# Patient Record
Sex: Female | Born: 1985 | Race: Black or African American | Hispanic: No | Marital: Single | Smoking: Current every day smoker
Health system: Southern US, Community
[De-identification: ages and names within clinical notes are randomized; demographics above are authoritative.]

## PROBLEM LIST (undated history)

## (undated) ENCOUNTER — Emergency Department (HOSPITAL_COMMUNITY): Admission: EM | Payer: Medicaid Other

## (undated) ENCOUNTER — Inpatient Hospital Stay (HOSPITAL_COMMUNITY): Payer: Medicaid Other

## (undated) DIAGNOSIS — R51 Headache: Secondary | ICD-10-CM

## (undated) DIAGNOSIS — F99 Mental disorder, not otherwise specified: Secondary | ICD-10-CM

## (undated) DIAGNOSIS — E282 Polycystic ovarian syndrome: Secondary | ICD-10-CM

## (undated) DIAGNOSIS — F419 Anxiety disorder, unspecified: Secondary | ICD-10-CM

## (undated) DIAGNOSIS — D4959 Neoplasm of unspecified behavior of other genitourinary organ: Secondary | ICD-10-CM

## (undated) DIAGNOSIS — F319 Bipolar disorder, unspecified: Secondary | ICD-10-CM

## (undated) DIAGNOSIS — L0292 Furuncle, unspecified: Secondary | ICD-10-CM

## (undated) HISTORY — PX: LAPAROSCOPIC UNILATERAL SALPINGECTOMY: SHX5934

---
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
## 2006-08-22 ENCOUNTER — Emergency Department (HOSPITAL_COMMUNITY): Admission: EM | Admit: 2006-08-22 | Discharge: 2006-08-22 | Payer: Self-pay | Admitting: Emergency Medicine

## 2006-09-23 ENCOUNTER — Ambulatory Visit: Payer: Self-pay | Admitting: Obstetrics and Gynecology

## 2006-09-23 ENCOUNTER — Inpatient Hospital Stay (HOSPITAL_COMMUNITY): Admission: AD | Admit: 2006-09-23 | Discharge: 2006-09-24 | Payer: Self-pay | Admitting: Gynecology

## 2006-10-22 ENCOUNTER — Encounter (INDEPENDENT_AMBULATORY_CARE_PROVIDER_SITE_OTHER): Payer: Self-pay | Admitting: Gynecology

## 2006-10-22 ENCOUNTER — Ambulatory Visit: Payer: Self-pay | Admitting: Gynecology

## 2006-10-22 ENCOUNTER — Inpatient Hospital Stay (HOSPITAL_COMMUNITY): Admission: RE | Admit: 2006-10-22 | Discharge: 2006-10-24 | Payer: Self-pay | Admitting: Gynecology

## 2006-10-29 ENCOUNTER — Ambulatory Visit: Payer: Self-pay | Admitting: Family Medicine

## 2007-02-09 ENCOUNTER — Inpatient Hospital Stay (HOSPITAL_COMMUNITY): Admission: AD | Admit: 2007-02-09 | Discharge: 2007-02-09 | Payer: Self-pay | Admitting: Obstetrics & Gynecology

## 2007-05-20 ENCOUNTER — Other Ambulatory Visit: Payer: Self-pay | Admitting: Gynecology

## 2007-05-20 ENCOUNTER — Emergency Department (HOSPITAL_COMMUNITY): Admission: EM | Admit: 2007-05-20 | Discharge: 2007-05-20 | Payer: Self-pay | Admitting: Emergency Medicine

## 2007-07-02 ENCOUNTER — Emergency Department (HOSPITAL_COMMUNITY): Admission: EM | Admit: 2007-07-02 | Discharge: 2007-07-02 | Payer: Self-pay | Admitting: Emergency Medicine

## 2009-03-14 ENCOUNTER — Inpatient Hospital Stay (HOSPITAL_COMMUNITY): Admission: AD | Admit: 2009-03-14 | Discharge: 2009-03-14 | Payer: Self-pay | Admitting: Obstetrics and Gynecology

## 2009-06-19 ENCOUNTER — Emergency Department (HOSPITAL_COMMUNITY): Admission: EM | Admit: 2009-06-19 | Discharge: 2009-06-19 | Payer: Self-pay | Admitting: Family Medicine

## 2009-09-11 ENCOUNTER — Inpatient Hospital Stay (HOSPITAL_COMMUNITY): Admission: AD | Admit: 2009-09-11 | Discharge: 2009-09-11 | Payer: Self-pay | Admitting: Obstetrics & Gynecology

## 2009-09-11 ENCOUNTER — Ambulatory Visit: Payer: Self-pay | Admitting: Nurse Practitioner

## 2009-09-24 ENCOUNTER — Emergency Department (HOSPITAL_BASED_OUTPATIENT_CLINIC_OR_DEPARTMENT_OTHER): Admission: EM | Admit: 2009-09-24 | Discharge: 2009-09-24 | Payer: Self-pay | Admitting: Emergency Medicine

## 2009-12-18 ENCOUNTER — Emergency Department (HOSPITAL_COMMUNITY): Admission: AD | Admit: 2009-12-18 | Discharge: 2009-12-18 | Payer: Self-pay | Admitting: Emergency Medicine

## 2009-12-19 ENCOUNTER — Emergency Department (HOSPITAL_COMMUNITY): Admission: EM | Admit: 2009-12-19 | Discharge: 2009-12-19 | Payer: Self-pay | Admitting: Family Medicine

## 2010-03-24 ENCOUNTER — Encounter: Payer: Self-pay | Admitting: *Deleted

## 2010-05-18 LAB — URINALYSIS, ROUTINE W REFLEX MICROSCOPIC
Glucose, UA: NEGATIVE mg/dL
Ketones, ur: NEGATIVE mg/dL
Nitrite: NEGATIVE
Specific Gravity, Urine: 1.007 (ref 1.005–1.030)
pH: 7 (ref 5.0–8.0)

## 2010-05-18 LAB — POCT TOXICOLOGY PANEL
Cocaine: POSITIVE
Tetrahydrocannabinol: POSITIVE

## 2010-05-18 LAB — COMPREHENSIVE METABOLIC PANEL
Albumin: 4.1 g/dL (ref 3.5–5.2)
BUN: 6 mg/dL (ref 6–23)
Calcium: 9.3 mg/dL (ref 8.4–10.5)
Creatinine, Ser: 1 mg/dL (ref 0.4–1.2)
Glucose, Bld: 94 mg/dL (ref 70–99)
Potassium: 3.9 mEq/L (ref 3.5–5.1)
Total Protein: 8.1 g/dL (ref 6.0–8.3)

## 2010-05-18 LAB — CBC
HCT: 50.2 % — ABNORMAL HIGH (ref 36.0–46.0)
MCH: 32.7 pg (ref 26.0–34.0)
MCHC: 33.8 g/dL (ref 30.0–36.0)
MCV: 96.8 fL (ref 78.0–100.0)
Platelets: 237 10*3/uL (ref 150–400)
RDW: 11.9 % (ref 11.5–15.5)
WBC: 6.3 10*3/uL (ref 4.0–10.5)

## 2010-05-18 LAB — PREGNANCY, URINE: Preg Test, Ur: NEGATIVE

## 2010-05-18 LAB — WET PREP, GENITAL: Trich, Wet Prep: NONE SEEN

## 2010-05-18 LAB — GC/CHLAMYDIA PROBE AMP, GENITAL: Chlamydia, DNA Probe: NEGATIVE

## 2010-05-19 LAB — URINE MICROSCOPIC-ADD ON

## 2010-05-19 LAB — URINALYSIS, ROUTINE W REFLEX MICROSCOPIC
Bilirubin Urine: NEGATIVE
Glucose, UA: NEGATIVE mg/dL
Ketones, ur: NEGATIVE mg/dL
Protein, ur: NEGATIVE mg/dL
pH: 6 (ref 5.0–8.0)

## 2010-05-19 LAB — GC/CHLAMYDIA PROBE AMP, GENITAL
Chlamydia, DNA Probe: NEGATIVE
GC Probe Amp, Genital: NEGATIVE

## 2010-05-19 LAB — WET PREP, GENITAL: Yeast Wet Prep HPF POC: NONE SEEN

## 2010-06-04 ENCOUNTER — Inpatient Hospital Stay (INDEPENDENT_AMBULATORY_CARE_PROVIDER_SITE_OTHER)
Admission: RE | Admit: 2010-06-04 | Discharge: 2010-06-04 | Disposition: A | Discharge status: Home / Self Care | Payer: Self-pay | Source: Ambulatory Visit | Attending: Family Medicine | Admitting: Family Medicine

## 2010-06-04 DIAGNOSIS — M545 Low back pain: Secondary | ICD-10-CM

## 2010-06-04 DIAGNOSIS — N760 Acute vaginitis: Secondary | ICD-10-CM

## 2010-06-04 LAB — POCT URINALYSIS DIP (DEVICE)
Bilirubin Urine: NEGATIVE
Glucose, UA: NEGATIVE mg/dL
Hgb urine dipstick: NEGATIVE
Ketones, ur: NEGATIVE mg/dL
Specific Gravity, Urine: 1.025 (ref 1.005–1.030)
pH: 6 (ref 5.0–8.0)

## 2010-06-04 LAB — WET PREP, GENITAL
Trich, Wet Prep: NONE SEEN
Yeast Wet Prep HPF POC: NONE SEEN

## 2010-06-05 LAB — GC/CHLAMYDIA PROBE AMP, GENITAL
Chlamydia, DNA Probe: NEGATIVE
GC Probe Amp, Genital: NEGATIVE

## 2010-07-16 NOTE — Discharge Summary (Signed)
NAME:  [name] [name], [name] [name]                 ACCOUNT NO.:  [id number]   MEDICAL RECORD NO.:  [id number]          PATIENT TYPE:  INP   LOCATION:  9314                          FACILITY:  [facility]   PHYSICIAN:  Ginger Carne, MD  DATE OF BIRTH:  June 25, 1985   DATE OF ADMISSION:  10/22/2006  DATE OF DISCHARGE:  10/24/2006                               DISCHARGE SUMMARY   REASON FOR HOSPITALIZATION:  Pelvic mass.   IN-HOSPITAL PROCEDURE:  Removal of left paraovarian cyst.   FINAL DIAGNOSIS:  Left paraovarian cyst.   HOSPITAL COURSE:  This is a 25 year old African American female who  underwent a laparotomy and removal of a large left paraovarian cyst,  3,100 mL of clear fluid was removed from said cyst by aspiration and it  extended above the umbilicus.  Cell block and cytology including  peritoneal washings as well as aspirate and the left paraovarian cyst  were sent to pathology.  Both tubes and ovaries appeared normal as did  the uterus.  There was no evidence of chronic pelvic inflammatory  disease or endometriosis noted.  The patient's hospital course was  uneventful.  Her hemoglobin was 12.8 postoperatively.  She was afebrile,  voided well, incision dry, calves without tenderness, lungs were clear.   The patient was discharged with routine postoperative instructions  including contacting the office for temperature elevation above 100.4  degrees Fahrenheit, increasing abdominal pain, incisional drainage,  redness, or pain, GI or GU complaints.  The patient was prescribed  Darvocet-N 100 1-2 every four to six hours and will return in 5 days to  the GYN clinic to have her staples removed.  All questions answered to  the satisfaction of said patient and patient verbalized understanding of  same.      Ginger Carne, MD  Electronically Signed     SHB/MEDQ  D:  10/24/2006  T:  10/25/2006  Job:  [id number]

## 2010-07-16 NOTE — H&P (Signed)
NAME:  [name] [name]                 ACCOUNT NO.:  [id number]   MEDICAL RECORD NO.:  [id number]          PATIENT TYPE:  INP   LOCATION:  9302                          FACILITY:  [facility]   PHYSICIAN:  Tilda Burrow, M.D. DATE OF BIRTH:  [birth year]   DATE OF ADMISSION:  09/23/2006  DATE OF DISCHARGE:                              HISTORY & PHYSICAL   ADMISSION DIAGNOSES:  A 23 cm symptomatic right ovarian mass admitted  for pain management and probable surgery.   HISTORY OF PRESENT ILLNESS:  This 25 year old female gravida 0, para 0,  with longstanding history of ovarian cysts and OB history of polycystic  ovaries in the past, is admitted at this time for pain management and  probable surgery for a large 23 cm cyst.  She was seen many years ago by  Dr. Gaynell Face, no longer considered a patient here.  She has been living  in [place] with her mother where ultrasound and CT scan in January  showed an 18 cm cyst.  Four months ago she moved here.  She presents  tonight due to acute increase in the pain to the point that she couldn't  tolerate it.  She describes the pain as a 10 out of 10.  The pain has  been gradual in onset, progressing over the last week or so.  She has  had multiple episodes of this sort of pain.  Tonight's ultrasound shows  a cystic mass which is considered to be likely benign in character based  on ultrasound.  A CA-125 is pending.  The severity of her discomfort  with contact raises the question of partial torsion but the symptoms are  only partially relief with IV Toradol.  She is admitted for pain  management and probable surgery once work up is completed.   PAST MEDICAL HISTORY:  Bipolar disorder.   PAST SURGICAL HISTORY:  Negative other than some tooth extractions.   GYN HISTORY:  Single sexual partner at the present.  She has had a  history of STD's in the past.  Gonorrhea and Chlamydia have been tested  tonight.   SOCIAL HISTORY:  Smoker, 1/4th pack per day.   Rare alcohol drinker.  Regular marijuana use with more marijuana smoked than cigarettes.  She  has a history of cocaine use within the past month and claims to have  quit using ecstasy but reports last use was only one month ago.  The  patient specifically denies IV drug use or sexual partners using IV  drugs.  Contraception method is condoms.  Her mother, [name] [name],  cell phone [phone] lives in [place] and [place], Serena Colonel is  support person here in town.  She lives with her grandmother here at the  current time.   PHYSICAL EXAMINATION:  GENERAL APPEARANCE:  Shows a well-developed,  uncomfortable, tearful appearing African-American female.  HEENT:  Normocephalic. Pupils equal, round, reactive.  NECK:  Supple.  CHEST:  Clear to auscultation.  ABDOMEN:  Filled with large, smooth cystic mass.  There is no suggestion  of fluid wave.  Ultrasound does not identify ascites.  PELVIC:  Bimanual exam shows normal introitus, nulliparous introitus  with cervix very posteriorly oriented and uterus pushed posteriorly by a  large smooth mass effect which cannot be determined whether it is right  sided or left sided due to its marked size.  The mass rests from the  pelvic brim and extends upward into the upper abdomen.  There is no  inguinal adenopathy.  EXTREMITIES:  Are without cyanosis, clubbing or edema.   CLINICAL DATA:  Gonorrhea and Chlamydia cultures are performed.   IMPRESSION:  1. Longstanding simple cystic adnexal mass, 23 cm.  2. Desire for future childbearing.  3. Recent history of sexually transmitted diseases.  4. Recent history of drug use.   PLAN:  Admit for pain management and completion of work up with CA-125  pending.  Will give consideration to proceeding to laparotomy with  frozen section and consideration of uterine preservation and assessment  of opposite adnexa as clinical findings allow.  Will discuss with  teaching service rounds in A.M.      Tilda Burrow, M.D.  Electronically Signed     JVF/MEDQ  D:  09/23/2006  T:  09/24/2006  Job:  [id number]   cc:   Teaching Service, not [facility]

## 2010-07-16 NOTE — Op Note (Signed)
NAME:  [name] [name], [name] [name]                 ACCOUNT NO.:  [id number]   MEDICAL RECORD NO.:  [id number]          PATIENT TYPE:  INP   LOCATION:  9314                          FACILITY:  [facility]   PHYSICIAN:  Ginger Carne, MD  DATE OF BIRTH:  18-May-1985   DATE OF PROCEDURE:  10/22/2006  DATE OF DISCHARGE:                               OPERATIVE REPORT   PREOPERATIVE DIAGNOSIS:  Pelvic mass.   POSTOPERATIVE DIAGNOSIS:  Left paraovarian cyst.   PROCEDURE:  Excision of left paraovarian cyst.   SURGEON:  Ginger Carne, M.D.   ASSISTANT:  None.   COMPLICATIONS:  None immediate.   ESTIMATED BLOOD LOSS:  Minimal.   OPERATIVE FINDINGS:  A large mass noted in the pelvis which extended  above the umbilicus to the ligament of Treitz. At aspiration it  contained 3100 mL was clear fluid.  The anterior and exterior of said  mass were free of implants or other identifying features of carcinoma.  The mass extended adjacent to the left ovary and tube was incorporated  in same.  The left ovary and right ovary appeared normal as did the  right tube. Uterus was normal in size.  There was no evidence of  excrescences or lesions in the pelvis or upper abdomen.  Cell block and  cytology of aspirate in addition to removal of left paraovarian cyst was  sent for specimen and pathology.  The upper abdomen demonstrated no  evidence of any lesions or abnormalities.  Large and small bowel grossly  normal.  Peritoneal surface was normal.   SPECIMEN:  1. Cell block and cytology of irrigant.  2. Aspirate cell block and cytology.  3. Left paraovarian cyst.   OPERATIVE PROCEDURE:  The patient prepped and draped in usual fashion  and placed in supine position.  Betadine solution used for antiseptic  and the patient was catheterized prior to procedure.  After adequate  general anesthesia a vertical infraumbilical incision was made and the  abdomen opened.  Appropriate irrigation with cell block and cytology to  remove irrigant was performed. Due to the size of mass in appearance of  being benign, a small incision was made in the mass and said fluid was  irrigated into canisters.  Following this the mass was easily brought  into the operative field where its attachment to the mesosalpinx was  identified, clamps placed on the same and the mass was removed.  0  Vicryl suture in transfixation manner was utilized for hemostasis.  Bleeding points  hemostatically checked.  Blood clots removed.  Closure of the fascia one  layers of 0 Vicryl running suture, 2-0 Vicryl subcutaneous layer and  skin staples for skin.  Instrument and sponge count were correct.  The  patient tolerated procedure well, returned to post anesthesia recovery  room in excellent condition.      Ginger Carne, MD  Electronically Signed     SHB/MEDQ  D:  10/22/2006  T:  10/22/2006  Job:  [id number]

## 2010-08-24 ENCOUNTER — Inpatient Hospital Stay (INDEPENDENT_AMBULATORY_CARE_PROVIDER_SITE_OTHER)
Admission: RE | Admit: 2010-08-24 | Discharge: 2010-08-24 | Disposition: A | Discharge status: Home / Self Care | Payer: Self-pay | Source: Ambulatory Visit | Attending: Family Medicine | Admitting: Family Medicine

## 2010-08-24 DIAGNOSIS — A499 Bacterial infection, unspecified: Secondary | ICD-10-CM

## 2010-08-24 DIAGNOSIS — N760 Acute vaginitis: Secondary | ICD-10-CM

## 2010-08-24 LAB — POCT URINALYSIS DIP (DEVICE)
Ketones, ur: NEGATIVE mg/dL
Leukocytes, UA: NEGATIVE
Nitrite: NEGATIVE
Protein, ur: NEGATIVE mg/dL
Urobilinogen, UA: 1 mg/dL (ref 0.0–1.0)
pH: 7 (ref 5.0–8.0)

## 2010-08-24 LAB — WET PREP, GENITAL

## 2010-09-27 ENCOUNTER — Inpatient Hospital Stay (HOSPITAL_COMMUNITY)
Admission: AD | Admit: 2010-09-27 | Discharge: 2010-09-27 | Disposition: A | Discharge status: Home / Self Care | Payer: Self-pay | Source: Ambulatory Visit | Attending: Obstetrics & Gynecology | Admitting: Obstetrics & Gynecology

## 2010-09-27 ENCOUNTER — Encounter (HOSPITAL_COMMUNITY): Payer: Self-pay | Admitting: *Deleted

## 2010-09-27 DIAGNOSIS — Z87891 Personal history of nicotine dependence: Secondary | ICD-10-CM | POA: Insufficient documentation

## 2010-09-27 DIAGNOSIS — R1907 Generalized intra-abdominal and pelvic swelling, mass and lump: Secondary | ICD-10-CM | POA: Insufficient documentation

## 2010-09-27 HISTORY — DX: Headache: R51

## 2010-09-27 HISTORY — DX: Furuncle, unspecified: L02.92

## 2010-09-27 HISTORY — DX: Neoplasm of unspecified behavior of other genitourinary organ: D49.59

## 2010-09-27 LAB — URINALYSIS, ROUTINE W REFLEX MICROSCOPIC
Glucose, UA: NEGATIVE mg/dL
Leukocytes, UA: NEGATIVE
Specific Gravity, Urine: 1.025 (ref 1.005–1.030)
pH: 6 (ref 5.0–8.0)

## 2010-09-27 LAB — CBC
HCT: 39.8 % (ref 36.0–46.0)
Hemoglobin: 14 g/dL (ref 12.0–15.0)
RBC: 4.28 MIL/uL (ref 3.87–5.11)
RDW: 12.3 % (ref 11.5–15.5)
WBC: 5.5 10*3/uL (ref 4.0–10.5)

## 2010-09-27 NOTE — Progress Notes (Signed)
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

## 2010-09-27 NOTE — Progress Notes (Signed)
abd is getting bigger, neg test a few wks ago at [facility].  Hx of "tumor removed"- 8# attached to ovary, ? 2008.  BP has been up, head is swimming.

## 2010-09-27 NOTE — Progress Notes (Signed)
Pelvic only

## 2010-09-27 NOTE — ED Provider Notes (Signed)
History   Pt presents today c/o abd swelling. She states she had a negative preg test at [facility] ED but she "feels like her belly is getting big for no reason." She denies vag dc or bleeding. She has abd pain that comes and goes but denies any pain at this time. She denies fever or any other sx.  Chief Complaint  Patient presents with  . Bloated   HPI  OB History    Grav Para Term Preterm Abortions TAB SAB Ect Mult Living   0               Past Medical History  Diagnosis Date  . Tumor of ovary   . Depression   . Headache   . Boils     Past Surgical History  Procedure Date  . Tumor removal     No family history on file.  History  Substance Use Topics  . Smoking status: Former Games developer  . Smokeless tobacco: Not on file  . Alcohol Use: No    Allergies: No Known Allergies  Prescriptions prior to admission  Medication Sig Dispense Refill  . sulfamethoxazole-trimethoprim (BACTRIM DS) 800-160 MG per tablet Take 2 tablets by mouth 2 (two) times daily. Pt to take for 7 days. Finished about 3-4 days ago         Review of Systems  Constitutional: Negative for fever.  Cardiovascular: Negative for chest pain and palpitations.  Gastrointestinal: Negative for nausea, vomiting, abdominal pain, diarrhea and constipation.  Genitourinary: Negative for dysuria, urgency, frequency and hematuria.  Neurological: Negative for dizziness and headaches.  Psychiatric/Behavioral: Negative for depression and suicidal ideas.   Physical Exam   Blood pressure 124/66, pulse 65, temperature 97.9 F (36.6 C), temperature source Oral, resp. rate 18, height 5\' 11"  (1.803 m), weight 202 lb 12.8 oz (91.989 kg), last menstrual period 08/28/2010.  Physical Exam  Constitutional: She is oriented to person, place, and time. She appears well-developed and well-nourished. No distress.  HENT:  Head: Normocephalic and atraumatic.  Eyes: EOM are normal. Pupils are equal, round, and reactive to light.  GI:  Soft. She exhibits no distension and no mass. There is no tenderness. There is no rebound and no guarding.  Genitourinary: Vagina normal and uterus normal. No vaginal discharge found.  Neurological: She is alert and oriented to person, place, and time.  Skin: Skin is warm and dry. She is not diaphoretic.  Psychiatric: She has a normal mood and affect. Her behavior is normal. Judgment and thought content normal.    MAU Course  Procedures  Results for orders placed during the hospital encounter of 09/27/10 (from the past 24 hour(s))  POCT PREGNANCY, URINE     Status: Normal   Collection Time   09/27/10 11:03 AM      Component Value Range   Preg Test, Ur NEGATIVE    URINALYSIS, ROUTINE W REFLEX MICROSCOPIC     Status: Normal   Collection Time   09/27/10 11:50 AM      Component Value Range   Color, Urine YELLOW  YELLOW    Appearance CLEAR  CLEAR    Specific Gravity, Urine 1.025  1.005 - 1.030    pH 6.0  5.0 - 8.0    Glucose, UA NEGATIVE  NEGATIVE (mg/dL)   Hgb urine dipstick NEGATIVE  NEGATIVE    Bilirubin Urine NEGATIVE  NEGATIVE    Ketones, ur NEGATIVE  NEGATIVE (mg/dL)   Protein, ur NEGATIVE  NEGATIVE (mg/dL)   Urobilinogen, UA  0.2  0.0 - 1.0 (mg/dL)   Nitrite NEGATIVE  NEGATIVE    Leukocytes, UA NEGATIVE  NEGATIVE   CBC     Status: Normal   Collection Time   09/27/10 12:16 PM      Component Value Range   WBC 5.5  4.0 - 10.5 (K/uL)   RBC 4.28  3.87 - 5.11 (MIL/uL)   Hemoglobin 14.0  12.0 - 15.0 (g/dL)   HCT 16.1  09.6 - 04.5 (%)   MCV 93.0  78.0 - 100.0 (fL)   MCH 32.7  26.0 - 34.0 (pg)   MCHC 35.2  30.0 - 36.0 (g/dL)   RDW 40.9  81.1 - 91.4 (%)   Platelets 234  150 - 400 (K/uL)     Assessment and Plan  Normal exam: discusssed with pt at length. Discussed appt diet. She will f/u with her PCP. Discussed diet, activity, risks, and precautions.  Clinton Gallant. Natasja Niday III, DrHSc, MPAS, PA-C  09/27/2010, 12:42 PM   Henrietta Hoover, PA 09/27/10 1304

## 2010-11-25 LAB — POCT PREGNANCY, URINE
Operator id: 22226
Preg Test, Ur: NEGATIVE

## 2010-11-25 LAB — URINALYSIS, ROUTINE W REFLEX MICROSCOPIC
Glucose, UA: NEGATIVE
Hgb urine dipstick: NEGATIVE
Specific Gravity, Urine: <1.005 — ABNORMAL LOW
Urobilinogen, UA: 0.2

## 2010-11-27 LAB — COMPREHENSIVE METABOLIC PANEL WITH GFR
BUN: 9
CO2: 25
Calcium: 9.3
Chloride: 103
Creatinine, Ser: 0.93
GFR calc Af Amer: >60
GFR calc non Af Amer: >60
Total Bilirubin: 0.6

## 2010-11-27 LAB — DIFFERENTIAL
Basophils Absolute: 0
Basophils Relative: 0
Eosinophils Absolute: 0.2
Eosinophils Relative: 3
Lymphocytes Relative: 25
Lymphs Abs: 1.6
Monocytes Absolute: 0.5
Monocytes Relative: 8
Neutro Abs: 4.1
Neutrophils Relative %: 64

## 2010-11-27 LAB — CBC
HCT: 37.5
Hemoglobin: 13.4
MCHC: 35.8
MCV: 93.4
Platelets: 274
RBC: 4.01
RDW: 12.7
WBC: 6.5

## 2010-11-27 LAB — URINALYSIS, ROUTINE W REFLEX MICROSCOPIC
Bilirubin Urine: NEGATIVE
Glucose, UA: NEGATIVE
Hgb urine dipstick: NEGATIVE
Ketones, ur: NEGATIVE
Nitrite: NEGATIVE
Protein, ur: NEGATIVE
Specific Gravity, Urine: 1.014
Urobilinogen, UA: 1
pH: 6.5

## 2010-11-27 LAB — COMPREHENSIVE METABOLIC PANEL
ALT: 17
AST: 20
Albumin: 3.6
Alkaline Phosphatase: 64
Glucose, Bld: 107 — ABNORMAL HIGH
Potassium: 3.6
Sodium: 138
Total Protein: 6.6

## 2010-11-27 LAB — LIPASE, BLOOD: Lipase: 20

## 2010-11-27 LAB — POCT PREGNANCY, URINE
Operator id: 234501
Preg Test, Ur: NEGATIVE

## 2010-12-05 ENCOUNTER — Inpatient Hospital Stay (HOSPITAL_COMMUNITY)
Admission: RE | Admit: 2010-12-05 | Discharge: 2010-12-05 | Disposition: A | Discharge status: Home / Self Care | Payer: Self-pay | Source: Ambulatory Visit | Attending: Emergency Medicine | Admitting: Emergency Medicine

## 2010-12-06 ENCOUNTER — Inpatient Hospital Stay (INDEPENDENT_AMBULATORY_CARE_PROVIDER_SITE_OTHER)
Admission: RE | Admit: 2010-12-06 | Discharge: 2010-12-06 | Disposition: A | Discharge status: Home / Self Care | Payer: Self-pay | Source: Ambulatory Visit | Attending: Emergency Medicine | Admitting: Emergency Medicine

## 2010-12-06 DIAGNOSIS — N760 Acute vaginitis: Secondary | ICD-10-CM

## 2010-12-06 DIAGNOSIS — A499 Bacterial infection, unspecified: Secondary | ICD-10-CM

## 2010-12-06 LAB — POCT URINALYSIS DIP (DEVICE)
Bilirubin Urine: NEGATIVE
Glucose, UA: NEGATIVE mg/dL
Hgb urine dipstick: NEGATIVE
Ketones, ur: NEGATIVE mg/dL
Leukocytes, UA: NEGATIVE
pH: 6.5 (ref 5.0–8.0)

## 2010-12-06 LAB — WET PREP, GENITAL
Trich, Wet Prep: NONE SEEN
Yeast Wet Prep HPF POC: NONE SEEN

## 2010-12-07 LAB — GC/CHLAMYDIA PROBE AMP, GENITAL
Chlamydia, DNA Probe: NEGATIVE
GC Probe Amp, Genital: NEGATIVE

## 2010-12-09 LAB — URINALYSIS, ROUTINE W REFLEX MICROSCOPIC
Bilirubin Urine: NEGATIVE
Hgb urine dipstick: NEGATIVE
Ketones, ur: NEGATIVE
Nitrite: NEGATIVE
pH: 6

## 2010-12-09 LAB — POCT PREGNANCY, URINE: Operator id: 13344

## 2010-12-13 LAB — TYPE AND SCREEN

## 2010-12-13 LAB — CBC
HCT: 36.3
HCT: 39.7
Hemoglobin: 12.8
Hemoglobin: 13.8
MCV: 93.3
Platelets: 251
RBC: 3.91
WBC: 5.7
WBC: 9

## 2010-12-13 LAB — HCG, SERUM, QUALITATIVE: Preg, Serum: NEGATIVE

## 2010-12-16 LAB — COMPREHENSIVE METABOLIC PANEL
BUN: 4 — ABNORMAL LOW
CO2: 25
Calcium: 9.1
Chloride: 111
Creatinine, Ser: 0.78
GFR calc non Af Amer: >60
Total Bilirubin: 0.5

## 2010-12-16 LAB — CBC
HCT: 38.6
Hemoglobin: 13.4
RDW: 12.6
WBC: 6.9

## 2010-12-16 LAB — URINALYSIS, ROUTINE W REFLEX MICROSCOPIC
Bilirubin Urine: NEGATIVE
Glucose, UA: NEGATIVE
Hgb urine dipstick: NEGATIVE
Specific Gravity, Urine: >1.03 — ABNORMAL HIGH
Urobilinogen, UA: 0.2
pH: 6

## 2010-12-16 LAB — WET PREP, GENITAL: Trich, Wet Prep: NONE SEEN

## 2010-12-16 LAB — RAPID URINE DRUG SCREEN, HOSP PERFORMED
Barbiturates: NOT DETECTED
Cocaine: POSITIVE — AB
Opiates: NOT DETECTED

## 2010-12-16 LAB — GC/CHLAMYDIA PROBE AMP, GENITAL
Chlamydia, DNA Probe: NEGATIVE
GC Probe Amp, Genital: NEGATIVE

## 2010-12-16 LAB — CA 125: CA 125: 9

## 2011-01-27 ENCOUNTER — Emergency Department (HOSPITAL_COMMUNITY)
Admission: EM | Admit: 2011-01-27 | Discharge: 2011-01-27 | Disposition: A | Discharge status: Home / Self Care | Payer: Self-pay | Attending: Emergency Medicine | Admitting: Emergency Medicine

## 2011-01-27 ENCOUNTER — Encounter (HOSPITAL_COMMUNITY): Payer: Self-pay | Admitting: *Deleted

## 2011-01-27 DIAGNOSIS — F3289 Other specified depressive episodes: Secondary | ICD-10-CM | POA: Insufficient documentation

## 2011-01-27 DIAGNOSIS — N644 Mastodynia: Secondary | ICD-10-CM | POA: Insufficient documentation

## 2011-01-27 DIAGNOSIS — R079 Chest pain, unspecified: Secondary | ICD-10-CM | POA: Insufficient documentation

## 2011-01-27 DIAGNOSIS — IMO0002 Reserved for concepts with insufficient information to code with codable children: Secondary | ICD-10-CM | POA: Insufficient documentation

## 2011-01-27 DIAGNOSIS — F329 Major depressive disorder, single episode, unspecified: Secondary | ICD-10-CM | POA: Insufficient documentation

## 2011-01-27 MED ORDER — HYDROCODONE-ACETAMINOPHEN 5-325 MG PO TABS: 2.0000 | ORAL_TABLET | ORAL | Status: AC | PRN

## 2011-01-27 NOTE — ED Provider Notes (Signed)
Medical screening examination/treatment/procedure(s) were performed by non-physician practitioner and as supervising physician I was immediately available for consultation/collaboration.    Ryver Zadrozny R Bayli Quesinberry, MD 01/27/11 1644 

## 2011-01-27 NOTE — ED Notes (Signed)
Reports right breast pain for  Months, denies an abscess or wound. No distress noted at triage.

## 2011-01-27 NOTE — ED Provider Notes (Signed)
History     CSN: [id number] Arrival date & time: 01/27/2011  8:58 AM   First MD Initiated Contact with Patient 01/27/11 1001      Chief Complaint  Patient presents with  . Breast Pain    (Consider location/radiation/quality/duration/timing/severity/associated sxs/prior treatment) Patient is a 25 y.o. female presenting with chest pain. The history is provided by the patient.  Chest Pain The chest pain began more  than 1 month ago. Chest pain occurs constantly. The chest pain is worsening. At its most intense, the pain is at 7/10. The pain is currently at 7/10. The severity of the pain is moderate. The quality of the pain is described as aching. The pain does not radiate. Chest pain is worsened by certain positions. Pertinent negatives for primary symptoms include no fever, no shortness of breath, no cough, no palpitations, no abdominal pain, no nausea and no vomiting. She tried nothing for the symptoms.   Pt states pain is really in the right breast, comes and goes. Nothing makes it better. Tender. No erythema, swelling, no masses, no nipple discharge. No injury.   Past Medical History  Diagnosis Date  . Tumor of ovary   . Depression   . Headache   . Boils     Past Surgical History  Procedure Date  . Tumor removal     History reviewed. No pertinent family history.  History  Substance Use Topics  . Smoking status: Former Games developer  . Smokeless tobacco: Not on file  . Alcohol Use: No    OB History    Grav Para Term Preterm Abortions TAB SAB Ect Mult Living   0               Review of Systems  Constitutional: Negative.  Negative for fever.  HENT: Negative.   Eyes: Negative.   Respiratory: Negative for cough, chest tightness and shortness of breath.   Cardiovascular: Negative for chest pain and palpitations.       Breast pain  Gastrointestinal: Negative for nausea, vomiting and abdominal pain.  Genitourinary: Negative.   Musculoskeletal: Negative.   Neurological:  Negative.   Psychiatric/Behavioral: Positive for agitation.    Allergies  Review of patient's allergies indicates no known allergies.  Home Medications   Current Outpatient Rx  Name Route Sig Dispense Refill  . HYDROCODONE-ACETAMINOPHEN 5-325 MG PO TABS Oral Take 2 tablets by mouth every 4 (four) hours as needed for pain. 10 tablet 0    BP 116/66  Pulse 63  Temp(Src) 97.8 F (36.6 C) (Oral)  Resp 20  SpO2 99%  LMP 01/14/2011  Physical Exam  ED Course  Procedures (including critical care time)  Urine pregnancy negative.  No masses noted on breast exam. No signs of infection. Pain on and off. Suspect most likely hormonal, however, pt does have a history of breast cancer in two members of immediate family. Will follow up with GYN for further evaluation.   1. Breast pain in female       MDM          Lottie Mussel, PA 01/27/11 1614

## 2011-10-27 ENCOUNTER — Inpatient Hospital Stay (HOSPITAL_COMMUNITY): Payer: Self-pay

## 2011-10-27 ENCOUNTER — Inpatient Hospital Stay (HOSPITAL_COMMUNITY)
Admission: AD | Admit: 2011-10-27 | Discharge: 2011-10-27 | Disposition: A | Discharge status: Home / Self Care | Payer: Self-pay | Source: Ambulatory Visit | Attending: Obstetrics & Gynecology | Admitting: Obstetrics & Gynecology

## 2011-10-27 ENCOUNTER — Encounter (HOSPITAL_COMMUNITY): Payer: Self-pay | Admitting: *Deleted

## 2011-10-27 DIAGNOSIS — A499 Bacterial infection, unspecified: Secondary | ICD-10-CM | POA: Insufficient documentation

## 2011-10-27 DIAGNOSIS — E282 Polycystic ovarian syndrome: Secondary | ICD-10-CM

## 2011-10-27 DIAGNOSIS — B9689 Other specified bacterial agents as the cause of diseases classified elsewhere: Secondary | ICD-10-CM | POA: Insufficient documentation

## 2011-10-27 DIAGNOSIS — R109 Unspecified abdominal pain: Secondary | ICD-10-CM | POA: Insufficient documentation

## 2011-10-27 DIAGNOSIS — N760 Acute vaginitis: Secondary | ICD-10-CM | POA: Insufficient documentation

## 2011-10-27 LAB — CBC WITH DIFFERENTIAL/PLATELET
Eosinophils Absolute: 0.2 10*3/uL (ref 0.0–0.7)
Eosinophils Relative: 4 % (ref 0–5)
Lymphs Abs: 1.9 10*3/uL (ref 0.7–4.0)
MCH: 31.7 pg (ref 26.0–34.0)
MCHC: 33.5 g/dL (ref 30.0–36.0)
MCV: 94.5 fL (ref 78.0–100.0)
Platelets: 218 10*3/uL (ref 150–400)
RBC: 4.36 MIL/uL (ref 3.87–5.11)
RDW: 12.8 % (ref 11.5–15.5)

## 2011-10-27 LAB — URINALYSIS, ROUTINE W REFLEX MICROSCOPIC
Glucose, UA: NEGATIVE mg/dL
Hgb urine dipstick: NEGATIVE
Specific Gravity, Urine: 1.025 (ref 1.005–1.030)
pH: 5.5 (ref 5.0–8.0)

## 2011-10-27 LAB — POCT PREGNANCY, URINE: Preg Test, Ur: NEGATIVE

## 2011-10-27 LAB — WET PREP, GENITAL
Trich, Wet Prep: NONE SEEN
Yeast Wet Prep HPF POC: NONE SEEN

## 2011-10-27 MED ORDER — METRONIDAZOLE 500 MG PO TABS: 500.0000 mg | ORAL_TABLET | Freq: Two times a day (BID) | ORAL | Status: AC

## 2011-10-27 MED ORDER — OXYCODONE-ACETAMINOPHEN 5-325 MG PO TABS: 1.0000 | ORAL_TABLET | Freq: Four times a day (QID) | ORAL | Status: AC | PRN

## 2011-10-27 NOTE — MAU Note (Signed)
Having pain in lower abd and back- started about a wk ago.hx of 27cm tumor removed in 2002.  Dx with PCOS.  Has done 2 preg tests- one was positive, one was negative.  On Sat, was having sex- lost feeling in both legs, hips down.  Went home- eventually feeling came back.

## 2011-10-27 NOTE — MAU Note (Signed)
?   LMP before birthday, after Christmas- think was in Feb

## 2011-10-27 NOTE — MAU Note (Signed)
Pt presents for abdominal pain/swelling that is uncomfortable but not painful at this time.  States having both a positive and negative pregnancy test at home.  Pt has had a "tumor" removed at the age of 19, and is concerned because she is having the same symptoms.  She also has PCOS.

## 2011-10-27 NOTE — MAU Provider Note (Signed)
History     CSN: [id number]  Arrival date and time: 10/27/11 4098   First Provider Initiated Contact with Patient 10/27/11 1110      Chief Complaint  Patient presents with  . Abdominal Pain   HPI [name] [name] is 26 y.o. G0P0 Unknown weeks presenting with lower abdominal and back pain X 1 week.  Worsens with standing, after intercourse.  Has taken Phenergan for the pain so that she will go to sleep.  Sexually active X 1 partner.  Planning pregnancy.  Hx of PCOS--hx of tumor on ovary age 75--removed by Dr. Marice Potter 2002.   She thinks her last period was February--not unusual for her.      Past Medical History  Diagnosis Date  . Tumor of ovary   . Depression   . Headache   . Boils   . Bipolar 1 disorder, depressed stated by patient    Past Surgical History  Procedure Date  . Tumor removal     History reviewed. No pertinent family history.  History  Substance Use Topics  . Smoking status: Former Games developer  . Smokeless tobacco: Not on file  . Alcohol Use: No    Allergies:  Allergies  Allergen Reactions  . Ibuprofen Other (See Comments)    Stomach upset    Prescriptions prior to admission  Medication Sig Dispense Refill  . promethazine (PHENERGAN) 25 MG tablet Take 12.5 mg by mouth daily as needed. For nausea, from migraines      . pseudoephedrine (SUDAFED) 120 MG 12 hr tablet Take 120 mg by mouth every 12 (twelve) hours as needed. For stuffy nose        Review of Systems  Constitutional: Negative.   HENT: Negative.   Respiratory: Negative.   Cardiovascular: Negative.   Gastrointestinal: Positive for abdominal pain (right sided pain). Negative for nausea and vomiting.  Genitourinary: Negative.        Neg for bleeding or abnormal discharge  Musculoskeletal: Positive for back pain.  Neurological: Negative.    Physical Exam   Blood pressure 120/76, pulse 64, temperature 98.7 F (37.1 C), temperature source Oral, resp. rate 20, weight 100.699 kg (222  lb).  Physical Exam  Constitutional: She is oriented to person, place, and time. She appears well-developed and well-nourished. No distress.  HENT:  Head: Normocephalic.  Neck: Normal range of motion.  Cardiovascular: Normal rate.   Respiratory: Effort normal.  GI: Soft. She exhibits no distension and no mass. There is tenderness (lower right sided pain with palpation). There is no rebound and no guarding.  Genitourinary: Uterus is not enlarged and not tender. Cervix exhibits no motion tenderness and no discharge. Right adnexum displays tenderness and fullness. Right adnexum displays no mass. Left adnexum displays no mass, no tenderness and no fullness. No bleeding around the vagina. Vaginal discharge (frothy discharge with odor) found.  Neurological: She is alert and oriented to person, place, and time.  Skin: Skin is warm and dry.  Psychiatric: She has a normal mood and affect. Her behavior is normal.   Results for orders placed during the hospital encounter of 10/27/11 (from the past 24 hour(s))  URINALYSIS, ROUTINE W REFLEX MICROSCOPIC     Status: Normal   Collection Time   10/27/11 10:36 AM      Component Value Range   Color, Urine YELLOW  YELLOW   APPearance CLEAR  CLEAR   Specific Gravity, Urine 1.025  1.005 - 1.030   pH 5.5  5.0 - 8.0  Glucose, UA NEGATIVE  NEGATIVE mg/dL   Hgb urine dipstick NEGATIVE  NEGATIVE   Bilirubin Urine NEGATIVE  NEGATIVE   Ketones, ur NEGATIVE  NEGATIVE mg/dL   Protein, ur NEGATIVE  NEGATIVE mg/dL   Urobilinogen, UA 1.0  0.0 - 1.0 mg/dL   Nitrite NEGATIVE  NEGATIVE   Leukocytes, UA NEGATIVE  NEGATIVE  POCT PREGNANCY, URINE     Status: Normal   Collection Time   10/27/11 10:43 AM      Component Value Range   Preg Test, Ur NEGATIVE  NEGATIVE  CBC WITH DIFFERENTIAL     Status: Normal   Collection Time   10/27/11 11:20 AM      Component Value Range   WBC 6.8  4.0 - 10.5 K/uL   RBC 4.36  3.87 - 5.11 MIL/uL   Hemoglobin 13.8  12.0 - 15.0 g/dL    HCT 16.1  09.6 - 04.5 %   MCV 94.5  78.0 - 100.0 fL   MCH 31.7  26.0 - 34.0 pg   MCHC 33.5  30.0 - 36.0 g/dL   RDW 40.9  81.1 - 91.4 %   Platelets 218  150 - 400 K/uL   Neutrophils Relative 59  43 - 77 %   Neutro Abs 4.0  1.7 - 7.7 K/uL   Lymphocytes Relative 27  12 - 46 %   Lymphs Abs 1.9  0.7 - 4.0 K/uL   Monocytes Relative 10  3 - 12 %   Monocytes Absolute 0.7  0.1 - 1.0 K/uL   Eosinophils Relative 4  0 - 5 %   Eosinophils Absolute 0.2  0.0 - 0.7 K/uL   Basophils Relative 0  0 - 1 %   Basophils Absolute 0.0  0.0 - 0.1 K/uL     *RADIOLOGY REPORT*  Clinical Data: Right-sided pelvic pain. History of PCOS.  TRANSABDOMINAL AND TRANSVAGINAL ULTRASOUND OF PELVIS  Technique: Both transabdominal and transvaginal ultrasound  examinations of the pelvis were performed. Transabdominal technique  was performed for global imaging of the pelvis including uterus,  ovaries, adnexal regions, and pelvic cul-de-sac.  It was necessary to proceed with endovaginal exam following the  transabdominal exam to visualize the endometrium.  Comparison: Pelvic ultrasound 07/02/2007  Findings:  Uterus: Normal in size and appearance. Anteverted and measures 8.2  x 3.4 x 4.7 cm. Normal in echotexture. No focal uterine mass  identified.  Endometrium: Measures 5-7 mm in thickness.  Right ovary: Enlarged and ellipsoid. Measures 7.0 x 2.4 x 3.3 cm.  Contains multiple small peripherall- oriented follicles and  prominent central echogenic stroma. No dominant follicle, mass, or  cyst.  Left ovary: Enlarged and ellipsoid. Measures 5.7 x 1.7 x 2.9 cm  and contains multiple small peripherally-oriented follicles and  prominent central echogenic stroma. No dominant follicle, mass or  cyst.  Other findings: Small amount of free fluid in the adnexa  bilaterally.  IMPRESSION:  1. Uterus within normal limits.  2. The ovaries meet the sonographic criteria for polycystic  ovarian syndrome.  3. No adnexal mass  identified.  Original Report Authenticated By: Britta Mccreedy, M.D.    MAU Course  Procedures  Gc/chl culture to lab  MDM Patient states she has to leave --explained report is not back.  She has to take her Mother somewhere and states she will come back when she gets her there.    Assessment and Plan  A:  Abdominal pain      U/S findings consistent with PCOS  Bacterial vaginosis  P:GYN CLINIC appointment requested for followup.    Percocet #10 for home   Flagyl 500mg  bid X 1 week eRx to pharmacy       KEY,EVE M 10/27/2011, 11:11 AM

## 2011-12-10 ENCOUNTER — Encounter: Payer: Self-pay | Admitting: Obstetrics & Gynecology

## 2011-12-22 ENCOUNTER — Encounter: Payer: Self-pay | Admitting: Obstetrics & Gynecology

## 2011-12-30 ENCOUNTER — Encounter (HOSPITAL_COMMUNITY): Payer: Self-pay | Admitting: *Deleted

## 2011-12-30 ENCOUNTER — Emergency Department (HOSPITAL_COMMUNITY)
Admission: EM | Admit: 2011-12-30 | Discharge: 2011-12-30 | Disposition: A | Discharge status: Home / Self Care | Payer: Self-pay | Attending: Emergency Medicine | Admitting: Emergency Medicine

## 2011-12-30 DIAGNOSIS — K0889 Other specified disorders of teeth and supporting structures: Secondary | ICD-10-CM

## 2011-12-30 DIAGNOSIS — Z87891 Personal history of nicotine dependence: Secondary | ICD-10-CM | POA: Insufficient documentation

## 2011-12-30 DIAGNOSIS — Z8659 Personal history of other mental and behavioral disorders: Secondary | ICD-10-CM | POA: Insufficient documentation

## 2011-12-30 DIAGNOSIS — Z79899 Other long term (current) drug therapy: Secondary | ICD-10-CM | POA: Insufficient documentation

## 2011-12-30 DIAGNOSIS — K089 Disorder of teeth and supporting structures, unspecified: Secondary | ICD-10-CM | POA: Insufficient documentation

## 2011-12-30 DIAGNOSIS — Z8742 Personal history of other diseases of the female genital tract: Secondary | ICD-10-CM | POA: Insufficient documentation

## 2011-12-30 MED ORDER — OXYCODONE-ACETAMINOPHEN 5-325 MG PO TABS: 1.0000 | ORAL_TABLET | Freq: Four times a day (QID) | ORAL | Status: DC | PRN

## 2011-12-30 NOTE — ED Notes (Signed)
Dental Box placed at bedside per orders.

## 2011-12-30 NOTE — ED Provider Notes (Signed)
History     CSN: [id number]  Arrival date & time 12/30/11  0534   First MD Initiated Contact with Patient 12/30/11 0554      Chief Complaint  Patient presents with  . Dental Pain    (Consider location/radiation/quality/duration/timing/severity/associated sxs/prior treatment) HPINaomi L [name] is a 26 y.o. female presenting with lower tooth pain. She has had a fractured and painful tooth for weeks, she is taking an antibiotic, ibuprofen and "cocci something" from a friend. She seen a dentist and says it's expensive to get it fixed, she says she cannot come up with a $350 for repair. Pain is currently 10/10, nonradiating, no associated headache or vomiting, no bad taste in the mouth, no fevers or chills. Ibuprofen has helped somewhat.   Past Medical History  Diagnosis Date  . Tumor of ovary   . Depression   . Headache   . Boils   . Bipolar 1 disorder, depressed stated by patient    Past Surgical History  Procedure Date  . Tumor removal     History reviewed. No pertinent family history.  History  Substance Use Topics  . Smoking status: Former Games developer  . Smokeless tobacco: Not on file  . Alcohol Use: No    OB History    Grav Para Term Preterm Abortions TAB SAB Ect Mult Living   0               Review of Systems At least 10pt or greater review of systems completed and are negative except where specified in the HPI.  Allergies  Ibuprofen  Home Medications   Current Outpatient Rx  Name Route Sig Dispense Refill  . OXYCODONE-ACETAMINOPHEN 5-325 MG PO TABS Oral Take 1-2 tablets by mouth every 6 (six) hours as needed for pain. 7 tablet 0  . PROMETHAZINE HCL 25 MG PO TABS Oral Take 12.5 mg by mouth daily as needed. For nausea, from migraines    . PSEUDOEPHEDRINE HCL ER 120 MG PO TB12 Oral Take 120 mg by mouth every 12 (twelve) hours as needed. For stuffy nose      BP 143/98  Pulse 94  Temp 97.8 F (36.6 C) (Oral)  Resp 20  SpO2 98%  Physical Exam  Nursing  notes reviewed.  Electronic medical record reviewed. VITAL SIGNS:   Filed Vitals:   12/30/11 0541  BP: 143/98  Pulse: 94  Temp: 97.8 F (36.6 C)  TempSrc: Oral  Resp: 20  SpO2: 98%   CONSTITUTIONAL: Awake, oriented, appears non-toxic HENT: Atraumatic, normocephalic, oral mucosa pink and moist, airway patent. #31 has caries, tender to percussion. No abscess. Nares patent without drainage. External ears normal. EYES: Conjunctiva clear, EOMI, PERRLA NECK: Trachea midline, non-tender, supple CARDIOVASCULAR: Normal heart rate, Normal rhythm, No murmurs, rubs, gallops PULMONARY/CHEST: Clear to auscultation, no rhonchi, wheezes, or rales. Symmetrical breath sounds. Non-tender. ABDOMINAL: Non-distended, soft, non-tender - no rebound or guarding.  BS normal. NEUROLOGIC: Non-focal, moving all four extremities, no gross sensory or motor deficits. EXTREMITIES: No clubbing, cyanosis, or edema SKIN: Warm, Dry, No erythema, No rash  ED Course  Dental Date/Time: 12/30/2011 7:05 AM Performed by: Jones Skene Authorized by: Jones Skene Consent: Verbal consent obtained. Consent given by: patient Local anesthesia used: yes Local anesthetic: topical anesthetic and bupivacaine 0.5% with epinephrine Patient sedated: no Patient tolerance: Patient tolerated the procedure well with no immediate complications. Comments: Right-sided inferior alveolar block placed with good result   (including critical care time)  Labs Reviewed - No data to display  No results found.   1. Pain, dental       MDM  [name] is a 26 y.o. female presents with dental pain. Inferior alveolar block applied with good result. Patient is instructed to followup with her dentist and Filbert Schilder money that she needs to get her tooth fixed or to see [facility]. She is given instructions to return in case the tooth becomes abscessed-there is no indication of infection at this time. Airway is patent, she is  nontoxic, afebrile she'll be discharged home stable and in good condition.  I explained the diagnosis and have given explicit precautions to return to the ER including worsening pain, redness, draining pus, swollen throat or any other new or worsening symptoms. The patient understands and accepts the medical plan as it's been dictated and I have answered their questions. Discharge instructions concerning home care and prescriptions have been given.  The patient is STABLE and is discharged to home in good condition.         Jones Skene, MD 12/30/11 [id number]

## 2011-12-30 NOTE — ED Notes (Signed)
Pt states she has had lower molar pain since yesterday. Pt has multiple drugs in no bottles that she states people gave her for pain relief. Pt states she thinks they are a antibiotic, ibuprofen, and oxi-something. Pt is holding a towel to her face and crying at this time.

## 2012-01-14 ENCOUNTER — Encounter: Payer: Self-pay | Admitting: Obstetrics & Gynecology

## 2012-02-11 ENCOUNTER — Encounter (HOSPITAL_COMMUNITY): Payer: Self-pay

## 2012-02-11 ENCOUNTER — Encounter: Payer: Self-pay | Admitting: *Deleted

## 2012-02-11 ENCOUNTER — Inpatient Hospital Stay (HOSPITAL_COMMUNITY)
Admission: AD | Admit: 2012-02-11 | Discharge: 2012-02-11 | Disposition: A | Discharge status: Home / Self Care | Payer: Self-pay | Source: Ambulatory Visit | Attending: Family Medicine | Admitting: Family Medicine

## 2012-02-11 DIAGNOSIS — N72 Inflammatory disease of cervix uteri: Secondary | ICD-10-CM | POA: Insufficient documentation

## 2012-02-11 DIAGNOSIS — M545 Low back pain, unspecified: Secondary | ICD-10-CM | POA: Insufficient documentation

## 2012-02-11 DIAGNOSIS — A5901 Trichomonal vulvovaginitis: Secondary | ICD-10-CM | POA: Insufficient documentation

## 2012-02-11 DIAGNOSIS — N949 Unspecified condition associated with female genital organs and menstrual cycle: Secondary | ICD-10-CM | POA: Insufficient documentation

## 2012-02-11 HISTORY — DX: Polycystic ovarian syndrome: E28.2

## 2012-02-11 LAB — POCT PREGNANCY, URINE: Preg Test, Ur: NEGATIVE

## 2012-02-11 LAB — URINALYSIS, ROUTINE W REFLEX MICROSCOPIC
Glucose, UA: NEGATIVE mg/dL
Specific Gravity, Urine: >1.03 — ABNORMAL HIGH (ref 1.005–1.030)
Urobilinogen, UA: 1 mg/dL (ref 0.0–1.0)

## 2012-02-11 LAB — URINE MICROSCOPIC-ADD ON

## 2012-02-11 LAB — WET PREP, GENITAL

## 2012-02-11 MED ORDER — LIDOCAINE HCL (PF) 1 % IJ SOLN
INTRAMUSCULAR | Status: AC
  Filled 2012-02-11: qty 30

## 2012-02-11 MED ORDER — METRONIDAZOLE 500 MG PO TABS: 500.0000 mg | ORAL_TABLET | Freq: Two times a day (BID) | ORAL | Status: DC

## 2012-02-11 MED ORDER — CEFTRIAXONE SODIUM 250 MG IJ SOLR
250.0000 mg | Freq: Once | INTRAMUSCULAR | Status: AC
  Administered 2012-02-11: 250 mg via INTRAMUSCULAR
  Filled 2012-02-11: qty 250

## 2012-02-11 MED ORDER — AZITHROMYCIN 250 MG PO TABS
1000.0000 mg | ORAL_TABLET | Freq: Once | ORAL | Status: AC
  Administered 2012-02-11: 1000 mg via ORAL
  Filled 2012-02-11: qty 4

## 2012-02-11 NOTE — MAU Provider Note (Signed)
Chart reviewed and agree with management and plan.  

## 2012-02-11 NOTE — MAU Provider Note (Signed)
History     CSN: [id number]  Arrival date and time: 02/11/12 0220   None     Chief Complaint  Patient presents with  . Back Pain   HPI [name] [name] is a 26 y.o. female who presents to MAU with nausea and low back pain. Thinks she may be pregnant. For the past few weeks has had episodes of nausea when eats certain foods. No birth control. Current sex partner x 3 years. Vaginal discharge that is thin white. History of GC, Chlamydia and trichomonas. Dx with BV back in August but never got medication because did not have a job at that time. Dx with PCOS at age 26. Surgery to remove tumor on ovary. Last pap smear less than one year ago at [facility] and was normal. Patient states she wanted to know if she was pregnant and be evaluated for the vaginal discharge. She denies nausea or vomiting. She denies abdominal pain. The history was provided by the patient.   OB History    Grav Para Term Preterm Abortions TAB SAB Ect Mult Living   0               Past Medical History  Diagnosis Date  . Tumor of ovary   . Depression   . Headache   . Boils   . Bipolar 1 disorder, depressed stated by patient  . Polycystic ovarian syndrome     Past Surgical History  Procedure Date  . Tumor removal     History reviewed. No pertinent family history.  History  Substance Use Topics  . Smoking status: Current Every Day Smoker  . Smokeless tobacco: Not on file  . Alcohol Use: No    Allergies:  Allergies  Allergen Reactions  . Ibuprofen Other (See Comments)    Stomach upset    Prescriptions prior to admission  Medication Sig Dispense Refill  . promethazine (PHENERGAN) 25 MG tablet Take 12.5 mg by mouth daily as needed. For nausea, from migraines      . oxyCODONE-acetaminophen (PERCOCET/ROXICET) 5-325 MG per tablet Take 1-2 tablets by mouth every 6 (six) hours as needed for pain.  7 tablet  0  . pseudoephedrine (SUDAFED) 120 MG 12 hr tablet Take 120 mg by mouth every 12 (twelve) hours  as needed. For stuffy nose        Review of Systems  Constitutional: Negative for fever, chills and weight loss.  HENT: Negative for ear pain, nosebleeds, congestion, sore throat and neck pain.   Eyes: Negative for blurred vision, double vision, photophobia and pain.  Respiratory: Negative for cough, shortness of breath and wheezing.   Cardiovascular: Negative for chest pain, palpitations and leg swelling.  Gastrointestinal: Positive for nausea. Negative for heartburn, vomiting, abdominal pain, diarrhea and constipation.  Genitourinary: Negative for dysuria, urgency and frequency.       Vaginal discharge  Musculoskeletal: Positive for back pain. Negative for myalgias.  Skin: Negative for itching and rash.  Neurological: Negative for dizziness, sensory change, speech change, seizures, weakness and headaches.  Endo/Heme/Allergies: Does not bruise/bleed easily.  Psychiatric/Behavioral: Positive for depression (bipolar). The patient has insomnia. The patient is not nervous/anxious.   Blood pressure 115/65, pulse 70, temperature 97.5 F (36.4 C), resp. rate 18, height 5\' 9"  (1.753 m), weight 208 lb (94.348 kg), last menstrual period 01/21/2012, SpO2 99.00%.  Physical Exam  Nursing note and vitals reviewed. Constitutional: She is oriented to person, place, and time. She appears well-developed and well-nourished. No distress.  HENT:  Head: Normocephalic and atraumatic.  Eyes: EOM are normal.  Neck: Neck supple.  Cardiovascular: Normal rate.   Respiratory: Effort normal.  GI: Soft. There is no tenderness.  Genitourinary:       External genitalia without lesions.  Frothy blood tinged discharge vaginal vault. Positive CMT, no adnexal tenderness or mass palpated. Uterus without palpable enlargement.  Musculoskeletal: Normal range of motion.  Neurological: She is alert and oriented to person, place, and time.  Skin: Skin is warm and dry.  Psychiatric: She has a normal mood and affect. Her  behavior is normal. Judgment and thought content normal.   Results for orders placed during the hospital encounter of 02/11/12 (from the past 24 hour(s))  URINALYSIS, ROUTINE W REFLEX MICROSCOPIC     Status: Abnormal   Collection Time   02/11/12  2:29 AM      Component Value Range   Color, Urine YELLOW  YELLOW   APPearance CLEAR  CLEAR   Specific Gravity, Urine >1.030 (*) 1.005 - 1.030   pH 5.5  5.0 - 8.0   Glucose, UA NEGATIVE  NEGATIVE mg/dL   Hgb urine dipstick MODERATE (*) NEGATIVE   Bilirubin Urine NEGATIVE  NEGATIVE   Ketones, ur NEGATIVE  NEGATIVE mg/dL   Protein, ur NEGATIVE  NEGATIVE mg/dL   Urobilinogen, UA 1.0  0.0 - 1.0 mg/dL   Nitrite NEGATIVE  NEGATIVE   Leukocytes, UA SMALL (*) NEGATIVE  URINE MICROSCOPIC-ADD ON     Status: Normal   Collection Time   02/11/12  2:29 AM      Component Value Range   Squamous Epithelial / LPF RARE  RARE   WBC, UA 0-2  <3 WBC/hpf   RBC / HPF 3-6  <3 RBC/hpf   Urine-Other TRICHOMONAS PRESENT    POCT PREGNANCY, URINE     Status: Normal   Collection Time   02/11/12  2:39 AM      Component Value Range   Preg Test, Ur NEGATIVE  NEGATIVE   Assessment: 26 y.o. female with vaginal discharge   Trichomonas vaginosis   Cervicitis  Plan:  Flagyl Rx   Rocephin 250 mg IM   Zithromax 1 gram PO   GC, Chlamydia cultures pending   Follow up in GYN Clinic  Procedures Discussed with the patient and all questioned fully answered. She will return if any problems arise.  Aliea, Bobe  Home Medication Instructions [id number]   Printed on:02/11/12 0321  Medication Information                    pseudoephedrine (SUDAFED) 120 MG 12 hr tablet Take 120 mg by mouth every 12 (twelve) hours as needed. For stuffy nose           promethazine (PHENERGAN) 25 MG tablet Take 12.5 mg by mouth daily as needed. For nausea, from migraines           oxyCODONE-acetaminophen (PERCOCET/ROXICET) 5-325 MG per tablet Take 1-2 tablets by mouth every 6 (six)  hours as needed for pain.           metroNIDAZOLE (FLAGYL) 500 MG tablet Take 1 tablet (500 mg total) by mouth 2 (two) times daily.             Teagan Heidrick, RN, FNP, [facility] 02/11/2012, 3:21 AM

## 2012-02-11 NOTE — MAU Note (Signed)
Pt reports" I have been feeling real sick. Everything i eat makes me sick on my stomach', lower back pain.

## 2012-02-12 LAB — GC/CHLAMYDIA PROBE AMP: CT Probe RNA: NEGATIVE

## 2012-02-26 ENCOUNTER — Ambulatory Visit (INDEPENDENT_AMBULATORY_CARE_PROVIDER_SITE_OTHER): Payer: Self-pay | Admitting: Family Medicine

## 2012-02-26 ENCOUNTER — Encounter: Payer: Self-pay | Admitting: Family Medicine

## 2012-02-26 VITALS — BP 116/74 | HR 78 | Temp 97.1°F | Ht 71.0 in | Wt 205.0 lb

## 2012-02-26 DIAGNOSIS — F1921 Other psychoactive substance dependence, in remission: Secondary | ICD-10-CM

## 2012-02-26 DIAGNOSIS — E282 Polycystic ovarian syndrome: Secondary | ICD-10-CM

## 2012-02-26 DIAGNOSIS — N915 Oligomenorrhea, unspecified: Secondary | ICD-10-CM | POA: Insufficient documentation

## 2012-02-26 HISTORY — DX: Polycystic ovarian syndrome: E28.2

## 2012-02-26 MED ORDER — MEDROXYPROGESTERONE ACETATE 5 MG PO TABS: 5.0000 mg | ORAL_TABLET | Freq: Every day | ORAL | Status: DC
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
## 2012-02-26 NOTE — Assessment & Plan Note (Signed)
Discussed at length, etiology and risks.  She will need provera to give herself a cycle, q 2-3 months.

## 2012-02-26 NOTE — Assessment & Plan Note (Signed)
Discussed with pt. At length.  She is interested in fertility--discussed that she would need to re-schedule for treatment with medication.

## 2012-02-26 NOTE — Patient Instructions (Signed)
Preparing for Pregnancy Preparing for pregnancy (preconceptual care) by getting counseling and information from your caregiver before getting pregnant is a good idea. It will help you and your baby have a better chance to have a healthy, safe pregnancy and delivery of your baby. Make an appointment with your caregiver to talk about your health, medical, and family history and how to prepare yourself before getting pregnant. Your caregiver will do a complete physical exam and a Pap test. They will want to know: About you, your spouse or partner, and your family's medical and genetic history. If you are eating a balanced diet and drinking enough fluids. What vitamins and mineral supplements you are taking. This includes taking folic acid before getting pregnant to help prevent birth defects. What medications you are taking including prescription, over-the-counter and herbal medications. If there is any substance abuse like alcohol, smoking, and illegal drugs. If there is any mental or physical domestic violence. If there is any risk of sexually transmitted disease between you and your partner. What immunizations and vaccinations you have had and what you may need before getting pregnant. If you should get tested for HIV infection. If there is any exposure to chemical or toxic substances at home or work. If there are medical problems you have that need to be treated and kept under control before getting pregnant such as diabetes, high blood pressure or others. If there were any past surgeries, pregnancies and problems with them. What your current weight is and to set a goal as to how much weight you should gain while pregnant. Also, they will check if you should lose or gain weight before getting pregnant. What is your exercise routine and what it is safe when you are pregnant. If there are any physical disabilities that need to be addressed. About spacing your pregnancies when there are other  children. If there is a financial problem that may affect you having a child. After talking about the above points with your caregiver, your caregiver will give you advice on how to help treat and work with you on solving any issues, if necessary, before getting pregnant. The goal is to have a healthy and safe pregnancy for you and your baby. You should keep an accurate record of your menstrual periods because it will help in determining your due date. Immunizations that you should have before getting pregnant:  Regular measles, [place] measles (rubella) and mumps. Tetanus and diphtheria. Chickenpox, if not immune. Herpes zoster (Varicella) if not immune. Human papilloma virus vaccine (HPV) between the age of 57 and 69 years old. Hepatitis A vaccine. Hepatitis B vaccine. Influenza vaccine. Pneumococcal vaccine (pneumonia). You should avoid getting pregnant for one month after getting vaccinated with a live virus vaccine such as [place] measles (rubella) vaccine. Other immunizations may be necessary depending on where you live, such as malaria. Ask your caregiver if any other immunizations are needed for you. HOME CARE INSTRUCTIONS  Follow the advice of your caregiver. Before getting pregnant: Begin taking vitamins, supplements, and 0.4 milligrams folic acid daily. Get your immunizations up-to-date. Get help from a nutrition counselor if you do not understand what a balanced diet is, need help with a special medical diet or if you need help to lose or gain weight. Begin exercising. Stop smoking, taking illegal drugs, and drinking alcoholic beverages. Get counseling if there is and type of domestic violence. Get checked for sexually transmitted diseases including HIV. Get any medical problems under control (diabetes, high blood pressure, convulsions,  asthma or others). Resolve any financial concerns. Be sure you and your spouse or partner are ready to have a baby. Keep an accurate record of  your menstrual periods. Document Released: 01/31/2008 Document Revised: 05/12/2011 Document Reviewed: 01/31/2008 [facility] Patient Information 2013 [place], [place]. Polycystic Ovarian Syndrome Polycystic ovarian syndrome is a condition with a number of problems. One problem is with the ovaries. The ovaries are organs located in the female pelvis, on each side of the uterus. Usually, during the menstrual cycle, an egg is released from 1 ovary every month. This is called ovulation. When the egg is fertilized, it goes into the womb (uterus), which allows for the growth of a baby. The egg travels from the ovary through the fallopian tube to the uterus. The ovaries also make the hormones estrogen and progesterone. These hormones help the development of a woman's breasts, body shape, and body hair. They also regulate the menstrual cycle and pregnancy. Sometimes, cysts form in the ovaries. A cyst is a fluid-filled sac. On the ovary, different types of cysts can form. The most common type of ovarian cyst is called a functional or ovulation cyst. It is normal, and often forms during the normal menstrual cycle. Each month, a woman's ovaries grow tiny cysts that hold the eggs. When an egg is fully grown, the sac breaks open. This releases the egg. Then, the sac which released the egg from the ovary dissolves. In one type of functional cyst, called a follicle cyst, the sac does not break open to release the egg. It may actually continue to grow. This type of cyst usually disappears within 1 to 3 months.  One type of cyst problem with the ovaries is called Polycystic Ovarian Syndrome (PCOS). In this condition, many follicle cysts form, but do not rupture and produce an egg. This health problem can affect the following:  Menstrual cycle.  Heart.  Obesity.  Cancer of the uterus.  Fertility.  Blood vessels.  Hair growth (face and body) or baldness.  Hormones.  Appearance.  High blood  pressure.  Stroke.  Insulin production.  Inflammation of the liver.  Elevated blood cholesterol and triglycerides. CAUSES   No one knows the exact cause of PCOS.  Women with PCOS often have a mother or sister with PCOS. There is not yet enough proof to say this is inherited.  Many women with PCOS have a weight problem.  Researchers are looking at the relationship between PCOS and the body's ability to make insulin. Insulin is a hormone that regulates the change of sugar, starches, and other food into energy for the body's use, or for storage. Some women with PCOS make too much insulin. It is possible that the ovaries react by making too many female hormones, called androgens. This can lead to acne, excessive hair growth, weight gain, and ovulation problems.  Too much production of luteinizing hormone (LH) from the pituitary gland in the brain stimulates the ovary to produce too much female hormone (androgen). SYMPTOMS   Infrequent or no menstrual periods, and/or irregular bleeding.  Inability to get pregnant (infertility), because of not ovulating.  Increased growth of hair on the face, chest, stomach, back, thumbs, thighs, or toes.  Acne, oily skin, or dandruff.  Pelvic pain.  Weight gain or obesity, usually carrying extra weight around the waist.  Type 2 diabetes (this is the diabetes that usually does not need insulin).  High cholesterol.  High blood pressure.  Female-pattern baldness or thinning hair.  Patches of thickened and  dark brown or black skin on the neck, arms, breasts, or thighs.  Skin tags, or tiny excess flaps of skin, in the armpits or neck area.  Sleep apnea (excessive snoring and breathing stops at times while asleep).  Deepening of the voice.  Gestational diabetes when pregnant.  Increased risk of miscarriage with pregnancy. DIAGNOSIS  There is no single test to diagnose PCOS.   Your caregiver will:  Take a medical history.  Perform a pelvic  exam.  Perform an ultrasound.  Check your female and female hormone levels.  Measure glucose or sugar levels in the blood.  Do other blood tests.  If you are producing too many female hormones, your caregiver will make sure it is from PCOS. At the physical exam, your caregiver will want to evaluate the areas of increased hair growth. Try to allow natural hair growth for a few days before the visit.  During a pelvic exam, the ovaries may be enlarged or swollen by the increased number of small cysts. This can be seen more easily by vaginal ultrasound or screening, to examine the ovaries and lining of the uterus (endometrium) for cysts. The uterine lining may become thicker, if there has not been a regular period. TREATMENT  Because there is no cure for PCOS, it needs to be managed to prevent problems. Treatments are based on your symptoms. Treatment is also based on whether you want to have a baby or whether you need contraception.  Treatment may include:  Progesterone hormone, to start a menstrual period.  Birth control pills, to make you have regular menstrual periods.  Medicines to make you ovulate, if you want to get pregnant.  Medicines to control your insulin.  Medicine to control your blood pressure.  Medicine and diet, to control your high cholesterol and triglycerides in your blood.  Surgery, making small holes in the ovary, to decrease the amount of female hormone production. This is done through a long, lighted tube (laparoscope), placed into the pelvis through a tiny incision in the lower abdomen. Your caregiver will go over some of the choices with you. WOMEN WITH PCOS HAVE THESE CHARACTERISTICS:  High levels of female hormones called androgens.  An irregular or no menstrual cycle.  May have many small cysts in their ovaries. PCOS is the most common hormonal reproductive problem in women of childbearing age. WHY DO WOMEN WITH PCOS HAVE TROUBLE WITH THEIR MENSTRUAL  CYCLE? Each month, about 20 eggs start to mature in the ovaries. As one egg grows and matures, the follicle breaks open to release the egg, so it can travel through the fallopian tube for fertilization. When the single egg leaves the follicle, ovulation takes place. In women with PCOS, the ovary does not make all of the hormones it needs for any of the eggs to fully mature. They may start to grow and accumulate fluid, but no one egg becomes large enough. Instead, some may remain as cysts. Since no egg matures or is released, ovulation does not occur and the hormone progesterone is not made. Without progesterone, a woman's menstrual cycle is irregular or absent. Also, the cysts produce female hormones, which continue to prevent ovulation.  Document Released: 06/13/2004 Document Revised: 05/12/2011 Document Reviewed: 01/05/2009 [facility] Patient Information 2013 [place], [place].

## 2012-03-13 ENCOUNTER — Emergency Department (HOSPITAL_COMMUNITY)
Admission: EM | Admit: 2012-03-13 | Discharge: 2012-03-13 | Disposition: A | Discharge status: Home / Self Care | Payer: Self-pay

## 2012-03-13 NOTE — ED Notes (Signed)
Pt called from waiting room and parking lot x3. No response

## 2012-03-13 NOTE — ED Notes (Signed)
Pt BIB EMS. Pt c/o not eating or drinking for several weeks. Pt states she was walking to church to have people pray for her but she couldn't make it because she was she too weak and called EMS. Pt told EMS that she may be pregnant. States LMP on 02/18/12. Pt c/o slight abdominal pain. EMS states no guarding or rebound tenderness. Pt ambulatory with steady independent gait.

## 2012-03-15 ENCOUNTER — Encounter (HOSPITAL_COMMUNITY): Payer: Self-pay | Admitting: *Deleted

## 2012-03-15 ENCOUNTER — Emergency Department (HOSPITAL_COMMUNITY)
Admission: EM | Admit: 2012-03-15 | Discharge: 2012-03-17 | Disposition: A | Discharge status: Other Acute Inpatient Hospital | Payer: Self-pay | Attending: Emergency Medicine | Admitting: Emergency Medicine

## 2012-03-15 DIAGNOSIS — Z8742 Personal history of other diseases of the female genital tract: Secondary | ICD-10-CM | POA: Insufficient documentation

## 2012-03-15 DIAGNOSIS — F329 Major depressive disorder, single episode, unspecified: Secondary | ICD-10-CM | POA: Insufficient documentation

## 2012-03-15 DIAGNOSIS — F3289 Other specified depressive episodes: Secondary | ICD-10-CM | POA: Insufficient documentation

## 2012-03-15 DIAGNOSIS — Z872 Personal history of diseases of the skin and subcutaneous tissue: Secondary | ICD-10-CM | POA: Insufficient documentation

## 2012-03-15 DIAGNOSIS — Z3202 Encounter for pregnancy test, result negative: Secondary | ICD-10-CM | POA: Insufficient documentation

## 2012-03-15 DIAGNOSIS — F29 Unspecified psychosis not due to a substance or known physiological condition: Secondary | ICD-10-CM | POA: Insufficient documentation

## 2012-03-15 DIAGNOSIS — F319 Bipolar disorder, unspecified: Secondary | ICD-10-CM | POA: Insufficient documentation

## 2012-03-15 DIAGNOSIS — F17200 Nicotine dependence, unspecified, uncomplicated: Secondary | ICD-10-CM | POA: Insufficient documentation

## 2012-03-15 LAB — COMPREHENSIVE METABOLIC PANEL
ALT: 17 U/L (ref 0–35)
AST: 33 U/L (ref 0–37)
Alkaline Phosphatase: 65 U/L (ref 39–117)
CO2: 21 mEq/L (ref 19–32)
Chloride: 104 mEq/L (ref 96–112)
GFR calc Af Amer: >90 mL/min (ref >90)
GFR calc non Af Amer: >90 mL/min (ref >90)
Glucose, Bld: 93 mg/dL (ref 70–99)
Potassium: 3.6 mEq/L (ref 3.5–5.1)
Sodium: 139 mEq/L (ref 135–145)
Total Bilirubin: 0.8 mg/dL (ref 0.3–1.2)

## 2012-03-15 LAB — CBC WITH DIFFERENTIAL/PLATELET
Basophils Absolute: 0 10*3/uL (ref 0.0–0.1)
Eosinophils Relative: 0 % (ref 0–5)
Lymphocytes Relative: 11 % — ABNORMAL LOW (ref 12–46)
Lymphs Abs: 1.5 10*3/uL (ref 0.7–4.0)
MCV: 93.5 fL (ref 78.0–100.0)
Neutro Abs: 10.6 10*3/uL — ABNORMAL HIGH (ref 1.7–7.7)
Neutrophils Relative %: 80 % — ABNORMAL HIGH (ref 43–77)
Platelets: 237 10*3/uL (ref 150–400)
RBC: 4.28 MIL/uL (ref 3.87–5.11)
WBC: 13.3 10*3/uL — ABNORMAL HIGH (ref 4.0–10.5)

## 2012-03-15 LAB — URINALYSIS, ROUTINE W REFLEX MICROSCOPIC
Leukocytes, UA: NEGATIVE
Nitrite: NEGATIVE
Specific Gravity, Urine: 1.041 — ABNORMAL HIGH (ref 1.005–1.030)
Urobilinogen, UA: 1 mg/dL (ref 0.0–1.0)
pH: 5.5 (ref 5.0–8.0)

## 2012-03-15 LAB — URINE MICROSCOPIC-ADD ON

## 2012-03-15 LAB — RAPID URINE DRUG SCREEN, HOSP PERFORMED
Barbiturates: NOT DETECTED
Tetrahydrocannabinol: POSITIVE — AB

## 2012-03-15 MED ORDER — ONDANSETRON HCL 4 MG PO TABS: 4.0000 mg | ORAL_TABLET | Freq: Three times a day (TID) | ORAL | Status: DC | PRN

## 2012-03-15 MED ORDER — HALOPERIDOL LACTATE 5 MG/ML IJ SOLN: 5.0000 mg | Freq: Two times a day (BID) | INTRAMUSCULAR | Status: DC

## 2012-03-15 MED ORDER — NICOTINE 21 MG/24HR TD PT24
21.0000 mg | MEDICATED_PATCH | Freq: Every day | TRANSDERMAL | Status: DC
  Filled 2012-03-15: qty 1

## 2012-03-15 MED ORDER — ZOLPIDEM TARTRATE 5 MG PO TABS
5.0000 mg | ORAL_TABLET | Freq: Every evening | ORAL | Status: DC | PRN
  Administered 2012-03-15 – 2012-03-16 (×2): 5 mg via ORAL
  Filled 2012-03-15 (×2): qty 1

## 2012-03-15 MED ORDER — HALOPERIDOL LACTATE 5 MG/ML IJ SOLN
5.0000 mg | Freq: Once | INTRAMUSCULAR | Status: AC
  Administered 2012-03-15: 5 mg via INTRAMUSCULAR
  Filled 2012-03-15: qty 1

## 2012-03-15 MED ORDER — BENZTROPINE MESYLATE 1 MG/ML IJ SOLN: 1.0000 mg | Freq: Two times a day (BID) | INTRAMUSCULAR | Status: DC

## 2012-03-15 MED ORDER — LORAZEPAM 1 MG PO TABS
1.0000 mg | ORAL_TABLET | Freq: Three times a day (TID) | ORAL | Status: DC | PRN
  Administered 2012-03-15 – 2012-03-16 (×2): 1 mg via ORAL
  Filled 2012-03-15 (×3): qty 1

## 2012-03-15 MED ORDER — BENZTROPINE MESYLATE 1 MG PO TABS
1.0000 mg | ORAL_TABLET | Freq: Two times a day (BID) | ORAL | Status: DC
  Administered 2012-03-15 – 2012-03-16 (×3): 1 mg via ORAL
  Filled 2012-03-15 (×3): qty 1

## 2012-03-15 MED ORDER — HALOPERIDOL 5 MG PO TABS
5.0000 mg | ORAL_TABLET | Freq: Two times a day (BID) | ORAL | Status: DC
  Administered 2012-03-15 – 2012-03-16 (×2): 5 mg via ORAL
  Filled 2012-03-15 (×2): qty 1

## 2012-03-15 MED ORDER — IBUPROFEN 600 MG PO TABS: 600.0000 mg | ORAL_TABLET | Freq: Three times a day (TID) | ORAL | Status: DC | PRN

## 2012-03-15 MED ORDER — ALUM & MAG HYDROXIDE-SIMETH 200-200-20 MG/5ML PO SUSP: 30.0000 mL | ORAL | Status: DC | PRN

## 2012-03-15 NOTE — [facility] Assessment (Signed)
[facility] Assessment Progress Note      Pt was placed under IVC by Dr. Judd Lien at 12pm on 03/15/2012. Papers were faxed to the Magistrates office to be served.

## 2012-03-15 NOTE — BH Assessment (Addendum)
Assessment Note   [name] [name] is an 27 y.o. female that was brought in by [facility] after [employer] found her [place].  Pt is actively psychotic and is responding to internal stimuli in the interview. Pt is wild eyed and pacing throughout the brief encounter.  Pt keeps stating, "I have to go now to 4415."  Pt then begins to chant and pray to Jesus.  Pt is irritated and agitated about having to wait to speak to the Dr and is initially refusing treatment.  Pt will need to be placed under IVC if not already completed as she is concerned about needing to leave.  Pt is unable and unwilling to answer most assessment questions and states "My husband will do that."  Pt has signed a release to speak to her husband, but there has been no response at the number given."  It is unknown if pt is being treated on an outpatient basis or has any inpatient history.  Pt will need inpatient treatment at this time to address her instability and psychosis.    Writer was able to contact pt's Step-Mother, Loistine Simas at [phone], who did report that "she takes medications, but I'm not sure which ones.  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  She has never been hospitalized before according to the Step-Mother.    Axis I: Psychotic Disorder NOS Axis II: Deferred Axis III:  Past Medical History  Diagnosis Date  . Tumor of ovary   . Depression   . Headache   . Boils   . Bipolar 1 disorder, depressed stated by patient  . Polycystic ovarian syndrome    Axis IV: other psychosocial or environmental problems, problems related to social environment and problems with primary support group Axis V: 21-30 behavior considerably influenced by delusions or hallucinations OR serious impairment in judgment, communication OR inability to function in almost all areas  Past Medical History:  Past Medical History  Diagnosis Date  . Tumor of ovary   .  Depression   . Headache   . Boils   . Bipolar 1 disorder, depressed stated by patient  . Polycystic ovarian syndrome     Past Surgical History  Procedure Date  . Tumor removal     Family History: No family history on file.  Social History:  reports that she has been smoking.  She does not have any smokeless tobacco history on file. She reports that she drinks alcohol. She reports that she uses illicit drugs (Marijuana and Cocaine).  Additional Social History:     CIWA: CIWA-Ar BP: 133/79 mmHg Pulse Rate: 74  COWS:    Allergies:  Allergies  Allergen Reactions  . Ibuprofen Other (See Comments)    Stomach upset    Home Medications:  (Not in a hospital admission)  OB/GYN Status:  Patient's last menstrual period was 02/18/2012.  General Assessment Data Location of Assessment: [facility] ED ACT Assessment: Yes Living Arrangements: Spouse/significant other Can pt return to current living arrangement?: Yes Admission Status: Involuntary Is patient capable of signing voluntary admission?: No Transfer from: Acute Hospital Referral Source: Other ([employer])  Education Status Is patient currently in school?: No  Risk to self Suicidal Ideation: No Suicidal Intent: No Is patient at risk for suicide?: No Suicidal Plan?: No Access to Means: No What has been your use of drugs/alcohol within the last 12 months?: states that she has snorted  Cocaine this am Previous Attempts/Gestures: No How many times?:  (n/a) Other Self Harm Risks: unpredictable Triggers for Past Attempts: Hallucinations;Unpredictable;Other personal contacts Intentional Self Injurious Behavior: None Family Suicide History: Unknown Recent stressful life event(s): Recent negative physical changes Persecutory voices/beliefs?: Yes Depression: No Depression Symptoms: Insomnia Substance abuse history and/or treatment for substance abuse?: Yes Suicide prevention information given to non-admitted patients: Not  applicable  Risk to Others Homicidal Ideation: No Thoughts of Harm to Others: No Current Homicidal Intent: No Current Homicidal Plan: No Access to Homicidal Means: No Identified Victim: none currently expressed History of harm to others?:  (unknown; pt won't answer) Assessment of Violence:  (unknown; pt won't answer) Violent Behavior Description:  (pt is irritable and agitated, but not threatening or violent) Does patient have access to weapons?: No Criminal Charges Pending?:  (unknown) Does patient have a court date:  (unknown)  Psychosis Hallucinations: Auditory (is responding to voices currently) Delusions: Unspecified (is currently religiously preoccupied)  Mental Status Report Appear/Hygiene: Disheveled;Poor hygiene Eye Contact: Fair Motor Activity: Restlessness;Agitation Speech: Loud;Pressured Level of Consciousness: Alert;Irritable Mood: Anxious;Apprehensive;Irritable;Suspicious;Angry Affect: Anxious;Blunted;Irritable Anxiety Level: None Thought Processes: Irrelevant;Tangential Judgement: Impaired Orientation: Not oriented Obsessive Compulsive Thoughts/Behaviors: Severe  Cognitive Functioning Concentration: Decreased Memory: Recent Impaired;Remote Impaired IQ: Average (unable to assess) Insight: Poor Impulse Control: Poor Appetite:  (unknown; unable to assess) Weight Loss:  (unable to assess) Weight Gain:  (unable to assess) Sleep: Decreased Total Hours of Sleep:  (unknown; but was found early this am) Vegetative Symptoms: None  ADLScreening [facility] Assessment Services) Patient's cognitive ability adequate to safely complete daily activities?: No Patient able to express need for assistance with ADLs?: Yes Independently performs ADLs?: Yes (appropriate for developmental age)  Abuse/Neglect [facility]) Physical Abuse:  (Pt will not answer question) Verbal Abuse:  (Pt will not answer) Sexual Abuse:  (Pt will not answer)  Prior Inpatient Therapy Prior Inpatient  Therapy:  (unknown; pt unable to answer) Prior Therapy Dates: unknown Prior Therapy Facilty/Provider(s): unknown Reason for Treatment: unknown  Prior Outpatient Therapy Prior Outpatient Therapy:  (unknown; pt unable to answer) Prior Therapy Dates: unknown Prior Therapy Facilty/Provider(s): unknown Reason for Treatment: unknown  ADL Screening (condition at time of admission) Patient's cognitive ability adequate to safely complete daily activities?: No Patient able to express need for assistance with ADLs?: Yes Independently performs ADLs?: Yes (appropriate for developmental age) Weakness of Legs: None Weakness of Arms/Hands: None       Abuse/Neglect Assessment (Assessment to be complete while patient is alone) Physical Abuse:  (Pt will not answer question) Verbal Abuse:  (Pt will not answer) Sexual Abuse:  (Pt will not answer) Exploitation of patient/patient's resources:  (Pt will not answer) Self-Neglect:  (Pt will not answer) Values / Beliefs Cultural Requests During Hospitalization: None Spiritual Requests During Hospitalization: None   Advance Directives (For Healthcare) Advance Directive:  (Unknown; pt unable to answer)    Additional Information 1:1 In Past 12 Months?:  (unknown) CIRT Risk: No Elopement Risk: Yes Does patient have medical clearance?: Yes     Disposition:  Please run for possible inpatient hospitalization.  Will also refer to outside facilities where available.   Disposition Disposition of Patient: Referred to Patient referred to: Other (Comment)  On Site Evaluation by:   Reviewed with Physician:     Angelica Ran 03/15/2012 11:20 AM

## 2012-03-15 NOTE — ED Notes (Addendum)
Pt brought in by [facility], escorted by [employer].  Per [facility], pt was [place] without shoes or socks on.  Pt states that her Dad just passed away.  Then started to say that she had been lying to her husband because she reports that she is pregnant, and is scared to tell her husband because she had been telling him that she cannot get pregnant because she had a hysterectomy.  Pt requesting to have her husband called.  Pt is calm and cooperative.  Pt becomes hysterical at times but easily redirected.  Pt reports "Sniffing cocaine" this am when asked if she takes any meds.

## 2012-03-15 NOTE — ED Notes (Signed)
Pt continually coming up to desk.  Pt hysterical, crying, and paranoid.  Pt upset with condition of bathrooms, floors, etc.  Pt removing pants and trying to come out of room.  Pt instructed to stay in room since clothing has been removed.

## 2012-03-15 NOTE — Consult Note (Signed)
Reason for Consult: Paranoia, confusion, noncompliance and marijuana abuse Referring Physician: Dr. Joanie Coddington is an 27 y.o. female.  HPI: Patient was seen and chart reviewed. Patient came to the [facility] long emergency department after [employer] found her [place], with bizarre behaviors, naked and responded to internal stimuli. Patient reported she was ran out of her home and unable to find her home back. Patient has been chanting and praying to Jesus. Patient was found irritable, agitated, and unable to provide the lienear and goal-directed history. Patient was initially refusing to take her treatment provided and and required to be placed on involuntary commitment. Patient has been giving the phone number to contact her stepmother, brother and  dad and mom but unable to recall the phone contacts.. Patient received the medication, Haldol 5 mg intramuscular, and the calm down. Patient has history of using cocaine, last drug screen was +5 years ago and she has a being positive for tetrahydrocannabinol at this time. Patient has a history of for legal charges and being in prison reportedly 2002, and has some unknown legal charges at this time. Patient was able to eat and drink today. Patient requests to be discharged, but unable to provide the information related to place to live and the familycares for her.   MSE: Patient was appeared sitting on her bed calm, quite cooperative. She was dysphoric and affect and depressed mood. She was somewhat confused, and the anxious. Patient has denied suicidal, homicidal ideations, or intentions, or plans.  Past Medical History  Diagnosis Date  . Tumor of ovary   . Depression   . Headache   . Boils   . Bipolar 1 disorder, depressed stated by patient  . Polycystic ovarian syndrome     Past Surgical History  Procedure Date  . Tumor removal     No family history on file.  Social History:  reports that she has been smoking.  She does  not have any smokeless tobacco history on file. She reports that she drinks alcohol. She reports that she uses illicit drugs (Marijuana and Cocaine).  Allergies:  Allergies  Allergen Reactions  . Ibuprofen Other (See Comments)    Stomach upset    Medications: I have reviewed the patient's current medications.  Results for orders placed during the hospital encounter of 03/15/12 (from the past 48 hour(s))  URINE RAPID DRUG SCREEN (HOSP PERFORMED)     Status: Abnormal   Collection Time   03/15/12  8:51 AM      Component Value Range Comment   Opiates NONE DETECTED  NONE DETECTED    Cocaine NONE DETECTED  NONE DETECTED    Benzodiazepines NONE DETECTED  NONE DETECTED    Amphetamines NONE DETECTED  NONE DETECTED    Tetrahydrocannabinol POSITIVE (*) NONE DETECTED    Barbiturates NONE DETECTED  NONE DETECTED   URINALYSIS, ROUTINE W REFLEX MICROSCOPIC     Status: Abnormal   Collection Time   03/15/12  8:51 AM      Component Value Range Comment   Color, Urine AMBER (*) YELLOW BIOCHEMICALS MAY BE AFFECTED BY COLOR   APPearance CLEAR  CLEAR    Specific Gravity, Urine 1.041 (*) 1.005 - 1.030    pH 5.5  5.0 - 8.0    Glucose, UA NEGATIVE  NEGATIVE mg/dL    Hgb urine dipstick NEGATIVE  NEGATIVE    Bilirubin Urine SMALL (*) NEGATIVE    Ketones, ur >80 (*) NEGATIVE mg/dL    Protein,  ur 30 (*) NEGATIVE mg/dL    Urobilinogen, UA 1.0  0.0 - 1.0 mg/dL    Nitrite NEGATIVE  NEGATIVE    Leukocytes, UA NEGATIVE  NEGATIVE   PREGNANCY, URINE     Status: Normal   Collection Time   03/15/12  8:51 AM      Component Value Range Comment   Preg Test, Ur NEGATIVE  NEGATIVE   URINE MICROSCOPIC-ADD ON     Status: Abnormal   Collection Time   03/15/12  8:51 AM      Component Value Range Comment   Squamous Epithelial / LPF RARE  RARE    WBC, UA 0-2  <3 WBC/hpf    RBC / HPF 0-2  <3 RBC/hpf    Bacteria, UA FEW (*) RARE    Urine-Other MUCOUS PRESENT     CBC WITH DIFFERENTIAL     Status: Abnormal   Collection  Time   03/15/12  9:00 AM      Component Value Range Comment   WBC 13.3 (*) 4.0 - 10.5 K/uL    RBC 4.28  3.87 - 5.11 MIL/uL    Hemoglobin 14.1  12.0 - 15.0 g/dL    HCT 10.2  72.5 - 36.6 %    MCV 93.5  78.0 - 100.0 fL    MCH 32.9  26.0 - 34.0 pg    MCHC 35.3  30.0 - 36.0 g/dL    RDW 44.0  34.7 - 42.5 %    Platelets 237  150 - 400 K/uL    Neutrophils Relative 80 (*) 43 - 77 %    Neutro Abs 10.6 (*) 1.7 - 7.7 K/uL    Lymphocytes Relative 11 (*) 12 - 46 %    Lymphs Abs 1.5  0.7 - 4.0 K/uL    Monocytes Relative 9  3 - 12 %    Monocytes Absolute 1.2 (*) 0.1 - 1.0 K/uL    Eosinophils Relative 0  0 - 5 %    Eosinophils Absolute 0.0  0.0 - 0.7 K/uL    Basophils Relative 0  0 - 1 %    Basophils Absolute 0.0  0.0 - 0.1 K/uL   COMPREHENSIVE METABOLIC PANEL     Status: Normal   Collection Time   03/15/12  9:00 AM      Component Value Range Comment   Sodium 139  135 - 145 mEq/L    Potassium 3.6  3.5 - 5.1 mEq/L    Chloride 104  96 - 112 mEq/L    CO2 21  19 - 32 mEq/L    Glucose, Bld 93  70 - 99 mg/dL    BUN 13  6 - 23 mg/dL    Creatinine, Ser 9.56  0.50 - 1.10 mg/dL    Calcium 9.7  8.4 - 38.7 mg/dL    Total Protein 7.9  6.0 - 8.3 g/dL    Albumin 4.4  3.5 - 5.2 g/dL    AST 33  0 - 37 U/L    ALT 17  0 - 35 U/L    Alkaline Phosphatase 65  39 - 117 U/L    Total Bilirubin 0.8  0.3 - 1.2 mg/dL    GFR calc non Af Amer >90  >90 mL/min    GFR calc Af Amer >90  >90 mL/min   ETHANOL     Status: Normal   Collection Time   03/15/12  9:00 AM      Component Value Range Comment   Alcohol,  Ethyl (B) <11  0 - 11 mg/dL     No results found.  Positive for anxiety, bad mood, behavior problems, bipolar, illegal drug usage, mood swings and sleep disturbance Blood pressure 121/74, pulse 65, temperature 97.7 F (36.5 C), temperature source Oral, resp. rate 17, last menstrual period 02/18/2012, SpO2 100.00%.   Assessment/Plan: Psychotic disorder, not otherwise specified Noncompliant with  medication. Tetrahydrocannabinol abuse.  Recommended acute psychiatric hospitalization for crisis stabilization and appropriate. Medication management. Patient was unable to provide information about her social support at this time.  Sharon Rubis,JANARDHAHA R. 03/15/2012, 5:43 PM

## 2012-03-15 NOTE — Plan of Care (Signed)
[id number]  [name] [name]  04/16/1985   [name] [name] was presented for admission to [facility]. After carefull review of the chart, there is too much information missing due to the patient's lack of cooperation to make an accurate assessment.   I have spoken with the evaluating PA in the ED "Robin" and suggested Haldol 5mg  po to help with the agitation and irritability and then reassess when the patient can provide more information. Rona Ravens. Keyshla Tunison [facility] 03/15/2012 12:17 PM

## 2012-03-15 NOTE — ED Notes (Signed)
Talking more this afternoon since injection, still wants to go home, states she talked w/MD and thinks she told him everything and he will let her go home

## 2012-03-15 NOTE — ED Notes (Signed)
Called pt's husband per her request.  No answer.  Pt notified.  Pt states that her husband needs to come here because she is ready to go, pt made aware that she cannot leave until the EDP clears her to go.

## 2012-03-15 NOTE — ED Provider Notes (Signed)
History     CSN: [id number]  Arrival date & time 03/15/12  8657   First MD Initiated Contact with Patient 03/15/12 0845      Chief Complaint  Patient presents with  . Altered Mental Status    (Consider location/radiation/quality/duration/timing/severity/associated sxs/prior treatment) HPI Comments: 27 year old female with a history of bipolar and depression presents to the emergency department via police after being found laying [place] the [place] naked without shoes or socks on. Patient told police that her dad has passed away and she was trying to tell her brother this news, however when asking when her father died patient was not sure. Patient reported that she is pregnant to the nurse upon arrival, however she denies pregnancy at this time. Denies any drug use, however does admit to heroin use when asking about track markings on her arms. Patient qualifies as a level 5 caveat due to psychiatric disorder and mental status change.  Patient is a 27 y.o. female presenting with altered mental status. The history is provided by the patient. The history is limited by the condition of the patient.  Altered Mental Status    Past Medical History  Diagnosis Date  . Tumor of ovary   . Depression   . Headache   . Boils   . Bipolar 1 disorder, depressed stated by patient  . Polycystic ovarian syndrome     Past Surgical History  Procedure Date  . Tumor removal     No family history on file.  History  Substance Use Topics  . Smoking status: Current Every Day Smoker  . Smokeless tobacco: Not on file  . Alcohol Use: Yes     Comment: social    OB History    Grav Para Term Preterm Abortions TAB SAB Ect Mult Living   0               Review of Systems  Unable to perform ROS: Psychiatric disorder  Psychiatric/Behavioral: Positive for altered mental status.    Allergies  Ibuprofen  Home Medications   Current Outpatient Rx  Name  Route  Sig  Dispense  Refill  .  MEDROXYPROGESTERONE ACETATE 5 MG PO TABS   Oral   Take 1 tablet (5 mg total) by mouth daily. Five days every other month if not having menses.   30 tablet   3     BP 133/79  Pulse 74  Temp 97.6 F (36.4 C) (Oral)  Resp 20  SpO2 100%  LMP 02/18/2012  Physical Exam  Nursing note and vitals reviewed. Constitutional: She is oriented to person, place, and time. She appears well-developed and well-nourished. No distress.  HENT:  Head: Normocephalic and atraumatic.  Mouth/Throat: Oropharynx is clear and moist.  Eyes: Conjunctivae normal and EOM are normal. Pupils are equal, round, and reactive to light. No scleral icterus.  Neck: Normal range of motion. Neck supple.  Cardiovascular: Normal rate, regular rhythm and normal heart sounds.   Pulmonary/Chest: Effort normal and breath sounds normal.  Abdominal: Soft. Bowel sounds are normal. There is no tenderness.  Musculoskeletal: Normal range of motion. She exhibits no edema.  Neurological: She is alert and oriented to person, place, and time.  Skin: Skin is warm and dry.  Psychiatric: Her mood appears anxious. Her speech is tangential. She is withdrawn. Thought content is paranoid. Cognition and memory are impaired. She exhibits a depressed mood. She expresses no homicidal and no suicidal ideation.       Tearful    ED  Course  Procedures (including critical care time)  Labs Reviewed  CBC WITH DIFFERENTIAL - Abnormal; Notable for the following:    WBC 13.3 (*)     Neutrophils Relative 80 (*)     Neutro Abs 10.6 (*)     Lymphocytes Relative 11 (*)     Monocytes Absolute 1.2 (*)     All other components within normal limits  URINE RAPID DRUG SCREEN (HOSP PERFORMED) - Abnormal; Notable for the following:    Tetrahydrocannabinol POSITIVE (*)     All other components within normal limits  URINALYSIS, ROUTINE W REFLEX MICROSCOPIC - Abnormal; Notable for the following:    Color, Urine AMBER (*)  BIOCHEMICALS MAY BE AFFECTED BY COLOR    Specific Gravity, Urine 1.041 (*)     Bilirubin Urine SMALL (*)     Ketones, ur >80 (*)     Protein, ur 30 (*)     All other components within normal limits  URINE MICROSCOPIC-ADD ON - Abnormal; Notable for the following:    Bacteria, UA FEW (*)     All other components within normal limits  COMPREHENSIVE METABOLIC PANEL  ETHANOL  PREGNANCY, URINE   No results found.   No diagnosis found.    MDM  ACT team consulted who will evaluate patient for admission.        Trevor Mace, PA-C 03/15/12 1446

## 2012-03-16 MED ORDER — DIVALPROEX SODIUM 500 MG PO DR TAB
500.0000 mg | DELAYED_RELEASE_TABLET | Freq: Two times a day (BID) | ORAL | Status: DC
  Administered 2012-03-16 (×2): 500 mg via ORAL
  Filled 2012-03-16 (×2): qty 1

## 2012-03-16 MED ORDER — QUETIAPINE FUMARATE ER 300 MG PO TB24
300.0000 mg | ORAL_TABLET | Freq: Once | ORAL | Status: AC
  Administered 2012-03-16: 300 mg via ORAL
  Filled 2012-03-16: qty 1

## 2012-03-16 MED ORDER — QUETIAPINE FUMARATE ER 300 MG PO TB24: 600.0000 mg | ORAL_TABLET | Freq: Every day | ORAL | Status: DC

## 2012-03-16 NOTE — Progress Notes (Signed)
Per chart review, pt recommended by telepsych for inpatient admission. Pt thought was being discharged home after asking onsite psychiatrist to go home. However CSW and psychiatrist discussed with patient that discussion and work would need to be done to determine if patient was ready to discharge home. Psychiatrist and CSW discussed that pt was recommended by telepsych 2 hours ago recommending inpatient. CSW provided emotional support to patient regarding staying atleast another night. Pt is requesting to discharge home so she can complete application at [facility]. Pt states to this writer that she has been taking her medications, however per discussion with psychiatrist pt reports that she has not been taking medications since 2009.   Pt states that she plans to follow up with [facility] as an outpatient when psychiatrically stable.   At this time pt is recommended for inpatient psychiatric stabilization.   Catha Gosselin, LCSWA  [phone] .03/16/2012 1537pm

## 2012-03-16 NOTE — Consult Note (Signed)
Reason for Consult: psychosis and substance abuse Referring Physician: Dr. Franne Forts is an 27 y.o. female.  HPI: Patient was seen and chart reviewed. Patient requested to talk with this provider and requested to be discharged so that she can go and apply for a job in [place]. She has a part-time job at the same place in the past. 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 Patient has been trying to contact with Baron Sane and Cameo. godmother and the church bishop as support system. Patient agrees to take medications given to her and what would like to followup with the [facility].   Patient stated she was blocked out who when police found her because of disturbed news that her mom told her. She has dad and brother, who were in prison, and currently living in [place]. Patient mother reportedly lives in [place]. Patient reported she has multiple family members and cousins throughout the [place]. Patient was found with bizarre behaviors and pressured speech and racing thoughts. Patient was recommended to take medication Depakote 500 mg twice daily, and Seroquel starting 300 mg and than increased to 600 mg at bed time. Patient was taking these medication along with Lexapro 10 mg from the 2003 -2009, until she went to jail where stayed for about a year. She also smoking weed and regular basis.  MSE: Patient was appeared needed. The nursing station, calm, cooperative, and able to provide linear and goal-directed information she has good mood, and appropriate affect and than found labile affect when told she would need to be observed longer. Reportedly patient becomes dysphoric and tearful, saying her hopes after leaving the hospital not working. Patient has a pressured speech, racing thoughts, but denies suicidal, homicidal ideation.  Past Medical History  Diagnosis Date  . Tumor of ovary   . Depression   . Headache   . Boils   . Bipolar 1  disorder, depressed stated by patient  . Polycystic ovarian syndrome     Past Surgical History  Procedure Date  . Tumor removal     No family history on file.  Social History:  reports that she has been smoking.  She does not have any smokeless tobacco history on file. She reports that she drinks alcohol. She reports that she uses illicit drugs (Marijuana and Cocaine).  Allergies:  Allergies  Allergen Reactions  . Ibuprofen Other (See Comments)    Stomach upset    Medications: I have reviewed the patient's current medications.  Results for orders placed during the hospital encounter of 03/15/12 (from the past 48 hour(s))  URINE RAPID DRUG SCREEN (HOSP PERFORMED)     Status: Abnormal   Collection Time   03/15/12  8:51 AM      Component Value Range Comment   Opiates NONE DETECTED  NONE DETECTED    Cocaine NONE DETECTED  NONE DETECTED    Benzodiazepines NONE DETECTED  NONE DETECTED    Amphetamines NONE DETECTED  NONE DETECTED    Tetrahydrocannabinol POSITIVE (*) NONE DETECTED    Barbiturates NONE DETECTED  NONE DETECTED   URINALYSIS, ROUTINE W REFLEX MICROSCOPIC     Status: Abnormal   Collection Time   03/15/12  8:51 AM      Component Value Range Comment   Color, Urine AMBER (*) YELLOW BIOCHEMICALS MAY BE AFFECTED BY COLOR   APPearance CLEAR  CLEAR    Specific Gravity, Urine 1.041 (*) 1.005 - 1.030    pH 5.5  5.0 - 8.0    Glucose, UA NEGATIVE  NEGATIVE mg/dL    Hgb urine dipstick NEGATIVE  NEGATIVE    Bilirubin Urine SMALL (*) NEGATIVE    Ketones, ur >80 (*) NEGATIVE mg/dL    Protein, ur 30 (*) NEGATIVE mg/dL    Urobilinogen, UA 1.0  0.0 - 1.0 mg/dL    Nitrite NEGATIVE  NEGATIVE    Leukocytes, UA NEGATIVE  NEGATIVE   PREGNANCY, URINE     Status: Normal   Collection Time   03/15/12  8:51 AM      Component Value Range Comment   Preg Test, Ur NEGATIVE  NEGATIVE   URINE MICROSCOPIC-ADD ON     Status: Abnormal   Collection Time   03/15/12  8:51 AM      Component Value  Range Comment   Squamous Epithelial / LPF RARE  RARE    WBC, UA 0-2  <3 WBC/hpf    RBC / HPF 0-2  <3 RBC/hpf    Bacteria, UA FEW (*) RARE    Urine-Other MUCOUS PRESENT     CBC WITH DIFFERENTIAL     Status: Abnormal   Collection Time   03/15/12  9:00 AM      Component Value Range Comment   WBC 13.3 (*) 4.0 - 10.5 K/uL    RBC 4.28  3.87 - 5.11 MIL/uL    Hemoglobin 14.1  12.0 - 15.0 g/dL    HCT 30.8  65.7 - 84.6 %    MCV 93.5  78.0 - 100.0 fL    MCH 32.9  26.0 - 34.0 pg    MCHC 35.3  30.0 - 36.0 g/dL    RDW 96.2  95.2 - 84.1 %    Platelets 237  150 - 400 K/uL    Neutrophils Relative 80 (*) 43 - 77 %    Neutro Abs 10.6 (*) 1.7 - 7.7 K/uL    Lymphocytes Relative 11 (*) 12 - 46 %    Lymphs Abs 1.5  0.7 - 4.0 K/uL    Monocytes Relative 9  3 - 12 %    Monocytes Absolute 1.2 (*) 0.1 - 1.0 K/uL    Eosinophils Relative 0  0 - 5 %    Eosinophils Absolute 0.0  0.0 - 0.7 K/uL    Basophils Relative 0  0 - 1 %    Basophils Absolute 0.0  0.0 - 0.1 K/uL   COMPREHENSIVE METABOLIC PANEL     Status: Normal   Collection Time   03/15/12  9:00 AM      Component Value Range Comment   Sodium 139  135 - 145 mEq/L    Potassium 3.6  3.5 - 5.1 mEq/L    Chloride 104  96 - 112 mEq/L    CO2 21  19 - 32 mEq/L    Glucose, Bld 93  70 - 99 mg/dL    BUN 13  6 - 23 mg/dL    Creatinine, Ser 3.24  0.50 - 1.10 mg/dL    Calcium 9.7  8.4 - 40.1 mg/dL    Total Protein 7.9  6.0 - 8.3 g/dL    Albumin 4.4  3.5 - 5.2 g/dL    AST 33  0 - 37 U/L    ALT 17  0 - 35 U/L    Alkaline Phosphatase 65  39 - 117 U/L    Total Bilirubin 0.8  0.3 - 1.2 mg/dL    GFR calc non Af Amer >90  >90 mL/min  GFR calc Af Amer >90  >90 mL/min   ETHANOL     Status: Normal   Collection Time   03/15/12  9:00 AM      Component Value Range Comment   Alcohol, Ethyl (B) <11  0 - 11 mg/dL     No results found.  Positive for anxiety, bad mood, bipolar, mood swings and sleep disturbance Blood pressure 112/77, pulse 87, temperature 98 F (36.7  C), temperature source Oral, resp. rate 16, last menstrual period 02/18/2012, SpO2 99.00%.   Assessment/Plan:  Psychotic disorder, not otherwise specified  Noncompliant with medication.  Tetrahydrocannabinol abuse  Recommended acute psychiatric hospitalization for crisis stabilization and appropriate medication management by telepsychiatry today and also made medication recommendations. Patient will be taking medication as prescribed and will reassess if she was not admitted to hospital [place].   Teryn Boerema,JANARDHAHA R. 03/16/2012, 3:33 PM

## 2012-03-16 NOTE — ED Provider Notes (Addendum)
Patient seen and examined today. Patient is alert and oriented x4. She denies any suicidal or homicidal ideations. Denies any auditory or visual hallucinations. Patient to be seen by telemetry psychiatry with likely disposition home with resources  1:44 PM Patient had her telemetry psychiatry consult and has not been cleared for discharge. Medication recommendations made by the psychiatrist and they will be entered.  Toy Baker, MD 03/16/12 1610  Toy Baker, MD 03/16/12 1345

## 2012-03-16 NOTE — BHH Counselor (Signed)
Per Minerva Areola [facility]), pt has been accepted for treatment at [facility].  Dr. Jannifer Franklin is attending physician, [phone].

## 2012-03-16 NOTE — BHH Counselor (Signed)
Disposition pending telepsych consult.  *Initiated 03/16/12 @ 1050.

## 2012-03-16 NOTE — BHH Counselor (Signed)
As of 03/16/12 Pt referred to the following facilities and pending review: [facility], [facility] [place], [facility], [facility], [place], [facility], [place] [facility], [place], [facility], and [facility].   As of 03/16/12 no beds at [facility], [facility], [facility], [facility], [facility], and [facility]	[place] [facility].

## 2012-03-16 NOTE — Progress Notes (Signed)
Per discussion with act, pt has been referred to [place] [facility], [facility], [place], [facility], [facility], [facility], [place], and [facility].  CSW informed Rn Sports coach for [employer].  Catha Gosselin, LCSWA  [phone] 03/16/2012 1842pm

## 2012-03-16 NOTE — ED Notes (Signed)
Renato Gails escorted back by security to see the pt. Renato Gails was told about rules and regulations and items have been stored properly until pastor is ready to leave.

## 2012-03-16 NOTE — BHH Counselor (Signed)
Telepsych recommended inpatient admission. Patient referred to [facility] but no beds at this time. AC Minerva Areola) verified that their would be not be a 400 hall bed available today. Writer will seek placement at alternative places for this patient.

## 2012-03-17 ENCOUNTER — Encounter (HOSPITAL_COMMUNITY): Payer: Self-pay | Admitting: *Deleted

## 2012-03-17 ENCOUNTER — Inpatient Hospital Stay (HOSPITAL_COMMUNITY)
Admission: EM | Admit: 2012-03-17 | Discharge: 2012-03-20 | DRG: 885 | Disposition: A | Discharge status: Home / Self Care | Payer: Federal, State, Local not specified - Other | Source: Ambulatory Visit | Attending: Psychiatry | Admitting: Psychiatry

## 2012-03-17 DIAGNOSIS — F319 Bipolar disorder, unspecified: Secondary | ICD-10-CM | POA: Diagnosis present

## 2012-03-17 DIAGNOSIS — F1921 Other psychoactive substance dependence, in remission: Secondary | ICD-10-CM

## 2012-03-17 DIAGNOSIS — F3130 Bipolar disorder, current episode depressed, mild or moderate severity, unspecified: Principal | ICD-10-CM | POA: Diagnosis present

## 2012-03-17 DIAGNOSIS — F17200 Nicotine dependence, unspecified, uncomplicated: Secondary | ICD-10-CM | POA: Diagnosis present

## 2012-03-17 DIAGNOSIS — Z79899 Other long term (current) drug therapy: Secondary | ICD-10-CM

## 2012-03-17 LAB — LIPID PANEL
HDL: 46 mg/dL (ref >39)
LDL Cholesterol: 100 mg/dL — ABNORMAL HIGH (ref 0–99)
Total CHOL/HDL Ratio: 3.4 RATIO

## 2012-03-17 MED ORDER — OLANZAPINE 10 MG PO TBDP: 10.0000 mg | ORAL_TABLET | Freq: Three times a day (TID) | ORAL | Status: DC | PRN

## 2012-03-17 MED ORDER — TRAZODONE HCL 50 MG PO TABS
50.0000 mg | ORAL_TABLET | Freq: Every evening | ORAL | Status: DC | PRN
  Administered 2012-03-17 – 2012-03-19 (×3): 50 mg via ORAL
  Filled 2012-03-17 (×4): qty 1
  Filled 2012-03-17: qty 28
  Filled 2012-03-17 (×5): qty 1
  Filled 2012-03-17 (×3): qty 28

## 2012-03-17 MED ORDER — NICOTINE 21 MG/24HR TD PT24
21.0000 mg | MEDICATED_PATCH | Freq: Every day | TRANSDERMAL | Status: DC
  Filled 2012-03-17 (×3): qty 1

## 2012-03-17 MED ORDER — ACETAMINOPHEN 325 MG PO TABS: 650.0000 mg | ORAL_TABLET | Freq: Four times a day (QID) | ORAL | Status: DC | PRN

## 2012-03-17 MED ORDER — ESCITALOPRAM OXALATE 10 MG PO TABS
10.0000 mg | ORAL_TABLET | Freq: Every day | ORAL | Status: DC
  Administered 2012-03-17 – 2012-03-20 (×4): 10 mg via ORAL
  Filled 2012-03-17: qty 14
  Filled 2012-03-17: qty 1
  Filled 2012-03-17: qty 14
  Filled 2012-03-17 (×5): qty 1

## 2012-03-17 MED ORDER — QUETIAPINE FUMARATE ER 50 MG PO TB24
100.0000 mg | ORAL_TABLET | Freq: Every day | ORAL | Status: DC
  Administered 2012-03-17 – 2012-03-18 (×2): 100 mg via ORAL
  Filled 2012-03-17 (×3): qty 2

## 2012-03-17 MED ORDER — MAGNESIUM HYDROXIDE 400 MG/5ML PO SUSP: 30.0000 mL | Freq: Every day | ORAL | Status: DC | PRN

## 2012-03-17 MED ORDER — ALUM & MAG HYDROXIDE-SIMETH 200-200-20 MG/5ML PO SUSP: 30.0000 mL | ORAL | Status: DC | PRN

## 2012-03-17 MED ORDER — CITALOPRAM HYDROBROMIDE 20 MG PO TABS
10.0000 mg | ORAL_TABLET | Freq: Every day | ORAL | Status: DC
  Filled 2012-03-17 (×2): qty 0.5

## 2012-03-17 NOTE — Progress Notes (Signed)
Patient ID: [name] [name], female   DOB: [birth year], 27 y.o.   MRN: [id number]    D: Pt appeared to be more lucid today. Held a full conversation with detail explanations for her reasons and beliefs. Pt's main focus is to follow dr's orders in hopes for a speedy discharge. Pt gave a logical explanation about school. 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 Stated that for several months she was homeless and would sometimes attend classes during the day and walk the street at night. Stated that at times she wouldn't go to class, because she wasn't able to shower for several days and would be "stink". Pt stated she recently moved with her Godmother in hopes of saving money to continue with school and also because she doesn't like being alone. Pt informed the writer that she had not been med compliant in the past. Stated that she it made her drowsy most of the day and as soon as it started to wear off, it would be time to take it again.   A: Support and encouragement was offered. 15 min checks continued for safety.  R: Pt remains safe.

## 2012-03-17 NOTE — Treatment Plan (Signed)
Interdisciplinary Treatment Plan Update (Adult)  Date: 03/17/2012  Time Reviewed: 8:32 AM   Progress in Treatment: Attending groups: Yes Participating in groups: Yes Taking medication as prescribed: Yes Tolerating medication: Yes   Family/Significant other contact made: Yes  Patient understands diagnosis:  Yes As evidenced by asking for help with psychosis Discussing patient identified problems/goals with staff:  Yes  See below Medical problems stabilized or resolved:  Yes Denies suicidal/homicidal ideation: Yes  In tx team Issues/concerns per patient self-inventory:  Yes  Requesting d/c so she can start job Other:  New problem(s) identified: N/A  Reason for Continuation of Hospitalization: Hallucinations Medication stabilization  Interventions implemented related to continuation of hospitalization:  Seroquel, Depakote Lexapro trial  Encourage group attendance and participation  Additional comments:  Estimated length of stay: 3-5 days  Discharge Plan: return home, follow up outpt New goal(s): N/A  Review of initial/current patient goals per problem list:   1.  Goal(s):Stabilize mood through the use of medications/therapeutic milieu  Met:  No  Target date:1/20  As evidenced ZO:[id number] will rate her depression and anxiety at 3 or less on a 10 scale   2.  Goal (s):Eliminate psychosis  Met:  No  Target date:1/20  As evidenced [id number] will no longer exhibit any confused or paranoid thinking, nor will she be responding to internal stimuli  3.  Goal(s):Identify outpt provider  Met:  No  Target date:1/20  As evidenced [id number] of appointment time and date  4.  Goal(s):  Met:  No  Target date:  As evidenced by:  Attendees: Patient:     Family:     Physician:  Thedore Mins 03/17/2012 8:32 AM   Nursing:  Liborio Nixon  03/17/2012 8:32 AM   Clinical Social Worker:  Richelle Ito 03/17/2012 8:32 AM   Extender:  Verne Spurr PA 03/17/2012 8:32 AM   Other:      Other:     Other:     Other:      Scribe for Treatment Team:   Ida Rogue, 03/17/2012 8:32 AM

## 2012-03-17 NOTE — BHH Suicide Risk Assessment (Signed)
Suicide Risk Assessment  Admission Assessment     Nursing information obtained from:  Patient Demographic factors:  Adolescent or young adult;Divorced or widowed;Low socioeconomic status;Living alone;Unemployed Current Mental Status:  NA (Pt denies suicidal thoughts or intent) Loss Factors:  Decrease in vocational status;Financial problems / change in socioeconomic status Historical Factors:  Prior suicide attempts Risk Reduction Factors:  Positive social support  CLINICAL FACTORS:   Bipolar Disorder:   Depressive phase Depression:   Aggression Impulsivity Insomnia  COGNITIVE FEATURES THAT CONTRIBUTE TO RISK:  Closed-mindedness Polarized thinking    SUICIDE RISK:   Minimal: No identifiable suicidal ideation.  Patients presenting with no risk factors but with morbid ruminations; may be classified as minimal risk based on the severity of the depressive symptoms  PLAN OF CARE:1. Admit for crisis management and stabilization. 2. Medication management to reduce current symptoms to base line and improve the patient's overall level of functioning 3. Treat health problems as indicated. 4. Develop treatment plan to decrease risk of relapse upon discharge and the need for readmission. 5. Psycho-social education regarding relapse prevention and self care. 6. Health care follow up as needed for medical problems. 7. Restart home medications where appropriate.    I certify that inpatient services furnished can reasonably be expected to improve the patient's condition.  Thedore Mins, MD 03/17/2012, 10:53 AM
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
## 2012-03-17 NOTE — Progress Notes (Signed)
Adult Psychoeducational Group Note  Date:  03/17/2012 Time:  5:24 PM  Group Topic/Focus:  Self Care:   The focus of this group is to help patients understand the importance of self-care in order to improve or restore emotional, physical, spiritual, interpersonal, and financial health.  Participation Level:  Active  Participation Quality:  Appropriate, Attentive and Sharing  Affect:  Appropriate  Cognitive:  Appropriate  Insight: Appropriate  Engagement in Group:  Engaged  Modes of Intervention:  Discussion  Additional Comments:  Pt was appropriate and sharing while attending group. Pt shared that singing at church was something that made her feel good. Pt also stated that taking her medication is something that she plans to work on to better her current health issues.  [name] 03/17/2012, 5:24 PM

## 2012-03-17 NOTE — Progress Notes (Signed)
Invol admit to the 400 hall after police found her naked [place] a [place] area.  Pt was responding to internal stimuli at that time.  On admission to [facility] pt's thoughts were clearer, but at times she became tangential and disorganized.  She does not remember the incident in the landfill.  She says she lives alone in a house, but does not like being alone and that is what caused this entire situation.  She told this Clinical research associate that she was taking medications in 2012, but was unclear as to why she stopped taking them.  Pt denied SI/HI/AV at this time.  She was only +THC.  She seemed very concerned that she be discharged because she has a job interview today at [employer].  Explained to pt that she would be seen by a psychiatrist today and that it would be at the MD discretion when she would be discharge.  Told pt that there is no set days that she would need to be here.  She also seemed afraid that she would be sent to "[facility]".  Assured pt that her MD and case worker would work with her.  Pt mentioned that she had a purse that did not "follow" her to the ED.  This information will be passed to next shift.  Pt was pleasant/cooperative.  No personal items needed to be placed in a locker.  Admission completed and paperwork signed.  Safety maintained with q15 minute checks.

## 2012-03-17 NOTE — [facility] Suicide Risk Assessment (Signed)
Suicide Risk Assessment  Admission Assessment     Nursing information obtained from:  Patient Demographic factors:  Adolescent or young adult;Divorced or widowed;Low socioeconomic status;Living alone;Unemployed Current Mental Status:  NA (Pt denies suicidal thoughts or intent) Loss Factors:  Decrease in vocational status;Financial problems / change in socioeconomic status Historical Factors:  Prior suicide attempts Risk Reduction Factors:  Positive social support  CLINICAL FACTORS:   Bipolar Disorder:   Depressive phase Depression:   Aggression Delusional Hopelessness Impulsivity Insomnia  COGNITIVE FEATURES THAT CONTRIBUTE TO RISK:  Closed-mindedness Polarized thinking    SUICIDE RISK:   Minimal: No identifiable suicidal ideation.  Patients presenting with no risk factors but with morbid ruminations; may be classified as minimal risk based on the severity of the depressive symptoms  PLAN OF CARE:1. Admit for crisis management and stabilization. 2. Medication management to reduce current symptoms to base line and improve the  patient's overall level of functioning 3. Treat health problems as indicated. 4. Develop treatment plan to decrease risk of relapse upon discharge and the need for  readmission. 5. Psycho-social education regarding relapse prevention and self care. 6. Health care follow up as needed for medical problems. 7. Restart home medications where appropriate.    I certify that inpatient services furnished can reasonably be expected to improve the patient's condition.  Thedore Mins, MD 03/17/2012, 9:39 AM

## 2012-03-17 NOTE — Progress Notes (Signed)
Pt denies SI/HI/AVH. Pt presents with disorganized thoughts/flight of ideas. Pt could not stay on topic during assessment with writer and MD. Pt had difficulty answering questions, loose association and mild confusion. Pt complaint with meds and treatment. Pt confused about treatment,and continues to need redirecting about the discharging process here at [facility]. Pt called her god mother to come pick her up however, pt was never informed that she would be discharged today. Pt was informed a second time today, that she would not be discharged today and that she would be started on psych meds today. Pt proceeded to cry in hallway. Pt then presented to medication window reporting that she need to leave today and that she will continue to take her meds once she is discharged. 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   Medication given as ordered per MD. Verbal support given. Pt encouraged to attend groups. Pt continues to need redirecting by staff. 15 minute checks performed for safety.  Pt disorganized. Compliant with treatment. Depressed affect/mood. Pt is safe at this time.

## 2012-03-17 NOTE — Progress Notes (Signed)
[facility] Group Notes:  (Counselor/Nursing/MHT/Case Management/Adjunct)  01/16/2012 12:00 PM  Type of Therapy:  Group Therapy  Participation Level:  Did Not Attend  Smart, Heather N 01/16/2012, 12:00 PM  

## 2012-03-17 NOTE — Tx Team (Signed)
Initial Interdisciplinary Treatment Plan  PATIENT STRENGTHS: (choose at least two) Capable of independent living General fund of knowledge Physical Health Supportive family/friends  PATIENT STRESSORS: Financial difficulties Traumatic event   PROBLEM LIST: Problem List/Patient Goals Date to be addressed Date deferred Reason deferred Estimated date of resolution  "Need to be discharged so I can go to a job interview Wednesday"      "Help me get back on meds"      anxiety                                           DISCHARGE CRITERIA:  Ability to meet basic life and health needs Adequate post-discharge living arrangements Improved stabilization in mood, thinking, and/or behavior Motivation to continue treatment in a less acute level of care Reduction of life-threatening or endangering symptoms to within safe limits Verbal commitment to aftercare and medication compliance  PRELIMINARY DISCHARGE PLAN: Attend aftercare/continuing care group Outpatient therapy Return to previous living arrangement  PATIENT/FAMIILY INVOLVEMENT: This treatment plan has been presented to and reviewed with the patient, [name] [name], and/or family member.  The patient and family have been given the opportunity to ask questions and make suggestions.  Jesus Genera [facility] 03/17/2012, 2:21 AM

## 2012-03-17 NOTE — Progress Notes (Signed)
[facility] LCSW Aftercare Discharge Planning Group Note  03/17/2012 9:37 AM  Participation Quality:  Appropriate  Affect:  Appropriate  Cognitive:  Appropriate  Insight:  Improving  Engagement in Group:  Engaged  Modes of Intervention:  Exploration, Socialization and Support  Summary of Progress/Problems: Pt stated that it was her idea to come to hospital due to disorganized thinking, short term memory loss, and extreme confusion. Pt reported that this was her first episode in experiencing these symptoms, which had been extremely scary for her. She explained that she had wandered around in the rain for hours asking people for help and had taken her shirt off to avoid getting sick. She had been found without clothes on and not knowing where she was, but now has clear memory of the event and stated that she feels much better. 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  Pt stated that she had stopped taking medications at least three years ago but maintains a good support system (family and friends). She lives in [place] with her mother and brother and plans to return home after discharge.   Smart, Heather N 03/17/2012, 9:37 AM

## 2012-03-17 NOTE — ED Notes (Signed)
Patient discharge with [facility] via ambulatory with a steady gait.

## 2012-03-18 DIAGNOSIS — F1210 Cannabis abuse, uncomplicated: Secondary | ICD-10-CM

## 2012-03-18 NOTE — Progress Notes (Signed)
[facility] MD Progress Note  03/18/2012 4:23 PM [name] [name]  MRN:  [id number] Subjective: "I'm doing much better today." Patient is met 1:1 to discuss her progress and response to treatment.  Diagnosis:  Bipolar disorder with depression                       Cannabis abuse  ADL's:  Intact  Sleep: Good  Appetite:  Good  Suicidal Ideation:  denies Homicidal Ideation:  denies AEB (as evidenced by): By the patient's verbal response, affect, and written response on daily self inventory.  Psychiatric Specialty Exam: Review of Systems  Constitutional: Negative.  Negative for fever, chills, weight loss, malaise/fatigue and diaphoresis.  HENT: Negative for congestion and sore throat.   Eyes: Negative for blurred vision, double vision and photophobia.  Respiratory: Negative for cough, shortness of breath and wheezing.   Cardiovascular: Negative for chest pain, palpitations and PND.  Gastrointestinal: Negative for heartburn, nausea, vomiting, abdominal pain, diarrhea and constipation.  Musculoskeletal: Negative for myalgias, joint pain and falls.  Neurological: Negative for dizziness, tingling, tremors, sensory change, speech change, focal weakness, seizures, loss of consciousness, weakness and headaches.  Endo/Heme/Allergies: Negative for polydipsia. Does not bruise/bleed easily.  Psychiatric/Behavioral: Negative for depression, suicidal ideas, hallucinations, memory loss and substance abuse. The patient is not nervous/anxious and does not have insomnia.     Blood pressure 119/80, pulse 73, temperature 98.3 F (36.8 C), temperature source Oral, resp. rate 18, height 5\' 11"  (1.803 m), weight 86.637 kg (191 lb), last menstrual period 02/18/2012.Body mass index is 26.64 kg/(m^2).  General Appearance: Fairly Groomed  Patent attorney::  Good  Speech:  Clear and Coherent  Volume:  Normal  Mood:  Anxious  Affect:  Congruent  Thought Process:  Goal Directed  Orientation:  Full (Time, Place, and  Person)  Thought Content:  WDL  Suicidal Thoughts:  No  Homicidal Thoughts:  No  Memory:  Immediate;   Fair  Judgement:  Impaired  Insight:  Lacking  Psychomotor Activity:  Normal  Concentration:  Fair  Recall:  Fair  Akathisia:  No  Handed:  Right  AIMS (if indicated):     Assets:  Communication Skills Desire for Improvement Physical Health Resilience  Sleep:  Number of Hours: 4.25    Current Medications: Current Facility-Administered Medications  Medication Dose Route Frequency Provider Last Rate Last Dose  . acetaminophen (TYLENOL) tablet 650 mg  650 mg Oral Q6H PRN Kerry Hough, PA      . alum & mag hydroxide-simeth (MAALOX/MYLANTA) 200-200-20 MG/5ML suspension 30 mL  30 mL Oral Q4H PRN Kerry Hough, PA      . escitalopram (LEXAPRO) tablet 10 mg  10 mg Oral Daily Mojeed Akintayo   10 mg at 03/18/12 0806  . magnesium hydroxide (MILK OF MAGNESIA) suspension 30 mL  30 mL Oral Daily PRN Kerry Hough, PA      . OLANZapine zydis (ZYPREXA) disintegrating tablet 10 mg  10 mg Oral Q8H PRN Mojeed Akintayo      . QUEtiapine (SEROQUEL XR) 24 hr tablet 100 mg  100 mg Oral QPC supper Mojeed Akintayo   100 mg at 03/17/12 1704  . traZODone (DESYREL) tablet 50 mg  50 mg Oral QHS,MR X 1 Kerry Hough, PA   50 mg at 03/17/12 2330    Lab Results:  Results for orders placed during the hospital encounter of 03/17/12 (from the past 48 hour(s))  TSH     Status:  Normal   Collection Time   03/17/12  6:15 AM      Component Value Range Comment   TSH 2.679  0.350 - 4.500 uIU/mL   LIPID PANEL     Status: Abnormal   Collection Time   03/17/12  6:15 AM      Component Value Range Comment   Cholesterol 157  0 - 200 mg/dL    Triglycerides 57  <161 mg/dL    HDL 46  >09 mg/dL    Total CHOL/HDL Ratio 3.4      VLDL 11  0 - 40 mg/dL    LDL Cholesterol 604 (*) 0 - 99 mg/dL     Physical Findings: AIMS: Facial and Oral Movements Muscles of Facial Expression: None, normal Lips and Perioral  Area: None, normal Jaw: None, normal Tongue: None, normal,Extremity Movements Upper (arms, wrists, hands, fingers): None, normal Lower (legs, knees, ankles, toes): None, normal, Trunk Movements Neck, shoulders, hips: None, normal, Overall Severity Severity of abnormal movements (highest score from questions above): None, normal Incapacitation due to abnormal movements: None, normal Patient's awareness of abnormal movements (rate only patient's report): No Awareness, Dental Status Current problems with teeth and/or dentures?: No Does patient usually wear dentures?: No  CIWA:    COWS:     Treatment Plan Summary: Daily contact with patient to assess and evaluate symptoms and progress in treatment Medication management  Plan: 1. Continue current plan of care with Seroquel XR 24 100mg  at hs for psychosis. 2. Continue the Trazodone 50mg  po at hs for insomnia. 3. Continue the Lexapro 10mg  po qd for depression. 4. Continue current PRNs as written. 5. As patient continues to clear can anticipate discharge in 2-3 days. 6. Recommend CM schedule F/up prior to weekend incase she is able to d/c over the weekend.  Medical Decision Making Problem Points:  Established problem, stable/improving (1) and Review of psycho-social stressors (1) Data Points:  Order Aims Assessment (2) Review or order medicine tests (1) Review of medication regiment & side effects (2)  I certify that inpatient services furnished can reasonably be expected to improve the patient's condition.  Rona Ravens. Judiann Celia PAC 03/18/2012, 4:23 PM

## 2012-03-18 NOTE — Progress Notes (Signed)
[facility] LCSW Aftercare Discharge Planning Group Note  03/18/2012 9:45 AM  Participation Quality:  Attentive  Affect:  Appropriate  Cognitive:  Alert  Insight:  Improving  Engagement in Group:  Engaged  Modes of Intervention:  Discussion, Exploration and Socialization  Summary of Progress/Problems: [name] [name] is in a good mood today, and is exhibiting no signs of thought disorder.  She slept well and her appetite is good.  She visited with her godmother yesterday at lunch, and appreciates the fact that she has a good support system of "church family" and godmother.  Plans to take meds post d/c, and states "I can find the good in everyone."  Daryel Gerald B 03/18/2012, 9:45 AM

## 2012-03-18 NOTE — Progress Notes (Signed)
[facility] Group Notes:  (Counselor/Nursing/MHT/Case Management/Adjunct)  03/18/2012 11:51 AM   Type of Therapy:  Group Therapy  Participation Level:  Active  Participation Quality:  Appropriate and Supportive  Affect:  Appropriate  Cognitive:  Appropriate  Insight:  Engaged  Engagement in Group:  Engaged  Engagement in Therapy:  Engaged  Modes of Intervention:  Discussion, Socialization and Support  Summary of Progress/Problems: Topic-Balance: The topic for group was balance in life. Pt participated in the discussion about when their life was in balance and out of balance and how this feels. Pt discussed ways to get back in balance and discussed how putting herself first and leaning on her faith and church family for support help her achieve balance. Pt participated in group activity where she was asked to choose a picture card representing balance and imbalance in life. Pt talked about how her picture of barbed wire represented imbalance because of confinement and how her picture of a bride represented her hopes and dreams for the future--family and marriage is important to her and will help her to gain further balance in life.      Smart, Heather N 01/15/2012, 2:34 PM

## 2012-03-18 NOTE — Progress Notes (Signed)
Adult Psychoeducational Group Note  Date:  03/18/2012 Time: 0930 Group Topic/Focus:  Self Esteem Action Plan:   The focus of this group is to help patients create a plan to continue to build self-esteem after discharge.  Participation Level:  Active  Participation Quality:  Appropriate  Affect:  Appropriate  Cognitive:  Appropriate  Insight: Good  Engagement in Group:  Engaged  Modes of Intervention:  Education  Additional Comments:  Pt attended group this morning and shared positively.  Kore Madlock E 03/18/2012, 1:26 PM

## 2012-03-18 NOTE — ED Provider Notes (Signed)
Medical screening examination/treatment/procedure(s) were conducted as a shared visit with non-physician practitioner(s) and myself.  I personally evaluated the patient during the encounter  Toy Baker, MD 03/18/12 [phone]

## 2012-03-18 NOTE — Progress Notes (Signed)
D:  Patient up and in the milieu today.  Has attended groups and is taking all prescribed medications.  She rates depression at a 2 and denies thoughts of suicide or self harm.  She reports her sleep and appetite are good.   A:  Medications as prescribed.  Encouraged attendance at all groups.  15 minute safety checks maintained throughout the shift.  R:  Pleasant and cooperative.  Interacting well with staff and peers.  Tolerating current medications.

## 2012-03-18 NOTE — Progress Notes (Signed)
[facility] Group Notes:  (Nursing/MHT/Case Management/Adjunct)  Date:  03/18/2012  Time:  2:25 PM  Type of Therapy:  Psychoeducational Skills  Participation Level:  Did Not Attend  Summary of Progress/Problems: [name] [name] was sleeping did not attend psychoeducational group on relaxation.  Wandra Scot 03/18/2012, 2:25 PM

## 2012-03-18 NOTE — Progress Notes (Signed)
Psychoeducational Group Note  Date:  03/18/2012 Time:  2000 Group Topic/Focus:  Karaoke group  Participation Level: Did Not Attend  Participation Quality:  Not Applicable  Affect:  Not Applicable  Cognitive:  Not Applicable  Insight:  Not Applicable  Engagement in Group: Not Applicable  Additional Comments:    [name] [name] 03/18/2012, 10:14 PM

## 2012-03-18 NOTE — Progress Notes (Signed)
Patient ID: [name] [name], female   DOB: Apr 23, 1985, 27 y.o.   MRN: [id number] D:Pt. Reports "good day, got faith". Pt. Reports she had stopped taking her meds because she didn't have money, but now says she can go to [facility] to obtain them. Pt. Reports she has been going to groups "taking my medicine now and more aware of what's going on, around me" Pt.  Says she will take med when she goes home because she realizes she need it. "I had gotten so I would be riding and forget where I was going and would ride for hours." "I knew I wasn't right int he head". A: Writer introduced self to client and encouraged her to verbalize concerns. Writer provided positive support by listening. Staff will monitor q77min for safety. R: pt. Is safe on the unit. Pt. Appeared comfortable while sharing information with Clinical research associate. Pt. Has plan for discharge to return to aunts home and continue meds.

## 2012-03-19 MED ORDER — QUETIAPINE FUMARATE ER 200 MG PO TB24
200.0000 mg | ORAL_TABLET | Freq: Every day | ORAL | Status: DC
  Administered 2012-03-19: 200 mg via ORAL
  Filled 2012-03-19: qty 1
  Filled 2012-03-19 (×2): qty 14
  Filled 2012-03-19: qty 1

## 2012-03-19 NOTE — Progress Notes (Signed)
[facility] Group Notes:  (Counselor/Nursing/MHT/Case Management/Adjunct)  01/16/2012 12:00 PM  Type of Therapy:  Group Therapy  Participation Level:  Active  Participation Quality:  Appropriate and Attentive  Affect:  Appropriate  Cognitive:  Appropriate and Oriented  Insight:  Engaged and Supportive  Engagement in Group:  Engaged and Supportive  Engagement in Therapy:  Engaged and Supportive  Modes of Intervention:  Discussion, Exploration, Socialization and Support  Summary of Progress/Problems:Today's group involved a discussion activity--"the un-game" where patients were given a discussion question and asked to answer. Patient was encouraged to contribute to questions provided by other group members. The patient talked about a time when she lost her temper and shared a story where she attempted to help a young child get back on the right path and her good intentions were turned against her. The patient also provided support for another group member who talked about issues with family members and shared her strategies in dealing with difficult family members.   Smart, Heather N 01/16/2012, 12:00 PM

## 2012-03-19 NOTE — Progress Notes (Signed)
[facility] LCSW Aftercare Discharge Planning Group Note  03/19/2012 9:23 AM  Participation Quality:  Appropriate  Affect:  Appropriate  Cognitive:  Appropriate  Insight:  Engaged  Engagement in Group:  Engaged  Modes of Intervention:  Discussion, Problem-solving and Support  Summary of Progress/Problems: Pt reports that she "feels great" today and is ready to go home. Pt aware that she will follow up at [facility] for therapy. Pt signed releases for both and feels that she is ready to return to work. Per doctor, pt can be discharged Saturday.   Smart, Heather N 03/19/2012, 9:23 AM

## 2012-03-19 NOTE — Progress Notes (Signed)
Patient ID: [name] [name], female   DOB: May 04, 1985, 27 y.o.   MRN: [id number] [facility] MD Progress Note  03/19/2012 11:50 AM MARYAGNES CARRASCO  MRN:  [id number]  Subjective: "I came in here last Sunday morning. I started hallucinating because I was off of my medication for a long time. I did not consider the consequences of being off of my medicines because I was doing very well. I had all my faith in God, thinking that he will help me to stay well without my medications. But I have learned now to stick with taking my medicines from here on. My mood is good today. I'm sleeping well. No, I'm not hearing voices, seeing stuff and or suicidal".   Diagnosis:  Bipolar disorder with depression                       Cannabis abuse  ADL's:  Intact  Sleep: Good  Appetite:  Good  Suicidal Ideation: "No" Denies Homicidal Ideation: "No" Denies  AEB (as evidenced by): Per patient's reports.   Psychiatric Specialty Exam: Review of Systems  Constitutional: Negative.  Negative for fever, chills, weight loss, malaise/fatigue and diaphoresis.  HENT: Negative for congestion and sore throat.   Eyes: Negative for blurred vision, double vision and photophobia.  Respiratory: Negative for cough, shortness of breath and wheezing.   Cardiovascular: Negative for chest pain, palpitations and PND.  Gastrointestinal: Negative for heartburn, nausea, vomiting, abdominal pain, diarrhea and constipation.  Musculoskeletal: Negative for myalgias, joint pain and falls.  Neurological: Negative for dizziness, tingling, tremors, sensory change, speech change, focal weakness, seizures, loss of consciousness, weakness and headaches.  Endo/Heme/Allergies: Negative for polydipsia. Does not bruise/bleed easily.  Psychiatric/Behavioral: Positive for depression (Currently being stabilized with medication.). Negative for suicidal ideas, hallucinations, memory loss and substance abuse. The patient has insomnia (Currently being stabilized  with mediactions.). The patient is not nervous/anxious.     Blood pressure 119/80, pulse 73, temperature 98.3 F (36.8 C), temperature source Oral, resp. rate 18, height 5\' 11"  (1.803 m), weight 86.637 kg (191 lb), last menstrual period 02/18/2012.Body mass index is 26.64 kg/(m^2).  General Appearance: Fairly Groomed  Patent attorney::  Good  Speech:  Clear and Coherent  Volume:  Normal  Mood:  "I'm feeling better".  Affect:  Appropriate.  Thought Process:  Goal Directed  Orientation:  Full (Time, Place, and Person)  Thought Content:  Denies hallucinations, delusions and paranoia.  Suicidal Thoughts:  No  Homicidal Thoughts:  No  Memory:  Immediate;   Fair  Judgement:  Fair  Insight:  Fairly present  Psychomotor Activity:  Normal  Concentration:  Fair  Recall:  Fair  Akathisia:  No  Handed:  Right  AIMS (if indicated):     Assets:  Communication Skills Desire for Improvement Physical Health Resilience  Sleep:  Number of Hours: 5.75    Current Medications: Current Facility-Administered Medications  Medication Dose Route Frequency Provider Last Rate Last Dose  . acetaminophen (TYLENOL) tablet 650 mg  650 mg Oral Q6H PRN Kerry Hough, PA      . alum & mag hydroxide-simeth (MAALOX/MYLANTA) 200-200-20 MG/5ML suspension 30 mL  30 mL Oral Q4H PRN Kerry Hough, PA      . escitalopram (LEXAPRO) tablet 10 mg  10 mg Oral Daily Mojeed Akintayo   10 mg at 03/19/12 0756  . magnesium hydroxide (MILK OF MAGNESIA) suspension 30 mL  30 mL Oral Daily PRN Kerry Hough, PA      .  OLANZapine zydis (ZYPREXA) disintegrating tablet 10 mg  10 mg Oral Q8H PRN Mojeed Akintayo      . QUEtiapine (SEROQUEL XR) 24 hr tablet 200 mg  200 mg Oral QPC supper Mojeed Akintayo      . traZODone (DESYREL) tablet 50 mg  50 mg Oral QHS,MR X 1 Kerry Hough, PA   50 mg at 03/18/12 2122    Lab Results:  No results found for this or any previous visit (from the past 48 hour(s)).  Physical Findings: AIMS:  Facial and Oral Movements Muscles of Facial Expression: None, normal Lips and Perioral Area: None, normal Jaw: None, normal Tongue: None, normal,Extremity Movements Upper (arms, wrists, hands, fingers): None, normal Lower (legs, knees, ankles, toes): None, normal, Trunk Movements Neck, shoulders, hips: None, normal, Overall Severity Severity of abnormal movements (highest score from questions above): None, normal Incapacitation due to abnormal movements: None, normal Patient's awareness of abnormal movements (rate only patient's report): No Awareness, Dental Status Current problems with teeth and/or dentures?: No Does patient usually wear dentures?: No  CIWA:    COWS:     Treatment Plan Summary: Daily contact with patient to assess and evaluate symptoms and progress in treatment Medication management  Plan: Supportive approach/coping skills. Encouraged participation in group sessions. Will continue to monitor response to/adverse effects of medications in use to assure effectiveness. Continue to monitor mood & behavior. Continue current plan of care.   Medical Decision Making Problem Points:  Established problem, stable/improving (1) and Review of psycho-social stressors (1) Data Points:  Order Aims Assessment (2) Review or order medicine tests (1) Review of medication regiment & side effects (2)  I certify that inpatient services furnished can reasonably be expected to improve the patient's condition.  Rona Ravens. Mashburn PAC 03/19/2012, 11:50 AM

## 2012-03-19 NOTE — Progress Notes (Signed)
[facility] Adult Case Management Discharge Plan :  Will you be returning to the same living situation after discharge: Yes,  home At discharge, do you have transportation home?:Yes,  family Do you have the ability to pay for your medications:Yes,  mental health  Release of information consent forms completed and in the chart;  Patient's signature needed at discharge.  Patient to Follow up at: Follow-up Information    Follow up with [facility]. (Go to the walk-in clinic between 8 and 9AM Mon-Fri for your hospital follow up appointment)    Contact information:   [address]  [place]  [336] [phone]      [facility]). On 03/26/2012. (Appt at 1:00PM with Silver Huguenin for counseling)    Contact information:   [facility] [address], [place] [address]  phone: [phone] fax: [phone]         Patient denies SI/HI:   Yes,  yes    Safety Planning and Suicide Prevention discussed:  Yes,  yes  Ida Rogue 03/19/2012, 12:05 PM

## 2012-03-19 NOTE — Progress Notes (Signed)
Adult Psychoeducational Group Note  Date:  03/19/2012 Time: 930   Group Topic/Focus:  Relapse Prevention Planning:   The focus of this group is to define relapse and discuss the need for planning to combat relapse.  Participation Level:  Active  Participation Quality:  Appropriate, Attentive and Sharing  Affect:  Appropriate  Cognitive:  Appropriate  Insight: Appropriate  Engagement in Group:  Engaged  Modes of Intervention:  Education  Additional Comments:  Patient was very receptive to the information being provided to her.  Ardelle Park O 03/19/2012, 10:34 AM

## 2012-03-19 NOTE — Progress Notes (Signed)
D: Patient denies SI/HI and A/V hallucinations; patient reports sleep to be well; reports appetite to be good ; reports energy level is normal ; reports ability to pay attention to be good; rates depression as 0-1/10; rates hopelessness 0-1/10;   A: Monitored q 15 minutes; patient encouraged to attend groups; patient educated about medications; patient given medications per physician orders; patient encouraged to express feelings and/or concerns  R: Patient is calm and cooperative and appears to have some insight about treatment ; patient's interaction with staff and peers is appropriate and she can verbalized was she needs well;  patient is taking medications as prescribed and tolerating medications; patient is attending all groups

## 2012-03-20 MED ORDER — QUETIAPINE FUMARATE ER 200 MG PO TB24: 200.0000 mg | ORAL_TABLET | Freq: Every day | ORAL | Status: DC

## 2012-03-20 MED ORDER — TRAZODONE HCL 50 MG PO TABS: 50.0000 mg | ORAL_TABLET | Freq: Every evening | ORAL | Status: DC | PRN

## 2012-03-20 MED ORDER — ESCITALOPRAM OXALATE 10 MG PO TABS: 10.0000 mg | ORAL_TABLET | Freq: Every day | ORAL | Status: DC
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
## 2012-03-20 NOTE — Progress Notes (Signed)
Patient ID: [name] [name], female   DOB: 09-21-1985, 27 y.o.   MRN: [id number] Nursing d/c note- Patient appropriate during d/c process. All medication and f/u reviewed and f/u appts explained. Patient states pink carry-all bag is missing as well as blue jogging pants. Reported to assigned RN who will f/u. Denies SI and thought processes are coherent.  Escorted to lobby to care of family.

## 2012-03-20 NOTE — Progress Notes (Addendum)
03-20-12 @ 1331 pt stated that a pink tote bag with a ruffle, brand name "juicy couteur " was brought in by her family. Staff was unable to find it. She stated that "it doesn't matter" and was discharged without it. She stated ryan mht took the bag from her family member. If found she said contact her at her aunts #, cell ph # [phone]. When ryan came on at 41, rn checked with him and he stated their was no bag with that description brought in to the facility.

## 2012-03-20 NOTE — Progress Notes (Signed)
Psychoeducational Group Note  Date:  03/20/2012 Time:0930am  Group Topic/Focus:  Identifying Needs:   The focus of this group is to help patients identify their personal needs that have been historically problematic and identify healthy behaviors to address their needs.  Participation Level:  Active  Participation Quality:  Appropriate  Affect:  Appropriate  Cognitive:  Appropriate  Insight:  Supportive  Engagement in Group:  Supportive  Additional Comments:  Inventory sheet group   Valente David 03/20/2012,10:48 AM

## 2012-03-20 NOTE — [facility] Suicide Risk Assessment (Signed)
Suicide Risk Assessment  Discharge Assessment     Demographic Factors:  Low socioeconomic status  Mental Status Per Nursing Assessment::   On Admission:   (denies any si/hi/av. )  Current Mental Status by Physician: General Appearance: Fairly Groomed  Eye Contact:: Good  Speech: Clear and Coherent  Volume: Normal  Mood: good  Affect: Appropriate.  Thought Process: Goal Directed  Orientation: Full (Time, Place, and Person)  Thought Content: Denies hallucinations, delusions and paranoia.  Suicidal Thoughts: No  Homicidal Thoughts: No  Memory: Immediate; Fair  Judgement: Fair  Insight: Fairly present  Psychomotor Activity: Normal  Concentration: Fair  Recall: Fair  Akathisia: No    Loss Factors: NA  Historical Factors: Prior suicide attempts  Risk Reduction Factors:   Living with another person, especially a relative  Continued Clinical Symptoms:  Bipolar Disorder:   Depressive phase  Cognitive Features That Contribute To Risk:  Closed-mindedness    Suicide Risk:  Minimal: No identifiable suicidal ideation.  Patients presenting with no risk factors but with morbid ruminations; may be classified as minimal risk based on the severity of the depressive symptoms  Discharge Diagnoses:   AXIS I:  Bipolar, Depressed AXIS II:  Deferred AXIS III:   Past Medical History  Diagnosis Date  . Tumor of ovary   . Depression   . Headache   . Boils   . Bipolar 1 disorder, depressed stated by patient  . Polycystic ovarian syndrome    AXIS IV:  other psychosocial or environmental problems AXIS V:  51-60 moderate symptoms  Plan Of Care/Follow-up recommendations:  Activity:  [facility]  Is patient on multiple antipsychotic therapies at discharge:  No   Has Patient had three or more failed trials of antipsychotic monotherapy by history:  No  Recommended Plan for Multiple Antipsychotic Therapies: Additional reason(s) for multiple antispychotic treatment:  n/a  Wonda Cerise 03/20/2012, 11:02 AM

## 2012-03-20 NOTE — Progress Notes (Signed)
Patient ID: [name] [name], female   DOB: 04-29-1985, 27 y.o.   MRN: [id number] D. The patient has a neutral mood and blunted affect.  Denies any a/v hallucinations. Denies any suicidal ideation. Interacting in the milieu. A. Met with patient. Encouraged to attend evening wrap up group. Administered medication. R. Did not attend evening group. Compliant with medication.

## 2012-03-20 NOTE — Clinical Social Work Psychosocial (Signed)
[facility] Group Notes:  (Clinical Social Work)  03/20/2012  11:15-11:45AM  Summary of Progress/Problems:   The main focus of today's process group was for the patient to identify ways in which they have in the past sabotaged their own recovery and reasons they may have done this/what they received from doing it.  We then worked to identify a specific plan to avoid doing this when discharged from the hospital for this admission.  The patient was late to group as she was preparing for discharge.  She expressed being 100% motivated for change.  She said if she puts in 100%, God will put in 100%.  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  She talked about a song she wrote during a period of feeling isolated, and CSW asked her to sing to the group.  It was quite lovely and touching, and after singing, she gave a parting word to each group member that was very specific and intentional for them.  Type of Therapy:  Group Therapy - Process  Participation Level:  Active  Participation Quality:  Appropriate, Attentive, Sharing and Supportive  Affect:  Appropriate  Cognitive:  Alert, Appropriate and Oriented  Insight:  Engaged  Engagement in Therapy:  Engaged  Modes of Intervention:  Clarification, Education, Limit-setting, Problem-solving, Socialization, Support and Processing, Exploration, Discussion   Ambrose Mantle, LCSW 03/20/2012, 1:06 PM

## 2012-03-20 NOTE — Progress Notes (Signed)
[facility] Group Notes:  (Nursing/MHT/Case Management/Adjunct)  Date:  03/19/2012 Time:  2000  Type of Therapy:  Psychoeducational Skills  Participation Level:  Active  Participation Quality:  Appropriate  Affect:  Appropriate  Cognitive:  Appropriate  Insight:  Good  Engagement in Group:  Engaged  Modes of Intervention:  Education  Summary of Progress/Problems: The patient stated that she had a good day today. Her goal for tomorrow is to get discharged which is what she anticipates. She feels that one thing that she learned while here in the hospital is the following: "never judge a book by its cover".   Hazle Coca S 03/20/2012, 2:37 AM

## 2012-03-20 NOTE — Progress Notes (Signed)
[facility] Adult Case Management Discharge Plan :  Will you be returning to the same living situation after discharge: Yes,   At discharge, do you have transportation home?:Yes,   Do you have the ability to pay for your medications:Yes,    Release of information consent forms completed and in the chart;  Patient's signature needed at discharge.  Patient to Follow up at: Follow-up Information    Follow up with [facility]. (Go to the walk-in clinic between 8 and 9AM Mon-Fri for your hospital follow up appointment)    Contact information:   [address]  [place]  [336] [phone]      [facility] with [facility]). On 03/26/2012. (Appt at 1:00PM with [name] [name] for counseling)    Contact information:   [facility] [address] [place] [address]  phone: [phone] fax: [phone]         Patient denies SI/HI:   Yes,      Safety Planning and Suicide Prevention discussed:  Yes,    [name] 03/20/2012, 6:07 PM

## 2012-03-20 NOTE — Progress Notes (Signed)
03-20-12 @ 1100 nursing shift note: D: pt has been visible, taking medication, eating meals and going to group. A: when asked how she was doing, she stated "fine". She stated she was ready for d/c. R: advised her staff would d/c her promptly when the time was appropriate. rn will monitor and q 15 min cks continue.

## 2012-03-22 ENCOUNTER — Telehealth (HOSPITAL_COMMUNITY): Payer: Self-pay | Admitting: *Deleted

## 2012-03-23 NOTE — Progress Notes (Signed)
Patient Discharge Instructions:  After Visit Summary (AVS):   Faxed to:  03/23/12 Discharge Summary Note:   Faxed to:  03/23/12 Psychiatric Admission Assessment Note:   Faxed to:  03/23/12 Suicide Risk Assessment - Discharge Assessment:   Faxed to:  03/23/12 Faxed/Sent to the Next Level Care provider:  03/23/12 Faxed to [facility] @ [phone] Faxed to [facility] @ [phone]  Jerelene Redden, 03/23/2012, 4:10 PM

## 2012-03-29 ENCOUNTER — Encounter (HOSPITAL_COMMUNITY): Payer: Self-pay

## 2012-03-29 ENCOUNTER — Emergency Department (HOSPITAL_COMMUNITY)
Admission: EM | Admit: 2012-03-29 | Discharge: 2012-03-29 | Disposition: A | Discharge status: Home / Self Care | Payer: Self-pay | Attending: Emergency Medicine | Admitting: Emergency Medicine

## 2012-03-29 DIAGNOSIS — R45851 Suicidal ideations: Secondary | ICD-10-CM | POA: Insufficient documentation

## 2012-03-29 DIAGNOSIS — R5383 Other fatigue: Secondary | ICD-10-CM | POA: Insufficient documentation

## 2012-03-29 DIAGNOSIS — Z8679 Personal history of other diseases of the circulatory system: Secondary | ICD-10-CM | POA: Insufficient documentation

## 2012-03-29 DIAGNOSIS — R109 Unspecified abdominal pain: Secondary | ICD-10-CM | POA: Insufficient documentation

## 2012-03-29 DIAGNOSIS — Z0289 Encounter for other administrative examinations: Secondary | ICD-10-CM | POA: Insufficient documentation

## 2012-03-29 DIAGNOSIS — F22 Delusional disorders: Secondary | ICD-10-CM | POA: Insufficient documentation

## 2012-03-29 DIAGNOSIS — Z872 Personal history of diseases of the skin and subcutaneous tissue: Secondary | ICD-10-CM | POA: Insufficient documentation

## 2012-03-29 DIAGNOSIS — R5381 Other malaise: Secondary | ICD-10-CM | POA: Insufficient documentation

## 2012-03-29 DIAGNOSIS — Z8742 Personal history of other diseases of the female genital tract: Secondary | ICD-10-CM | POA: Insufficient documentation

## 2012-03-29 DIAGNOSIS — Z008 Encounter for other general examination: Secondary | ICD-10-CM

## 2012-03-29 DIAGNOSIS — K644 Residual hemorrhoidal skin tags: Secondary | ICD-10-CM | POA: Insufficient documentation

## 2012-03-29 DIAGNOSIS — F17200 Nicotine dependence, unspecified, uncomplicated: Secondary | ICD-10-CM | POA: Insufficient documentation

## 2012-03-29 DIAGNOSIS — F309 Manic episode, unspecified: Secondary | ICD-10-CM | POA: Insufficient documentation

## 2012-03-29 LAB — CBC WITH DIFFERENTIAL/PLATELET
Basophils Relative: 0 % (ref 0–1)
Eosinophils Absolute: 0.2 10*3/uL (ref 0.0–0.7)
HCT: 42 % (ref 36.0–46.0)
Hemoglobin: 14.3 g/dL (ref 12.0–15.0)
Lymphs Abs: 2.2 10*3/uL (ref 0.7–4.0)
MCH: 32.3 pg (ref 26.0–34.0)
MCHC: 34 g/dL (ref 30.0–36.0)
Monocytes Absolute: 0.6 10*3/uL (ref 0.1–1.0)
Monocytes Relative: 9 % (ref 3–12)
Neutrophils Relative %: 56 % (ref 43–77)
RBC: 4.43 MIL/uL (ref 3.87–5.11)
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
## 2012-03-29 NOTE — ED Notes (Signed)
Patient brought in with [place] [employer] from [place] and is to return to [place] when medically cleared. Patient has IVC papers. Patient has a history of manic depression, bipolar and schizophrenia. [place] very little clothing on. Patient, according to Vesta Mixer was c/o anal pain and may by unaware due to mental state.  IVC papers state that the patient is a danger to herself.

## 2012-03-30 NOTE — ED Provider Notes (Signed)
Medical screening examination/treatment/procedure(s) were performed by non-physician practitioner and as supervising physician I was immediately available for consultation/collaboration.   Kalonji Zurawski L Caila Cirelli, MD 03/30/12 1247 

## 2012-04-03 ENCOUNTER — Emergency Department (HOSPITAL_COMMUNITY)
Admission: EM | Admit: 2012-04-03 | Discharge: 2012-04-06 | Disposition: A | Discharge status: Other Acute Inpatient Hospital | Payer: Self-pay | Attending: Emergency Medicine | Admitting: Emergency Medicine

## 2012-04-03 ENCOUNTER — Encounter (HOSPITAL_COMMUNITY): Payer: Self-pay | Admitting: *Deleted

## 2012-04-03 ENCOUNTER — Other Ambulatory Visit: Payer: Self-pay

## 2012-04-03 DIAGNOSIS — Z8742 Personal history of other diseases of the female genital tract: Secondary | ICD-10-CM | POA: Insufficient documentation

## 2012-04-03 DIAGNOSIS — R45851 Suicidal ideations: Secondary | ICD-10-CM | POA: Insufficient documentation

## 2012-04-03 DIAGNOSIS — Z3202 Encounter for pregnancy test, result negative: Secondary | ICD-10-CM | POA: Insufficient documentation

## 2012-04-03 DIAGNOSIS — F17200 Nicotine dependence, unspecified, uncomplicated: Secondary | ICD-10-CM | POA: Insufficient documentation

## 2012-04-03 DIAGNOSIS — T1491XA Suicide attempt, initial encounter: Secondary | ICD-10-CM

## 2012-04-03 DIAGNOSIS — Z79899 Other long term (current) drug therapy: Secondary | ICD-10-CM | POA: Insufficient documentation

## 2012-04-03 DIAGNOSIS — F3130 Bipolar disorder, current episode depressed, mild or moderate severity, unspecified: Secondary | ICD-10-CM | POA: Insufficient documentation

## 2012-04-03 DIAGNOSIS — X80XXXA Intentional self-harm by jumping from a high place, initial encounter: Secondary | ICD-10-CM | POA: Insufficient documentation

## 2012-04-03 LAB — POCT PREGNANCY, URINE: Preg Test, Ur: NEGATIVE

## 2012-04-03 LAB — CBC WITH DIFFERENTIAL/PLATELET
Eosinophils Absolute: 0.1 10*3/uL (ref 0.0–0.7)
Eosinophils Relative: 1 % (ref 0–5)
HCT: 37 % (ref 36.0–46.0)
Lymphocytes Relative: 20 % (ref 12–46)
Lymphs Abs: 1.9 10*3/uL (ref 0.7–4.0)
MCH: 32.6 pg (ref 26.0–34.0)
MCV: 94.1 fL (ref 78.0–100.0)
Monocytes Absolute: 0.7 10*3/uL (ref 0.1–1.0)
Platelets: 228 10*3/uL (ref 150–400)
RBC: 3.93 MIL/uL (ref 3.87–5.11)
WBC: 9.4 10*3/uL (ref 4.0–10.5)

## 2012-04-03 LAB — RAPID URINE DRUG SCREEN, HOSP PERFORMED
Barbiturates: NOT DETECTED
Benzodiazepines: POSITIVE — AB
Cocaine: NOT DETECTED
Opiates: NOT DETECTED

## 2012-04-03 LAB — HCG, SERUM, QUALITATIVE: Preg, Serum: NEGATIVE

## 2012-04-03 LAB — COMPREHENSIVE METABOLIC PANEL
BUN: 9 mg/dL (ref 6–23)
CO2: 25 mEq/L (ref 19–32)
Calcium: 9.2 mg/dL (ref 8.4–10.5)
Creatinine, Ser: 1.07 mg/dL (ref 0.50–1.10)
GFR calc Af Amer: 82 mL/min — ABNORMAL LOW (ref >90)
GFR calc non Af Amer: 70 mL/min — ABNORMAL LOW (ref >90)
Glucose, Bld: 82 mg/dL (ref 70–99)
Total Protein: 7.2 g/dL (ref 6.0–8.3)

## 2012-04-03 LAB — ETHANOL: Alcohol, Ethyl (B): <11 mg/dL (ref 0–11)

## 2012-04-03 MED ORDER — LORAZEPAM 2 MG/ML IJ SOLN
INTRAMUSCULAR | Status: AC
  Filled 2012-04-03: qty 1

## 2012-04-03 MED ORDER — QUETIAPINE FUMARATE ER 200 MG PO TB24
200.0000 mg | ORAL_TABLET | Freq: Every day | ORAL | Status: DC
  Administered 2012-04-03: 200 mg via ORAL
  Filled 2012-04-03: qty 1

## 2012-04-03 MED ORDER — LORAZEPAM 2 MG/ML IJ SOLN
2.0000 mg | Freq: Once | INTRAMUSCULAR | Status: AC
Start: 2012-04-03 — End: 2012-04-03
  Administered 2012-04-03: 2 mg via INTRAMUSCULAR
  Filled 2012-04-03: qty 1

## 2012-04-03 MED ORDER — DIVALPROEX SODIUM 500 MG PO DR TAB
500.0000 mg | DELAYED_RELEASE_TABLET | Freq: Two times a day (BID) | ORAL | Status: DC
  Administered 2012-04-03 – 2012-04-05 (×3): 500 mg via ORAL
  Filled 2012-04-03 (×5): qty 1

## 2012-04-03 MED ORDER — TRAZODONE HCL 50 MG PO TABS
50.0000 mg | ORAL_TABLET | Freq: Every evening | ORAL | Status: DC | PRN
  Administered 2012-04-03 – 2012-04-05 (×2): 50 mg via ORAL
  Filled 2012-04-03 (×3): qty 1

## 2012-04-03 MED ORDER — ACETAMINOPHEN 325 MG PO TABS: 650.0000 mg | ORAL_TABLET | ORAL | Status: DC | PRN

## 2012-04-03 MED ORDER — QUETIAPINE FUMARATE ER 300 MG PO TB24
300.0000 mg | ORAL_TABLET | Freq: Every day | ORAL | Status: DC
  Filled 2012-04-03 (×2): qty 1

## 2012-04-03 MED ORDER — ESCITALOPRAM OXALATE 10 MG PO TABS
10.0000 mg | ORAL_TABLET | Freq: Every day | ORAL | Status: DC
  Administered 2012-04-04 – 2012-04-05 (×2): 10 mg via ORAL
  Filled 2012-04-03 (×3): qty 1

## 2012-04-03 MED ORDER — NICOTINE 21 MG/24HR TD PT24
21.0000 mg | MEDICATED_PATCH | Freq: Every day | TRANSDERMAL | Status: DC
  Filled 2012-04-03: qty 1

## 2012-04-03 NOTE — ED Notes (Signed)
Telepsych info called and faxed.
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
## 2012-04-03 NOTE — ED Notes (Signed)
I placed the following into patient belonging bags: leggings, bra, tank tops, socks, shoes, coat. The two bags are placed at the nursing station

## 2012-04-03 NOTE — ED Notes (Signed)
OZH:[id number]<HQ> Expected date:04/03/12<BR> Expected time: 5:27 PM<BR> Means of arrival:Ambulance<BR> Comments:<BR> Medical Clearance

## 2012-04-03 NOTE — ED Notes (Signed)
Pt using the phone currently. She's been up to the nurses station several times talking loudly, non-sensical, flight of ideas, disorganized thoughts.

## 2012-04-03 NOTE — ED Notes (Signed)
2 bags of belongings locked in locker # 42

## 2012-04-03 NOTE — ED Notes (Signed)
Pt had negative pregnancy test completed 1/13, when asked patient questions about alleged pregnancy pt will not give direct answers.

## 2012-04-03 NOTE — ED Notes (Signed)
Tech in doing EKG on pt.

## 2012-04-03 NOTE — ED Notes (Signed)
Called lab to add serum pregnancy test; rep said he'd take care of it.

## 2012-04-03 NOTE — ED Notes (Signed)
telepsych completed 

## 2012-04-03 NOTE — ED Notes (Signed)
Dr Ethelda Chick called to see if we got EKG done on pt yet. Told him now that she's sleepy and gave urine sample we will try to get EKG in the next 30 minutes.

## 2012-04-04 MED ORDER — LORAZEPAM 2 MG/ML IJ SOLN
2.0000 mg | Freq: Once | INTRAMUSCULAR | Status: AC
  Administered 2012-04-04: 2 mg via INTRAMUSCULAR
  Filled 2012-04-04: qty 1

## 2012-04-04 MED ORDER — ZIPRASIDONE MESYLATE 20 MG IM SOLR
10.0000 mg | Freq: Once | INTRAMUSCULAR | Status: AC
  Administered 2012-04-04: 10 mg via INTRAMUSCULAR
  Filled 2012-04-04: qty 20

## 2012-04-04 NOTE — ED Notes (Signed)
Dr. Denton Lank made aware pt remains agitated after ativan 2 mg IM; Geodon 10mg  IM ordered and given.  RN continuing to monitor.

## 2012-04-04 NOTE — ED Notes (Signed)
Pt up to nursing station writing in crayon on papers against the window.
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
## 2012-04-04 NOTE — ED Notes (Signed)
Pt calm, cooperative, very talkative; pt denies SI/HI; pt having disorganized thinking and paranoid thoughts.

## 2012-04-04 NOTE — ED Provider Notes (Addendum)
Resting, nad, vitals normal. Discussed w act team, placement pending.    Suzi Roots, MD 04/04/12 [phone]   Nurses noted periods agitation, acting out.  Pt afeb. Vitals normal.  Alert, agitated, irrational, not cooperative. intial dose ativan and reassurance/support offered by staff. Pt remains agitated, uncooperative. geodon im.    Signed out to Dr Oletta Cohn.     Suzi Roots, MD 04/04/12 1556

## 2012-04-04 NOTE — ED Notes (Signed)
Pt very agitated, manic, yelling out, repeatedly pushing code blue button; Dr. Denton Lank made aware, ativan 2mg  IM ordered and given.  RN continuing to monitor.

## 2012-04-04 NOTE — ED Notes (Signed)
Patient drowsy, withdrawn. No distress noted.

## 2012-04-05 MED ORDER — LORAZEPAM 1 MG PO TABS: 1.0000 mg | ORAL_TABLET | Freq: Four times a day (QID) | ORAL | Status: DC | PRN

## 2012-04-05 MED ORDER — LORAZEPAM 1 MG PO TABS
2.0000 mg | ORAL_TABLET | Freq: Once | ORAL | Status: AC
  Administered 2012-04-05: 2 mg via ORAL
  Filled 2012-04-05: qty 2

## 2012-04-05 MED ORDER — WHITE PETROLATUM GEL
Status: AC
  Administered 2012-04-05: 1
  Filled 2012-04-05: qty 15

## 2012-04-05 MED ORDER — LORAZEPAM 2 MG/ML IJ SOLN: 1.0000 mg | Freq: Four times a day (QID) | INTRAMUSCULAR | Status: DC | PRN

## 2012-04-05 MED ORDER — QUETIAPINE FUMARATE 300 MG PO TABS
300.0000 mg | ORAL_TABLET | Freq: Two times a day (BID) | ORAL | Status: DC
  Administered 2012-04-05 (×2): 300 mg via ORAL
  Filled 2012-04-05 (×2): qty 1

## 2012-04-05 NOTE — ED Notes (Signed)
Pt requesting medium paper scrubs to replace the ones she has on. RN provided patient with appropriate fitting scrubs. Pt becoming angry and stated "Now the cops will think that I am a boy because these aren't tight enough!" RN informed patient that tight scrubs not proper to wear on unit and would not be given. Pt agitated and returned to her room after putting new scrubs on.

## 2012-04-05 NOTE — ED Provider Notes (Addendum)
Patient is currently up walking around today speaking with the nursing staff but is calm and cooperative. She was evaluated yesterday and recommended to have inpatient treatment. Patient is IVC and currently is being review back, [facility]. Again this morning she has been calm and cooperative in his speaking with other patients but is easily directable. She continues to have psychosis but denies any suicidal ideation at this time.  Gwyneth Sprout, MD 04/05/12 [phone]   2:34 PM Telepsych saw pt and still recommending inpt treatment.  D/c lexapro and increase seroquel to 300mg  po bid.  Still looking for placement.  Gwyneth Sprout, MD 04/05/12 1434
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
## 2012-04-05 NOTE — ED Notes (Signed)
Patient up to nursing station, hyper verbal and with flight of ideas. Pt states "I was taught not to trust white people". Pt difficult to redirect back to her room. No distress noted.

## 2012-04-05 NOTE — BHH Counselor (Signed)
2/3 Pt referred to [facility]; pending review.   2/3 Pt referred to [facility], [place], [place], [place], [place], [place], & [facility]; pending review.   2/3 No beds at [facility], [address], [place], [address], [address], [place], [place], and [place].

## 2012-04-05 NOTE — ED Notes (Signed)
Chalisa doesn't want any phone calls or vistors by the name of thomas edward cheek

## 2012-04-06 NOTE — ED Notes (Signed)
Patient up walking to nurses station requesting something to help with sleep. Patient appears groggy and walking with a unsteady gait. Patient walked by to her room by Clinical research associate and informed to call nurses station if she helps to restroom. Patient also informed that she was too unsteady to get ant other sedated medications. Patient verbalized understanding.

## 2012-04-06 NOTE — ED Notes (Signed)
Patient transported via [employer]

## 2012-04-06 NOTE — ED Notes (Signed)
Patient up to restroom.

## 2012-04-06 NOTE — BHH Counselor (Signed)
This Clinical research associate recv'd call from Rachel([facility]), pt has been accepted by Dr. Malvin Johns for treatment.  Call Unit Report [phone]

## 2012-04-06 NOTE — ED Notes (Signed)
[employer] transported contacted and informed that patient needed to be transported to [facility].

## 2012-04-12 ENCOUNTER — Emergency Department (HOSPITAL_COMMUNITY)
Admission: EM | Admit: 2012-04-12 | Discharge: 2012-04-12 | Disposition: A | Discharge status: Home / Self Care | Payer: Self-pay | Attending: Emergency Medicine | Admitting: Emergency Medicine

## 2012-04-12 ENCOUNTER — Encounter (HOSPITAL_COMMUNITY): Payer: Self-pay | Admitting: Emergency Medicine

## 2012-04-12 DIAGNOSIS — Z3202 Encounter for pregnancy test, result negative: Secondary | ICD-10-CM | POA: Insufficient documentation

## 2012-04-12 DIAGNOSIS — Z8679 Personal history of other diseases of the circulatory system: Secondary | ICD-10-CM | POA: Insufficient documentation

## 2012-04-12 DIAGNOSIS — F319 Bipolar disorder, unspecified: Secondary | ICD-10-CM | POA: Insufficient documentation

## 2012-04-12 DIAGNOSIS — Z872 Personal history of diseases of the skin and subcutaneous tissue: Secondary | ICD-10-CM | POA: Insufficient documentation

## 2012-04-12 DIAGNOSIS — Z79899 Other long term (current) drug therapy: Secondary | ICD-10-CM | POA: Insufficient documentation

## 2012-04-12 DIAGNOSIS — Z8742 Personal history of other diseases of the female genital tract: Secondary | ICD-10-CM | POA: Insufficient documentation

## 2012-04-12 DIAGNOSIS — F17200 Nicotine dependence, unspecified, uncomplicated: Secondary | ICD-10-CM | POA: Insufficient documentation

## 2012-04-12 DIAGNOSIS — Z3200 Encounter for pregnancy test, result unknown: Secondary | ICD-10-CM

## 2012-04-12 NOTE — ED Provider Notes (Signed)
Medical screening examination/treatment/procedure(s) were performed by non-physician practitioner and as supervising physician I was immediately available for consultation/collaboration.   Lyanne Co, MD 04/12/12 [phone]

## 2012-04-12 NOTE — ED Provider Notes (Signed)
History  This chart was scribed for non-physician practitioner, Magnus Sinning, PA-C working with Lyanne Co, MD by Shari Heritage, ED Scribe. This patient was seen in room WTR4/[facility]PT4 and the patient's care was started at 2110.  CSN: [id number]  Arrival date & time 04/12/12  2023   First MD Initiated Contact with Patient 04/12/12 2110      Chief Complaint  Patient presents with  . Possible Pregnancy     The history is provided by the patient. No language interpreter was used.    HPI Comments: [name] [name] is a 27 y.o. female who presents to the Emergency Department here with possible pregnancy. Patient was sent from [facility] for a pregnancy test so she can start a new medicine regimen. Plan is for patient to be discharged back to [facility] once pregnancy test is completed.  Patient has no other complaints. She denies any symptoms at this time. Patient's LMP was 02/18/2012. Patient says the last time she had intercourse was 02/26/2012. Patient states that she has a history of polycystic ovarian syndrome  Past Medical History  Diagnosis Date  . Tumor of ovary   . Depression   . Headache   . Boils   . Bipolar 1 disorder, depressed stated by patient  . Polycystic ovarian syndrome     Past Surgical History  Procedure Laterality Date  . Tumor removal      No family history on file.  History  Substance Use Topics  . Smoking status: Current Every Day Smoker -- 0.25 packs/day    Types: Cigarettes  . Smokeless tobacco: Not on file  . Alcohol Use: Yes     Comment: social    OB History   Grav Para Term Preterm Abortions TAB SAB Ect Mult Living   0               Review of Systems  Constitutional: Negative for fever and chills.  Gastrointestinal: Negative for nausea and vomiting.  All other systems reviewed and are negative.    Allergies  Ibuprofen  Home Medications   Current Outpatient Rx  Name  Route  Sig  Dispense  Refill  . escitalopram (LEXAPRO) 10 MG  tablet   Oral   Take 1 tablet (10 mg total) by mouth daily. For depression   30 tablet   0   . QUEtiapine (SEROQUEL XR) 200 MG 24 hr tablet   Oral   Take 1 tablet (200 mg total) by mouth daily after supper. For mood control   30 tablet   0   . traZODone (DESYREL) 50 MG tablet   Oral   Take 1 tablet (50 mg total) by mouth at bedtime and may repeat dose one time if needed. For sleep   60 tablet   0     Triage Vitals: BP 128/81  Pulse 62  Temp(Src) 98.2 F (36.8 C) (Oral)  Resp 18  SpO2 99%  LMP 02/18/2012  Physical Exam  Nursing note and vitals reviewed. Constitutional: She appears well-developed and well-nourished.  HENT:  Head: Normocephalic and atraumatic.  Mouth/Throat: Oropharynx is clear and moist.  Eyes: EOM are normal. Pupils are equal, round, and reactive to light.  Neck: Normal range of motion. Neck supple.  Cardiovascular: Normal rate, regular rhythm and normal heart sounds.   Pulmonary/Chest: Effort normal and breath sounds normal. She has no wheezes.  Abdominal: Soft. She exhibits no mass. There is no tenderness.  Musculoskeletal: Normal range of motion.  Neurological: She is  alert.  Skin: Skin is warm and dry.  Psychiatric: Her behavior is normal. Her mood appears anxious. Her speech is rapid and/or pressured.    ED Course  Procedures (including critical care time) DIAGNOSTIC STUDIES: Oxygen Saturation is 99% on room air, normal by my interpretation.    COORDINATION OF CARE: 9:26 PM- Patient informed of current plan for treatment and evaluation and agrees with plan at this time.   Results for orders placed during the hospital encounter of 04/12/12  POCT PREGNANCY, URINE      Result Value Range   Preg Test, Ur NEGATIVE  NEGATIVE    No results found.   No diagnosis found.    MDM  Patient presents today from [facility] to obtain a pregnancy test.  Pregnancy test negative.  Patient discharged back to [facility].  I personally performed the  services described in this documentation, which was scribed in my presence. The recorded information has been reviewed and is accurate.    Pascal Lux [place], PA-C 04/12/12 2256

## 2012-04-12 NOTE — ED Notes (Signed)
Patient sent back to [facility] per the MD orders

## 2012-04-12 NOTE — ED Notes (Signed)
Pt was sent over form [facility] for a preg test to start new med reg. VSS. Security at bedside

## 2012-04-13 ENCOUNTER — Emergency Department (HOSPITAL_COMMUNITY)
Admission: EM | Admit: 2012-04-13 | Discharge: 2012-04-14 | Disposition: A | Discharge status: Home / Self Care | Payer: Self-pay | Attending: Emergency Medicine | Admitting: Emergency Medicine

## 2012-04-13 ENCOUNTER — Encounter (HOSPITAL_COMMUNITY): Payer: Self-pay

## 2012-04-13 DIAGNOSIS — Z3202 Encounter for pregnancy test, result negative: Secondary | ICD-10-CM | POA: Insufficient documentation

## 2012-04-13 DIAGNOSIS — Z8742 Personal history of other diseases of the female genital tract: Secondary | ICD-10-CM | POA: Insufficient documentation

## 2012-04-13 DIAGNOSIS — Z872 Personal history of diseases of the skin and subcutaneous tissue: Secondary | ICD-10-CM | POA: Insufficient documentation

## 2012-04-13 DIAGNOSIS — Z79899 Other long term (current) drug therapy: Secondary | ICD-10-CM | POA: Insufficient documentation

## 2012-04-13 DIAGNOSIS — F17200 Nicotine dependence, unspecified, uncomplicated: Secondary | ICD-10-CM | POA: Insufficient documentation

## 2012-04-13 DIAGNOSIS — F319 Bipolar disorder, unspecified: Secondary | ICD-10-CM | POA: Insufficient documentation

## 2012-04-13 LAB — BASIC METABOLIC PANEL
BUN: 11 mg/dL (ref 6–23)
CO2: 22 mEq/L (ref 19–32)
Calcium: 8.9 mg/dL (ref 8.4–10.5)
Creatinine, Ser: 0.96 mg/dL (ref 0.50–1.10)
Glucose, Bld: 115 mg/dL — ABNORMAL HIGH (ref 70–99)
Sodium: 140 mEq/L (ref 135–145)

## 2012-04-13 LAB — CBC
HCT: 38 % (ref 36.0–46.0)
Hemoglobin: 12.8 g/dL (ref 12.0–15.0)
MCH: 31.6 pg (ref 26.0–34.0)
MCV: 93.8 fL (ref 78.0–100.0)
RBC: 4.05 MIL/uL (ref 3.87–5.11)

## 2012-04-13 LAB — RAPID URINE DRUG SCREEN, HOSP PERFORMED
Amphetamines: NOT DETECTED
Barbiturates: NOT DETECTED
Opiates: NOT DETECTED
Tetrahydrocannabinol: POSITIVE — AB

## 2012-04-13 LAB — VALPROIC ACID LEVEL: Valproic Acid Lvl: <10 ug/mL — ABNORMAL LOW (ref 50.0–100.0)

## 2012-04-13 MED ORDER — ESCITALOPRAM OXALATE 10 MG PO TABS
10.0000 mg | ORAL_TABLET | Freq: Every day | ORAL | Status: DC
  Administered 2012-04-13 – 2012-04-14 (×2): 10 mg via ORAL
  Filled 2012-04-13 (×2): qty 1

## 2012-04-13 MED ORDER — ZIPRASIDONE MESYLATE 20 MG IM SOLR
20.0000 mg | Freq: Once | INTRAMUSCULAR | Status: AC
  Administered 2012-04-13: 20 mg via INTRAMUSCULAR

## 2012-04-13 MED ORDER — QUETIAPINE FUMARATE ER 300 MG PO TB24
300.0000 mg | ORAL_TABLET | Freq: Every day | ORAL | Status: DC
  Filled 2012-04-13 (×2): qty 1

## 2012-04-13 MED ORDER — TRAZODONE HCL 50 MG PO TABS: 50.0000 mg | ORAL_TABLET | Freq: Every evening | ORAL | Status: DC | PRN

## 2012-04-13 MED ORDER — QUETIAPINE FUMARATE ER 300 MG PO TB24: 300.0000 mg | ORAL_TABLET | Freq: Two times a day (BID) | ORAL | Status: DC

## 2012-04-13 MED ORDER — NICOTINE 21 MG/24HR TD PT24
21.0000 mg | MEDICATED_PATCH | Freq: Every day | TRANSDERMAL | Status: DC
  Filled 2012-04-13: qty 1

## 2012-04-13 MED ORDER — ZIPRASIDONE MESYLATE 20 MG IM SOLR
INTRAMUSCULAR | Status: AC
  Filled 2012-04-13: qty 20

## 2012-04-13 MED ORDER — DIVALPROEX SODIUM 500 MG PO DR TAB
500.0000 mg | DELAYED_RELEASE_TABLET | Freq: Two times a day (BID) | ORAL | Status: DC
  Administered 2012-04-14: 500 mg via ORAL
  Filled 2012-04-13: qty 1

## 2012-04-13 MED ORDER — ONDANSETRON HCL 4 MG PO TABS: 4.0000 mg | ORAL_TABLET | Freq: Three times a day (TID) | ORAL | Status: DC | PRN

## 2012-04-13 MED ORDER — ACETAMINOPHEN 325 MG PO TABS: 650.0000 mg | ORAL_TABLET | ORAL | Status: DC | PRN

## 2012-04-13 NOTE — ED Notes (Signed)
Patient is resting comfortably. Pt remains asleep post injection on day shift. No s/s of distress noted. Respirations regular and unlabored.

## 2012-04-13 NOTE — ED Provider Notes (Signed)
History     CSN: [id number]  Arrival date & time 04/13/12  1551   First MD Initiated Contact with Patient 04/13/12 1556      Chief Complaint  Patient presents with  . Medical Clearance     History limited by: Level V caveat: Psychotic.   The patient was found walking in the middle of the street walking in front of school buses weaning her purse or adequately.  She was yelling and cursing and yelling that no one will take her to go see her deceased father.  She was recently hospitalized for bipolar disorder with psychotic features.  At that time she was found to be in the cars without any close on was brought to the emergency department.  She admits to marijuana use but denies drug use.  She does report that she drink alcohol this morning.   Past Medical History  Diagnosis Date  . Tumor of ovary   . Depression   . Headache   . Boils   . Bipolar 1 disorder, depressed stated by patient  . Polycystic ovarian syndrome     Past Surgical History  Procedure Laterality Date  . Tumor removal      No family history on file.  History  Substance Use Topics  . Smoking status: Current Every Day Smoker -- 0.25 packs/day    Types: Cigarettes  . Smokeless tobacco: Not on file  . Alcohol Use: Yes     Comment: social    OB History   Grav Para Term Preterm Abortions TAB SAB Ect Mult Living   0               Review of Systems  Unable to perform ROS   Allergies  Ibuprofen  Home Medications   Current Outpatient Rx  Name  Route  Sig  Dispense  Refill  . escitalopram (LEXAPRO) 10 MG tablet   Oral   Take 1 tablet (10 mg total) by mouth daily. For depression   30 tablet   0   . QUEtiapine (SEROQUEL XR) 200 MG 24 hr tablet   Oral   Take 1 tablet (200 mg total) by mouth daily after supper. For mood control   30 tablet   0   . traZODone (DESYREL) 50 MG tablet   Oral   Take 1 tablet (50 mg total) by mouth at bedtime and may repeat dose one time if needed. For sleep   60  tablet   0     BP 131/89  Pulse 106  Temp(Src) 0 F (-17.8 C)  SpO2 97%  LMP 02/18/2012  Physical Exam  Nursing note and vitals reviewed. Constitutional: She appears well-developed and well-nourished. No distress.  Yelling  HENT:  Head: Normocephalic and atraumatic.  Eyes: EOM are normal.  Neck: Normal range of motion.  Cardiovascular: Normal rate, regular rhythm and normal heart sounds.   Pulmonary/Chest: Effort normal and breath sounds normal.  Abdominal: Soft. She exhibits no distension. There is no tenderness.  Musculoskeletal: Normal range of motion.  Neurological: She is alert.  Skin: Skin is warm and dry.    ED Course  Procedures (including critical care time)  Labs Reviewed  BASIC METABOLIC PANEL - Abnormal; Notable for the following:    Glucose, Bld 115 (*)    GFR calc non Af Amer 80 (*)    All other components within normal limits  URINE RAPID DRUG SCREEN (HOSP PERFORMED) - Abnormal; Notable for the following:    Tetrahydrocannabinol POSITIVE (*)  All other components within normal limits  VALPROIC ACID LEVEL - Abnormal; Notable for the following:    Valproic Acid Lvl <10.0 (*)    All other components within normal limits  CBC  ETHANOL  PREGNANCY, URINE   I personally reviewed the imaging tests through PACS system I reviewed available ER/hospitalization records through the EMR   No results found.   1. Bipolar disorder       MDM  Patient required Geodon arrival secondary to severe agitation.  After Geodon she seems to be much better.  He is obviously delusional and appears somewhat manic.  Behavior health assessment in to evaluate at this time for placement.  Psych holding orders ordered.        Lyanne Co, MD 04/13/12 [phone]

## 2012-04-13 NOTE — ED Notes (Signed)
Pt brought in [employer], pt screaming and cursing, pt manic; pt placed in rm 12, pt in handcuffs, pt given geodon 20mg  per EDP Campos.

## 2012-04-14 DIAGNOSIS — F1210 Cannabis abuse, uncomplicated: Secondary | ICD-10-CM

## 2012-04-14 DIAGNOSIS — F319 Bipolar disorder, unspecified: Secondary | ICD-10-CM

## 2012-04-14 MED ORDER — ESCITALOPRAM OXALATE 10 MG PO TABS: 10.0000 mg | ORAL_TABLET | Freq: Every day | ORAL | Status: DC

## 2012-04-14 MED ORDER — ZIPRASIDONE MESYLATE 20 MG IM SOLR
20.0000 mg | Freq: Once | INTRAMUSCULAR | Status: AC | PRN
  Administered 2012-04-14: 20 mg via INTRAMUSCULAR
  Filled 2012-04-14: qty 20
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
## 2012-04-14 NOTE — Consult Note (Signed)
Reason for Consult: Bipolar disorder and cannabis abuse Referring Physician: Dr. [name] is an 27 y.o. female.  HPI: Patient was known to this provider from her previous [name] [name] long emergency visits. Patient has been diagnosed with bipolar disorder in the sitting outpatient psychiatry services from the [facility] behavior health patient reported that she does not take her Depakote because of hair loss. She has been compliant with the rest of the medications. Patient has denied symptoms of depression pneumonia and safety and psychosis. Patient has no reported suicidal or homicidal ideation intents or plans.  MSE: Patient was sitting in her bed and eating her lunch, calm and quite cooperative. Patient to stay has no abnormal psychomotor activity has stated mood is good affect was appropriate right and full. She has normal rate and rhythm with increased volume of speech. Patient has normal thought process, has denied suicidal or homicidal ideation intention or plans. and no evidence of psychosis.  Past Medical History  Diagnosis Date  . Tumor of ovary   . Depression   . Headache   . Boils   . Bipolar 1 disorder, depressed stated by patient  . Polycystic ovarian syndrome     Past Surgical History  Procedure Laterality Date  . Tumor removal      No family history on file.  Social History:  reports that she has been smoking Cigarettes.  She has been smoking about 0.25 packs per day. She does not have any smokeless tobacco history on file. She reports that  drinks alcohol. She reports that she uses illicit drugs (Marijuana and Cocaine).  Allergies:  Allergies  Allergen Reactions  . Ibuprofen Other (See Comments)    Stomach upset    Medications: I have reviewed the patient's current medications.  Results for orders placed during the hospital encounter of 04/13/12 (from the past 48 hour(s))  CBC     Status: None   Collection Time    04/13/12  4:30 PM      Result Value Range    WBC 9.3  4.0 - 10.5 K/uL   RBC 4.05  3.87 - 5.11 MIL/uL   Hemoglobin 12.8  12.0 - 15.0 g/dL   HCT 29.5  62.1 - 30.8 %   MCV 93.8  78.0 - 100.0 fL   MCH 31.6  26.0 - 34.0 pg   MCHC 33.7  30.0 - 36.0 g/dL   RDW 65.7  84.6 - 96.2 %   Platelets 263  150 - 400 K/uL  BASIC METABOLIC PANEL     Status: Abnormal   Collection Time    04/13/12  4:30 PM      Result Value Range   Sodium 140  135 - 145 mEq/L   Potassium 3.6  3.5 - 5.1 mEq/L   Chloride 107  96 - 112 mEq/L   CO2 22  19 - 32 mEq/L   Glucose, Bld 115 (*) 70 - 99 mg/dL   BUN 11  6 - 23 mg/dL   Creatinine, Ser 9.52  0.50 - 1.10 mg/dL   Calcium 8.9  8.4 - 84.1 mg/dL   GFR calc non Af Amer 80 (*) >90 mL/min   GFR calc Af Amer >90  >90 mL/min   Comment:            The eGFR has been calculated     using the CKD EPI equation.     This calculation has not been     validated in all clinical  situations.     eGFR's persistently     <90 mL/min signify     possible Chronic Kidney Disease.  ETHANOL     Status: None   Collection Time    04/13/12  4:30 PM      Result Value Range   Alcohol, Ethyl (B) <11  0 - 11 mg/dL   Comment:            LOWEST DETECTABLE LIMIT FOR     SERUM ALCOHOL IS 11 mg/dL     FOR MEDICAL PURPOSES ONLY  VALPROIC ACID LEVEL     Status: Abnormal   Collection Time    04/13/12  4:30 PM      Result Value Range   Valproic Acid Lvl <10.0 (*) 50.0 - 100.0 ug/mL   Comment: REPEATED TO VERIFY  URINE RAPID DRUG SCREEN (HOSP PERFORMED)     Status: Abnormal   Collection Time    04/13/12  5:27 PM      Result Value Range   Opiates NONE DETECTED  NONE DETECTED   Cocaine NONE DETECTED  NONE DETECTED   Benzodiazepines NONE DETECTED  NONE DETECTED   Amphetamines NONE DETECTED  NONE DETECTED   Tetrahydrocannabinol POSITIVE (*) NONE DETECTED   Barbiturates NONE DETECTED  NONE DETECTED   Comment:            DRUG SCREEN FOR MEDICAL PURPOSES     ONLY.  IF CONFIRMATION IS NEEDED     FOR ANY PURPOSE, NOTIFY LAB      WITHIN 5 DAYS.                LOWEST DETECTABLE LIMITS     FOR URINE DRUG SCREEN     Drug Class       Cutoff (ng/mL)     Amphetamine      1000     Barbiturate      200     Benzodiazepine   200     Tricyclics       300     Opiates          300     Cocaine          300     THC              50  PREGNANCY, URINE     Status: None   Collection Time    04/13/12  5:27 PM      Result Value Range   Preg Test, Ur NEGATIVE  NEGATIVE   Comment:            THE SENSITIVITY OF THIS     METHODOLOGY IS >20 mIU/mL.    No results found.  Positive for bipolar, illegal drug usage and mood swings Blood pressure 110/68, pulse 61, temperature 97.7 F (36.5 C), temperature source Oral, resp. rate 16, last menstrual period 02/18/2012, SpO2 99.00%.   Assessment/Plan: Bipolar disorder most recent episode unspecified Cannabis abuse  Patient does not meet criteria for acute psychiatric hospitalization. Patient will be referred to the outpatient psychiatry services at [facility] behavior health. Patient will be receiving her home medications Seroquel, trazodone and Lexapro.  [name] [name],[name] R. 04/14/2012, 1:51 PM

## 2012-04-14 NOTE — ED Notes (Signed)
Pt awake, up to bathroom with unsteady gait. RN assisted pt back to her bed for safety. Pt given water and crackers as requested. Pt states "I don't need any help walking! What's your name? I need to know". Pt defensive and guarded with staff. RN able to redirect patient.

## 2012-04-14 NOTE — [facility] Suicide Risk Assessment (Signed)
Suicide Risk Assessment  Discharge Assessment     Demographic Factors:  Adolescent or young adult, Low socioeconomic status and Living alone  Mental Status Per Nursing Assessment::   On Admission:     Current Mental Status by Physician: Denied symptoms of depression, anxiety, and psychosis. She has no suicidal ideation intention or plans.  Loss Factors: Financial problems/change in socioeconomic status  Historical Factors: Impulsivity  Risk Reduction Factors:   Sense of responsibility to family, Religious beliefs about death and Positive social support  Continued Clinical Symptoms:  Bipolar Disorder:   Mixed State  Cognitive Features That Contribute To Risk:  Polarized thinking    Suicide Risk:  Minimal: No identifiable suicidal ideation.  Patients presenting with no risk factors but with morbid ruminations; may be classified as minimal risk based on the severity of the depressive symptoms  Discharge Diagnoses:   AXIS I:  Bipolar, mixed and Substance Abuse AXIS II:  Deferred AXIS III:   Past Medical History  Diagnosis Date  . Tumor of ovary   . Depression   . Headache   . Boils   . Bipolar 1 disorder, depressed stated by patient  . Polycystic ovarian syndrome    AXIS IV:  economic problems, educational problems, occupational problems, other psychosocial or environmental problems and problems related to social environment AXIS V:  41-50 serious symptoms  Plan Of Care/Follow-up recommendations:  Activity:  As tolerated Diet:  Regular  Is patient on multiple antipsychotic therapies at discharge:  No   Has Patient had three or more failed trials of antipsychotic monotherapy by history:  No  Recommended Plan for Multiple Antipsychotic Therapies: Nonsignificant  Marin Milley,JANARDHAHA R. 04/14/2012, 1:58 PM

## 2012-04-14 NOTE — ED Notes (Signed)
Pt calmer, requests more drink and snacks which were provided. Pt also given a comb.

## 2012-04-14 NOTE — ED Notes (Signed)
Pt approaching nursing station multiple times demanding to make three phone calls. Pt unable to be redirected by multiple staff and refusing to acknowledge unit rules as posted regarding phone times. Pt states she would like to have a copy of her "Medical bill of rights". This Rn attempted to hand patient a copy of patient bill of rights, which the pt then refused stating "Not those, its a different one". Pt becoming more agitated; Dr. Norlene Campbell notified, new orders placed.
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
## 2012-04-14 NOTE — ED Notes (Signed)
Pt remains asleep; no s/s of distress noted currently. Respirations regular and unlabored.

## 2012-04-20 NOTE — Telephone Encounter (Signed)
x

## 2012-05-22 ENCOUNTER — Encounter (HOSPITAL_COMMUNITY): Payer: Self-pay | Admitting: Emergency Medicine

## 2012-05-22 ENCOUNTER — Emergency Department (HOSPITAL_COMMUNITY)
Admission: EM | Admit: 2012-05-22 | Discharge: 2012-05-24 | Disposition: A | Discharge status: Other Acute Inpatient Hospital | Payer: Self-pay | Attending: Emergency Medicine | Admitting: Emergency Medicine

## 2012-05-22 DIAGNOSIS — F3289 Other specified depressive episodes: Secondary | ICD-10-CM | POA: Insufficient documentation

## 2012-05-22 DIAGNOSIS — R4182 Altered mental status, unspecified: Secondary | ICD-10-CM | POA: Insufficient documentation

## 2012-05-22 DIAGNOSIS — Z79899 Other long term (current) drug therapy: Secondary | ICD-10-CM | POA: Insufficient documentation

## 2012-05-22 DIAGNOSIS — F329 Major depressive disorder, single episode, unspecified: Secondary | ICD-10-CM | POA: Insufficient documentation

## 2012-05-22 DIAGNOSIS — F319 Bipolar disorder, unspecified: Secondary | ICD-10-CM

## 2012-05-22 DIAGNOSIS — F3130 Bipolar disorder, current episode depressed, mild or moderate severity, unspecified: Secondary | ICD-10-CM | POA: Insufficient documentation

## 2012-05-22 DIAGNOSIS — Z3202 Encounter for pregnancy test, result negative: Secondary | ICD-10-CM | POA: Insufficient documentation

## 2012-05-22 DIAGNOSIS — F17200 Nicotine dependence, unspecified, uncomplicated: Secondary | ICD-10-CM | POA: Insufficient documentation

## 2012-05-22 DIAGNOSIS — Z8742 Personal history of other diseases of the female genital tract: Secondary | ICD-10-CM | POA: Insufficient documentation

## 2012-05-22 LAB — CBC WITH DIFFERENTIAL/PLATELET
Basophils Absolute: 0 10*3/uL (ref 0.0–0.1)
Eosinophils Relative: 1 % (ref 0–5)
HCT: 32.7 % — ABNORMAL LOW (ref 36.0–46.0)
Lymphocytes Relative: 21 % (ref 12–46)
Lymphs Abs: 1.7 10*3/uL (ref 0.7–4.0)
MCV: 90.8 fL (ref 78.0–100.0)
Monocytes Absolute: 1 10*3/uL (ref 0.1–1.0)
RDW: 12.3 % (ref 11.5–15.5)
WBC: 8.1 10*3/uL (ref 4.0–10.5)

## 2012-05-22 LAB — RAPID URINE DRUG SCREEN, HOSP PERFORMED
Benzodiazepines: NOT DETECTED
Cocaine: NOT DETECTED
Opiates: NOT DETECTED

## 2012-05-22 LAB — BASIC METABOLIC PANEL
CO2: 22 mEq/L (ref 19–32)
Calcium: 8.9 mg/dL (ref 8.4–10.5)
Creatinine, Ser: 0.9 mg/dL (ref 0.50–1.10)
Glucose, Bld: 100 mg/dL — ABNORMAL HIGH (ref 70–99)

## 2012-05-22 MED ORDER — LORAZEPAM 1 MG PO TABS: 2.0000 mg | ORAL_TABLET | Freq: Once | ORAL | Status: DC

## 2012-05-22 MED ORDER — ZIPRASIDONE MESYLATE 20 MG IM SOLR
INTRAMUSCULAR | Status: AC
  Administered 2012-05-22: 10 mg via INTRAMUSCULAR
  Filled 2012-05-22: qty 20

## 2012-05-22 MED ORDER — ZIPRASIDONE MESYLATE 20 MG IM SOLR
10.0000 mg | Freq: Once | INTRAMUSCULAR | Status: AC
  Administered 2012-05-22: 10 mg via INTRAMUSCULAR

## 2012-05-22 NOTE — ED Notes (Signed)
Pt resting in bed. All restraints removed prior to arrival of unit. No signs or symptoms of distress.

## 2012-05-22 NOTE — ED Notes (Signed)
Unable to collect temperature. Patient refused. HR and BP elevated. Patient spitting at staff, mask placed on patient to protect staff. Patient placed in bilateral handcuffs to bedrails, feet restrained. Patient screaming and cursing, rambling in a rhyming fashion, statements do not make sense. Will re-evaluate when patient is more calm.

## 2012-05-22 NOTE — ED Notes (Signed)
Patient is resting comfortably. 

## 2012-05-22 NOTE — ED Provider Notes (Signed)
History     CSN: [id number]  Arrival date & time 05/22/12  2139   First MD Initiated Contact with Patient 05/22/12 2152      Chief Complaint  Patient presents with  . Medical Clearance    (Consider location/radiation/quality/duration/timing/severity/associated sxs/prior treatment) Patient is a 27 y.o. female presenting with altered mental status. The history is provided by the EMS personnel (pt was found yelling and not making sense.  she was brought in by police.  has a hx of bipolar).  Altered Mental Status This is a recurrent problem. The current episode started 12 to 24 hours ago. The problem occurs constantly. The problem has not changed since onset.Pertinent negatives include no chest pain, no abdominal pain and no headaches. Nothing aggravates the symptoms. Nothing relieves the symptoms.    Past Medical History  Diagnosis Date  . Tumor of ovary   . Depression   . Headache   . Boils   . Bipolar 1 disorder, depressed stated by patient  . Polycystic ovarian syndrome     Past Surgical History  Procedure Laterality Date  . Tumor removal      History reviewed. No pertinent family history.  History  Substance Use Topics  . Smoking status: Current Every Day Smoker -- 0.25 packs/day    Types: Cigarettes  . Smokeless tobacco: Not on file  . Alcohol Use: Yes     Comment: social    OB History   Grav Para Term Preterm Abortions TAB SAB Ect Mult Living   0               Review of Systems  Constitutional: Negative for fatigue.  HENT: Negative for congestion, sinus pressure and ear discharge.   Eyes: Negative for discharge.  Respiratory: Negative for cough.   Cardiovascular: Negative for chest pain.  Gastrointestinal: Negative for abdominal pain and diarrhea.  Genitourinary: Negative for frequency and hematuria.  Musculoskeletal: Negative for back pain.  Skin: Negative for rash.  Neurological: Negative for seizures and headaches.  Psychiatric/Behavioral: Positive  for agitation and altered mental status. Negative for hallucinations.    Allergies  Ibuprofen  Home Medications   Current Outpatient Rx  Name  Route  Sig  Dispense  Refill  . escitalopram (LEXAPRO) 10 MG tablet   Oral   Take 1 tablet (10 mg total) by mouth daily. For depression   30 tablet   0   . escitalopram (LEXAPRO) 10 MG tablet   Oral   Take 1 tablet (10 mg total) by mouth daily.   15 tablet   0   . QUEtiapine (SEROQUEL XR) 200 MG 24 hr tablet   Oral   Take 1 tablet (200 mg total) by mouth daily after supper. For mood control   30 tablet   0   . traZODone (DESYREL) 50 MG tablet   Oral   Take 1 tablet (50 mg total) by mouth at bedtime and may repeat dose one time if needed. For sleep   60 tablet   0     BP 124/67  Pulse 88  Temp(Src) 97.9 F (36.6 C) (Oral)  Resp 20  SpO2 99%  Physical Exam  Constitutional: She is oriented to person, place, and time. She appears well-developed.  HENT:  Head: Normocephalic and atraumatic.  Eyes: Conjunctivae and EOM are normal. No scleral icterus.  Neck: Neck supple. No thyromegaly present.  Cardiovascular: Normal rate and regular rhythm.  Exam reveals no gallop and no friction rub.   No murmur  heard. Pulmonary/Chest: No stridor. She has no wheezes. She has no rales. She exhibits no tenderness.  Abdominal: She exhibits no distension. There is no tenderness. There is no rebound.  Musculoskeletal: Normal range of motion. She exhibits no edema.  Lymphadenopathy:    She has no cervical adenopathy.  Neurological: She is oriented to person, place, and time. Coordination normal.  Skin: No rash noted. No erythema.  Psychiatric:  Pt not answering questions appropriately.  She is having flight of ideas.    ED Course  Procedures (including critical care time)  Labs Reviewed  CBC WITH DIFFERENTIAL - Abnormal; Notable for the following:    RBC 3.60 (*)    Hemoglobin 11.5 (*)    HCT 32.7 (*)    Monocytes Relative 13 (*)     All other components within normal limits  URINE RAPID DRUG SCREEN (HOSP PERFORMED)  BASIC METABOLIC PANEL  ETHANOL   No results found.   No diagnosis found.    MDM  Plan to medically clear and telepsyc        [name], MD 05/23/12 1319

## 2012-05-22 NOTE — ED Notes (Signed)
Per [employer], patient was staying with a friend as she is homeless. Friend reported to [employer] patient has become increasingly violent to self, hollering, and talking in rhyming pattern not making any sense. [employer] advises that friend is taking out IVC paperwork as they are fearful for their own safety as well as the safety of the patient.

## 2012-05-23 MED ORDER — QUETIAPINE FUMARATE ER 200 MG PO TB24
200.0000 mg | ORAL_TABLET | Freq: Every day | ORAL | Status: DC
  Filled 2012-05-23 (×2): qty 1

## 2012-05-23 MED ORDER — POTASSIUM CHLORIDE CRYS ER 20 MEQ PO TBCR
40.0000 meq | EXTENDED_RELEASE_TABLET | Freq: Once | ORAL | Status: DC
  Filled 2012-05-23: qty 2

## 2012-05-23 MED ORDER — ZIPRASIDONE HCL 20 MG PO CAPS
20.0000 mg | ORAL_CAPSULE | Freq: Two times a day (BID) | ORAL | Status: DC
  Administered 2012-05-24: 20 mg via ORAL
  Filled 2012-05-23 (×2): qty 1

## 2012-05-23 MED ORDER — LORAZEPAM 1 MG PO TABS: 1.0000 mg | ORAL_TABLET | Freq: Three times a day (TID) | ORAL | Status: DC | PRN

## 2012-05-23 MED ORDER — ESCITALOPRAM OXALATE 10 MG PO TABS
10.0000 mg | ORAL_TABLET | Freq: Every day | ORAL | Status: DC
  Administered 2012-05-23 – 2012-05-24 (×2): 10 mg via ORAL
  Filled 2012-05-23 (×2): qty 1

## 2012-05-23 MED ORDER — ZIPRASIDONE MESYLATE 20 MG IM SOLR
10.0000 mg | Freq: Once | INTRAMUSCULAR | Status: AC
  Administered 2012-05-23: 10 mg via INTRAMUSCULAR
  Filled 2012-05-23: qty 20

## 2012-05-23 MED ORDER — LORAZEPAM 2 MG/ML IJ SOLN
2.0000 mg | Freq: Once | INTRAMUSCULAR | Status: AC
  Administered 2012-05-23: 2 mg via INTRAMUSCULAR
  Filled 2012-05-23: qty 1

## 2012-05-23 MED ORDER — TRAZODONE HCL 50 MG PO TABS: 50.0000 mg | ORAL_TABLET | Freq: Every evening | ORAL | Status: DC | PRN

## 2012-05-23 MED ORDER — ACETAMINOPHEN 325 MG PO TABS: 650.0000 mg | ORAL_TABLET | ORAL | Status: DC | PRN

## 2012-05-23 NOTE — ED Notes (Signed)
Patient is resting comfortably. 
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
## 2012-05-23 NOTE — ED Provider Notes (Signed)
Pt seen and evaluated this am.  Is walking around the ED, no complaints.  Awaiting placement.  Rolan Bucco, MD 05/23/12 0730

## 2012-05-23 NOTE — ED Notes (Signed)
Refused K [id number] did accept OJ x2. Will check to see if she will drink them.

## 2012-05-23 NOTE — ED Notes (Signed)
Lying quietly on her bed with the door closed.

## 2012-05-23 NOTE — Progress Notes (Signed)
Patient has been accepted at [facility] pending bed availability and results of UPT by Dr. Ferol Luz.

## 2012-05-23 NOTE — ED Notes (Addendum)
Placed in 4 point behavioral restraints and within 5 minutes she had gotten out of them. Stated she used her teeth to get them off. Given Ativan 2mg  IM without resistance. And with a lot of redirection was able to finally get her to stay in her room. Restraints were not used/needed.

## 2012-05-23 NOTE — ED Notes (Signed)
Report called to Psych ED RN, Kelly 

## 2012-05-23 NOTE — ED Notes (Signed)
telepsych paperwork faxed

## 2012-05-24 ENCOUNTER — Inpatient Hospital Stay (HOSPITAL_COMMUNITY)
Admission: EM | Admit: 2012-05-24 | Discharge: 2012-05-29 | DRG: 885 | Disposition: A | Discharge status: Home / Self Care | Payer: Federal, State, Local not specified - Other | Source: Intra-hospital | Attending: Psychiatry | Admitting: Psychiatry

## 2012-05-24 DIAGNOSIS — F319 Bipolar disorder, unspecified: Secondary | ICD-10-CM

## 2012-05-24 DIAGNOSIS — Z79899 Other long term (current) drug therapy: Secondary | ICD-10-CM

## 2012-05-24 DIAGNOSIS — F312 Bipolar disorder, current episode manic severe with psychotic features: Principal | ICD-10-CM

## 2012-05-24 DIAGNOSIS — F1210 Cannabis abuse, uncomplicated: Secondary | ICD-10-CM

## 2012-05-24 HISTORY — DX: Mental disorder, not otherwise specified: F99

## 2012-05-24 MED ORDER — WHITE PETROLATUM GEL
Status: AC
  Administered 2012-05-24: 08:00:00
  Filled 2012-05-24: qty 5

## 2012-05-24 MED ORDER — LORAZEPAM 2 MG/ML IJ SOLN
1.0000 mg | INTRAMUSCULAR | Status: AC
  Administered 2012-05-24: 1 mg via INTRAMUSCULAR
  Filled 2012-05-24: qty 1

## 2012-05-24 NOTE — ED Notes (Signed)
Patient discharge with police.

## 2012-05-24 NOTE — Progress Notes (Signed)
Pt accepted to [facility], ivc paperwork incomplete. This Clinical research associate completed ivc paperwork. Pt to be transferred when new ivc is served.   Catha Gosselin, LCSWA  [phone] .05/24/2012 1029pm

## 2012-05-24 NOTE — ED Notes (Signed)
Patient found 600 mg Ibuprofen under the head of her bed.Patient got very upset. I talked to patient to calm her down. Medication wasted.

## 2012-05-24 NOTE — ED Notes (Signed)
Patient had gotten name and phone number of patient in room 38. Patient information taken from patient due the the fact that patient judgement is impaired.

## 2012-05-24 NOTE — ED Provider Notes (Signed)
Filed Vitals:   05/24/12 0647  BP: 156/80  Pulse: 81  Temp: 97.6 F (36.4 C)  Resp: 20    Pt seen and assessed. No acute complaints. Pending placement.   Raeford Razor, MD 05/24/12 [phone]
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
## 2012-05-24 NOTE — BHH Counselor (Signed)
Pt accepted to [facility], attending Dr. Jannifer Franklin. Assessment Notification, ROI, Admission Checklist and UR forms completed and faxed to [facility]. Pt IVC 1st Exam was not completed within 24 hrs of admission date (05/22/12) to the [facility] ED. Pt's IVC documents are being completed and faxed to the magistrate by Catha Gosselin, LCSWA. Pt will be transported to [facility] once [employer]/Sheriff arrives with the signed Custody and Findings. [facility] Assessment Dept has been notified of this delay. Denice Bors, AADC 05/24/2012 10:30 PM

## 2012-05-24 NOTE — ED Notes (Signed)
Patient informed that she can not be dancing in the hall with other patients. Patient also constantly coming to nursing station after being asked to go back to her room. Patient also asking to take shower. Patient informed that she had a shower earlier and the rules are that shower being at 8:00 am.

## 2012-05-25 ENCOUNTER — Encounter (HOSPITAL_COMMUNITY): Payer: Self-pay | Admitting: *Deleted

## 2012-05-25 ENCOUNTER — Inpatient Hospital Stay (HOSPITAL_COMMUNITY): Admission: AD | Admit: 2012-05-25 | Payer: Self-pay | Source: Intra-hospital | Admitting: Psychiatry

## 2012-05-25 DIAGNOSIS — F312 Bipolar disorder, current episode manic severe with psychotic features: Secondary | ICD-10-CM

## 2012-05-25 DIAGNOSIS — F1210 Cannabis abuse, uncomplicated: Secondary | ICD-10-CM

## 2012-05-25 LAB — MAGNESIUM: Magnesium: 2.2 mg/dL (ref 1.5–2.5)

## 2012-05-25 MED ORDER — ESCITALOPRAM OXALATE 10 MG PO TABS
10.0000 mg | ORAL_TABLET | Freq: Every day | ORAL | Status: DC
  Administered 2012-05-26 – 2012-05-27 (×2): 10 mg via ORAL
  Filled 2012-05-25 (×4): qty 1

## 2012-05-25 MED ORDER — TRAZODONE HCL 50 MG PO TABS: 50.0000 mg | ORAL_TABLET | Freq: Every evening | ORAL | Status: DC | PRN

## 2012-05-25 MED ORDER — HALOPERIDOL 5 MG PO TABS
5.0000 mg | ORAL_TABLET | Freq: Two times a day (BID) | ORAL | Status: DC
  Administered 2012-05-25 – 2012-05-29 (×7): 5 mg via ORAL
  Filled 2012-05-25 (×15): qty 1

## 2012-05-25 MED ORDER — ACETAMINOPHEN 325 MG PO TABS
650.0000 mg | ORAL_TABLET | Freq: Four times a day (QID) | ORAL | Status: DC | PRN
  Administered 2012-05-26 – 2012-05-28 (×3): 650 mg via ORAL

## 2012-05-25 MED ORDER — HYDROXYZINE HCL 25 MG PO TABS: 25.0000 mg | ORAL_TABLET | Freq: Every day | ORAL | Status: DC

## 2012-05-25 MED ORDER — ALUM & MAG HYDROXIDE-SIMETH 200-200-20 MG/5ML PO SUSP: 30.0000 mL | ORAL | Status: DC | PRN

## 2012-05-25 MED ORDER — OLANZAPINE 10 MG IM SOLR
INTRAMUSCULAR | Status: AC
  Administered 2012-05-25: 10 mg via INTRAMUSCULAR
  Filled 2012-05-25: qty 10

## 2012-05-25 MED ORDER — LORAZEPAM 1 MG PO TABS
ORAL_TABLET | ORAL | Status: AC
  Administered 2012-05-25: 18:00:00
  Filled 2012-05-25: qty 1

## 2012-05-25 MED ORDER — OLANZAPINE 10 MG PO TBDP: 10.0000 mg | ORAL_TABLET | Freq: Three times a day (TID) | ORAL | Status: DC | PRN

## 2012-05-25 MED ORDER — HALOPERIDOL LACTATE 5 MG/ML IJ SOLN
5.0000 mg | Freq: Two times a day (BID) | INTRAMUSCULAR | Status: DC
  Filled 2012-05-25 (×6): qty 1

## 2012-05-25 MED ORDER — CARBAMAZEPINE 200 MG PO TABS
200.0000 mg | ORAL_TABLET | Freq: Two times a day (BID) | ORAL | Status: DC
  Administered 2012-05-26 – 2012-05-29 (×7): 200 mg via ORAL
  Filled 2012-05-25 (×12): qty 1

## 2012-05-25 MED ORDER — OLANZAPINE 10 MG IM SOLR
10.0000 mg | Freq: Once | INTRAMUSCULAR | Status: AC
  Filled 2012-05-25: qty 10

## 2012-05-25 MED ORDER — HYDROXYZINE HCL 50 MG PO TABS: 50.0000 mg | ORAL_TABLET | Freq: Every evening | ORAL | Status: DC | PRN

## 2012-05-25 MED ORDER — MAGNESIUM HYDROXIDE 400 MG/5ML PO SUSP: 30.0000 mL | Freq: Every day | ORAL | Status: DC | PRN

## 2012-05-25 MED ORDER — POTASSIUM CHLORIDE CRYS ER 20 MEQ PO TBCR
20.0000 meq | EXTENDED_RELEASE_TABLET | Freq: Two times a day (BID) | ORAL | Status: AC
  Administered 2012-05-25 – 2012-05-26 (×3): 20 meq via ORAL
  Filled 2012-05-25: qty 2
  Filled 2012-05-25 (×4): qty 1

## 2012-05-25 MED ORDER — CALCIUM CARBONATE-VITAMIN D 500-200 MG-UNIT PO TABS
1.0000 | ORAL_TABLET | Freq: Every day | ORAL | Status: DC
  Administered 2012-05-25 – 2012-05-29 (×5): 1 via ORAL
  Filled 2012-05-25 (×7): qty 1

## 2012-05-25 MED ORDER — HYDROXYZINE HCL 50 MG PO TABS
50.0000 mg | ORAL_TABLET | Freq: Four times a day (QID) | ORAL | Status: DC | PRN
  Administered 2012-05-25 – 2012-05-29 (×3): 50 mg via ORAL
  Filled 2012-05-25: qty 12
  Filled 2012-05-25: qty 1

## 2012-05-25 MED ORDER — LORAZEPAM 2 MG/ML IJ SOLN
1.0000 mg | Freq: Two times a day (BID) | INTRAMUSCULAR | Status: DC
  Filled 2012-05-25 (×2): qty 1
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
## 2012-05-25 NOTE — Progress Notes (Signed)
Patient ID: [name] [name], female   DOB: 10-Sep-1985, 27 y.o.   MRN: [id number] Pt. Is 27 yo female IVC by friend who says she was hitting on her self and speaking in rhymes. Pt. Has hx of [facility] admission.  Pt. Reports "I don't know why I'm here maam" Then reports "they had me involuntarily admitted"  "my brother tried to get me committed, then the church people took out papers" Pt. Is hyper verbal, pressured and reports that the police took her down, pt. Is tangential. "they had all my stuff hanging out and stuff" Pt. Reports occasional wine coolers, UDS positive for THC.  Pt. Reports that she lives with her sister. Pt. Denies SHI, denies AVH.  Pt. Offered something food/drink. Staff oriented to unit/hall. Staff will monitor q21min for safety.

## 2012-05-25 NOTE — Progress Notes (Signed)
Patient woke up around 0315.  Patient disorganized and tangential.  Patient paranoid and not making sense.  Patient was asked to go to her room but refuses.  Patient refuses medications at this time.  Will continue to monitor.

## 2012-05-25 NOTE — Progress Notes (Signed)
Psychoeducational Group Note  Date:  05/25/2012 Time:  0930  Group Topic/Focus:  Recovery Goals:   The focus of this group is to identify appropriate goals for recovery and establish a plan to achieve them.  Participation Level: Did Not Attend  Participation Quality:  Not Applicable  Affect:  Not Applicable  Cognitive:  Not Applicable  Insight:  Not Applicable  Engagement in Group: Not Applicable  Additional Comments:  Pt refused to attend group this morning.  Channie Bostick E 05/25/2012, 6:35 PM

## 2012-05-25 NOTE — Progress Notes (Signed)
[id number]  [name] [name]  07/10/1985   Today [name] [name] became agitated and irritated, was hyper-verbal with pressured speech, loud and threatening to staff, refusing to take medication. A second opinion was requested and completed by Dr. Daleen Bo who agreed that a force medication order was indicated.  Patient was offered oral medication which she declined. She was given Zyprexa 10mg  IM to the Right glute w/o problem.  She continued to be loud and aggressive but was able to calm down with discussion with AC.  After discussion with the pharmacist-D, Haldol 5mg  IM or PO is recommended for this patient with a goal of moving toward [facility] prior to discharge.  Orders will be written to reflect this. Rona Ravens. Marguriete Wootan RPAC 4:34 PM 05/25/2012

## 2012-05-25 NOTE — [facility] Suicide Risk Assessment (Signed)
Suicide Risk Assessment  Admission Assessment     Nursing information obtained from:  Patient Demographic factors:  Divorced or widowed;Low socioeconomic status;NA Current Mental Status:  NA Loss Factors:  NA Historical Factors:  NA Risk Reduction Factors:  Religious beliefs about death;Living with another person, especially a relative  CLINICAL FACTORS:   Severe Anxiety and/or Agitation Bipolar Disorder:   Mixed State Alcohol/Substance Abuse/Dependencies Currently Psychotic Previous Psychiatric Diagnoses and Treatments  COGNITIVE FEATURES THAT CONTRIBUTE TO RISK:  Closed-mindedness Polarized thinking    SUICIDE RISK:   Mild:  Suicidal ideation of limited frequency, intensity, duration, and specificity.  There are no identifiable plans, no associated intent, mild dysphoria and related symptoms, good self-control (both objective and subjective assessment), few other risk factors, and identifiable protective factors, including available and accessible social support.  PLAN OF CARE:1. Admit for crisis management and stabilization. 2. Medication management to reduce current symptoms to base line and improve the  patient's overall level of functioning 3. Treat health problems as indicated. 4. Develop treatment plan to decrease risk of relapse upon discharge and the need for readmission. 5. Psycho-social education regarding relapse prevention and self care. 6. Health care follow up as needed for medical problems. 7. Restart home medications where appropriate.   I certify that inpatient services furnished can reasonably be expected to improve the patient's condition.  Thedore Mins, MD 05/25/2012, 11:32 AM

## 2012-05-25 NOTE — Tx Team (Signed)
Initial Interdisciplinary Treatment Plan  PATIENT STRENGTHS: (choose at least two) Active sense of humor Communication skills Financial means General fund of knowledge Religious Affiliation Work skills  PATIENT STRESSORS: Legal issue Substance abuse   PROBLEM LIST: Problem List/Patient Goals Date to be addressed Date deferred Reason deferred Estimated date of resolution  Psychosis 05-24-12                                                      DISCHARGE CRITERIA:  Adequate post-discharge living arrangements Improved stabilization in mood, thinking, and/or behavior Medical problems require only outpatient monitoring Reduction of life-threatening or endangering symptoms to within safe limits Verbal commitment to aftercare and medication compliance  PRELIMINARY DISCHARGE PLAN: Outpatient therapy Participate in family therapy Return to previous work or school arrangements  PATIENT/FAMIILY INVOLVEMENT: This treatment plan has been presented to and reviewed with the patient, [name] [name], and/or family member.  The patient and family have been given the opportunity to ask questions and make suggestions.  Mickeal Needy 05/25/2012, 12:17 AM

## 2012-05-25 NOTE — Progress Notes (Signed)
D: Patient in the dayroom demanding a pair of pants from staff and arguing with a peer at the same time.  Patient dressed inappropriately in the dayroom.  Patient was asked to leave and go to her room and put on another shirt.  Patient did agree but argumentative the entire time.  Patient also loud at this time. A: Staff to monitor Q 15 mins for safety.  Encouragement and support offered.  No scheduled medications administered tonight.   R: Patient remains safe on the unit.  Patient did not attend group tonight.  Patient voice has lowered some but she continues to be argumentative while awake.  No medications administered tonight.  After snack patient stated she was going to go in her room and patient has been resting in bed with her eyes closed.
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
## 2012-05-25 NOTE — Progress Notes (Signed)
Patient ID: [name] [name], female   DOB: 1985/10/27, 27 y.o.   MRN: [id number] Patient presents with rapid and pressured speech.  She is hyperverbal and disorganized in her thought process.  This morning before group, she was dancing in the Day Room and singing.  She attempted to hold group, loudly advising her peers in the room.  She is intrusive, loud and extremely labile.  She refused her am medications, stating that she just took vistaril.  Patient continued to exhibit manic behavior and escalated to a point where a forced medication order was processed.  At 1400, patient received zyprexa 10 mg IM.  Patient tolerated the injection after much protest.  Patient began cursing at staff, particularly the nurse and PA.  She began pacing the hallway cursing loudly.  She told staff to "get the hell out of my room"; "stay the hell away from my room."  She yelled that she had a power of attorney and we were going to be sued.  Patient deescalated after talking with ac and now is resting quietly.  Continue to monitor patient's behavior and redirect as necessary.  Monitor medication management and MD orders.  Safety checks continued every 15 minutes per protocol.  Patient is resting quietly at this time.

## 2012-05-26 DIAGNOSIS — F312 Bipolar disorder, current episode manic severe with psychotic features: Principal | ICD-10-CM

## 2012-05-26 MED ORDER — NICOTINE POLACRILEX 2 MG MT GUM
2.0000 mg | CHEWING_GUM | OROMUCOSAL | Status: DC | PRN
  Administered 2012-05-26 – 2012-05-28 (×2): 2 mg via ORAL
  Filled 2012-05-26: qty 1

## 2012-05-26 MED ORDER — BENZTROPINE MESYLATE 0.5 MG PO TABS
0.5000 mg | ORAL_TABLET | Freq: Two times a day (BID) | ORAL | Status: DC
  Administered 2012-05-26 – 2012-05-29 (×6): 0.5 mg via ORAL
  Filled 2012-05-26 (×12): qty 1

## 2012-05-26 NOTE — Progress Notes (Signed)
[facility] LCSW Aftercare Discharge Planning Group Note  05/26/2012 9:30 AM  Participation Quality: DID NOT ATTEND   Smart, Heather N 05/26/2012, 9:30 AM

## 2012-05-26 NOTE — Progress Notes (Signed)
D: Patient walking down hallway to her room on approach.  Patient moving slow and patient is drowsy at this time.  Patient states she thinks she is getting too much medication.  Patient states she only went to one group today because she has been so sleepy.  Patient states she has learned that she need to learn about her medications before she just starts taking whatever the nurses are giving her.  Patient denies SI/HI and denies AVH.   A: Staff to monitor Q 15 mins for safety.  Encouragement and support offered.  Scheduled medications administered per orders.  Nicorette gum administered prn.  Vistaril administered prn for anxiety. R: Patient remains safe on the unit.  Patient attended group tonight but then she left and came back at the end.  Patient taking administered medications.  Patient anxiety decreased.

## 2012-05-26 NOTE — Progress Notes (Signed)
Adult Psychosocial Assessment Update Interdisciplinary Team  [facility] admissions/discharges:  Admissions Discharges  Date:current Date:  Date:03/14/12 Date:  Date: Date:  Date: Date:  Date: Date:   Changes since the last Psychosocial Assessment (including adherence to outpatient mental health and/or substance abuse treatment, situational issues contributing to decompensation and/or relapse). [name] [name] admits that she was non-compliant with appointments and meds after her last d/c.  She presents as manic, with pressured speech, labile mood, tangential thought and is somatically focused.  She was recently released from jail, and she has had 3 hospitalizations since January.             Discharge Plan 1. Will you be returning to the same living situation after discharge?   Yes: No:      If no, what is your plan?    Unknown at this point.  She is currently unable to carry on meaningful conversation about dispositonal planning       2. Would you like a referral for services when you are discharged? Yes:     If yes, for what services?  No:       Unknown  See above       Summary and Recommendations (to be completed by the evaluator) At this point, [name] [name] is probably homeless.  She can benefit from crises stabilization, medication management, therapeutic milieu, and referral to outpt services, possibly ACT team if she is agreeable.                       Signature:  Ida Rogue, 05/26/2012 2:22 PM

## 2012-05-26 NOTE — Clinical Social Work Note (Signed)
[facility] Group Notes:  (Counselor/Nursing/MHT/Case Management/Adjunct)  01/16/2012 12:00 PM  Type of Therapy:  Group Therapy  Participation Level:  Did Not Attend  Participation Quality: Smart, Ledell Peoples 01/16/2012, 12:00 PM

## 2012-05-26 NOTE — Progress Notes (Signed)
[facility] MD Progress Note  05/26/2012 3:17 PM [name] [name]  MRN:  [id number] Subjective:  "I love you...." patient is seen 1:1. She is asked how she is doing.  Objective: [name] [name] is resting in bed, but wakes briefly to speak with this provider. She is drowsy and sedated from the medication, but reports that the sleep has helped significantly. Diagnosis:  Bipolar disorder recurrent severe, most recent episode manic with psychotic features.  ADL's:  Impaired  Sleep: Poor  Appetite:  Good  Suicidal Ideation:  denies Homicidal Ideation:  denies AEB (as evidenced by):patient's report of decreased symptoms, improved mood and affect, return to baseline level of functioning.  Psychiatric Specialty Exam: Review of Systems  Constitutional: Negative.  Negative for fever, chills, weight loss, malaise/fatigue and diaphoresis.  HENT: Negative for congestion and sore throat.   Eyes: Negative for blurred vision, double vision and photophobia.  Respiratory: Negative for cough, shortness of breath and wheezing.   Cardiovascular: Negative for chest pain, palpitations and PND.  Gastrointestinal: Negative for heartburn, nausea, vomiting, abdominal pain, diarrhea and constipation.  Musculoskeletal: Negative for myalgias, joint pain and falls.  Neurological: Negative for dizziness, tingling, tremors, sensory change, speech change, focal weakness, seizures, loss of consciousness, weakness and headaches.  Endo/Heme/Allergies: Negative for polydipsia. Does not bruise/bleed easily.  Psychiatric/Behavioral: Negative for depression, suicidal ideas, hallucinations, memory loss and substance abuse. The patient is not nervous/anxious and does not have insomnia.     Blood pressure 104/63, pulse 99, temperature 98.2 F (36.8 C), temperature source Oral, resp. rate 18, height 5' 10.5" (1.791 m), weight 82.101 kg (181 lb), last menstrual period 05/20/2012.Body mass index is 25.6 kg/(m^2).  General Appearance: Disheveled   Eye Contact::  Minimal  Speech:  Slow  Volume:  Decreased  Mood:  drowsy  Affect:  Congruent  Thought Process:  Disorganized  Orientation:  NA  Thought Content:  Delusions and Paranoid Ideation  Suicidal Thoughts:  No  Homicidal Thoughts:  No  Memory:  NA  Judgement:  Impaired  Insight:  Lacking  Psychomotor Activity:  Decreased  Concentration:  Poor  Recall:  Poor  Akathisia:  No  Handed:  Right  AIMS (if indicated):     Assets:  Communication Skills Desire for Improvement Housing Physical Health  Sleep:  Number of Hours: 6.5   Current Medications: Current Facility-Administered Medications  Medication Dose Route Frequency Provider Last Rate Last Dose  . acetaminophen (TYLENOL) tablet 650 mg  650 mg Oral Q6H PRN Kerry Hough, PA-C   650 mg at 05/26/12 6295  . alum & mag hydroxide-simeth (MAALOX/MYLANTA) 200-200-20 MG/5ML suspension 30 mL  30 mL Oral Q4H PRN Kerry Hough, PA-C      . calcium-vitamin D (OSCAL WITH D) 500-200 MG-UNIT per tablet 1 tablet  1 tablet Oral Daily Kerry Hough, PA-C   1 tablet at 05/26/12 2841  . carbamazepine (TEGRETOL) tablet 200 mg  200 mg Oral BID PC Mojeed Akintayo   200 mg at 05/26/12 0808  . escitalopram (LEXAPRO) tablet 10 mg  10 mg Oral Daily Kerry Hough, PA-C   10 mg at 05/26/12 3244  . haloperidol (HALDOL) tablet 5 mg  5 mg Oral BID Verne Spurr, PA-C   5 mg at 05/26/12 0102   Or  . haloperidol lactate (HALDOL) injection 5 mg  5 mg Intramuscular BID Verne Spurr, PA-C       Or  . LORazepam (ATIVAN) injection 1 mg  1 mg Intramuscular BID Verne Spurr, PA-C      .  hydrOXYzine (ATARAX/VISTARIL) tablet 50 mg  50 mg Oral Q6H PRN Kerry Hough, PA-C   50 mg at 05/25/12 0559  . magnesium hydroxide (MILK OF MAGNESIA) suspension 30 mL  30 mL Oral Daily PRN Kerry Hough, PA-C      . potassium chloride SA (K-DUR,KLOR-CON) CR tablet 20 mEq  20 mEq Oral BID Kerry Hough, PA-C   20 mEq at 05/26/12 1610  . traZODone (DESYREL)  tablet 50 mg  50 mg Oral QHS PRN Kerry Hough, PA-C        Lab Results:  Results for orders placed during the hospital encounter of 05/24/12 (from the past 48 hour(s))  MAGNESIUM     Status: None   Collection Time    05/25/12  6:30 AM      Result Value Range   Magnesium 2.2  1.5 - 2.5 mg/dL    Physical Findings: AIMS: Facial and Oral Movements Muscles of Facial Expression: None, normal Lips and Perioral Area: None, normal Jaw: None, normal Tongue: None, normal,Extremity Movements Upper (arms, wrists, hands, fingers): None, normal Lower (legs, knees, ankles, toes): None, normal, Trunk Movements Neck, shoulders, hips: None, normal, Overall Severity Severity of abnormal movements (highest score from questions above): None, normal Incapacitation due to abnormal movements: None, normal Patient's awareness of abnormal movements (rate only patient's report): No Awareness, Dental Status Current problems with teeth and/or dentures?: No Does patient usually wear dentures?: No  CIWA:  CIWA-Ar Total: 2 COWS:     Treatment Plan Summary: Daily contact with patient to assess and evaluate symptoms and progress in treatment Medication management  Plan:1. Continue crisis management and stabilization. 2. Medication management to reduce current symptoms to base line and improve patient's overall level of functioning 3. Treat health problems as indicated. 4. Develop treatment plan to decrease risk of relapse upon discharge and the need for     readmission. 5. Psycho-social education regarding relapse prevention and self care. 6. Health care follow up as needed for medical problems. 7. Continue home medications where appropriate. 8. Will use Haldol 5mg  po BID with a goal to use Decoanate prior to d/c for increased medication compliance. 9. Will add cogentin for prevention of EPS.  Medical Decision Making Problem Points:  Established problem, stable/improving (1) and Review of last therapy  session (1) Data Points:  Order Aims Assessment (2) Review of medication regiment & side effects (2)  I certify that inpatient services furnished can reasonably be expected to improve the patient's condition.  Rona Ravens. Sahir Tolson RPAC 3:29 PM 05/26/2012

## 2012-05-26 NOTE — Progress Notes (Signed)
The focus of this group is to help patients review their daily goal of treatment and discuss progress on daily workbooks. Pt attended the evening group and responded to all discussion prompts, but Pt was unable to remain on topic for very long before winding her answers through a variety of subjects. Pt reported "yall got me on too much medications because I'm sleepy all the time and can barely hold my head up." Pt began making angry comments near the end of group, seemingly directed at no one in particular, and had to be redirected several times. Eugenie Birks, bitch. I saw the thoughts in your head. Now say them. Say them to me.") Pt appeared drowsy in group and did not appear to be engaged.

## 2012-05-26 NOTE — Progress Notes (Signed)
D: Pt is noncompliant with treatment. Pt is labile this morning. Pt irritated with having to take meds.Pt stated that she do not need to take meds and that she only take them when she feels sick.  Pt argumentive with writer this morning during med pass. Pt redirected by staff for inappropriate language. Pt has poor hygiene. Pt in room sleeping most of the morning.  A: Medications administered as ordered per MD. Verbal support given. Pt encouraged to attend groups. 15 minute checks performed for safety. Pt has force med order. Shift assessment completed.  R: Pt is non-receptive to treatment. No insight

## 2012-05-26 NOTE — Progress Notes (Signed)
Pt labile. Verbally aggressive during evening med pass. Pt irritable. Loud pressured speech. Pt argumentive towards other pt's on the unit. Pt is intrusive and demanding at times.

## 2012-05-26 NOTE — Progress Notes (Signed)
Adult Psychoeducational Group Note  Date:  05/26/2012 Time:  11:35 AM  Group Topic/Focus:  Personal Choices and Values:   The focus of this group is to help patients assess and explore the importance of values in their lives, how their values affect their decisions, how they express their values and what opposes their expression.  Participation Level:  Did Not Attend  Additional Comments:  Pt. Didn't attend group.   Bing Plume D 05/26/2012, 11:35 AM

## 2012-05-26 NOTE — Progress Notes (Signed)
Recreation Therapy Notes  Date: 03.26.2014 Time: 9:30am Location: [address] Day Room      Group Topic/Focus: Goal Setting & Healthy Boundaries  Participation Level: Did not attend  Tayron Hunnell L Donyae Kohn, LRT/CTRS  Oretta Berkland L 05/26/2012 11:36 AM 

## 2012-05-27 MED ORDER — NAPROXEN 500 MG PO TABS
250.0000 mg | ORAL_TABLET | Freq: Two times a day (BID) | ORAL | Status: DC
  Administered 2012-05-27 – 2012-05-29 (×2): 250 mg via ORAL
  Filled 2012-05-27 (×9): qty 1

## 2012-05-27 MED ORDER — TRAZODONE HCL 100 MG PO TABS
100.0000 mg | ORAL_TABLET | Freq: Every evening | ORAL | Status: DC | PRN
  Administered 2012-05-27 – 2012-05-28 (×2): 100 mg via ORAL
  Filled 2012-05-27: qty 1
  Filled 2012-05-27: qty 7
  Filled 2012-05-27: qty 1

## 2012-05-27 MED ORDER — NAPROXEN SODIUM 275 MG PO TABS: 275.0000 mg | ORAL_TABLET | Freq: Two times a day (BID) | ORAL | Status: DC

## 2012-05-27 MED ORDER — NAPROXEN SODIUM 275 MG PO TABS
275.0000 mg | ORAL_TABLET | Freq: Two times a day (BID) | ORAL | Status: DC
  Filled 2012-05-27: qty 1

## 2012-05-27 MED ORDER — HALOPERIDOL LACTATE 5 MG/ML IJ SOLN: 5.0000 mg | Freq: Four times a day (QID) | INTRAMUSCULAR | Status: DC | PRN

## 2012-05-27 MED ORDER — DOCUSATE SODIUM 100 MG PO CAPS
100.0000 mg | ORAL_CAPSULE | Freq: Every day | ORAL | Status: DC
  Administered 2012-05-27 – 2012-05-29 (×3): 100 mg via ORAL
  Filled 2012-05-27 (×7): qty 1

## 2012-05-27 NOTE — Progress Notes (Signed)
Patient ID: [name] [name], female   DOB: 1986/02/02, 27 y.o.   MRN: [id number] Patient has been appropriate today; her mood less labile.  Her ativan injection this am was discontinued due to her sedation.  Staff allowed her to miss group so she could sleep; she is lethargic and slow.  She remains with disorganized thought processes, however, her speech is not as rapid or pressured. She denies any SI/HI/AVH. She had visit with her mother at lunch and said it went well.  Her mother plans to visit her at dinner also.  Her mother is visiting from [place].  Her affect was brighter after visitation.  She has been very appreciative of staff today.  No redirection has been needed today.  Continue to monitor medication management and MD orders.  Safety checks continued every 15 minutes per protocol.  Patient allowed to take naps today due to her sedation.  Her behavior has been appropriate on the milieu.

## 2012-05-27 NOTE — Progress Notes (Signed)
Patient ID: [name] [name], female   DOB: 10-17-1985, 27 y.o.   MRN: [id number]  D: Pt denies SI/HI/AVH. Pt is pleasant and cooperative. Pt seems a little manic, but is redirectable, Pt obsessed with glasses and says the ones she have are too small for her face and she don't want to break them. Pt went to [employer]. Pt complains of ankle pain, but denied medication until she went to sleep. Pt was a  Little emotional when talking to Clinical research associate. Pt states she will try to get help for anger when she gets out.    A: Pt was offered support and encouragement. Pt was given scheduled medications. Pt was encourage to attend groups. Q 15 minute checks were done for safety.    R:Pt attends groups and interacts  with peers and staff. Pt is taking medication. Pt receptive to treatment and safety maintained on unit.

## 2012-05-27 NOTE — Progress Notes (Signed)
[facility] LCSW Aftercare Discharge Planning Group Note  05/27/2012 10:02 AM  Participation Quality:  Intrusive and Monopolizing  Affect:  Excited  Cognitive:  Lacking  Insight:  Lacking  Engagement in Group:  Monopolizing    Modes of Intervention:  Clarification, Discussion, Problem-solving, Socialization and Support  Summary of Progress/Problems: [name] [name] stated that she is confused as to why she is at [facility]. She presented with pressured and disorganized speech. She stated that she had been tazed by the police at her niece's birthday party but could not identify why this occurred. No insight. Pt states that she stopped taking medications after refill ran out. She goes to [facility] for med management and is not interested in additional services such as therapy. She is heavily focused on discharging as soon as possible. Pt rates depression as 0, anxiety as 5--related to being in hospital, no SI/HI, and reports no AVH. She was concerned about getting shot and was told by nurse that she will continue taking medication orally. [name] [name] reports ankle pain for being tazed as a 5. She plans to live with her sister in [place] upon discharge.   Smart, Heather N 05/27/2012, 10:02 AM

## 2012-05-27 NOTE — Progress Notes (Signed)
Psychoeducational Group Note  Date:  05/27/2012 Time:  0930  Group Topic/Focus:  Self Esteem Action Plan:   The focus of this group is to help patients create a plan to continue to build self-esteem after discharge.  Participation Level: Did Not Attend  Participation Quality:  Not Applicable  Affect:  Not Applicable  Cognitive:  Not Applicable  Insight:  Not Applicable  Engagement in Group: Not Applicable  Additional Comments:  Pt refused to attend group this morning.  Mychaela Lennartz E 05/27/2012, 10:18 AM

## 2012-05-27 NOTE — Progress Notes (Signed)
Patient ID: [name] [name], female   DOB: 1985-09-23, 27 y.o.   MRN: [id number] [facility] MD Progress Note  05/27/2012 4:45 PM [name] [name]  MRN:  [id number] Subjective: "When can I go home, I live with my sister sometimes, and with my brother sometimes." "I'm feeling better today." Objective: Lurae is up and active in the unit milieu. She is alert and pleasant, asking appropriate questions. She does attend group this morning, and wants the staff to know that she lives with her brother and sister from time to time. She tells me her pastor is coming to visit today. Diagnosis:  Bipolar disorder recurrent severe, most recent episode manic with psychotic features.  ADL's:  Impaired  Sleep: Poor  Appetite:  Good  Suicidal Ideation:  denies Homicidal Ideation:  denies AEB (as evidenced by):patient's report of decreased symptoms, improved mood and affect, return to baseline level of functioning.  Psychiatric Specialty Exam: Review of Systems  Constitutional: Negative.  Negative for fever, chills, weight loss, malaise/fatigue and diaphoresis.  HENT: Negative for congestion and sore throat.   Eyes: Negative for blurred vision, double vision and photophobia.  Respiratory: Negative for cough, shortness of breath and wheezing.   Cardiovascular: Negative for chest pain, palpitations and PND.  Gastrointestinal: Negative for heartburn, nausea, vomiting, abdominal pain, diarrhea and constipation.  Musculoskeletal: Negative for myalgias, joint pain and falls.  Neurological: Negative for dizziness, tingling, tremors, sensory change, speech change, focal weakness, seizures, loss of consciousness, weakness and headaches.  Endo/Heme/Allergies: Negative for polydipsia. Does not bruise/bleed easily.  Psychiatric/Behavioral: Negative for depression, suicidal ideas, hallucinations, memory loss and substance abuse. The patient is not nervous/anxious and does not have insomnia.     Blood pressure 114/74, pulse 73,  temperature 98 F (36.7 C), temperature source Oral, resp. rate 18, height 5' 10.5" (1.791 m), weight 82.101 kg (181 lb), last menstrual period 05/20/2012.Body mass index is 25.6 kg/(m^2).  General Appearance: Disheveled  Eye Contact::  fair  Speech:  Slow  Volume:  Decreased  Mood:  depressed  Affect:  Labile and tearful today  Thought Process:  Disorganized  Orientation:  NA  Thought Content:  Delusions and Paranoid Ideation  Suicidal Thoughts:  No  Homicidal Thoughts:  No  Memory:  NA  Judgement:  Impaired  Insight:  Lacking  Psychomotor Activity:  Decreased  Concentration:  Poor  Recall:  Poor  Akathisia:  No  Handed:  Right  AIMS (if indicated):     Assets:  Communication Skills Desire for Improvement Housing Physical Health  Sleep:  Number of Hours: 4.5   Current Medications: Current Facility-Administered Medications  Medication Dose Route Frequency Provider Last Rate Last Dose  . acetaminophen (TYLENOL) tablet 650 mg  650 mg Oral Q6H PRN Kerry Hough, PA-C   650 mg at 05/26/12 7829  . alum & mag hydroxide-simeth (MAALOX/MYLANTA) 200-200-20 MG/5ML suspension 30 mL  30 mL Oral Q4H PRN Kerry Hough, PA-C      . benztropine (COGENTIN) tablet 0.5 mg  0.5 mg Oral BID Verne Spurr, PA-C   0.5 mg at 05/27/12 0845  . calcium-vitamin D (OSCAL WITH D) 500-200 MG-UNIT per tablet 1 tablet  1 tablet Oral Daily Kerry Hough, PA-C   1 tablet at 05/27/12 0844  . carbamazepine (TEGRETOL) tablet 200 mg  200 mg Oral BID PC Mojeed Akintayo   200 mg at 05/27/12 0844  . haloperidol (HALDOL) tablet 5 mg  5 mg Oral BID Verne Spurr, PA-C   5 mg at  05/26/12 1659  . haloperidol lactate (HALDOL) injection 5 mg  5 mg Intramuscular Q6H PRN Mojeed Akintayo      . hydrOXYzine (ATARAX/VISTARIL) tablet 50 mg  50 mg Oral Q6H PRN Kerry Hough, PA-C   50 mg at 05/26/12 2124  . magnesium hydroxide (MILK OF MAGNESIA) suspension 30 mL  30 mL Oral Daily PRN Kerry Hough, PA-C      . nicotine  polacrilex (NICORETTE) gum 2 mg  2 mg Oral PRN Mojeed Akintayo   2 mg at 05/26/12 2145  . traZODone (DESYREL) tablet 100 mg  100 mg Oral QHS PRN Mojeed Akintayo        Lab Results:  No results found for this or any previous visit (from the past 48 hour(s)).  Physical Findings: AIMS: Facial and Oral Movements Muscles of Facial Expression: None, normal Lips and Perioral Area: None, normal Jaw: None, normal Tongue: None, normal,Extremity Movements Upper (arms, wrists, hands, fingers): None, normal Lower (legs, knees, ankles, toes): None, normal, Trunk Movements Neck, shoulders, hips: None, normal, Overall Severity Severity of abnormal movements (highest score from questions above): None, normal Incapacitation due to abnormal movements: None, normal Patient's awareness of abnormal movements (rate only patient's report): No Awareness, Dental Status Current problems with teeth and/or dentures?: No Does patient usually wear dentures?: No  CIWA:  CIWA-Ar Total: 2 COWS:     Treatment Plan Summary: Daily contact with patient to assess and evaluate symptoms and progress in treatment Medication management  Plan: 1. Continue crisis management and stabilization. 2. Medication management to reduce current symptoms to base line and improve patient's overall level of functioning 3. Treat health problems as indicated. 4. Develop treatment plan to decrease risk of relapse upon discharge and the need for readmission. 5. Psycho-social education regarding relapse prevention and self care. 6. Health care follow up as needed for medical problems. 7. Continue home medications where appropriate. 8. Will use Haldol 5mg  po BID with a goal to use Decoanate prior to d/c for increased medication compliance. 9. Will add cogentin for prevention of EPS. 10. Did discuss Haldol decoanate with the patient and will start that in the AM. 11. ELOS: 3 days Medical Decision Making Problem Points:  Established  problem, stable/improving (1) and Review of last therapy session (1) Data Points:  Order Aims Assessment (2) Review of medication regiment & side effects (2)  I certify that inpatient services furnished can reasonably be expected to improve the patient's condition.  Rona Ravens. Dariela Stoker RPAC 4:45 PM 05/27/2012

## 2012-05-27 NOTE — Clinical Social Work Note (Signed)
[facility] Group Notes:  (Counselor/Nursing/MHT/Case Management/Adjunct)  05/27/2012 3:25 PM   Type of Therapy:  Group Therapy  Participation Level:  Active  Participation Quality:  Drowsy  Affect:  Lethargic  Cognitive:  Lacking  Insight:  Limited  Engagement in Group:  Improving  Engagement in Therapy:  Improving  Modes of Intervention:  Clarification, Discussion, Exploration, Problem-solving, Socialization and Support  Summary of Progress/Problems: Topic-Balance: The topic for group was balance in life. Pt participated in the discussion about when their life was in balance and out of balance and how this feels. Pt discussed ways to get back in balance and short term goals they can work on to get where they want to be. [name] [name] described balance by her "four rules--love, hope, peace, and joy." She stated that seeking these things leads to a sense of balance for her. [name] [name] said that freedom and faith help her to achieve balance, but sometimes family members and bad friends throw her off balance. She excused herself before the group activity to return to room--feeling sedated from medication.     Smart, Heather N 01/15/2012, 2:34 PM

## 2012-05-28 MED ORDER — HALOPERIDOL DECANOATE 100 MG/ML IM SOLN
50.0000 mg | Freq: Once | INTRAMUSCULAR | Status: AC
  Administered 2012-05-28: 16:00:00 via INTRAMUSCULAR
  Filled 2012-05-28: qty 0.5
  Filled 2012-05-28: qty 1

## 2012-05-28 NOTE — Clinical Social Work Note (Cosign Needed)
[facility] Group Notes:  (Counselor/Nursing/MHT/Case Management/Adjunct)  01/16/2012 12:00 PM  Type of Therapy:  Group Therapy  Participation Level:  Active  Participation Quality:  Monopolizing and Redirectable  Affect:  Excited  Cognitive:  Oriented  Insight:  Improving  Engagement in Group:  Improving  Engagement in Therapy:  Improving  Modes of Intervention:  Discussion, Education, Problem-solving, Socialization and Support  Summary of Progress/Problems: RELAPSE: The topic for today was feelings about relapse. Aarica still unable to recognize that she suffers from mental illness and instead, focused on her physical ailments. She presents with limited insight but with linear thinking. She participated in the ungame and talked about how mental illness affects her life. Calinda talked about how angry and upset it made her to watch her grandmother suffer from cataracts and invasive surgery at such an old age prior to her death. Namira named several coping strategies for dealing with this including prayer, talking to her "boo thing," and calling her church family for emotional support. Jennifier also discussed a time where she lost her temper--when hogtied, tased, and arrested last week. She stated that her motto is "KIM"--keep it moving. She feels that getting time to herself to sort out feelings and confiding in friends and family close to her are vital for coping with extreme anger/stress.   Ocia Simek N 01/16/2012, 12:00 PM

## 2012-05-28 NOTE — Tx Team (Signed)
Interdisciplinary Treatment Plan Update  Date Reviewed: 05/28/2012  Time Reviewed: 11:28 AM  Progress in Treatment:  Attending groups: Yes Participating in groups: Yes  Taking medication as prescribed: Yes Tolerating medication: No  Family/Significant other contact made: No  Patient understands diagnosis: Insight improving, but still denies presence of mental illness.  Discussing patient identified problems/goals with staff: Yes See initial tx plan  Medical problems stabilized or resolved: Yes  Denies suicidal/homicidal ideation: Yes In group. Patient has not harmed self or others: Yes  For review of initial/current patient goals, please see plan of care.  Estimated Length of Stay: 3-4 days Reason for Continuation of Hospitalization:  Mania  Medication stabilization  New Problems/Goals identified: N/A  Discharge Plan or Barriers: Pt will go to shelter. [facility] ACT team appt Thursday, April 3rd.  Additional Comments: Pt's church family invested in pt's aftercare plan--Ms. Sallyanne Kuster [phone] (first lady at her church) and Ms. Delane Ginger** (church member) [phone] are assisting pt with finding more permanent placement.  Attendees:  Signature: Thedore Mins, MD  05/28/2012 11:31 AM   Signature: Richelle Ito, LCSW  05/28/2012 11:32 AM   Signature: Verne Spurr, PA  05/28/2012 11:32 AM   Signature: Joslyn Devon, RN  05/28/2012 11:32 AM   Signature: Trula Slade, MSW Intern 05/28/2012 11:32 AM   Signature:    Signature:    Signature:    Signature:    Signature:    Signature:    Signature:    Signature:    Scribe for Treatment Team:  Trula Slade, MSW Intern, 05/28/2012 11:32 AM

## 2012-05-28 NOTE — Progress Notes (Signed)
[name] [name] ID: [name] [name], female   DOB: 1985/11/27, 27 y.o.   MRN: [id number] [facility] MD Progress Note  05/28/2012 1:23 PM [name] [name]  MRN:  [id number] Subjective: "Come and see my room, it's all clean and organized."  Objective: [name] [name] is up and active in the unit milieu. She is alert and pleasant, asking appropriate questions. She presents as needing approval today. Continues to be religiously fixated. CM states that she is now homeless as she has burned all of her bridges with her family.  Diagnosis:  Bipolar disorder recurrent severe, most recent episode manic with psychotic features.  ADL's:  improved  Sleep: Poor  Appetite:  Good  Suicidal Ideation:  denies Homicidal Ideation:  denies AEB (as evidenced by):[name] [name]'s report of decreased symptoms, improved mood and affect, return to baseline level of functioning.  Psychiatric Specialty Exam: Review of Systems  Constitutional: Negative.  Negative for fever, chills, weight loss, malaise/fatigue and diaphoresis.  HENT: Negative for congestion and sore throat.   Eyes: Negative for blurred vision, double vision and photophobia.  Respiratory: Negative for cough, shortness of breath and wheezing.   Cardiovascular: Negative for chest pain, palpitations and PND.  Gastrointestinal: Negative for heartburn, nausea, vomiting, abdominal pain, diarrhea and constipation.  Musculoskeletal: Negative for myalgias, joint pain and falls.  Neurological: Negative for dizziness, tingling, tremors, sensory change, speech change, focal weakness, seizures, loss of consciousness, weakness and headaches.  Endo/Heme/Allergies: Negative for polydipsia. Does not bruise/bleed easily.  Psychiatric/Behavioral: Negative for depression, suicidal ideas, hallucinations, memory loss and substance abuse. The [name] [name] is not nervous/anxious and does not have insomnia.     Blood pressure 113/74, pulse 67, temperature 97.7 F (36.5 C), temperature source Oral, resp. rate  16, height 5' 10.5" (1.791 m), weight 82.101 kg (181 lb), last menstrual period 05/20/2012.Body mass index is 25.6 kg/(m^2).  General Appearance: fairly groomed  Patent attorney::  fair  Speech:  normal  Volume:  Decreased  Mood:  pleasant  Affect:  Child like  Thought Process:  Disorganized  Orientation:  NA  Thought Content:  Delusions and Paranoid Ideation  Suicidal Thoughts:  No  Homicidal Thoughts:  No  Memory:  NA  Judgement:  Impaired  Insight:  Lacking  Psychomotor Activity:  Decreased  Concentration:  Poor  Recall:  Poor  Akathisia:  No  Handed:  Right  AIMS (if indicated):     Assets:  Communication Skills Desire for Improvement Housing Physical Health  Sleep:  Number of Hours: 4   Current Medications: Current Facility-Administered Medications  Medication Dose Route Frequency Provider Last Rate Last Dose  . acetaminophen (TYLENOL) tablet 650 mg  650 mg Oral Q6H PRN Kerry Hough, PA-C   650 mg at 05/28/12 7829  . alum & mag hydroxide-simeth (MAALOX/MYLANTA) 200-200-20 MG/5ML suspension 30 mL  30 mL Oral Q4H PRN Kerry Hough, PA-C      . benztropine (COGENTIN) tablet 0.5 mg  0.5 mg Oral BID Verne Spurr, PA-C   0.5 mg at 05/28/12 0747  . calcium-vitamin D (OSCAL WITH D) 500-200 MG-UNIT per tablet 1 tablet  1 tablet Oral Daily Kerry Hough, PA-C   1 tablet at 05/28/12 0747  . carbamazepine (TEGRETOL) tablet 200 mg  200 mg Oral BID PC Mojeed Akintayo   200 mg at 05/28/12 1000  . docusate sodium (COLACE) capsule 100 mg  100 mg Oral Daily Verne Spurr, PA-C   100 mg at 05/28/12 0746  . haloperidol (HALDOL) tablet 5 mg  5 mg Oral  BID Verne Spurr, PA-C   5 mg at 05/28/12 0746  . haloperidol lactate (HALDOL) injection 5 mg  5 mg Intramuscular Q6H PRN Mojeed Akintayo      . hydrOXYzine (ATARAX/VISTARIL) tablet 50 mg  50 mg Oral Q6H PRN Kerry Hough, PA-C   50 mg at 05/26/12 2124  . magnesium hydroxide (MILK OF MAGNESIA) suspension 30 mL  30 mL Oral Daily PRN Kerry Hough, PA-C      . naproxen (NAPROSYN) tablet 250 mg  250 mg Oral BID WC Gwen Her, RPH   250 mg at 05/27/12 2153  . nicotine polacrilex (NICORETTE) gum 2 mg  2 mg Oral PRN Mojeed Akintayo   2 mg at 05/28/12 0755  . traZODone (DESYREL) tablet 100 mg  100 mg Oral QHS PRN Mojeed Akintayo   100 mg at 05/27/12 2154    Lab Results:  No results found for this or any previous visit (from the past 48 hour(s)).  Physical Findings: AIMS: Facial and Oral Movements Muscles of Facial Expression: None, normal Lips and Perioral Area: None, normal Jaw: None, normal Tongue: None, normal,Extremity Movements Upper (arms, wrists, hands, fingers): None, normal Lower (legs, knees, ankles, toes): None, normal, Trunk Movements Neck, shoulders, hips: None, normal, Overall Severity Severity of abnormal movements (highest score from questions above): None, normal Incapacitation due to abnormal movements: None, normal [name] [name]'s awareness of abnormal movements (rate only [name] [name]'s report): No Awareness, Dental Status Current problems with teeth and/or dentures?: No Does [name] [name] usually wear dentures?: No  CIWA:  CIWA-Ar Total: 2 COWS:     Treatment Plan Summary: Daily contact with [name] [name] to assess and evaluate symptoms and progress in treatment Medication management  Plan: 1. Continue crisis management and stabilization. 2. Medication management to reduce current symptoms to base line and improve [name] [name]'s overall level of functioning 3. Treat health problems as indicated. 4. Develop treatment plan to decrease risk of relapse upon discharge and the need for readmission. 5. Psycho-social education regarding relapse prevention and self care. 6. Health care follow up as needed for medical problems. 7. Continue home medications where appropriate. 8. Will use Haldol 5mg  po BID with a goal to use Decoanate prior to d/c for increased medication compliance. 9. Will add cogentin for prevention of  EPS. 10. Will start Haldol decanoate at 50mg  IM today. 11. ELOS: 3 days Medical Decision Making Problem Points:  Established problem, stable/improving (1) and Review of last therapy session (1) Data Points:  Order Aims Assessment (2) Review of medication regiment & side effects (2)  I certify that inpatient services furnished can reasonably be expected to improve the [name] [name]'s condition.  Rona Ravens. Marguita Venning RPAC 1:23 PM 05/28/2012

## 2012-05-28 NOTE — Progress Notes (Signed)
Adult Psychoeducational Group Note  Date:  05/28/2012 Time:  2000  Group Topic/Focus:  Karaoke   Participation Level:  Active  Participation Quality:  Appropriate  Affect:  Appropriate  Cognitive:  Appropriate  Insight: Appropriate  Engagement in Group:  Engaged  Modes of Intervention:  Activity  Additional Comments:  Pt attended karaoke this evening. Pt participated in group.    Oaklyn Jakubek A 05/28/2012, 3:11 AM

## 2012-05-28 NOTE — Progress Notes (Signed)
Patient ID: [name] [name], female   DOB: 1985-09-01, 27 y.o.   MRN: [id number]  D: Pt denies SI/HI/AVH. Pt is pleasant and cooperative. Pt continues to be childlike and very hyper-verbal. Pt complained about knee/ankle pain (see MAR). Pt states she talked to family today and plans to stay with a family member, then she states she is going to stay with a church member. Pt continues to be tangential and sometimes flight of ideas.     A: Pt was offered support and encouragement.  Pt was encourage to attend groups. Q 15 minute checks were done for safety. Pt given tylenol at 2040 for knee pain.   R:Pt attends groups and interacts  with peers and staff .Pt receptive to treatment and safety maintained on unit.

## 2012-05-28 NOTE — Progress Notes (Signed)
  D) Patient animated but cooperative upon my assessment. Patient appears bright, childlike behaviors noted at times. Patient completed Patient Self Inventory, reports slept "well," and  appetite is "good." Patient rates depression as   1/10, patient rates hopeless feelings as  1/10. Patient denies SI/HI, denies A/V hallucinations.   A) Patient offered support and encouragement, patient encouraged to discuss feelings/concerns with staff. Patient verbalized understanding. Patient monitored Q15 minutes for safety. Patient met with MD  to discuss today's goals and plan of care.  R) Patient visible in milieu, attending groups in day room and meals in dining room. Patient appropriate with staff and peers.   Patient taking medications as ordered. Patient tearful at times, when peers discharged patient verbalizes "why is everybody leaving, I want them to stay here with me." Will continue to monitor.

## 2012-05-28 NOTE — Progress Notes (Signed)
[facility] INPATIENT:  Family/Significant Other Suicide Prevention Education  Suicide Prevention Education:  Contact Attempts:  has been identified by the patient as the family member/significant other with whom the patient will be residing, and identified as the person(s) who will aid the patient in the event of a mental health crisis.  With written consent from the patient, two attempts were made to provide suicide prevention education, prior to and/or following the patient's discharge.  We were unsuccessful in providing suicide prevention education.  A suicide education pamphlet was given to the patient to share with family/significant other.  Mickaela was not suidical prior to or at time of admission.  No education needed for family member.   Daryel Gerald B 05/28/2012, 2:54 PM

## 2012-05-28 NOTE — Progress Notes (Signed)
[facility] LCSW Aftercare Discharge Planning Group Note  05/28/2012 9:26 AM  Participation Quality:  Intrusive and Redirectable  Affect:  Blunted  Cognitive:  Oriented  Insight:  Limited  Engagement in Group:  Limited  Modes of Intervention:  Discussion, Education and Socialization  Summary of Progress/Problems: [name] [name] is redirectable.  I had to cut her off several times as she wants to go into detail about how she was "tased and hog tied by the police."  Agreeable to a referral to the ACT team.  We will call her sister today with her to see if, in fact, sister is willing to allow her to return.  Daryel Gerald B 05/28/2012, 9:26 AM

## 2012-05-28 NOTE — Progress Notes (Signed)
Recreation Therapy Notes  Date: 03.28.2014  Time: 9:30am  Location: [address] Day Room   Group Topic/Focus: Self Expression   Participation Level:  Active   Participation Quality:  Appropriate   Affect:  Euthymic   Cognitive:  Appropriate   Additional Comments: Patient listened to [employer] "[employer] and Sia "Breath Me" patient was asked to listen to the words of the songs and draw whatever came to her mind. Patient drew balloons with different designs and in different colors. Patient stated they were helium balloons in the sky. Patient actively in group activity. Patient participated in wrap up discussion about using art and music to alleviate stress.   Marykay Lex Tali Cleaves, LRT/CTRS   Jearl Klinefelter 05/28/2012 11:37 AM

## 2012-05-29 DIAGNOSIS — F3130 Bipolar disorder, current episode depressed, mild or moderate severity, unspecified: Secondary | ICD-10-CM

## 2012-05-29 MED ORDER — TRAZODONE HCL 100 MG PO TABS: 100.0000 mg | ORAL_TABLET | Freq: Every evening | ORAL | Status: DC | PRN

## 2012-05-29 MED ORDER — CARBAMAZEPINE 200 MG PO TABS: 200.0000 mg | ORAL_TABLET | Freq: Two times a day (BID) | ORAL | Status: DC

## 2012-05-29 MED ORDER — HALOPERIDOL DECANOATE 100 MG/ML IM SOLN: 100.0000 mg | INTRAMUSCULAR | Status: DC

## 2012-05-29 MED ORDER — ESCITALOPRAM OXALATE 10 MG PO TABS: 10.0000 mg | ORAL_TABLET | Freq: Every day | ORAL | Status: DC

## 2012-05-29 NOTE — Progress Notes (Signed)
Patient ID: [name] [name], female   DOB: 03/16/1985, 27 y.o.   MRN: [id number] Psychoeducational Group Note  Date:  05/29/2012 Time:0930am  Group Topic/Focus:  Identifying Needs:   The focus of this group is to help patients identify their personal needs that have been historically problematic and identify healthy behaviors to address their needs.  Participation Level:  Active  Participation Quality:  Monopolizing  Affect:  Blunted  Cognitive:  Disorganized  Insight:  Monopolizing  Engagement in Group:  Monopolizing  Additional Comments:  Inventory group   Valente David 05/29/2012,9:59 AM

## 2012-05-29 NOTE — Progress Notes (Signed)
[name] Adult Case Management Discharge Plan :  Will you be returning to the same living situation after discharge: No.  Going to shelter At discharge, do you have transportation home?:No.   Given bus pass Do you have the ability to pay for your medications:Yes,  ACT Team will help  Release of information consent forms completed and in the chart;  Patient's signature needed at discharge.  Patient to Follow up at: Follow-up Information   Follow up with [facility] ACT team On 06/03/2012. (12:00  They will pick you up for your appointment if you call to let them know where to come)    Contact information:   [address]  [place]  [phone]      Patient denies SI/HI:   Yes,  denies    Safety Planning and Suicide Prevention discussed:  Yes,  by weekday CSW  [name] 05/29/2012, 11:48 AM

## 2012-05-29 NOTE — Progress Notes (Addendum)
  Patient denies si/hi/avh. Pt verbalizes understanding of discharge instructions, prescriptions and follow up appts. Pt signed for their belongings from locker.  Pt was walked to the lobby and will be  transported by family/friend named jill. Pt was also given a [employer] bus pass.

## 2012-05-29 NOTE — Clinical Social Work Note (Signed)
[facility] Group Notes:  (Clinical Social Work)  05/29/2012  11:15-11:45AM  Summary of Progress/Problems:   The main focus of today's process group was for the patient to identify ways in which they have in the past sabotaged their own recovery and reasons they may have done this/what they received from doing it.  We then worked to identify a specific plan to avoid doing this when discharged from the hospital for this admission.  The patient expressed that she has self-sabotaged by losing focus on herself because she tries to please others, tries to help them more than she helps herself.  She demonstrated some insight and desire to not sabotage herself at this discharge.  Type of Therapy:  Group Therapy - Process  Participation Level:  Active  Participation Quality:  Appropriate, Attentive, Sharing and Supportive  Affect:  Appropriate  Cognitive:  Alert, Appropriate and Oriented  Insight:  Engaged  Engagement in Therapy:  Engaged  Modes of Intervention:  Clarification, Education, Limit-setting, Problem-solving, Socialization, Support and Processing, Exploration, Discussion   Ambrose Mantle, LCSW 05/29/2012, 11:51 AM

## 2012-05-29 NOTE — [facility] Suicide Risk Assessment (Signed)
Suicide Risk Assessment  Discharge Assessment     Demographic Factors:  Low socioeconomic status  Mental Status Per Nursing Assessment::   On Admission:  NA  Current Mental Status by Physician: No si, no hi, no avh, calm, good mood  Loss Factors: Decrease in vocational status  Historical Factors: Impulsivity  Risk Reduction Factors:   Positive social support  Continued Clinical Symptoms:  Bipolar Disorder:   Depressive phase  Cognitive Features That Contribute To Risk:  Loss of executive function    Suicide Risk:  Minimal: No identifiable suicidal ideation.  Patients presenting with no risk factors but with morbid ruminations; may be classified as minimal risk based on the severity of the depressive symptoms  Discharge Diagnoses:   AXIS I:  Bipolar, Depressed AXIS II:  Deferred AXIS III:   Past Medical History  Diagnosis Date  . Tumor of ovary   . Headache   . Boils   . Polycystic ovarian syndrome   . Mental disorder    AXIS IV:  other psychosocial or environmental problems AXIS V:  51-60 moderate symptoms  Plan Of Care/Follow-up recommendations:  Activity:  follow up at [facility]  Is patient on multiple antipsychotic therapies at discharge:  No   Has Patient had three or more failed trials of antipsychotic monotherapy by history:  No  Recommended Plan for Multiple Antipsychotic Therapies: Additional reason(s) for multiple antispychotic treatment:  n/a  Wonda Cerise 05/29/2012, 10:05 AM

## 2012-06-03 NOTE — Progress Notes (Addendum)
Patient Discharge Instructions:  After Visit Summary (AVS):   Faxed to:  06/03/12 Psychiatric Admission Assessment Note:   Faxed to:  06/03/12 Suicide Risk Assessment - Discharge Assessment:   Faxed to:  06/03/12 Faxed/Sent to the Next Level Care provider:  06/03/12 Faxed to [facility] @ [phone]  Jerelene Redden, 06/03/2012, 3:05 PM

## 2012-06-06 ENCOUNTER — Encounter (HOSPITAL_COMMUNITY): Payer: Self-pay | Admitting: Emergency Medicine

## 2012-06-06 ENCOUNTER — Emergency Department (HOSPITAL_COMMUNITY)
Admission: EM | Admit: 2012-06-06 | Discharge: 2012-06-06 | Disposition: A | Discharge status: Home / Self Care | Payer: Self-pay | Attending: Emergency Medicine | Admitting: Emergency Medicine

## 2012-06-06 DIAGNOSIS — R45851 Suicidal ideations: Secondary | ICD-10-CM | POA: Insufficient documentation

## 2012-06-06 DIAGNOSIS — F1910 Other psychoactive substance abuse, uncomplicated: Secondary | ICD-10-CM | POA: Insufficient documentation

## 2012-06-06 LAB — RAPID URINE DRUG SCREEN, HOSP PERFORMED
Barbiturates: NOT DETECTED
Benzodiazepines: NOT DETECTED
Cocaine: POSITIVE — AB
Opiates: NOT DETECTED

## 2012-06-06 LAB — URINALYSIS, ROUTINE W REFLEX MICROSCOPIC
Bilirubin Urine: NEGATIVE
Glucose, UA: NEGATIVE mg/dL
Hgb urine dipstick: NEGATIVE
Specific Gravity, Urine: 1.037 — ABNORMAL HIGH (ref 1.005–1.030)
Urobilinogen, UA: 1 mg/dL (ref 0.0–1.0)

## 2012-06-06 LAB — COMPREHENSIVE METABOLIC PANEL
ALT: 27 U/L (ref 0–35)
AST: 48 U/L — ABNORMAL HIGH (ref 0–37)
Calcium: 9.4 mg/dL (ref 8.4–10.5)
Creatinine, Ser: 0.85 mg/dL (ref 0.50–1.10)
GFR calc Af Amer: >90 mL/min (ref >90)
Glucose, Bld: 78 mg/dL (ref 70–99)
Sodium: 139 mEq/L (ref 135–145)
Total Protein: 7.6 g/dL (ref 6.0–8.3)

## 2012-06-06 LAB — CBC
MCH: 31.6 pg (ref 26.0–34.0)
MCHC: 33.5 g/dL (ref 30.0–36.0)
Platelets: 289 10*3/uL (ref 150–400)

## 2012-06-06 NOTE — ED Notes (Signed)
Pt brought to ED from [facility] for aggressive behavior. Pt is IVC. Pt rambling on arrival, denies drug and etoh use.

## 2012-06-09 ENCOUNTER — Encounter (HOSPITAL_COMMUNITY): Payer: Self-pay | Admitting: Emergency Medicine

## 2012-06-09 ENCOUNTER — Emergency Department (HOSPITAL_COMMUNITY)
Admission: EM | Admit: 2012-06-09 | Discharge: 2012-06-09 | Disposition: A | Discharge status: Home / Self Care | Payer: Self-pay | Attending: Emergency Medicine | Admitting: Emergency Medicine

## 2012-06-09 DIAGNOSIS — A599 Trichomoniasis, unspecified: Secondary | ICD-10-CM

## 2012-06-09 DIAGNOSIS — Z8742 Personal history of other diseases of the female genital tract: Secondary | ICD-10-CM | POA: Insufficient documentation

## 2012-06-09 DIAGNOSIS — Z79899 Other long term (current) drug therapy: Secondary | ICD-10-CM | POA: Insufficient documentation

## 2012-06-09 DIAGNOSIS — L293 Anogenital pruritus, unspecified: Secondary | ICD-10-CM | POA: Insufficient documentation

## 2012-06-09 DIAGNOSIS — Z8639 Personal history of other endocrine, nutritional and metabolic disease: Secondary | ICD-10-CM | POA: Insufficient documentation

## 2012-06-09 DIAGNOSIS — Z8659 Personal history of other mental and behavioral disorders: Secondary | ICD-10-CM | POA: Insufficient documentation

## 2012-06-09 DIAGNOSIS — Z8669 Personal history of other diseases of the nervous system and sense organs: Secondary | ICD-10-CM | POA: Insufficient documentation

## 2012-06-09 DIAGNOSIS — F17200 Nicotine dependence, unspecified, uncomplicated: Secondary | ICD-10-CM | POA: Insufficient documentation

## 2012-06-09 DIAGNOSIS — Z9889 Other specified postprocedural states: Secondary | ICD-10-CM | POA: Insufficient documentation

## 2012-06-09 DIAGNOSIS — A64 Unspecified sexually transmitted disease: Secondary | ICD-10-CM

## 2012-06-09 DIAGNOSIS — N898 Other specified noninflammatory disorders of vagina: Secondary | ICD-10-CM | POA: Insufficient documentation

## 2012-06-09 DIAGNOSIS — A5901 Trichomonal vulvovaginitis: Secondary | ICD-10-CM | POA: Insufficient documentation

## 2012-06-09 DIAGNOSIS — Z872 Personal history of diseases of the skin and subcutaneous tissue: Secondary | ICD-10-CM | POA: Insufficient documentation

## 2012-06-09 DIAGNOSIS — Z862 Personal history of diseases of the blood and blood-forming organs and certain disorders involving the immune mechanism: Secondary | ICD-10-CM | POA: Insufficient documentation

## 2012-06-09 DIAGNOSIS — Z3202 Encounter for pregnancy test, result negative: Secondary | ICD-10-CM | POA: Insufficient documentation

## 2012-06-09 LAB — WET PREP, GENITAL
Clue Cells Wet Prep HPF POC: NONE SEEN
Yeast Wet Prep HPF POC: NONE SEEN

## 2012-06-09 LAB — URINALYSIS, ROUTINE W REFLEX MICROSCOPIC
Glucose, UA: NEGATIVE mg/dL
Hgb urine dipstick: NEGATIVE
Leukocytes, UA: NEGATIVE
Specific Gravity, Urine: 1.027 (ref 1.005–1.030)
pH: 6 (ref 5.0–8.0)

## 2012-06-09 MED ORDER — METRONIDAZOLE 500 MG PO TABS
2000.0000 mg | ORAL_TABLET | Freq: Once | ORAL | Status: AC
  Administered 2012-06-09: 2000 mg via ORAL
  Filled 2012-06-09: qty 4

## 2012-06-09 MED ORDER — METRONIDAZOLE 500 MG PO TABS: 500.0000 mg | ORAL_TABLET | Freq: Two times a day (BID) | ORAL | Status: DC

## 2012-06-09 MED ORDER — METRONIDAZOLE 500 MG PO TABS
500.0000 mg | ORAL_TABLET | Freq: Two times a day (BID) | ORAL | Status: DC
  Filled 2012-06-09: qty 14

## 2012-06-09 NOTE — ED Notes (Signed)
Pt is sent over from [facility] for an std check. She was seen here and medical cleared on the 4/6 and was sent back over to have and std check due to she said she is not being treated for tric and vaginal itching.

## 2012-06-09 NOTE — ED Notes (Signed)
Pt provided sandwich and beverage.

## 2012-06-09 NOTE — ED Provider Notes (Signed)
History     CSN: [id number]  Arrival date & time 06/09/12  4098   First MD Initiated Contact with Patient 06/09/12 1913      Chief Complaint  Patient presents with  . SEXUALLY TRANSMITTED DISEASE   HPI  History provided by the patient. Patient is a 27 year old female who was sent over from [facility] to be evaluated for complaints of possible STD. Patient hadn't complained to staff of vaginal itching and discharge and complaining that she needed treatments for possible STD. Patient states that she does wish to be checked. Denies any abdominal pelvic pain. There is no vaginal bleeding. No dysuria, hematuria or urinary frequency. No fever, chills or sweats. No other aggravating or alleviating factors. No other associated symptoms.    Past Medical History  Diagnosis Date  . Tumor of ovary   . Headache   . Boils   . Polycystic ovarian syndrome   . Mental disorder     Past Surgical History  Procedure Laterality Date  . Tumor removal      No family history on file.  History  Substance Use Topics  . Smoking status: Current Every Day Smoker -- 0.25 packs/day    Types: Cigarettes  . Smokeless tobacco: Not on file  . Alcohol Use: Yes     Comment: social    OB History   Grav Para Term Preterm Abortions TAB SAB Ect Mult Living   0               Review of Systems  Constitutional: Negative for fever, chills and diaphoresis.  Cardiovascular: Negative for chest pain.  Gastrointestinal: Negative for nausea, vomiting, abdominal pain, diarrhea and constipation.  Genitourinary: Positive for vaginal discharge. Negative for dysuria, frequency, hematuria, flank pain, vaginal bleeding, vaginal pain and pelvic pain.  All other systems reviewed and are negative.    Allergies  Ibuprofen  Home Medications   Current Outpatient Rx  Name  Route  Sig  Dispense  Refill  . calcium-vitamin D (OSCAL WITH D) 500-200 MG-UNIT per tablet   Oral   Take 1 tablet by mouth daily.         .  carbamazepine (TEGRETOL) 200 MG tablet   Oral   Take 1 tablet (200 mg total) by mouth 2 (two) times daily after a meal.   60 tablet   0   . haloperidol decanoate (HALDOL DECANOATE) 100 MG/ML injection   Intramuscular   Inject 1 mL (100 mg total) into the muscle every 28 (twenty-eight) days. Due on 06/27/2012   1 mL   0   . hydrOXYzine (ATARAX/VISTARIL) 25 MG tablet   Oral   Take 25 mg by mouth at bedtime.         . traZODone (DESYREL) 100 MG tablet   Oral   Take 1 tablet (100 mg total) by mouth at bedtime as needed for sleep.   30 tablet   0   . Prenatal Vit-Fe Fumarate-FA (PRENATAL MULTIVITAMIN) TABS   Oral   Take 1 tablet by mouth daily.           BP 122/71  Pulse 73  Temp(Src) 97.8 F (36.6 C) (Oral)  Resp 16  SpO2 99%  LMP 05/20/2012  Physical Exam  Nursing note and vitals reviewed. Constitutional: She is oriented to person, place, and time. She appears well-developed and well-nourished. No distress.  HENT:  Head: Normocephalic.  Cardiovascular: Normal rate and regular rhythm.   Pulmonary/Chest: Effort normal and breath sounds normal. No  respiratory distress.  Abdominal: Soft. Bowel sounds are normal. She exhibits no distension. There is no hepatosplenomegaly. There is no tenderness. There is no rebound, no guarding, no CVA tenderness, no tenderness at McBurney's point and negative Murphy's sign.  Lower midline abdominal scar consistent with history of previous surgery  Genitourinary: Cervix exhibits discharge. Cervix exhibits no motion tenderness and no friability. Right adnexum displays no mass and no tenderness. Left adnexum displays no mass and no tenderness.  Chaperone was present.  Small mild white vaginal discharge.  Musculoskeletal: Normal range of motion.  Neurological: She is alert and oriented to person, place, and time.  Skin: Skin is warm and dry. No rash noted.  Psychiatric: She has a normal mood and affect.    ED Course  Procedures    Results for orders placed during the hospital encounter of 06/09/12  WET PREP, GENITAL      Result Value Range   Yeast Wet Prep HPF POC NONE SEEN  NONE SEEN   Trich, Wet Prep MODERATE (*) NONE SEEN   Clue Cells Wet Prep HPF POC NONE SEEN  NONE SEEN   WBC, Wet Prep HPF POC NONE SEEN  NONE SEEN  URINALYSIS, ROUTINE W REFLEX MICROSCOPIC      Result Value Range   Color, Urine YELLOW  YELLOW   APPearance CLEAR  CLEAR   Specific Gravity, Urine 1.027  1.005 - 1.030   pH 6.0  5.0 - 8.0   Glucose, UA NEGATIVE  NEGATIVE mg/dL   Hgb urine dipstick NEGATIVE  NEGATIVE   Bilirubin Urine NEGATIVE  NEGATIVE   Ketones, ur NEGATIVE  NEGATIVE mg/dL   Protein, ur NEGATIVE  NEGATIVE mg/dL   Urobilinogen, UA 1.0  0.0 - 1.0 mg/dL   Nitrite NEGATIVE  NEGATIVE   Leukocytes, UA NEGATIVE  NEGATIVE  POCT PREGNANCY, URINE      Result Value Range   Preg Test, Ur NEGATIVE  NEGATIVE         1. Trichomonas   2. STD (female)       MDM  8:20 PM patient seen and evaluated. Patient well-appearing in no acute distress. Patient does not appear in any significant discomfort.        Angus Seller, PA-C 06/10/12 [phone]

## 2012-06-10 LAB — GC/CHLAMYDIA PROBE AMP
CT Probe RNA: NEGATIVE
GC Probe RNA: NEGATIVE

## 2012-06-10 NOTE — ED Provider Notes (Signed)
Medical screening examination/treatment/procedure(s) were performed by non-physician practitioner and as supervising physician I was immediately available for consultation/collaboration.  Toy Baker, MD 06/10/12 [phone]

## 2012-06-13 NOTE — Discharge Summary (Signed)
Physician Discharge Summary Note  Patient:  [name] [name] is an 27 y.o., female MRN:  [id number] DOB:  1985-09-03 Patient phone:  [phone] (home)  Patient address:   [address] [place] [address],   Date of Admission:  05/24/2012 Date of Discharge: 05/29/2012  Reason for Admission:  Mania  Discharge Diagnoses: Principal Problem:   Severe manic bipolar 1 disorder with psychotic behavior Active Problems:   Cannabis abuse  Review of Systems  Constitutional: Negative.  Negative for fever, chills, weight loss, malaise/fatigue and diaphoresis.  HENT: Negative for congestion and sore throat.   Eyes: Negative for blurred vision, double vision and photophobia.  Respiratory: Negative for cough, shortness of breath and wheezing.   Cardiovascular: Negative for chest pain, palpitations and PND.  Gastrointestinal: Negative for heartburn, nausea, vomiting, abdominal pain, diarrhea and constipation.  Musculoskeletal: Negative for myalgias, joint pain and falls.  Neurological: Negative for dizziness, tingling, tremors, sensory change, speech change, focal weakness, seizures, loss of consciousness, weakness and headaches.  Endo/Heme/Allergies: Negative for polydipsia. Does not bruise/bleed easily.  Psychiatric/Behavioral: Negative for depression, suicidal ideas, hallucinations, memory loss and substance abuse. The patient is not nervous/anxious and does not have insomnia.    Discharge Diagnoses:  AXIS I: Bipolar, Depressed  AXIS II: Deferred  AXIS III:  Past Medical History   Diagnosis  Date   .  Tumor of ovary    .  Headache    .  Boils    .  Polycystic ovarian syndrome    .  Mental disorder     AXIS IV: other psychosocial or environmental problems  AXIS V: 51-60 moderate symptoms Level of Care:  OP  Hospital Course:  [name] [name] was brought to the ED after being petitioned by her roommate due to her bizarre erratic behaviors.  She has a history of bipolar disorder and substance  abuse with non compliance as well.      [name] [name] was evaluated in the ED and found to be floridly manic and unable to provide a clear history. She was given medical clearance and transferred to [facility] for further treatment. Upon arrival at the adult unit [name] [name] was loud, intrusive, with poor boundaries. She had taken all of her clothes off and was walking down the hall nude. She was sexually uninhibited and inappropriate.  Her speech was pressured, disorganized, and circumstantial. She declined medication and a force meds order was written.       She was escalating to the point of a need for a show of force but allowed the IM Haldol and Ativan to be given without difficulty. [name] [name] slept for several hours and was in much improved condition the next morning. She did agree to take medication orally and no further IM medication was needed. Her symptoms resolved rapidly after she gained several hours of sleep. Her pressured speech, sexual preoccupation, and disorganized thinking resolved without side effects.       [name] [name] was continued on Tegratol, Haldol and Trazodone for her symptoms.  She was offered and accepted the use of Haldol Decoanate to help with medication compliance. [name] [name] was given the injection and had no adverse effects.      On the day of discharge [name] [name] reported no SI/HI and denied AVH. She was in full contact with reality and no longer met criteria for continued in patient admission.  Consults:  None  Significant Diagnostic Studies:  labs: CBC-diff, CMP, UA/UDS/ UPT  Discharge Vitals:   Blood pressure 100/70, pulse 73, temperature 96.8 F (  36 C), temperature source Oral, resp. rate 16, height 5' 10.5" (1.791 m), weight 82.101 kg (181 lb), last menstrual period 05/20/2012. Body mass index is 25.6 kg/(m^2). Lab Results:   No results found for this or any previous visit (from the past 72 hour(s)).  Physical Findings: AIMS: Facial and Oral Movements Muscles of Facial Expression: None,  normal Lips and Perioral Area: None, normal Jaw: None, normal Tongue: None, normal,Extremity Movements Upper (arms, wrists, hands, fingers): None, normal Lower (legs, knees, ankles, toes): None, normal, Trunk Movements Neck, shoulders, hips: None, normal, Overall Severity Severity of abnormal movements (highest score from questions above): None, normal Incapacitation due to abnormal movements: None, normal Patient's awareness of abnormal movements (rate only patient's report): No Awareness, Dental Status Current problems with teeth and/or dentures?: No Does patient usually wear dentures?: No  CIWA:  CIWA-Ar Total: 2 COWS:     Psychiatric Specialty Exam: See Psychiatric Specialty Exam and Suicide Risk Assessment completed by Attending Physician prior to discharge.  Discharge destination:  Home  Is patient on multiple antipsychotic therapies at discharge:  No   Has Patient had three or more failed trials of antipsychotic monotherapy by history:  No  Recommended Plan for Multiple Antipsychotic Therapies: not applicable      Medication List    STOP taking these medications       diphenhydrAMINE 25 mg capsule  Commonly known as:  BENADRYL     escitalopram 10 MG tablet  Commonly known as:  LEXAPRO     QUEtiapine 200 MG 24 hr tablet  Commonly known as:  SEROQUEL XR      TAKE these medications     Indication   calcium-vitamin D 500-200 MG-UNIT per tablet  Commonly known as:  OSCAL WITH D  Take 1 tablet by mouth daily.   Nutritional support   carbamazepine 200 MG tablet  Commonly known as:  TEGRETOL  Take 1 tablet (200 mg total) by mouth 2 (two) times daily after a meal.   Indication:  Manic-Depression     haloperidol decanoate 100 MG/ML injection  Commonly known as:  HALDOL DECANOATE  Inject 1 mL (100 mg total) into the muscle every 28 (twenty-eight) days. Due on 06/27/2012   Indication:  Psychosis     hydrOXYzine 25 MG tablet  Commonly known as:  ATARAX/VISTARIL   Take 25 mg by mouth at bedtime.   anxiety   prenatal multivitamin Tabs  Take 1 tablet by mouth daily.   Nutritional supplement   traZODone 100 MG tablet  Commonly known as:  DESYREL  Take 1 tablet (100 mg total) by mouth at bedtime as needed for sleep.   Indication:  Trouble Sleeping           Follow-up Information   Follow up with [facility] ACT team On 06/03/2012. (12:00  They will pick you up for your appointment if you call to let them know where to come)    Contact information:   [address]  [place]  [phone]      Follow-up recommendations:   Activities: Resume activity as tolerated. Diet: Heart healthy low sodium diet Tests: Follow up testing will be determined by your out patient provider.  Comments:    Total Discharge Time:  Greater than 30 minutes.  Signed: Estefana Taylor 06/13/2012, 9:27 PM

## 2012-06-30 NOTE — ED Provider Notes (Signed)
History     CSN: [id number]  Arrival date & time 06/06/12  0027   First MD Initiated Contact with Patient 06/06/12 [phone]      Chief Complaint  Patient presents with  . Medical Clearance    (Consider location/radiation/quality/duration/timing/severity/associated sxs/prior treatment) HPI Pt [facility] for medical clearance. Pt has been IVC'd by brother for running into traffic and making statements about wanting to kill herself. No focal complaints of pain at this time. Multiple prev psychiatric admissions.  Past Medical History  Diagnosis Date  . Tumor of ovary   . Headache   . Boils   . Polycystic ovarian syndrome   . Mental disorder     Past Surgical History  Procedure Laterality Date  . Tumor removal      No family history on file.  History  Substance Use Topics  . Smoking status: Current Every Day Smoker -- 0.25 packs/day    Types: Cigarettes  . Smokeless tobacco: Not on file  . Alcohol Use: Yes     Comment: social    OB History   Grav Para Term Preterm Abortions TAB SAB Ect Mult Living   0               Review of Systems  Constitutional: Negative for fever and chills.  HENT: Negative for neck pain and neck stiffness.   Cardiovascular: Negative for chest pain.  Gastrointestinal: Negative for nausea, vomiting and abdominal pain.  Musculoskeletal: Negative for back pain.  Skin: Negative for wound.  Neurological: Negative for dizziness, weakness, light-headedness, numbness and headaches.  Psychiatric/Behavioral: Positive for suicidal ideas, behavioral problems, self-injury and agitation.  All other systems reviewed and are negative.    Allergies  Ibuprofen  Home Medications   Current Outpatient Rx  Name  Route  Sig  Dispense  Refill  . calcium-vitamin D (OSCAL WITH D) 500-200 MG-UNIT per tablet   Oral   Take 1 tablet by mouth daily.         . carbamazepine (TEGRETOL) 200 MG tablet   Oral   Take 1 tablet (200 mg total) by mouth 2  (two) times daily after a meal.   60 tablet   0   . haloperidol decanoate (HALDOL DECANOATE) 100 MG/ML injection   Intramuscular   Inject 1 mL (100 mg total) into the muscle every 28 (twenty-eight) days. Due on 06/27/2012   1 mL   0   . hydrOXYzine (ATARAX/VISTARIL) 25 MG tablet   Oral   Take 25 mg by mouth at bedtime.         . metroNIDAZOLE (FLAGYL) 500 MG tablet   Oral   Take 1 tablet (500 mg total) by mouth 2 (two) times daily. One po bid x 7 days   14 tablet   0   . Prenatal Vit-Fe Fumarate-FA (PRENATAL MULTIVITAMIN) TABS   Oral   Take 1 tablet by mouth daily.         . traZODone (DESYREL) 100 MG tablet   Oral   Take 1 tablet (100 mg total) by mouth at bedtime as needed for sleep.   30 tablet   0     BP 114/80  Pulse 70  Temp(Src) 97.1 F (36.2 C) (Oral)  Resp 18  SpO2 98%  LMP 05/20/2012  Physical Exam  Nursing note and vitals reviewed. Constitutional: She is oriented to person, place, and time. She appears well-developed and well-nourished. No distress.  HENT:  Head: Normocephalic and atraumatic.  Mouth/Throat: Oropharynx  is clear and moist.  Eyes: EOM are normal. Pupils are equal, round, and reactive to light.  Neck: Normal range of motion. Neck supple.  Cardiovascular: Normal rate and regular rhythm.   Pulmonary/Chest: Effort normal and breath sounds normal. No respiratory distress. She has no wheezes. She has no rales.  Abdominal: Soft. Bowel sounds are normal. She exhibits no distension and no mass. There is no tenderness. There is no rebound and no guarding.  Musculoskeletal: Normal range of motion. She exhibits no edema and no tenderness.  Neurological: She is alert and oriented to person, place, and time.  Skin: Skin is warm and dry. No rash noted. No erythema.  Psychiatric: She has a normal mood and affect. Her behavior is normal.    ED Course  Procedures (including critical care time)  Labs Reviewed  COMPREHENSIVE METABOLIC PANEL -  Abnormal; Notable for the following:    AST 48 (*)    Total Bilirubin 0.2 (*)    All other components within normal limits  URINE RAPID DRUG SCREEN (HOSP PERFORMED) - Abnormal; Notable for the following:    Cocaine POSITIVE (*)    Tetrahydrocannabinol POSITIVE (*)    All other components within normal limits  URINALYSIS, ROUTINE W REFLEX MICROSCOPIC - Abnormal; Notable for the following:    Specific Gravity, Urine 1.037 (*)    Ketones, ur 15 (*)    All other components within normal limits  CBC  ETHANOL  PREGNANCY, URINE   No results found.   1. Polysubstance abuse   2. Suicidal ideation       MDM  Medically cleared to return to [facility].         Loren Racer, MD 06/30/12 [phone]

## 2012-09-17 ENCOUNTER — Telehealth: Payer: Self-pay | Admitting: General Practice

## 2012-09-17 NOTE — Telephone Encounter (Signed)
Patient called and left message stating she saw Dr Shawnie Pons here in the clinic and was given a RX for a STD she had and she got incarcerated right after that and unable to get medicine and she went to the pharmacy the other day and was told she would need to call us for a refill. Called patient back letting her know I received her message. Patient stated she is still having symptoms like the trich and wants the flagyl refilled because she never picked up the medicine in December because she was incarcerated at the mental hospital. From chart review I see trich and BV diagnosis in December, patient was given rx for flagyl but apparently never got the Rx, also noted patient came into [facility] ED 4/9 and was diagnosed with trich and its documented that it was treated at that time- patient states she took some pills in the ER there but thought it was pain medication. Told patient that she then got treated at the ER for the trich and if she still has symptoms then she needs to go back and be retested to make sure it's not another STD or something else. Patient states she doesn't have a way to get to the ER to be evaluated and just needs the Rx so she can get treated because she never has been. Told patient she was treated in the ER in April and patient denies taking any medication there. Told patient that we cannot refill the medication from December because if she is having symptoms now it could be trich but it could also be another STD and if that's the case they need to know which type of STD it is because Flagyl only treats trich not other STDs. Patient verbalized understanding, was agreeable and had no further questions

## 2012-10-05 ENCOUNTER — Inpatient Hospital Stay (HOSPITAL_COMMUNITY)
Admission: AD | Admit: 2012-10-05 | Discharge: 2012-10-05 | Disposition: A | Discharge status: Home / Self Care | Payer: Self-pay | Source: Ambulatory Visit | Attending: Obstetrics & Gynecology | Admitting: Obstetrics & Gynecology

## 2012-10-05 ENCOUNTER — Encounter (HOSPITAL_COMMUNITY): Payer: Self-pay | Admitting: *Deleted

## 2012-10-05 DIAGNOSIS — N949 Unspecified condition associated with female genital organs and menstrual cycle: Secondary | ICD-10-CM | POA: Insufficient documentation

## 2012-10-05 DIAGNOSIS — N898 Other specified noninflammatory disorders of vagina: Secondary | ICD-10-CM

## 2012-10-05 DIAGNOSIS — Z202 Contact with and (suspected) exposure to infections with a predominantly sexual mode of transmission: Secondary | ICD-10-CM

## 2012-10-05 DIAGNOSIS — R109 Unspecified abdominal pain: Secondary | ICD-10-CM | POA: Insufficient documentation

## 2012-10-05 LAB — URINALYSIS, ROUTINE W REFLEX MICROSCOPIC
Glucose, UA: NEGATIVE mg/dL
Hgb urine dipstick: NEGATIVE
Ketones, ur: NEGATIVE mg/dL
Leukocytes, UA: NEGATIVE
Protein, ur: NEGATIVE mg/dL
pH: 6 (ref 5.0–8.0)

## 2012-10-05 LAB — WET PREP, GENITAL: Trich, Wet Prep: NONE SEEN

## 2012-10-05 MED ORDER — METRONIDAZOLE 500 MG PO TABS
2000.0000 mg | ORAL_TABLET | Freq: Once | ORAL | Status: AC
  Administered 2012-10-05: 2000 mg via ORAL
  Filled 2012-10-05: qty 4

## 2012-10-05 NOTE — MAU Provider Note (Signed)
Chief Complaint: Abdominal Pain and Vaginal Discharge   First Provider Initiated Contact with Patient 10/05/12 2040      SUBJECTIVE HPI: [name] [name] is a 27 y.o. G0P0 female who presents with heavy vaginal discharge w/ odor and cramping. Dx Trich 02/2012, 06/2012. Pt Tx, but  partner not Tx. Had unprotected IC w/ him since then.    Past Medical History  Diagnosis Date  . Tumor of ovary   . Headache(784.0)   . Boils   . Polycystic ovarian syndrome   . Mental disorder    OB History   Grav Para Term Preterm Abortions TAB SAB Ect Mult Living   0              Past Surgical History  Procedure Laterality Date  . Tumor removal     History   Social History  . Marital Status: Divorced    Spouse Name: N/A    Number of Children: N/A  . Years of Education: N/A   Occupational History  . Not on file.   Social History Main Topics  . Smoking status: Current Every Day Smoker -- 0.25 packs/day    Types: Cigarettes  . Smokeless tobacco: Not on file  . Alcohol Use: Yes     Comment: SMOKED MARIJUANA- JULY,  COCAINE-  NONE THIS YEAR.  . Drug Use: Yes    Special: Marijuana, Cocaine  . Sexually Active: Yes    Birth Control/ Protection: None   Other Topics Concern  . Not on file   Social History Narrative  . No narrative on file   No current facility-administered medications on file prior to encounter.   Current Outpatient Prescriptions on File Prior to Encounter  Medication Sig Dispense Refill  . Prenatal Vit-Fe Fumarate-FA (PRENATAL MULTIVITAMIN) TABS Take 1 tablet by mouth daily.       Allergies  Allergen Reactions  . Ibuprofen Other (See Comments)    Stomach upset    ROS: Neg  OBJECTIVE Blood pressure 115/72, pulse 78, resp. rate 16, height 5\' 11"  (1.803 m), weight 92.647 kg (204 lb 4 oz), last menstrual period 09/09/2012, SpO2 100.00%. GENERAL: Well-developed, well-nourished female in no acute distress.  HEENT: Normocephalic HEART: normal rate RESP: normal  effort ABDOMEN: Soft, non-tender EXTREMITIES: Nontender, no edema NEURO: Alert and oriented SPECULUM EXAM: NEFG, large amount of thin, white, foul-smelling discharge, no blood noted, cervix clean BIMANUAL: cervix closed; uterus normal size, no adnexal tenderness or masses. No CMT.  LAB RESULTS Results for orders placed during the hospital encounter of 10/05/12 (from the past 24 hour(s))  URINALYSIS, ROUTINE W REFLEX MICROSCOPIC     Status: Abnormal   Collection Time    10/05/12  7:58 PM      Result Value Range   Color, Urine YELLOW  YELLOW   APPearance CLEAR  CLEAR   Specific Gravity, Urine >1.030 (*) 1.005 - 1.030   pH 6.0  5.0 - 8.0   Glucose, UA NEGATIVE  NEGATIVE mg/dL   Hgb urine dipstick NEGATIVE  NEGATIVE   Bilirubin Urine NEGATIVE  NEGATIVE   Ketones, ur NEGATIVE  NEGATIVE mg/dL   Protein, ur NEGATIVE  NEGATIVE mg/dL   Urobilinogen, UA 1.0  0.0 - 1.0 mg/dL   Nitrite NEGATIVE  NEGATIVE   Leukocytes, UA NEGATIVE  NEGATIVE  POCT PREGNANCY, URINE     Status: None   Collection Time    10/05/12  8:13 PM      Result Value Range   Preg Test, Ur NEGATIVE  NEGATIVE  WET PREP, GENITAL     Status: Abnormal   Collection Time    10/05/12  8:43 PM      Result Value Range   Yeast Wet Prep HPF POC NONE SEEN  NONE SEEN   Trich, Wet Prep NONE SEEN  NONE SEEN   Clue Cells Wet Prep HPF POC MANY (*) NONE SEEN   WBC, Wet Prep HPF POC FEW (*) NONE SEEN    IMAGING No results found.  MAU COURSE Will Tx for Trich due to know exposure. Flagyl 2000 given.   ASSESSMENT 1. Vaginal discharge   2. Exposure to STD     PLAN Discharge home Partner needs Tx. Recommend F/U in clinic for complete STD testing. Always use condoms.      Follow-up Information   Follow up with [facility]. (As needed)    Contact information:   [address] [place] [address] [phone]       Medication List         prenatal multivitamin Tabs tablet  Take 1 tablet by mouth daily.        [place], [place] 10/05/2012  9:32 PM

## 2012-10-05 NOTE — Progress Notes (Signed)
Patient is not in lobby

## 2012-10-05 NOTE — MAU Note (Signed)
PT SAYS SHE WAS IN MAU  IN DEC-  DX WITH TRICH AND CERV-  WE GAVE HER 4 PILLS AND A RX.    COULD NOT GET FLAGYL   FILLED - HAD  TO GO TO JAIL   JAIL FROM  FEB TO MARCH, THEN  WENT TO [facility]- UNSURE OF WHEN .SHE CALLED IN  July- WANTED TO GET RX-     TOLD NEEDED TO BE SEEN  TO GET ANOTHER RX.    NOW HAS LOWER ABD PAIN- NO BLEEDING.  LAST SEX- Saturday-   NO PROTECTION   SAME PARTNER X2 YEARS  BUT HE HAS HAD ANOTHER PARTNER  SHE HAS VAG D/C-   WHITE-  NO ODOR.   SAYS L TUBE REMOVED IN 2008.

## 2012-10-06 LAB — GC/CHLAMYDIA PROBE AMP
CT Probe RNA: NEGATIVE
GC Probe RNA: NEGATIVE

## 2012-10-17 NOTE — MAU Provider Note (Signed)
Attestation of Attending Supervision of Advanced Practitioner (CNM/NP): Evaluation and management procedures were performed by the Advanced Practitioner under my supervision and collaboration. I have reviewed the Advanced Practitioner's note and chart, and I agree with the management and plan.  Alwaleed Obeso H. 4:27 PM   

## 2012-12-20 ENCOUNTER — Emergency Department (HOSPITAL_COMMUNITY)
Admission: EM | Admit: 2012-12-20 | Discharge: 2012-12-20 | Disposition: A | Discharge status: Home / Self Care | Payer: Self-pay | Attending: Emergency Medicine | Admitting: Emergency Medicine

## 2012-12-20 ENCOUNTER — Encounter (HOSPITAL_COMMUNITY): Payer: Self-pay | Admitting: Emergency Medicine

## 2012-12-20 DIAGNOSIS — N898 Other specified noninflammatory disorders of vagina: Secondary | ICD-10-CM | POA: Insufficient documentation

## 2012-12-20 DIAGNOSIS — Z3202 Encounter for pregnancy test, result negative: Secondary | ICD-10-CM | POA: Insufficient documentation

## 2012-12-20 DIAGNOSIS — R300 Dysuria: Secondary | ICD-10-CM | POA: Insufficient documentation

## 2012-12-20 DIAGNOSIS — F17200 Nicotine dependence, unspecified, uncomplicated: Secondary | ICD-10-CM | POA: Insufficient documentation

## 2012-12-20 LAB — URINE MICROSCOPIC-ADD ON

## 2012-12-20 LAB — URINALYSIS, ROUTINE W REFLEX MICROSCOPIC
Bilirubin Urine: NEGATIVE
Hgb urine dipstick: NEGATIVE
Ketones, ur: NEGATIVE mg/dL
Nitrite: NEGATIVE
pH: 6 (ref 5.0–8.0)

## 2012-12-20 NOTE — ED Notes (Signed)
Pt c/o dysuria and white vaginal discharge

## 2012-12-20 NOTE — ED Notes (Signed)
Patient was called to go back to a room x2 and she did not answer.

## 2012-12-20 NOTE — ED Notes (Signed)
Pt called in main ED waiting area with no repsonse

## 2013-01-02 ENCOUNTER — Emergency Department (HOSPITAL_COMMUNITY)
Admission: EM | Admit: 2013-01-02 | Discharge: 2013-01-02 | Disposition: A | Discharge status: Home / Self Care | Payer: Self-pay | Attending: Emergency Medicine | Admitting: Emergency Medicine

## 2013-01-02 ENCOUNTER — Encounter (HOSPITAL_COMMUNITY): Payer: Self-pay | Admitting: Emergency Medicine

## 2013-01-02 DIAGNOSIS — N898 Other specified noninflammatory disorders of vagina: Secondary | ICD-10-CM | POA: Insufficient documentation

## 2013-01-02 DIAGNOSIS — Z872 Personal history of diseases of the skin and subcutaneous tissue: Secondary | ICD-10-CM | POA: Insufficient documentation

## 2013-01-02 DIAGNOSIS — Z8742 Personal history of other diseases of the female genital tract: Secondary | ICD-10-CM | POA: Insufficient documentation

## 2013-01-02 DIAGNOSIS — F17200 Nicotine dependence, unspecified, uncomplicated: Secondary | ICD-10-CM | POA: Insufficient documentation

## 2013-01-02 DIAGNOSIS — Z8619 Personal history of other infectious and parasitic diseases: Secondary | ICD-10-CM | POA: Insufficient documentation

## 2013-01-02 DIAGNOSIS — N390 Urinary tract infection, site not specified: Secondary | ICD-10-CM | POA: Insufficient documentation

## 2013-01-02 DIAGNOSIS — Z8659 Personal history of other mental and behavioral disorders: Secondary | ICD-10-CM | POA: Insufficient documentation

## 2013-01-02 DIAGNOSIS — Z3202 Encounter for pregnancy test, result negative: Secondary | ICD-10-CM | POA: Insufficient documentation

## 2013-01-02 DIAGNOSIS — Z8639 Personal history of other endocrine, nutritional and metabolic disease: Secondary | ICD-10-CM | POA: Insufficient documentation

## 2013-01-02 DIAGNOSIS — Z862 Personal history of diseases of the blood and blood-forming organs and certain disorders involving the immune mechanism: Secondary | ICD-10-CM | POA: Insufficient documentation

## 2013-01-02 LAB — URINALYSIS, ROUTINE W REFLEX MICROSCOPIC
Bilirubin Urine: NEGATIVE
Glucose, UA: NEGATIVE mg/dL
Hgb urine dipstick: NEGATIVE
Ketones, ur: 15 mg/dL — AB
Nitrite: NEGATIVE
Protein, ur: NEGATIVE mg/dL
Specific Gravity, Urine: 1.026 (ref 1.005–1.030)
Urobilinogen, UA: 1 mg/dL (ref 0.0–1.0)
pH: 6 (ref 5.0–8.0)

## 2013-01-02 LAB — POCT PREGNANCY, URINE: Preg Test, Ur: NEGATIVE

## 2013-01-02 LAB — RAPID HIV SCREEN (WH-MAU): Rapid HIV Screen: NONREACTIVE

## 2013-01-02 LAB — URINE MICROSCOPIC-ADD ON

## 2013-01-02 MED ORDER — AZITHROMYCIN 250 MG PO TABS
1000.0000 mg | ORAL_TABLET | Freq: Once | ORAL | Status: AC
  Administered 2013-01-02: 1000 mg via ORAL
  Filled 2013-01-02: qty 4

## 2013-01-02 MED ORDER — CEPHALEXIN 500 MG PO CAPS: 500.0000 mg | ORAL_CAPSULE | Freq: Four times a day (QID) | ORAL | Status: DC

## 2013-01-02 MED ORDER — CEFTRIAXONE SODIUM 250 MG IJ SOLR
250.0000 mg | Freq: Once | INTRAMUSCULAR | Status: AC
  Administered 2013-01-02: 250 mg via INTRAMUSCULAR
  Filled 2013-01-02: qty 250

## 2013-01-02 MED ORDER — LIDOCAINE HCL (PF) 1 % IJ SOLN
1.0000 mL | Freq: Once | INTRAMUSCULAR | Status: AC
  Administered 2013-01-02: 1 mL
  Filled 2013-01-02: qty 5

## 2013-01-02 NOTE — ED Provider Notes (Signed)
CSN: [id number]     Arrival date & time 01/02/13  4098 History   First MD Initiated Contact with Patient 01/02/13 [phone]     Chief Complaint  Patient presents with  . Abdominal Pain   (Consider location/radiation/quality/duration/timing/severity/associated sxs/prior Treatment) HPI Comments: Patient presents to the ED with a CC of lower abdominal pain x several days.  She states that she has had dysuria for approximately 1 month.  She states that she tried taking a friend's keflex for 3-4 days and noticed that her symptoms improved.  She also complains of vaginal discharge, which has been worsening over the past several weeks as well.  She has been seen here previously and treated for STD.  She denies fevers, chills, nausea, vomiting, diarrhea, or constipation.  She denies any other problems at this time.  The history is provided by the patient. No language interpreter was used.    Past Medical History  Diagnosis Date  . Tumor of ovary   . Headache(784.0)   . Boils   . Polycystic ovarian syndrome   . Mental disorder    Past Surgical History  Procedure Laterality Date  . Tumor removal     History reviewed. No pertinent family history. History  Substance Use Topics  . Smoking status: Current Every Day Smoker -- 0.25 packs/day    Types: Cigarettes  . Smokeless tobacco: Not on file  . Alcohol Use: Yes     Comment: SMOKED MARIJUANA- JULY,  COCAINE-  NONE THIS YEAR.   OB History   Grav Para Term Preterm Abortions TAB SAB Ect Mult Living   0              Review of Systems  All other systems reviewed and are negative.    Allergies  Ibuprofen  Home Medications   Current Outpatient Rx  Name  Route  Sig  Dispense  Refill  . Prenatal Vit-Fe Fumarate-FA (PRENATAL MULTIVITAMIN) TABS   Oral   Take 1 tablet by mouth daily.          BP 109/69  Pulse 74  Temp(Src) 97 F (36.1 C) (Oral)  Resp 16  SpO2 99%  LMP 11/01/2012 Physical Exam  Nursing note and vitals  reviewed. Constitutional: She is oriented to person, place, and time. She appears well-developed and well-nourished.  HENT:  Head: Normocephalic and atraumatic.  Eyes: Conjunctivae and EOM are normal. Pupils are equal, round, and reactive to light.  Neck: Normal range of motion. Neck supple.  Cardiovascular: Normal rate and regular rhythm.  Exam reveals no gallop and no friction rub.   No murmur heard. Pulmonary/Chest: Effort normal and breath sounds normal. No respiratory distress. She has no wheezes. She has no rales. She exhibits no tenderness.  Abdominal: Soft. She exhibits no distension and no mass. There is tenderness. There is no rebound and no guarding. Hernia confirmed negative in the right inguinal area and confirmed negative in the left inguinal area.  Suprapubic tenderness, no other focal abdominal tenderness, no pain at McBurney's point, no Murphy's sign, no signs of peritonitis.  Genitourinary: No labial fusion. There is no rash, tenderness, lesion or injury on the right labia. There is no rash, tenderness, lesion or injury on the left labia. Uterus is not deviated, not enlarged, not fixed and not tender. Cervix exhibits no motion tenderness, no discharge and no friability. Right adnexum displays no mass, no tenderness and no fullness. Left adnexum displays no mass, no tenderness and no fullness. No erythema, tenderness or bleeding  around the vagina. No foreign body around the vagina. No signs of injury around the vagina. Vaginal discharge found.  Chaperone present for exam  Musculoskeletal: Normal range of motion. She exhibits no edema and no tenderness.  Lymphadenopathy:       Right: No inguinal adenopathy present.       Left: No inguinal adenopathy present.  Neurological: She is alert and oriented to person, place, and time.  Skin: Skin is warm and dry.  Psychiatric: She has a normal mood and affect. Her behavior is normal. Judgment and thought content normal.    ED Course   Procedures (including critical care time) Results for orders placed during the hospital encounter of 01/02/13  WET PREP, GENITAL      Result Value Range   Yeast Wet Prep HPF POC NONE SEEN  NONE SEEN   Trich, Wet Prep NONE SEEN  NONE SEEN   Clue Cells Wet Prep HPF POC FEW (*) NONE SEEN   WBC, Wet Prep HPF POC RARE (*) NONE SEEN  URINALYSIS, ROUTINE W REFLEX MICROSCOPIC      Result Value Range   Color, Urine YELLOW  YELLOW   APPearance CLOUDY (*) CLEAR   Specific Gravity, Urine 1.026  1.005 - 1.030   pH 6.0  5.0 - 8.0   Glucose, UA NEGATIVE  NEGATIVE mg/dL   Hgb urine dipstick NEGATIVE  NEGATIVE   Bilirubin Urine NEGATIVE  NEGATIVE   Ketones, ur 15 (*) NEGATIVE mg/dL   Protein, ur NEGATIVE  NEGATIVE mg/dL   Urobilinogen, UA 1.0  0.0 - 1.0 mg/dL   Nitrite NEGATIVE  NEGATIVE   Leukocytes, UA TRACE (*) NEGATIVE  RAPID HIV SCREEN ([facility])      Result Value Range   SUDS Rapid HIV Screen NON REACTIVE  NON REACTIVE  URINE MICROSCOPIC-ADD ON      Result Value Range   Squamous Epithelial / LPF FEW (*) RARE   WBC, UA 3-6  <3 WBC/hpf   Bacteria, UA RARE  RARE   Crystals CA OXALATE CRYSTALS (*) NEGATIVE  POCT PREGNANCY, URINE      Result Value Range   Preg Test, Ur NEGATIVE  NEGATIVE   No results found.    EKG Interpretation   None       MDM   1. UTI (lower urinary tract infection)   2. Vaginal discharge     Patient with vaginal discharge, dysuria, and suprapubic pain.  Will check labs, UA, and pelvic.    Pelvic exam remarkable for vaginal discharge only.  No adnexal tenderness. No bleeding.  Vaginal discharge as this seemed to STD. Will give Rocephin and azithromycin. Patient has taken some Keflex for the dysuria, however she did not have a full prescription. I do not see evidence of UTI on the urinalysis, but I believe that the results could be limited because of the patient's antibiotics. I'm going to refill the Keflex so that she can finish the course.   Abdomen is  benign. Afebrile. Will discharge to home with primary care followup. Return precautions are given. Patient is stable and ready for discharge.    Roxy Horseman, PA-C 01/02/13 [phone]

## 2013-01-02 NOTE — ED Notes (Signed)
Pt reports abd pain, painful urination, and vaginal discharge X 1 month. NAD at this time.

## 2013-01-03 LAB — GC/CHLAMYDIA PROBE AMP: GC Probe RNA: NEGATIVE

## 2013-01-06 NOTE — ED Provider Notes (Signed)
Medical screening examination/treatment/procedure(s) were performed by non-physician practitioner and as supervising physician I was immediately available for consultation/collaboration.  EKG Interpretation   None         Suzi Roots, MD 01/06/13 1512

## 2013-02-15 ENCOUNTER — Encounter (HOSPITAL_COMMUNITY): Payer: Self-pay | Admitting: Emergency Medicine

## 2013-02-15 DIAGNOSIS — R109 Unspecified abdominal pain: Secondary | ICD-10-CM | POA: Insufficient documentation

## 2013-02-15 DIAGNOSIS — M545 Low back pain, unspecified: Secondary | ICD-10-CM | POA: Insufficient documentation

## 2013-02-15 DIAGNOSIS — Z8669 Personal history of other diseases of the nervous system and sense organs: Secondary | ICD-10-CM | POA: Insufficient documentation

## 2013-02-15 DIAGNOSIS — Z888 Allergy status to other drugs, medicaments and biological substances status: Secondary | ICD-10-CM | POA: Insufficient documentation

## 2013-02-15 DIAGNOSIS — Z79899 Other long term (current) drug therapy: Secondary | ICD-10-CM | POA: Insufficient documentation

## 2013-02-15 DIAGNOSIS — F489 Nonpsychotic mental disorder, unspecified: Secondary | ICD-10-CM | POA: Insufficient documentation

## 2013-02-15 DIAGNOSIS — F17200 Nicotine dependence, unspecified, uncomplicated: Secondary | ICD-10-CM | POA: Insufficient documentation

## 2013-02-15 DIAGNOSIS — Z3202 Encounter for pregnancy test, result negative: Secondary | ICD-10-CM | POA: Insufficient documentation

## 2013-02-15 DIAGNOSIS — Z8742 Personal history of other diseases of the female genital tract: Secondary | ICD-10-CM | POA: Insufficient documentation

## 2013-02-15 DIAGNOSIS — N390 Urinary tract infection, site not specified: Secondary | ICD-10-CM | POA: Insufficient documentation

## 2013-02-15 NOTE — ED Notes (Addendum)
Pt states she started having "signed of a UTI" today and starting tonight pt is having lower back pain going into her legs and lower abd pain

## 2013-02-16 ENCOUNTER — Emergency Department (HOSPITAL_COMMUNITY)
Admission: EM | Admit: 2013-02-16 | Discharge: 2013-02-16 | Disposition: A | Discharge status: Home / Self Care | Payer: Self-pay | Attending: Emergency Medicine | Admitting: Emergency Medicine

## 2013-02-16 ENCOUNTER — Emergency Department (HOSPITAL_COMMUNITY): Payer: Self-pay

## 2013-02-16 ENCOUNTER — Encounter (HOSPITAL_COMMUNITY): Payer: Self-pay | Admitting: Radiology

## 2013-02-16 DIAGNOSIS — N390 Urinary tract infection, site not specified: Secondary | ICD-10-CM

## 2013-02-16 LAB — COMPREHENSIVE METABOLIC PANEL
ALT: 11 U/L (ref 0–35)
CO2: 26 mEq/L (ref 19–32)
Calcium: 9.4 mg/dL (ref 8.4–10.5)
Creatinine, Ser: 0.93 mg/dL (ref 0.50–1.10)
GFR calc Af Amer: >90 mL/min (ref >90)
GFR calc non Af Amer: 83 mL/min — ABNORMAL LOW (ref >90)
Glucose, Bld: 98 mg/dL (ref 70–99)
Sodium: 142 mEq/L (ref 135–145)
Total Protein: 7.8 g/dL (ref 6.0–8.3)

## 2013-02-16 LAB — URINALYSIS, ROUTINE W REFLEX MICROSCOPIC
Glucose, UA: NEGATIVE mg/dL
Protein, ur: 100 mg/dL — AB
Urobilinogen, UA: 1 mg/dL (ref 0.0–1.0)

## 2013-02-16 LAB — CBC WITH DIFFERENTIAL/PLATELET
Basophils Absolute: 0 10*3/uL (ref 0.0–0.1)
Eosinophils Absolute: 0 10*3/uL (ref 0.0–0.7)
Eosinophils Relative: 0 % (ref 0–5)
HCT: 41 % (ref 36.0–46.0)
Lymphs Abs: 1.7 10*3/uL (ref 0.7–4.0)
MCH: 32.8 pg (ref 26.0–34.0)
MCV: 94 fL (ref 78.0–100.0)
Monocytes Absolute: 0.7 10*3/uL (ref 0.1–1.0)
Platelets: 254 10*3/uL (ref 150–400)
RDW: 12.4 % (ref 11.5–15.5)

## 2013-02-16 LAB — URINE MICROSCOPIC-ADD ON

## 2013-02-16 MED ORDER — CEPHALEXIN 500 MG PO CAPS: 500.0000 mg | ORAL_CAPSULE | Freq: Four times a day (QID) | ORAL | Status: DC

## 2013-02-16 MED ORDER — OXYCODONE-ACETAMINOPHEN 5-325 MG PO TABS
1.0000 | ORAL_TABLET | Freq: Once | ORAL | Status: AC
  Administered 2013-02-16: 1 via ORAL
  Filled 2013-02-16: qty 1

## 2013-02-16 MED ORDER — CEFTRIAXONE SODIUM 1 G IJ SOLR
1.0000 g | Freq: Once | INTRAMUSCULAR | Status: AC
  Administered 2013-02-16: 1 g via INTRAMUSCULAR
  Filled 2013-02-16: qty 10

## 2013-02-16 MED ORDER — LIDOCAINE HCL (PF) 1 % IJ SOLN
INTRAMUSCULAR | Status: AC
  Administered 2013-02-16: 5 mL
  Filled 2013-02-16: qty 5

## 2013-02-16 MED ORDER — PHENAZOPYRIDINE HCL 95 MG PO TABS: 95.0000 mg | ORAL_TABLET | Freq: Three times a day (TID) | ORAL | Status: DC | PRN

## 2013-02-16 NOTE — ED Notes (Signed)
Pt reports her friend will be driving her home. Pt A&Ox4 upon discharge, verbalizing no complaints at this time.

## 2013-02-16 NOTE — ED Notes (Signed)
Pt ambulated to room 2 POD A with slow, steady gait.

## 2013-02-16 NOTE — ED Provider Notes (Signed)
CSN: [id number]     Arrival date & time 02/15/13  2321 History   First MD Initiated Contact with Patient 02/16/13 0207     Chief Complaint  Patient presents with  . Dysuria  . Back Pain   (Consider location/radiation/quality/duration/timing/severity/associated sxs/prior Treatment) Patient is a 27 y.o. female presenting with dysuria and back pain.  Dysuria Associated symptoms: no fever and no vomiting   Back Pain Associated symptoms: dysuria   Associated symptoms: no chest pain, no fever and no headaches    Hx per PT - LBP and dysuria and hematuria. No F/C, no N/V. H/o UTI feels the same. Pain is 6-8/10 in severity. No h/o kidney stones. Pain more L sided. No vag bleeding or discharge.onset earler today constant unchnaged     Past Medical History  Diagnosis Date  . Tumor of ovary   . Headache(784.0)   . Boils   . Polycystic ovarian syndrome   . Mental disorder    Past Surgical History  Procedure Laterality Date  . Tumor removal     No family history on file. History  Substance Use Topics  . Smoking status: Current Every Day Smoker -- 0.25 packs/day    Types: Cigarettes  . Smokeless tobacco: Not on file  . Alcohol Use: Yes     Comment: SMOKED MARIJUANA- JULY,  COCAINE-  NONE THIS YEAR.   OB History   Grav Para Term Preterm Abortions TAB SAB Ect Mult Living   0              Review of Systems  Constitutional: Negative for fever and chills.  Respiratory: Negative for shortness of breath.   Cardiovascular: Negative for chest pain.  Gastrointestinal: Negative for vomiting.  Genitourinary: Positive for dysuria.  Musculoskeletal: Positive for back pain. Negative for neck pain.  Skin: Negative for rash.  Neurological: Negative for headaches.  All other systems reviewed and are negative.    Allergies  Ibuprofen  Home Medications   Current Outpatient Rx  Name  Route  Sig  Dispense  Refill  . Prenatal Vit-Fe Fumarate-FA (PRENATAL MULTIVITAMIN) TABS   Oral  Take 1 tablet by mouth daily.          BP 104/59  Pulse 84  Temp(Src) 97.5 F (36.4 C) (Oral)  Resp 20  Wt 200 lb (90.719 kg)  SpO2 96% Physical Exam  Constitutional: She is oriented to person, place, and time. She appears well-developed and well-nourished.  HENT:  Head: Normocephalic and atraumatic.  Mouth/Throat: Oropharynx is clear and moist.  Eyes: EOM are normal. Pupils are equal, round, and reactive to light.  Neck: Neck supple.  Cardiovascular: Normal rate, regular rhythm and intact distal pulses.   Pulmonary/Chest: Effort normal and breath sounds normal. No stridor. No respiratory distress.  Abdominal: Soft. Bowel sounds are normal. She exhibits no distension. There is no tenderness. There is no rebound and no guarding.  No CVAT  Musculoskeletal: Normal range of motion. She exhibits no edema and no tenderness.  Neurological: She is alert and oriented to person, place, and time.  Skin: Skin is warm and dry.    ED Course  Procedures (including critical care time) Labs Review Labs Reviewed  URINALYSIS, ROUTINE W REFLEX MICROSCOPIC - Abnormal; Notable for the following:    Color, Urine AMBER (*)    APPearance CLOUDY (*)    Specific Gravity, Urine 1.031 (*)    Hgb urine dipstick LARGE (*)    Bilirubin Urine SMALL (*)    Protein, ur  100 (*)    Leukocytes, UA LARGE (*)    All other components within normal limits  COMPREHENSIVE METABOLIC PANEL - Abnormal; Notable for the following:    GFR calc non Af Amer 83 (*)    All other components within normal limits  URINE CULTURE  CBC WITH DIFFERENTIAL  URINE MICROSCOPIC-ADD ON  POCT PREGNANCY, URINE   Imaging Review Ct Abdomen Pelvis Wo Contrast  02/16/2013   CLINICAL DATA:  Left flank pain.  EXAM: CT ABDOMEN AND PELVIS WITHOUT CONTRAST  TECHNIQUE: Multidetector CT imaging of the abdomen and pelvis was performed following the standard protocol without intravenous contrast.  COMPARISON:  None.  FINDINGS: BODY WALL: Small  fatty umbilical hernia. Nodularity in the subcutaneous fat of the right gluteal region, presumably from previous medication injection.  LOWER CHEST: Unremarkable.  ABDOMEN/PELVIS:  Liver: No focal abnormality.  Biliary: No evidence of biliary obstruction or stone.  Pancreas: Unremarkable.  Spleen: Unremarkable.  Adrenals: Unremarkable.  Kidneys and ureters: No hydronephrosis or stone.  Bladder: Unremarkable.  Reproductive: 5 cm long left ovary, similar to dimensions on pelvic sonography 10/27/2011.  Bowel: No obstruction. Normal appendix.  Retroperitoneum: No mass or adenopathy.  Peritoneum: Small volume free pelvic fluid, likely physiologic.  Vascular: No acute abnormality.  OSSEOUS: Vacuum phenomenon at the L5-S1 disc.  IMPRESSION: 1. No urolithiasis or hydronephrosis. 2. Free pelvic fluid, physiologic volume.   Electronically Signed   By: Tiburcio Pea M.D.   On: 02/16/2013 03:09    PO Percocet IM rocephin Pyridium  Plan d/c home RX Keflex and Pyridium. UTI precautions provided. Patient states understanding all discharge and followup instructions.   MDM  Diagnosis: UTI  Evaluated with labs and imaging urinalysis reviewed as above. Antibiotic provided. Pain medications provided. Vital Signs and nurses notes reviewed and considered.    Sunnie Nielsen, MD 02/16/13 [phone]

## 2013-02-18 LAB — URINE CULTURE: Colony Count: >=100000

## 2013-02-19 ENCOUNTER — Telehealth (HOSPITAL_COMMUNITY): Payer: Self-pay | Admitting: Emergency Medicine

## 2013-02-19 NOTE — ED Notes (Signed)
Post ED Visit - Positive Culture Follow-up  Culture report reviewed by antimicrobial stewardship pharmacist: []  Wes Dulaney, Pharm.D., BCPS [x]  Celedonio Miyamoto, [address].D., BCPS []  Georgina Pillion, [address].D., BCPS []  [place], [place].D., BCPS, AAHIVP []  Estella Husk, Pharm.D., BCPS, AAHIVP  Positive urine culture Treated with Keflex, organism sensitive to the same and no further patient follow-up is required at this time.  Kylie A Holland 02/19/2013, 12:27 PM

## 2013-05-04 ENCOUNTER — Encounter (HOSPITAL_COMMUNITY): Payer: Self-pay | Admitting: *Deleted

## 2013-05-04 ENCOUNTER — Inpatient Hospital Stay (HOSPITAL_COMMUNITY)
Admission: AD | Admit: 2013-05-04 | Discharge: 2013-05-04 | Disposition: A | Discharge status: Home / Self Care | Payer: Self-pay | Source: Ambulatory Visit | Attending: Obstetrics and Gynecology | Admitting: Obstetrics and Gynecology

## 2013-05-04 DIAGNOSIS — A499 Bacterial infection, unspecified: Secondary | ICD-10-CM | POA: Insufficient documentation

## 2013-05-04 DIAGNOSIS — B9689 Other specified bacterial agents as the cause of diseases classified elsewhere: Secondary | ICD-10-CM

## 2013-05-04 DIAGNOSIS — N390 Urinary tract infection, site not specified: Secondary | ICD-10-CM

## 2013-05-04 DIAGNOSIS — E282 Polycystic ovarian syndrome: Secondary | ICD-10-CM | POA: Insufficient documentation

## 2013-05-04 DIAGNOSIS — F1210 Cannabis abuse, uncomplicated: Secondary | ICD-10-CM

## 2013-05-04 DIAGNOSIS — N926 Irregular menstruation, unspecified: Secondary | ICD-10-CM | POA: Insufficient documentation

## 2013-05-04 DIAGNOSIS — N760 Acute vaginitis: Secondary | ICD-10-CM

## 2013-05-04 DIAGNOSIS — F17200 Nicotine dependence, unspecified, uncomplicated: Secondary | ICD-10-CM | POA: Insufficient documentation

## 2013-05-04 DIAGNOSIS — N915 Oligomenorrhea, unspecified: Secondary | ICD-10-CM

## 2013-05-04 DIAGNOSIS — R109 Unspecified abdominal pain: Secondary | ICD-10-CM | POA: Insufficient documentation

## 2013-05-04 LAB — URINALYSIS, ROUTINE W REFLEX MICROSCOPIC
Bilirubin Urine: NEGATIVE
Glucose, UA: NEGATIVE mg/dL
KETONES UR: NEGATIVE mg/dL
NITRITE: NEGATIVE
PH: 6 (ref 5.0–8.0)
PROTEIN: NEGATIVE mg/dL
Specific Gravity, Urine: >1.03 — ABNORMAL HIGH (ref 1.005–1.030)
Urobilinogen, UA: 1 mg/dL (ref 0.0–1.0)

## 2013-05-04 LAB — WET PREP, GENITAL
TRICH WET PREP: NONE SEEN
Yeast Wet Prep HPF POC: NONE SEEN

## 2013-05-04 LAB — GC/CHLAMYDIA PROBE AMP
CT PROBE, AMP APTIMA: NEGATIVE
GC Probe RNA: NEGATIVE

## 2013-05-04 LAB — POCT PREGNANCY, URINE: Preg Test, Ur: NEGATIVE

## 2013-05-04 LAB — URINE MICROSCOPIC-ADD ON

## 2013-05-04 MED ORDER — KETOROLAC TROMETHAMINE 60 MG/2ML IM SOLN
60.0000 mg | Freq: Once | INTRAMUSCULAR | Status: AC
  Administered 2013-05-04: 60 mg via INTRAMUSCULAR
  Filled 2013-05-04: qty 2

## 2013-05-04 MED ORDER — CIPROFLOXACIN HCL 500 MG PO TABS: 500.0000 mg | ORAL_TABLET | Freq: Two times a day (BID) | ORAL | Status: DC

## 2013-05-04 MED ORDER — METRONIDAZOLE 500 MG PO TABS: 500.0000 mg | ORAL_TABLET | Freq: Two times a day (BID) | ORAL | Status: DC

## 2013-05-04 NOTE — Discharge Instructions (Signed)
Urinary Tract Infection °Urinary tract infections (UTIs) can develop anywhere along your urinary tract. Your urinary tract is your body's drainage system for removing wastes and extra water. Your urinary tract includes two kidneys, two ureters, a bladder, and a urethra. Your kidneys are a pair of bean-shaped organs. Each kidney is about the size of your fist. They are located below your ribs, one on each side of your spine. °CAUSES °Infections are caused by microbes, which are microscopic organisms, including fungi, viruses, and bacteria. These organisms are so small that they can only be seen through a microscope. Bacteria are the microbes that most commonly cause UTIs. °SYMPTOMS  °Symptoms of UTIs may vary by age and gender of the patient and by the location of the infection. Symptoms in young women typically include a frequent and intense urge to urinate and a painful, burning feeling in the bladder or urethra during urination. Older women and men are more likely to be tired, shaky, and weak and have muscle aches and abdominal pain. A fever may mean the infection is in your kidneys. Other symptoms of a kidney infection include pain in your back or sides below the ribs, nausea, and vomiting. °DIAGNOSIS °To diagnose a UTI, your caregiver will ask you about your symptoms. Your caregiver also will ask to provide a urine sample. The urine sample will be tested for bacteria and white blood cells. White blood cells are made by your body to help fight infection. °TREATMENT  °Typically, UTIs can be treated with medication. Because most UTIs are caused by a bacterial infection, they usually can be treated with the use of antibiotics. The choice of antibiotic and length of treatment depend on your symptoms and the type of bacteria causing your infection. °HOME CARE INSTRUCTIONS °· If you were prescribed antibiotics, take them exactly as your caregiver instructs you. Finish the medication even if you feel better after you  have only taken some of the medication. °· Drink enough water and fluids to keep your urine clear or pale yellow. °· Avoid caffeine, tea, and carbonated beverages. They tend to irritate your bladder. °· Empty your bladder often. Avoid holding urine for long periods of time. °· Empty your bladder before and after sexual intercourse. °· After a bowel movement, women should cleanse from front to back. Use each tissue only once. °SEEK MEDICAL CARE IF:  °· You have back pain. °· You develop a fever. °· Your symptoms do not begin to resolve within 3 days. °SEEK IMMEDIATE MEDICAL CARE IF:  °· You have severe back pain or lower abdominal pain. °· You develop chills. °· You have nausea or vomiting. °· You have continued burning or discomfort with urination. °MAKE SURE YOU:  °· Understand these instructions. °· Will watch your condition. °· Will get help right away if you are not doing well or get worse. °Document Released: 11/27/2004 Document Revised: 08/19/2011 Document Reviewed: 03/28/2011 °ExitCare® Patient Information ©2014 [employer]. ° °Bacterial Vaginosis °Bacterial vaginosis is a vaginal infection that occurs when the normal balance of bacteria in the vagina is disrupted. It results from an overgrowth of certain bacteria. This is the most common vaginal infection in women of childbearing age. Treatment is important to prevent complications, especially in pregnant women, as it can cause a premature delivery. °CAUSES  °Bacterial vaginosis is caused by an increase in harmful bacteria that are normally present in smaller amounts in the vagina. Several different kinds of bacteria can cause bacterial vaginosis. However, the reason that the condition   develops is not fully understood. °RISK FACTORS °Certain activities or behaviors can put you at an increased risk of developing bacterial vaginosis, including: °· Having a new sex partner or multiple sex partners. °· Douching. °· Using an intrauterine device (IUD) for  contraception. °Women do not get bacterial vaginosis from toilet seats, bedding, swimming pools, or contact with objects around them. °SIGNS AND SYMPTOMS  °Some women with bacterial vaginosis have no signs or symptoms. Common symptoms include: °· Grey vaginal discharge. °· A fishlike odor with discharge, especially after sexual intercourse. °· Itching or burning of the vagina and vulva. °· Burning or pain with urination. °DIAGNOSIS  °Your health care provider will take a medical history and examine the vagina for signs of bacterial vaginosis. A sample of vaginal fluid may be taken. Your health care provider will look at this sample under a microscope to check for bacteria and abnormal cells. A vaginal pH test may also be done.  °TREATMENT  °Bacterial vaginosis may be treated with antibiotic medicines. These may be given in the form of a pill or a vaginal cream. A second round of antibiotics may be prescribed if the condition comes back after treatment.  °HOME CARE INSTRUCTIONS  °· Only take over-the-counter or prescription medicines as directed by your health care provider. °· If antibiotic medicine was prescribed, take it as directed. Make sure you finish it even if you start to feel better. °· Do not have sex until treatment is completed. °· Tell all sexual partners that you have a vaginal infection. They should see their health care provider and be treated if they have problems, such as a mild rash or itching. °· Practice safe sex by using condoms and only having one sex partner. °SEEK MEDICAL CARE IF:  °· Your symptoms are not improving after 3 days of treatment. °· You have increased discharge or pain. °· You have a fever. °MAKE SURE YOU:  °· Understand these instructions. °· Will watch your condition. °· Will get help right away if you are not doing well or get worse. °FOR MORE INFORMATION  °Centers for Disease Control and Prevention, Division of STD Prevention: www.cdc.gov/std °American Sexual Health  Association (ASHA): www.ashastd.org  °Document Released: 02/17/2005 Document Revised: 12/08/2012 Document Reviewed: 09/29/2012 °ExitCare® Patient Information ©2014 [employer]. ° °

## 2013-05-04 NOTE — MAU Note (Signed)
Pt reports lower abd pain and on rt lower abd. Pain after urination, voiding small amounts.

## 2013-05-04 NOTE — MAU Provider Note (Signed)
Chief Complaint: Abdominal Pain   First Provider Initiated Contact with Patient 05/04/13 0144     SUBJECTIVE HPI: [name] [name] is a 28 y.o. G0P0 female who presents with possible UTI, low abdominal cramping that she rates 7/10 on pain scale and being late for her period. Reports dysuria, urgency and urinating small amounts very frequently. She has been diagnosed with 2 UTIs in the past 4 months and has taken Keflex as directed. Symptoms resolved initially, but then returned. Urine culture from 02/15/2013 was positive for Escherichia coli.  Patient has PCOS and has history of irregular cycles. She is sexually active and not currently using birth control. Same partner x3 years.  Past Medical History  Diagnosis Date  . Tumor of ovary   . Headache(784.0)   . Boils   . Polycystic ovarian syndrome   . Mental disorder    OB History  Gravida Para Term Preterm AB SAB TAB Ectopic Multiple Living  0                Past Surgical History  Procedure Laterality Date  . Tumor removal     History   Social History  . Marital Status: Divorced    Spouse Name: N/A    Number of Children: N/A  . Years of Education: N/A   Occupational History  . Not on file.   Social History Main Topics  . Smoking status: Current Every Day Smoker -- 0.25 packs/day    Types: Cigarettes  . Smokeless tobacco: Not on file  . Alcohol Use: Yes     Comment: SMOKED MARIJUANA- SMOKED LAST  AT 0030 today.  . Drug Use: Yes    Special: Marijuana, Cocaine     Comment: LAST TIME - COCAINE -  SUNDAY  . Sexual Activity: Yes    Birth Control/ Protection: None   Other Topics Concern  . Not on file   Social History Narrative  . No narrative on file   No current facility-administered medications on file prior to encounter.   Current Outpatient Prescriptions on File Prior to Encounter  Medication Sig Dispense Refill  . Prenatal Vit-Fe Fumarate-FA (PRENATAL MULTIVITAMIN) TABS Take 1 tablet by mouth daily.       . phenazopyridine (PYRIDIUM) 95 MG tablet Take 1 tablet (95 mg total) by mouth 3 (three) times daily as needed for pain.  10 tablet  0   Allergies  Allergen Reactions  . Ibuprofen Other (See Comments)    Stomach upset    ROS: Pertinent positive items in HPI. Negative for fever, chills, dyspareunia, nausea, vomiting, diarrhea, constipation, flank pain.   OBJECTIVE Blood pressure 125/67, pulse 91, temperature 98.7 F (37.1 C), resp. rate 20, height 5\' 11"  (1.803 m), weight 87.998 kg (194 lb), last menstrual period 03/03/2013, SpO2 100.00%. GENERAL: Well-developed, well-nourished female in no acute distress. Smells like marijuana. HEENT: Normocephalic HEART: normal rate RESP: normal effort ABDOMEN: Soft, mild suprapubic tenderness. Positive bowel sounds. No CVA tenderness. EXTREMITIES: Nontender, no edema NEURO: Alert and oriented SPECULUM EXAM: NEFG, small amount of pinkish-white, malodorous discharge, small amount of bright red blood noted at os, cervix clean. No mucopurulent discharge. BIMANUAL: cervix closed; uterus normal size, no adnexal tenderness or masses. No cervical motion tenderness.  LAB RESULTS Results for orders placed during the hospital encounter of 05/04/13 (from the past 24 hour(s))  URINALYSIS, ROUTINE W REFLEX MICROSCOPIC     Status: Abnormal   Collection Time    05/04/13 12:45 AM      Result  Value Ref Range   Color, Urine YELLOW  YELLOW   APPearance CLEAR  CLEAR   Specific Gravity, Urine >1.030 (*) 1.005 - 1.030   pH 6.0  5.0 - 8.0   Glucose, UA NEGATIVE  NEGATIVE mg/dL   Hgb urine dipstick TRACE (*) NEGATIVE   Bilirubin Urine NEGATIVE  NEGATIVE   Ketones, ur NEGATIVE  NEGATIVE mg/dL   Protein, ur NEGATIVE  NEGATIVE mg/dL   Urobilinogen, UA 1.0  0.0 - 1.0 mg/dL   Nitrite NEGATIVE  NEGATIVE   Leukocytes, UA SMALL (*) NEGATIVE  URINE MICROSCOPIC-ADD ON     Status: None   Collection Time    05/04/13 12:45 AM      Result Value Ref Range   Squamous  Epithelial / LPF RARE  RARE   WBC, UA 0-2  <3 WBC/hpf   RBC / HPF 3-6  <3 RBC/hpf   Bacteria, UA RARE  RARE  POCT PREGNANCY, URINE     Status: None   Collection Time    05/04/13  1:02 AM      Result Value Ref Range   Preg Test, Ur NEGATIVE  NEGATIVE  WET PREP, GENITAL     Status: Abnormal   Collection Time    05/04/13  2:00 AM      Result Value Ref Range   Yeast Wet Prep HPF POC NONE SEEN  NONE SEEN   Trich, Wet Prep NONE SEEN  NONE SEEN   Clue Cells Wet Prep HPF POC MODERATE (*) NONE SEEN   WBC, Wet Prep HPF POC FEW (*) NONE SEEN    IMAGING No results found.  MAU COURSE Toradol given.  ASSESSMENT 1. Recurrent UTI   2. BV (bacterial vaginosis)     PLAN Discharge home in stable condition. GC/CT pending. Urine culture pending. We'll treat UTI with a one-week course of Cipro and hopes that change in antibiotic and long course of Cipro will treat patient's UTI successfully.     Follow-up Information   Follow up with [facility] (As needed, If symptoms worsen)    Contact information:   [address]  [place] [address] [place] [address] [phone]       Follow up with Gynecologist. (As needed for gynecology care)        Medication List    STOP taking these medications       cephALEXin 500 MG capsule  Commonly known as:  KEFLEX      TAKE these medications       ciprofloxacin 500 MG tablet  Commonly known as:  CIPRO  Take 1 tablet (500 mg total) by mouth 2 (two) times daily.     metroNIDAZOLE 500 MG tablet  Commonly known as:  FLAGYL  Take 1 tablet (500 mg total) by mouth 2 (two) times daily.     phenazopyridine 95 MG tablet  Commonly known as:  PYRIDIUM  Take 1 tablet (95 mg total) by mouth 3 (three) times daily as needed for pain.     prenatal multivitamin Tabs tablet  Take 1 tablet by mouth daily.       Alamillo, CNM 05/04/2013  2:35 AM

## 2013-05-04 NOTE — MAU Note (Addendum)
PT SAYS SHE HAS HAD   2 OTHER  UTI'S   IN LESS THAN 4 MTHS-  SHE HAS TAKEN ALL MEDS.  LAST  SEX- TODAY.   PAIN-  IS ABD - DENIES ODOR- D/C- ITCHING     .   LMP- 1-1.-2015.   SPOTTING IN [place],   MARCH- NOTHING.   NO  BIRTH CONTROL.     NO HPT.

## 2013-05-05 ENCOUNTER — Emergency Department (HOSPITAL_COMMUNITY)
Admission: EM | Admit: 2013-05-05 | Discharge: 2013-05-05 | Disposition: A | Discharge status: Home / Self Care | Payer: Self-pay | Attending: Emergency Medicine | Admitting: Emergency Medicine

## 2013-05-05 ENCOUNTER — Encounter (HOSPITAL_COMMUNITY): Payer: Self-pay | Admitting: Emergency Medicine

## 2013-05-05 DIAGNOSIS — R5381 Other malaise: Secondary | ICD-10-CM | POA: Insufficient documentation

## 2013-05-05 DIAGNOSIS — F17200 Nicotine dependence, unspecified, uncomplicated: Secondary | ICD-10-CM | POA: Insufficient documentation

## 2013-05-05 DIAGNOSIS — R5383 Other fatigue: Secondary | ICD-10-CM

## 2013-05-05 DIAGNOSIS — Z8742 Personal history of other diseases of the female genital tract: Secondary | ICD-10-CM | POA: Insufficient documentation

## 2013-05-05 DIAGNOSIS — Z8659 Personal history of other mental and behavioral disorders: Secondary | ICD-10-CM | POA: Insufficient documentation

## 2013-05-05 DIAGNOSIS — Z87440 Personal history of urinary (tract) infections: Secondary | ICD-10-CM | POA: Insufficient documentation

## 2013-05-05 DIAGNOSIS — Z3202 Encounter for pregnancy test, result negative: Secondary | ICD-10-CM | POA: Insufficient documentation

## 2013-05-05 DIAGNOSIS — Z872 Personal history of diseases of the skin and subcutaneous tissue: Secondary | ICD-10-CM | POA: Insufficient documentation

## 2013-05-05 DIAGNOSIS — R55 Syncope and collapse: Secondary | ICD-10-CM | POA: Insufficient documentation

## 2013-05-05 HISTORY — DX: Anxiety disorder, unspecified: F41.9

## 2013-05-05 LAB — COMPREHENSIVE METABOLIC PANEL
ALT: 11 U/L (ref 0–35)
AST: 16 U/L (ref 0–37)
Albumin: 3.2 g/dL — ABNORMAL LOW (ref 3.5–5.2)
Alkaline Phosphatase: 60 U/L (ref 39–117)
BILIRUBIN TOTAL: 0.4 mg/dL (ref 0.3–1.2)
BUN: 9 mg/dL (ref 6–23)
CO2: 21 mEq/L (ref 19–32)
CREATININE: 1.11 mg/dL — AB (ref 0.50–1.10)
Calcium: 9 mg/dL (ref 8.4–10.5)
Chloride: 105 mEq/L (ref 96–112)
GFR calc Af Amer: 78 mL/min — ABNORMAL LOW (ref >90)
GFR calc non Af Amer: 67 mL/min — ABNORMAL LOW (ref >90)
Glucose, Bld: 88 mg/dL (ref 70–99)
Potassium: 3.7 mEq/L (ref 3.7–5.3)
SODIUM: 140 meq/L (ref 137–147)
Total Protein: 6.9 g/dL (ref 6.0–8.3)

## 2013-05-05 LAB — CBC WITH DIFFERENTIAL/PLATELET
BASOS ABS: 0 10*3/uL (ref 0.0–0.1)
BASOS PCT: 0 % (ref 0–1)
Eosinophils Absolute: 0 10*3/uL (ref 0.0–0.7)
Eosinophils Relative: 0 % (ref 0–5)
HCT: 38.7 % (ref 36.0–46.0)
Hemoglobin: 13.4 g/dL (ref 12.0–15.0)
LYMPHS PCT: 8 % — AB (ref 12–46)
Lymphs Abs: 1 10*3/uL (ref 0.7–4.0)
MCH: 32.4 pg (ref 26.0–34.0)
MCHC: 34.6 g/dL (ref 30.0–36.0)
MCV: 93.5 fL (ref 78.0–100.0)
Monocytes Absolute: 1.5 10*3/uL — ABNORMAL HIGH (ref 0.1–1.0)
Monocytes Relative: 12 % (ref 3–12)
NEUTROS PCT: 80 % — AB (ref 43–77)
Neutro Abs: 10.4 10*3/uL — ABNORMAL HIGH (ref 1.7–7.7)
PLATELETS: 222 10*3/uL (ref 150–400)
RBC: 4.14 MIL/uL (ref 3.87–5.11)
RDW: 12.6 % (ref 11.5–15.5)
WBC: 13 10*3/uL — AB (ref 4.0–10.5)

## 2013-05-05 MED ORDER — ONDANSETRON 4 MG PO TBDP: 4.0000 mg | ORAL_TABLET | Freq: Three times a day (TID) | ORAL | Status: DC | PRN

## 2013-05-05 MED ORDER — ONDANSETRON HCL 4 MG/2ML IJ SOLN: 4.0000 mg | Freq: Once | INTRAMUSCULAR | Status: DC

## 2013-05-05 MED ORDER — SODIUM CHLORIDE 0.9 % IV BOLUS (SEPSIS)
1000.0000 mL | Freq: Once | INTRAVENOUS | Status: AC
  Administered 2013-05-05: 1000 mL via INTRAVENOUS

## 2013-05-05 NOTE — ED Notes (Addendum)
Orthostatic: Lying 97/52 HR 82 Sitting 90/45 HR 97  Pt began to loose conciousness while sitting, pt then laid down in the bed. Pt became very emotional after event.

## 2013-05-05 NOTE — ED Notes (Signed)
Pt ambulatory to bathroom - states that she is not experiencing any dizziness. States that she has ate and drank and feels fine.

## 2013-05-05 NOTE — ED Notes (Addendum)
Pt arrives via EMS from work ([employer] Pt states that she had passed out earlier today and was not sure how long she was down for. Pt called her boss and was waiting for relief. EMS was then called by bystanders who witnessed her starting to loose consciousness and helped her to the ground. Bystanders stated that she was out for about 5 minutes. Pt states that she has been feeling lethargic, nauseous, low appetite for today. States that she started her menstrual cycle today. Pt states that smoked marijuana at 5p. Pt has a hx of anxiety but is not taking medications. 4 Mg zofran given by EMS. 106/70 while lying down HR 70 RA 100% CBG 170

## 2013-05-05 NOTE — ED Notes (Signed)
Pt made aware that the doctor ordered a UA and a urine sample is needed. Pt states that she was at [facility] yesterday and was already diagnosed with a UTI and is taking cipro. Asked pt to let staff know when she needs to urinate.

## 2013-05-05 NOTE — Discharge Instructions (Signed)
°Emergency Department Resource Guide °1) Find a Doctor and Pay Out of Pocket °Although you won't have to find out who is covered by your insurance plan, it is a good idea to ask around and get recommendations. You will then need to call the office and see if the doctor you have chosen will accept you as a new patient and what types of options they offer for patients who are self-pay. Some doctors offer discounts or will set up payment plans for their patients who do not have insurance, but you will need to ask so you aren't surprised when you get to your appointment. ° °2) Contact Your Local Health Department °Not all health departments have doctors that can see patients for sick visits, but many do, so it is worth a call to see if yours does. If you don't know where your local health department is, you can check in your phone book. The CDC also has a tool to help you locate your state's health department, and many state websites also have listings of all of their local health departments. ° °3) Find a Walk-in Clinic °If your illness is not likely to be very severe or complicated, you may want to try a walk in clinic. These are popping up all over the country in pharmacies, drugstores, and shopping centers. They're usually staffed by nurse practitioners or physician assistants that have been trained to treat common illnesses and complaints. They're usually fairly quick and inexpensive. However, if you have serious medical issues or chronic medical problems, these are probably not your best option. ° °No Primary Care Doctor: °- Call [facility] at  832-8000 - they can help you locate a primary care doctor that  accepts your insurance, provides certain services, etc. °- Physician Referral Service- 1-800-533-3463 ° °Chronic Pain Problems: °Organization         Address  Phone   Notes  °[facility]  (336) 297-2271 Patients need to be referred by their primary care doctor.  ° °Medication  Assistance: °Organization         Address  Phone   Notes  °[place] [facility] [address] °[place], [place] [address] (336) 641-8030 --Must be a resident of [place] °-- Must have NO insurance coverage whatsoever (no Medicaid/ Medicare, etc.) °-- The pt. MUST have a primary care doctor that directs their care regularly and follows them in the community °  °[facility]  (866) 331-1348   °[employer]  (888) 892-1162   ° °Agencies that provide inexpensive medical care: °Organization         Address  Phone   Notes  °[facility]  (336) 832-8035   °[facility]    (336) 832-7272   °[facility] [address] °[place], [place] [address] (336) 832-4777   °[facility] [address], °[place] (336) 271-4999   °[facility]    (336) 373-0678   °[facility]    (336) 272-1050   °[facility] ° [address], [place] Phone:  (336) 832-4444, Fax:  (336) 832-4440 Hours of Operation:  9 am - 6 pm, M-F.  Also accepts Medicaid/Medicare and self-pay.  °[facility] ° [address], [place] Phone: (336) 832-3150, Fax: (336) 832-3151. Hours of Operation:  8:30 am - 5:30 pm, M-F.  Also accepts Medicaid and self-pay.  °[facility] [place] [address], [place] Phone: (336) 878-6027   °[facility] [address], [place], [place] (336)723-1848, Ext. 123 Mondays & Thursdays: 7-9 AM.  First 15 patients are seen on a first come, first serve basis. °  ° °Medicaid-accepting [place] Providers: ° °Organization         Address  Phone   Notes  °[facility] [address], [place] (336) 641-2100 Also accepts self-pay patients.  °[facility] [address], [place] ° (336) 856-9996   °[facility] [address], [place]  (336) 288-8857   °[facility] [address] [place] [address], [place] (336) 299-7000   °Veita Bland [address], [place]  ° (336) 373-1557 Only accepts [place] Access Medicaid patients after they have their name applied to their card.  ° °Self-Pay (no insurance) in [place]: ° °Organization         Address  Phone   Notes  °Sickle Cell Patients, [facility] [address], [place] (336) 832-1970   °[facility] [address], [place] (336) 832-4400   °[facility] [employer] ° [address], [place] (336) 992-4800   °[facility]/Dr. Osei-Bonsu ° [address] [place] [address], [place] or [address], [place] (336) 841-8500 Phone number for both [place] and [place] locations is the same.  °[facility] [address], [place] (336) 299-0000   °[facility] [address] [place] [address], [place] or [address] (336) 852-7530 °(336) 878-2260   °[facility] [address], [place] (336) 350-1642, phone; (336) 294-5005, fax Sees patients 1st and 3rd Saturday of every month.  Must not qualify for public or private insurance (i.e. Medicaid, Medicare, [place] Health Choice, Veterans' Benefits) • Household income should be no more than 200% of the poverty level •The clinic cannot treat you if you are pregnant or think you are pregnant • Sexually transmitted diseases are not treated at the clinic.  ° ° °Dental Care: °Organization         Address  Phone  Notes  °[place] [facility] [address], [place] (336) 641-6152 Accepts children up to age 21 who are enrolled in Medicaid or [place] Health Choice; pregnant women with a Medicaid card; and children who have applied for Medicaid or [place] Health Choice, but were declined, whose parents can pay a reduced fee at time of service.  °[place]  [facility] [place]  [address], [place] (336) 641-7733 Accepts children up to age 21 who are enrolled in Medicaid or [place] Health Choice; pregnant women with a Medicaid card; and children who have applied for Medicaid or  Health Choice, but were declined, whose parents can pay a reduced fee at time of service.  °[facility] ° [address], [place] (336) 641-4533 Patients are seen by appointment only. Walk-ins are not accepted. [facility] will see patients 18 years of age and older. °Monday - Tuesday (8am-5pm) °Most Wednesdays (8:30-5pm) °$30 per visit, cash only  °[facility] ° [address], [place] (336) 641-4533 Patients are seen by appointment only. Walk-ins are not accepted. [facility] will see patients 18 years of age and older. °One   Wednesday Evening (Monthly: Volunteer Based).  $30 per visit, cash only  °[facility]  (919) 537-3737 for adults; Children under age 4, call Graduate Pediatric Dentistry at (919) [phone]. Children aged 4-14, please call (919) [phone] to request a pediatric application. ° Dental services are provided in all areas of dental care including fillings, crowns and bridges, complete and partial dentures, implants, gum treatment, root canals, and extractions. Preventive care is also provided. Treatment is provided to both adults and children. °Patients are selected via a lottery and there is often a waiting list. °  °[facility] [address], °[place] ° (336) 763-8833 [email or web address] °  °[facility] [address], [place], [place] (336)723-1848, Ext. 123 Second and Fourth Thursday of each month, opens at 6:30 AM; Clinic ends at 9 AM.  Patients are seen on a first-come first-served basis, and a limited number are seen during each clinic.  ° °[facility] ° [address], [place], [place] (336) 723-7904    Eligibility Requirements °You must have lived in [place], [place], or [place] counties for at least the last three months. °  You cannot be eligible for state or federal sponsored healthcare insurance, including Veterans Administration, Medicaid, or Medicare. °  You generally cannot be eligible for healthcare insurance through your employer.  °  How to apply: °Eligibility screenings are held every Tuesday and Wednesday afternoon from 1:00 pm until 4:00 pm. You do not need an appointment for the interview!  °[facility] [address], [place], [place] 336-631-2330   °[facility]  336-342-8273   °[facility]  336-703-3100   °[facility]  336-570-6415   ° °Behavioral Health Resources in the Community: °Intensive Outpatient Programs °Organization         Address  Phone  Notes  °[facility] [address], [place], [place] 336-878-6098   °[facility] [address], [place], [place] 336-832-9800   °[facility] [address], [place], [place] ° 336-882-2125   °[facility] [address],  °[place], [place] 1-800-853-5163 or 336-641-4981   °Substance Abuse Resources °Organization         Address  Phone  Notes  °[facility]  336-882-2125   °[facility]  336-784-9470   °[facility]  336-285-9073   °[facility]  336-845-3988   °Residential & Outpatient Substance Abuse Program  1-800-659-3381   °Psychological Services °Organization         Address  Phone  Notes  °[facility]  336- 832-9600   °[facility]  336- 378-7881   °[facility] [address], [place] 1-800-853-5163 or 336-641-4981   ° °Mobile Crisis Teams °Organization         Address  Phone  Notes  °[facility], Mobile Crisis Care Unit  1-877-626-1772   °Assertive °[facility] ° [address]  [place], [place] 336-834-9664   °Sharon DeEsch [address] °[place] 336-554-5454   ° °Self-Help/Support Groups °Organization         Address  Phone             Notes  °[facility]  - variety of support groups  336- 373-1402 Call for more information  °Narcotics Anonymous (NA), [facility] [address], °[place]  2 meetings at this location  ° °  Residential Treatment Programs °Organization         Address  Phone  Notes  °[facility] [address],    °[place]  1-866-801-8205   °[facility] ° [address], [place], [place] 704-293-8524   °[facility] [address], [place] 336-845-3988 Admissions: 8am-3pm M-F  °[facility] [address]    °[place], [place] 336-841-1104   °[facility] [address], [place], [place] 336-379-7146   °[facility] [address]  °[place], [place] 336-285-9073   °[facility] Intensive Outpatient [address], [place], [place] 336-852-3033   °[facility] [address]  °[place], [place] 1-877-615-2722 or 336-784-9470   °[facility] [address] [place], [place] 336-227-7417 Accepts Medicaid  °[facility] [address]  °[place] 1-800-659-3381 Substance Abuse/Addiction Treatment  ° °[place] Behavioral Health Resources °Organization         Address  Phone  Notes  °[facility]  (888) 581-9988   °Julie Brannon, PhD [address] [place], [place]   (336) 349-5553 or (336) 951-0000   °[facility]   [address] °[place], [place] (336) 349-4454   °[facility] [address], [place], [place] (336) 342-8316 Insurance/Medicaid/sponsorship through [facility]  °[facility] [address]                                    [place], [place] (336) 342-8316 Therapy/tele-psych/case    °[facility] [address]  ° [place], [place] (336) 349-2233    °Dr. Arfeen  (336) 349-4544   °[facility] [place]  [employer] [place] [facility] 1) [address],  °2) [address], [place] °3)  [address], [place] (336) 349-3220 °(336) 342-7768 ° °(336) 342-8140   °[place] [facility] (336) 342-1394 or (336) 342-3537 (After Hours)    ° ° °

## 2013-05-05 NOTE — ED Notes (Signed)
The patient is unable to give an urine specimen at this time. The patient has been advised to use the call light for assistance to the restroom. The tech has reported to the RN in charge.

## 2013-05-05 NOTE — ED Provider Notes (Signed)
CSN: [id number]     Arrival date & time 05/05/13  0017 History   First MD Initiated Contact with Patient 05/05/13 0030     Chief Complaint  Patient presents with  . Loss of Consciousness     (Consider location/radiation/quality/duration/timing/severity/associated sxs/prior Treatment) HPI Comments: 28 year old female presents with a complaint of syncope. The patient states that she has had 2 discrete episodes of syncope today. She works the night shift, she came in to work this evening at 11:00 PM, as she was using the commode she felt faint, diffusely weak and had a syncopal event which she does not remember, came around on the floor of the stall in the bathroom, went out to work but felt diffusely weak, this was nonfocal but generalized. She felt slightly nauseated and did have an episode of diarrhea before she went back to work. 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 She denies fevers chills and has not been nauseated or vomiting but has had generalized weakness and fatigue throughout the evening. She notes that she has had recurrent urinary infections, she was seen for several hours last night at the [facility] emergency Department during which time she states that she received several medications including a shot of Toradol and oral ciprofloxacin and oral Flagyl which she states was for a urinary infection and bacterial vaginosis. According to the medical record the patient was evaluated with gonorrhea and Chlamydia probes which were both negative and was not pregnant last night. She states that her symptoms get worse when she stands up, she does not feel syncopal when she is sitting down, she has not had fevers chills chest pain arrhythmias palpitations shortness of breath swelling or rashes.  Patient is a 28 y.o. female presenting with syncope. The history is  provided by the patient, medical records and the EMS personnel.  Loss of Consciousness   Past Medical History  Diagnosis Date  . Tumor of ovary   . Headache(784.0)   . Boils   . Polycystic ovarian syndrome   . Mental disorder   . Anxiety    Past Surgical History  Procedure Laterality Date  . Tumor removal     No family history on file. History  Substance Use Topics  . Smoking status: Current Every Day Smoker -- 0.25 packs/day    Types: Cigarettes  . Smokeless tobacco: Not on file  . Alcohol Use: Yes     Comment: SMOKED MARIJUANA- JULY,  COCAINE-  NONE THIS YEAR.      SMOKED LAST  AT 0030.   OB History   Grav Para Term Preterm Abortions TAB SAB Ect Mult Living   0              Review of Systems  Cardiovascular: Positive for syncope.  All other systems reviewed and are negative.      Allergies  Ibuprofen  Home Medications   Current Outpatient Rx  Name  Route  Sig  Dispense  Refill  . Prenatal Vit-Fe Fumarate-FA (PRENATAL MULTIVITAMIN) TABS   Oral   Take 1 tablet by mouth daily.         . ondansetron (ZOFRAN ODT) 4 MG disintegrating tablet   Oral   Take 1 tablet (4 mg total) by mouth every 8 (eight) hours as needed for nausea.   10 tablet   0    BP 97/55  Pulse 97  Resp 14  SpO2 97%  LMP 05/05/2013 Physical Exam  Nursing note and vitals reviewed. Constitutional: She appears well-developed and well-nourished. No distress.  HENT:  Head: Normocephalic and atraumatic.  Mouth/Throat: Oropharynx is clear and moist. No oropharyngeal exudate.  Eyes: Conjunctivae and EOM are normal. Pupils are equal, round, and reactive to light. Right eye exhibits no discharge. Left eye exhibits no discharge. No scleral icterus.  Neck: Normal range of motion. Neck supple. No JVD present. No thyromegaly present.  Cardiovascular: Normal rate, regular rhythm, normal heart sounds and intact distal pulses.  Exam reveals no gallop and no friction rub.   No murmur  heard. Pulmonary/Chest: Effort normal and breath sounds normal. No respiratory distress. She has no wheezes. She has no rales.  Abdominal: Soft. Bowel sounds are normal. She exhibits no distension and no mass. There is no tenderness.  Musculoskeletal: Normal range of motion. She exhibits no edema and no tenderness.  Lymphadenopathy:    She has no cervical adenopathy.  Neurological: She is alert. Coordination normal.  Speech is clear, cranial nerves III through XII are intact, memory is intact, strength is normal in all 4 extremities including grips, sensation is intact to light touch and pinprick in all 4 extremities. Coordination as tested by finger-nose-finger is normal, no limb ataxia. Normal gait, normal reflexes at the patellar tendons bilaterally  Skin: Skin is warm and dry. No rash noted. No erythema.  Psychiatric: She has a normal mood and affect. Her behavior is normal.    ED Course  Procedures (including critical care time) Labs Review Labs Reviewed  CBC WITH DIFFERENTIAL - Abnormal; Notable for the following:    WBC 13.0 (*)    Neutrophils Relative % 80 (*)    Neutro Abs 10.4 (*)    Lymphocytes Relative 8 (*)    Monocytes Absolute 1.5 (*)    All other components within normal limits  COMPREHENSIVE METABOLIC PANEL - Abnormal; Notable for the following:    Creatinine, Ser 1.11 (*)    Albumin 3.2 (*)    GFR calc non Af Amer 67 (*)    GFR calc Af Amer 78 (*)    All other components within normal limits  URINALYSIS, ROUTINE W REFLEX MICROSCOPIC  PREGNANCY, URINE   Imaging Review No results found.   EKG Interpretation   Date/Time:  Thursday May 05 2013 00:52:56 EST Ventricular Rate:  83 PR Interval:  190 QRS Duration: 90 QT Interval:  347 QTC Calculation: 408 R Axis:   78 Text Interpretation:  Sinus rhythm Normal ECG since last tracing no  significant change Confirmed by Marietta Sikkema  MD, Dewey ([id number]) on 05/05/2013  4:54:09 AM      MDM   Final diagnoses:  Syncope     The patient's exam is benign, her vital signs are normal, she is afebrile, pulse of 60 and normal blood pressure. We'll obtain orthostatic vital signs, recheck urinalysis to last night it was normal. My concern is that the patient has multifactorial reasons to be ill including dehydration as she states she has had very little to eat or drink over the last several days, multiple medications which are new in her system including Toradol ciprofloxacin and Flagyl and feels sleep deprived and fatigue. Doubt more serious conditions such as cardiac arrhythmia or focal neurologic abnormality and she has a normal neurologic exam and had no seizure activity. A second syncopal episode was witnessed by coworkers.  The patient has requested discharge, she was able to ambulate  with minimal difficulty after getting IV and oral fluids. Labs from yesterday were reviewed, not pregnant, no significant UTI, the patient refuses to stay for more IV fluids despite being orthostatic and having significant symptoms on initial arrival. Again she had no symptoms on ambulation after fluids and appears much improved.   Meds given in ED:  Medications  ondansetron (ZOFRAN) injection 4 mg (4 mg Intravenous Not Given 05/05/13 0125)  sodium chloride 0.9 % bolus 1,000 mL (0 mLs Intravenous Stopped 05/05/13 0515)    New Prescriptions   ONDANSETRON (ZOFRAN ODT) 4 MG DISINTEGRATING TABLET    Take 1 tablet (4 mg total) by mouth every 8 (eight) hours as needed for nausea.      Johnna Acosta, MD 05/05/13 [id number]

## 2013-05-09 NOTE — MAU Provider Note (Signed)
Attestation of Attending Supervision of Advanced Practitioner (CNM/NP): Evaluation and management procedures were performed by the Advanced Practitioner under my supervision and collaboration.  I have reviewed the Advanced Practitioner's note and chart, and I agree with the management and plan.  Taffy Delconte 05/09/2013 12:55 PM   

## 2013-05-12 ENCOUNTER — Encounter (HOSPITAL_COMMUNITY): Payer: Self-pay | Admitting: *Deleted

## 2013-05-12 ENCOUNTER — Inpatient Hospital Stay (HOSPITAL_COMMUNITY)
Admission: AD | Admit: 2013-05-12 | Discharge: 2013-05-12 | DRG: 690 | Discharge status: Against Medical Advice | Payer: Self-pay | Source: Ambulatory Visit | Attending: Internal Medicine | Admitting: Internal Medicine

## 2013-05-12 ENCOUNTER — Inpatient Hospital Stay (HOSPITAL_COMMUNITY)
Admission: EM | Admit: 2013-05-12 | Discharge: 2013-05-13 | DRG: 690 | Disposition: A | Discharge status: Home / Self Care | Payer: Self-pay | Attending: Internal Medicine | Admitting: Internal Medicine

## 2013-05-12 ENCOUNTER — Inpatient Hospital Stay (HOSPITAL_COMMUNITY): Payer: Self-pay

## 2013-05-12 ENCOUNTER — Encounter (HOSPITAL_COMMUNITY): Payer: Self-pay | Admitting: Emergency Medicine

## 2013-05-12 DIAGNOSIS — A498 Other bacterial infections of unspecified site: Secondary | ICD-10-CM | POA: Diagnosis present

## 2013-05-12 DIAGNOSIS — Z87440 Personal history of urinary (tract) infections: Secondary | ICD-10-CM

## 2013-05-12 DIAGNOSIS — F3130 Bipolar disorder, current episode depressed, mild or moderate severity, unspecified: Secondary | ICD-10-CM | POA: Diagnosis present

## 2013-05-12 DIAGNOSIS — F319 Bipolar disorder, unspecified: Secondary | ICD-10-CM | POA: Diagnosis present

## 2013-05-12 DIAGNOSIS — F1921 Other psychoactive substance dependence, in remission: Secondary | ICD-10-CM

## 2013-05-12 DIAGNOSIS — R55 Syncope and collapse: Secondary | ICD-10-CM | POA: Diagnosis present

## 2013-05-12 DIAGNOSIS — R42 Dizziness and giddiness: Secondary | ICD-10-CM | POA: Diagnosis present

## 2013-05-12 DIAGNOSIS — F1910 Other psychoactive substance abuse, uncomplicated: Secondary | ICD-10-CM | POA: Diagnosis present

## 2013-05-12 DIAGNOSIS — E860 Dehydration: Secondary | ICD-10-CM | POA: Diagnosis present

## 2013-05-12 DIAGNOSIS — R7989 Other specified abnormal findings of blood chemistry: Secondary | ICD-10-CM

## 2013-05-12 DIAGNOSIS — E282 Polycystic ovarian syndrome: Secondary | ICD-10-CM | POA: Diagnosis present

## 2013-05-12 DIAGNOSIS — R946 Abnormal results of thyroid function studies: Secondary | ICD-10-CM | POA: Diagnosis present

## 2013-05-12 DIAGNOSIS — N10 Acute tubulo-interstitial nephritis: Principal | ICD-10-CM | POA: Diagnosis present

## 2013-05-12 DIAGNOSIS — R5383 Other fatigue: Secondary | ICD-10-CM

## 2013-05-12 DIAGNOSIS — F312 Bipolar disorder, current episode manic severe with psychotic features: Secondary | ICD-10-CM

## 2013-05-12 DIAGNOSIS — F1210 Cannabis abuse, uncomplicated: Secondary | ICD-10-CM | POA: Diagnosis present

## 2013-05-12 DIAGNOSIS — N12 Tubulo-interstitial nephritis, not specified as acute or chronic: Secondary | ICD-10-CM | POA: Insufficient documentation

## 2013-05-12 DIAGNOSIS — D649 Anemia, unspecified: Secondary | ICD-10-CM | POA: Diagnosis present

## 2013-05-12 DIAGNOSIS — N915 Oligomenorrhea, unspecified: Secondary | ICD-10-CM

## 2013-05-12 DIAGNOSIS — D72829 Elevated white blood cell count, unspecified: Secondary | ICD-10-CM | POA: Diagnosis present

## 2013-05-12 DIAGNOSIS — R5381 Other malaise: Secondary | ICD-10-CM | POA: Diagnosis present

## 2013-05-12 DIAGNOSIS — R112 Nausea with vomiting, unspecified: Secondary | ICD-10-CM | POA: Diagnosis present

## 2013-05-12 DIAGNOSIS — F17200 Nicotine dependence, unspecified, uncomplicated: Secondary | ICD-10-CM | POA: Diagnosis present

## 2013-05-12 DIAGNOSIS — I951 Orthostatic hypotension: Secondary | ICD-10-CM

## 2013-05-12 DIAGNOSIS — E876 Hypokalemia: Secondary | ICD-10-CM | POA: Diagnosis present

## 2013-05-12 LAB — CBC WITH DIFFERENTIAL/PLATELET
Basophils Absolute: 0 10*3/uL (ref 0.0–0.1)
Basophils Absolute: 0 10*3/uL (ref 0.0–0.1)
Basophils Relative: 0 % (ref 0–1)
Basophils Relative: 0 % (ref 0–1)
EOS ABS: 0.2 10*3/uL (ref 0.0–0.7)
EOS PCT: 1 % (ref 0–5)
Eosinophils Absolute: 0.1 10*3/uL (ref 0.0–0.7)
Eosinophils Relative: 2 % (ref 0–5)
HCT: 36 % (ref 36.0–46.0)
HEMATOCRIT: 34.2 % — AB (ref 36.0–46.0)
HEMOGLOBIN: 11.9 g/dL — AB (ref 12.0–15.0)
Hemoglobin: 12.5 g/dL (ref 12.0–15.0)
LYMPHS ABS: 1.3 10*3/uL (ref 0.7–4.0)
LYMPHS ABS: 2 10*3/uL (ref 0.7–4.0)
LYMPHS PCT: 8 % — AB (ref 12–46)
LYMPHS PCT: 20 % (ref 12–46)
MCH: 32.2 pg (ref 26.0–34.0)
MCH: 32.6 pg (ref 26.0–34.0)
MCHC: 34.7 g/dL (ref 30.0–36.0)
MCHC: 34.8 g/dL (ref 30.0–36.0)
MCV: 92.8 fL (ref 78.0–100.0)
MCV: 93.7 fL (ref 78.0–100.0)
MONO ABS: 1.2 10*3/uL — AB (ref 0.1–1.0)
MONOS PCT: 8 % (ref 3–12)
Monocytes Absolute: 0.8 10*3/uL (ref 0.1–1.0)
Monocytes Relative: 8 % (ref 3–12)
Neutro Abs: 13.6 10*3/uL — ABNORMAL HIGH (ref 1.7–7.7)
Neutro Abs: 7.1 10*3/uL (ref 1.7–7.7)
Neutrophils Relative %: 84 % — ABNORMAL HIGH (ref 43–77)
Neutrophils Relative %: 70 % (ref 43–77)
Platelets: 285 10*3/uL (ref 150–400)
Platelets: 299 10*3/uL (ref 150–400)
RBC: 3.88 MIL/uL (ref 3.87–5.11)
RBC: 3.65 MIL/uL — AB (ref 3.87–5.11)
RDW: 12.7 % (ref 11.5–15.5)
RDW: 12.6 % (ref 11.5–15.5)
WBC: 16.2 10*3/uL — AB (ref 4.0–10.5)
WBC: 10.1 10*3/uL (ref 4.0–10.5)

## 2013-05-12 LAB — COMPREHENSIVE METABOLIC PANEL
ALT: 11 U/L (ref 0–35)
AST: 15 U/L (ref 0–37)
Albumin: 3.5 g/dL (ref 3.5–5.2)
Alkaline Phosphatase: 60 U/L (ref 39–117)
BUN: 8 mg/dL (ref 6–23)
CO2: 24 meq/L (ref 19–32)
CREATININE: 0.99 mg/dL (ref 0.50–1.10)
Calcium: 9.2 mg/dL (ref 8.4–10.5)
Chloride: 107 mEq/L (ref 96–112)
GFR calc Af Amer: 88 mL/min — ABNORMAL LOW (ref >90)
GFR calc non Af Amer: 76 mL/min — ABNORMAL LOW (ref >90)
Glucose, Bld: 106 mg/dL — ABNORMAL HIGH (ref 70–99)
Potassium: 3.9 mEq/L (ref 3.7–5.3)
Sodium: 143 mEq/L (ref 137–147)
Total Bilirubin: 0.4 mg/dL (ref 0.3–1.2)
Total Protein: 6.8 g/dL (ref 6.0–8.3)

## 2013-05-12 LAB — RAPID URINE DRUG SCREEN, HOSP PERFORMED
AMPHETAMINES: NOT DETECTED
Amphetamines: NOT DETECTED
BARBITURATES: NOT DETECTED
Barbiturates: NOT DETECTED
Benzodiazepines: NOT DETECTED
Benzodiazepines: NOT DETECTED
Cocaine: NOT DETECTED
Cocaine: NOT DETECTED
Opiates: NOT DETECTED
Opiates: NOT DETECTED
Tetrahydrocannabinol: POSITIVE — AB
Tetrahydrocannabinol: POSITIVE — AB

## 2013-05-12 LAB — URINALYSIS, ROUTINE W REFLEX MICROSCOPIC
GLUCOSE, UA: NEGATIVE mg/dL
Ketones, ur: NEGATIVE mg/dL
Nitrite: POSITIVE — AB
Specific Gravity, Urine: >1.03 — ABNORMAL HIGH (ref 1.005–1.030)
Urobilinogen, UA: 2 mg/dL — ABNORMAL HIGH (ref 0.0–1.0)
pH: 6.5 (ref 5.0–8.0)

## 2013-05-12 LAB — CREATININE, SERUM
Creatinine, Ser: 0.98 mg/dL (ref 0.50–1.10)
GFR calc Af Amer: 89 mL/min — ABNORMAL LOW
GFR calc non Af Amer: 77 mL/min — ABNORMAL LOW

## 2013-05-12 LAB — I-STAT CHEM 8, ED
BUN: 6 mg/dL (ref 6–23)
CHLORIDE: 105 meq/L (ref 96–112)
CREATININE: 1.1 mg/dL (ref 0.50–1.10)
Calcium, Ion: 1.2 mmol/L (ref 1.12–1.23)
Glucose, Bld: 105 mg/dL — ABNORMAL HIGH (ref 70–99)
HCT: 37 % (ref 36.0–46.0)
Hemoglobin: 12.6 g/dL (ref 12.0–15.0)
POTASSIUM: 3.1 meq/L — AB (ref 3.7–5.3)
Sodium: 145 mEq/L (ref 137–147)
TCO2: 24 mmol/L (ref 0–100)

## 2013-05-12 LAB — URINE MICROSCOPIC-ADD ON

## 2013-05-12 MED ORDER — ACETAMINOPHEN 650 MG RE SUPP: 650.0000 mg | Freq: Four times a day (QID) | RECTAL | Status: DC | PRN

## 2013-05-12 MED ORDER — POTASSIUM CHLORIDE IN NACL 40-0.9 MEQ/L-% IV SOLN
INTRAVENOUS | Status: DC
  Administered 2013-05-12: 21:00:00 via INTRAVENOUS
  Filled 2013-05-12 (×2): qty 1000

## 2013-05-12 MED ORDER — ONDANSETRON HCL 4 MG/2ML IJ SOLN
4.0000 mg | Freq: Once | INTRAMUSCULAR | Status: AC
  Administered 2013-05-12: 4 mg via INTRAVENOUS
  Filled 2013-05-12: qty 2

## 2013-05-12 MED ORDER — DEXTROSE 5 % IV SOLN
1.0000 g | INTRAVENOUS | Status: DC
  Administered 2013-05-13: 1 g via INTRAVENOUS
  Filled 2013-05-12: qty 10

## 2013-05-12 MED ORDER — ONDANSETRON HCL 4 MG PO TABS: 4.0000 mg | ORAL_TABLET | Freq: Four times a day (QID) | ORAL | Status: DC | PRN

## 2013-05-12 MED ORDER — OXYCODONE-ACETAMINOPHEN 5-325 MG PO TABS
2.0000 | ORAL_TABLET | Freq: Once | ORAL | Status: AC
  Administered 2013-05-12: 2 via ORAL
  Filled 2013-05-12: qty 2

## 2013-05-12 MED ORDER — PRENATAL MULTIVITAMIN CH
1.0000 | ORAL_TABLET | Freq: Every day | ORAL | Status: DC
  Administered 2013-05-12 – 2013-05-13 (×2): 1 via ORAL
  Filled 2013-05-12 (×2): qty 1

## 2013-05-12 MED ORDER — ONDANSETRON HCL 4 MG/2ML IJ SOLN: 4.0000 mg | Freq: Four times a day (QID) | INTRAMUSCULAR | Status: DC | PRN

## 2013-05-12 MED ORDER — ACETAMINOPHEN 325 MG PO TABS: 650.0000 mg | ORAL_TABLET | Freq: Four times a day (QID) | ORAL | Status: DC | PRN

## 2013-05-12 MED ORDER — SODIUM CHLORIDE 0.9 % IV BOLUS (SEPSIS)
1000.0000 mL | Freq: Once | INTRAVENOUS | Status: AC
Start: 2013-05-12 — End: 2013-05-12
  Administered 2013-05-12: 1000 mL via INTRAVENOUS

## 2013-05-12 MED ORDER — ENOXAPARIN SODIUM 40 MG/0.4ML ~~LOC~~ SOLN
40.0000 mg | Freq: Every day | SUBCUTANEOUS | Status: DC
  Administered 2013-05-12: 40 mg via SUBCUTANEOUS
  Filled 2013-05-12 (×2): qty 0.4

## 2013-05-12 MED ORDER — SODIUM CHLORIDE 0.9 % IV SOLN
Freq: Once | INTRAVENOUS | Status: AC
  Administered 2013-05-12: 05:00:00 via INTRAVENOUS

## 2013-05-12 MED ORDER — DEXTROSE 5 % IV SOLN
1.0000 g | Freq: Once | INTRAVENOUS | Status: AC
  Administered 2013-05-12: 1 g via INTRAVENOUS
  Filled 2013-05-12: qty 10

## 2013-05-12 MED ORDER — ONDANSETRON 8 MG PO TBDP: 8.0000 mg | ORAL_TABLET | Freq: Once | ORAL | Status: DC

## 2013-05-12 NOTE — ED Notes (Signed)
Attempted to call report on pt RN was unavailable at the time will return phone call.

## 2013-05-12 NOTE — ED Provider Notes (Signed)
CSN: [id number]     Arrival date & time 05/12/13  1604 History   First MD Initiated Contact with Patient 05/12/13 1646     Chief Complaint  Patient presents with  . Flank Pain  . Abdominal Pain     (Consider location/radiation/quality/duration/timing/severity/associated sxs/prior Treatment) HPI Patient reports she started having recurrent urinary tract infections in January and she would go to the ER and get a IV injection and then sent home on Keflex. She reports she would not get better. She relates she was seen at [facility] hospital and had a urine culture done. She was given a prescription for Cipro which she did not get filled. She was seen there much earlier this morning and was diagnosed with bilateral pyelonephritis. She was sent to [facility] long ED to be admitted. 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 She reports she has had nausea without vomiting today since she left the hospital. She is not aware of fever or chills. She states however today whenever she stands up she blacks out his passed out several times today. She denies any injury from that. She denies any diarrhea. She states she was able to eat some macaroni and cheese and rested chicken today. She does have dysuria and frequency. She states she's never had pyelonephritis before. 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  PCP none  Past Medical History  Diagnosis Date  . Tumor of ovary   . Headache(784.0)   . Boils   . Polycystic ovarian syndrome   . Mental disorder   . Anxiety    Past Surgical History  Procedure Laterality Date  . Tumor removal     No family history on file. History  Substance Use Topics  . Smoking status: Current Every Day Smoker -- 0.25 packs/day    Types: Cigarettes  . Smokeless tobacco: Not on file  . Alcohol Use: Yes     Comment: SMOKED MARIJUANA- JULY,  COCAINE-  NONE  THIS YEAR.      SMOKED LAST  AT 0030.   employed   OB History   Grav Para Term Preterm Abortions TAB SAB Ect Mult Living   0              Review of Systems  All other systems reviewed and are negative.      Allergies  Ibuprofen  Home Medications   Patient's Medications  New Prescriptions   No medications on file  Previous Medications   BIOTIN PO    Take 1 tablet by mouth daily.   FIBER SELECT GUMMIES PO    Take 2 tablets by mouth daily.   PEDIATRIC MULTIVIT-MINERALS-C (RA GUMMY VITAMINS & MINERALS) CHEW    Chew 2 tablets by mouth daily.   PRENATAL VIT-FE FUMARATE-FA (PRENATAL MULTIVITAMIN) TABS    Take 1 tablet by mouth daily.  Modified Medications   No medications on file  Discontinued Medications   No medications on file   BP 127/88  Pulse 84  Temp(Src) 98 F (36.7 C) (Oral)  Resp 18  SpO2 98%  LMP 05/05/2013  Vital signs normal   Physical Exam  Nursing note and vitals reviewed. Constitutional: She is oriented to person, place, and time. She appears well-developed and well-nourished.  Non-toxic appearance. She does not appear ill. No distress.  HENT:  Head: Normocephalic and atraumatic.  Right Ear: External ear normal.  Left Ear: External ear normal.  Nose: Nose normal. No mucosal edema or rhinorrhea.  Mouth/Throat: Oropharynx is clear and moist and mucous membranes are normal. No dental abscesses or uvula swelling.  Eyes: Conjunctivae and EOM are normal. Pupils are equal, round, and reactive to light.  Neck: Normal range of motion and full passive range of motion without pain. Neck supple.  Cardiovascular: Normal rate, regular rhythm and normal heart sounds.  Exam reveals no gallop and no friction rub.   No murmur heard. Pulmonary/Chest: Effort normal and breath sounds normal. No respiratory distress. She has no wheezes. She has no rhonchi. She has no rales. She exhibits no tenderness and no crepitus.  Abdominal: Soft. Normal appearance and bowel sounds  are normal. She exhibits no distension. There is no tenderness. There is no rebound and no guarding.  Musculoskeletal: Normal range of motion. She exhibits no edema and no tenderness.  Moves all extremities well.   Neurological: She is alert and oriented to person, place, and time. She has normal strength. No cranial nerve deficit.  Skin: Skin is warm, dry and intact. No rash noted. No erythema. No pallor.  Psychiatric: She has a normal mood and affect. Her speech is normal and behavior is normal. Her mood appears not anxious.    ED Course  Procedures (including critical care time)  Medications  0.9 % NaCl with KCl 40 mEq / L  infusion (not administered)  sodium chloride 0.9 % bolus 1,000 mL (0 mLs Intravenous Stopped 05/12/13 1826)    18:53 Dr Ree Kida will admit   Labs Review Results for orders placed during the hospital encounter of 05/12/13  CBC WITH DIFFERENTIAL      Result Value Ref Range   WBC 10.1  4.0 - 10.5 K/uL   RBC 3.65 (*) 3.87 - 5.11 MIL/uL   Hemoglobin 11.9 (*) 12.0 - 15.0 g/dL   HCT 34.2 (*) 36.0 - 46.0 %   MCV 93.7  78.0 - 100.0 fL   MCH 32.6  26.0 - 34.0 pg   MCHC 34.8  30.0 - 36.0 g/dL   RDW 12.6  11.5 - 15.5 %   Platelets 299  150 - 400 K/uL   Neutrophils Relative % 70  43 - 77 %   Neutro Abs 7.1  1.7 - 7.7 K/uL   Lymphocytes Relative 20  12 - 46 %   Lymphs Abs 2.0  0.7 - 4.0 K/uL   Monocytes Relative 8  3 - 12 %   Monocytes Absolute 0.8  0.1 - 1.0 K/uL   Eosinophils Relative 2  0 - 5 %   Eosinophils Absolute 0.2  0.0 - 0.7 K/uL   Basophils Relative 0  0 - 1 %   Basophils Absolute 0.0  0.0 - 0.1 K/uL  URINE RAPID DRUG SCREEN (HOSP PERFORMED)      Result Value Ref Range   Opiates NONE DETECTED  NONE DETECTED   Cocaine NONE DETECTED  NONE DETECTED   Benzodiazepines NONE DETECTED  NONE DETECTED   Amphetamines NONE DETECTED  NONE DETECTED   Tetrahydrocannabinol POSITIVE (*) NONE DETECTED   Barbiturates NONE DETECTED  NONE DETECTED  I-STAT CHEM 8, ED       Result Value Ref Range   Sodium 145  137 - 147 mEq/L   Potassium 3.1 (*) 3.7 - 5.3 mEq/L   Chloride 105  96 - 112 mEq/L   BUN 6  6 - 23 mg/dL   Creatinine, Ser 1.10  0.50 - 1.10 mg/dL  Glucose, Bld 105 (*) 70 - 99 mg/dL   Calcium, Ion 1.20  1.12 - 1.23 mmol/L   TCO2 24  0 - 100 mmol/L   Hemoglobin 12.6  12.0 - 15.0 g/dL   HCT 37.0  36.0 - 46.0 %   Laboratory interpretation all normal except hypokalemia, mild anemia   Imaging Review US Transvaginal Non-ob  US Pelvis Complete  05/12/2013   CLINICAL DATA Pelvic pain, cervical motion tenderness, low grade fever.  EXAM TRANSABDOMINAL AND TRANSVAGINAL ULTRASOUND OF PELVIS  TECHNIQUE Both transabdominal and transvaginal ultrasound examinations of the pelvis were performed. Transabdominal technique was performed for global imaging of the pelvis including uterus, ovaries, adnexal regions, and pelvic cul-de-sac. It was necessary to proceed with endovaginal exam following the transabdominal exam to visualize the endometrium and adnexa.  COMPARISON 02/16/2013 CT, 10/27/2011 ultrasound  FINDINGS Uterus  Measurements: 7.9 x 4.2 x 4.7 cm. No fibroids or other mass visualized.  Endometrium  Thickness: 6 mm.  No focal abnormality visualized.  Right ovary  Measurements: 6.8 x 3.0 x 2.2 cm. Elongated/ovoid configuration. Multiple small cysts.  Left ovary  Measurements: 4.1 x 2.6 x 2.3 cm. Mildly enlarged and elongated in configuration with multiple small cysts.  Other findings  Small amount of free fluid  IMPRESSION Enlarged appearance to the ovaries, similar to prior, may reflect polycystic ovarian syndrome.  Small amount of free fluid.  SIGNATURE  Electronically Signed   By: Carlos Levering M.D.   On: 05/12/2013 06:48   US Renal  05/12/2013   CLINICAL DATA UTI, hematuria.  EXAM RENAL/URINARY TRACT ULTRASOUND COMPLETE  COMPARISON 02/16/2013 CT  FINDINGS Right Kidney:  Length: 11.3 cm. Echogenicity within normal limits. No mass or hydronephrosis visualized.   Left Kidney:  Length: 10.7 cm. Echogenicity within normal limits. No mass or hydronephrosis visualized.  Bladder:  Incompletely distended, limiting evaluation. There is mild wall thickening.  IMPRESSION Normal sonographic appearance to the kidneys.  No hydronephrosis.  Mild bladder wall thickening is nonspecific given incomplete distention. Correlate clinically and with urinalysis for cystitis.  SIGNATURE  Electronically Signed   By: Carlos Levering M.D.   On: 05/12/2013 06:51     EKG Interpretation None      MDM   Final diagnoses:  Pyelonephritis  Orthostatic syncope    Plan admission  Rolland Porter, MD, Alanson Aly, MD 05/12/13 2111

## 2013-05-12 NOTE — ED Notes (Signed)
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

## 2013-05-12 NOTE — MAU Note (Signed)
Pt reports pain in lower abd worse on left side, states she kept "blacking out" at work.

## 2013-05-12 NOTE — Progress Notes (Signed)
ANTIBIOTIC CONSULT NOTE - INITIAL  Pharmacy Consult for Rocephin Indication: UTI/Pyelonephritis  Allergies  Allergen Reactions  . Ibuprofen Other (See Comments)    Stomach upset    Patient Measurements:   Wt=88 kg  Vital Signs: Temp: 98 F (36.7 C) (03/12 1928) Temp src: Oral (03/12 1928) BP: 127/88 mmHg (03/12 1928) Pulse Rate: 84 (03/12 1928) Intake/Output from previous day:   Intake/Output from this shift:    Labs:  Recent Labs  05/12/13 0244 05/12/13 1713 05/12/13 1720 05/12/13 1736  WBC 16.2*  --  10.1  --   HGB 12.5  --  11.9* 12.6  PLT 285  --  299  --   CREATININE 0.99 0.98  --  1.10   The CrCl is unknown because both a height and weight (above a minimum accepted value) are required for this calculation. No results found for this basename: VANCOTROUGH, VANCOPEAK, VANCORANDOM, GENTTROUGH, GENTPEAK, GENTRANDOM, TOBRATROUGH, TOBRAPEAK, TOBRARND, AMIKACINPEAK, AMIKACINTROU, AMIKACIN,  in the last 72 hours   Microbiology: Recent Results (from the past 720 hour(s))  WET PREP, GENITAL     Status: Abnormal   Collection Time    05/04/13  2:00 AM      Result Value Ref Range Status   Yeast Wet Prep HPF POC NONE SEEN  NONE SEEN Final   Trich, Wet Prep NONE SEEN  NONE SEEN Final   Clue Cells Wet Prep HPF POC MODERATE (*) NONE SEEN Final   WBC, Wet Prep HPF POC FEW (*) NONE SEEN Final   Comment: MODERATE BACTERIA SEEN  GC/CHLAMYDIA PROBE AMP     Status: None   Collection Time    05/04/13  2:00 AM      Result Value Ref Range Status   CT Probe RNA NEGATIVE  NEGATIVE Final   GC Probe RNA NEGATIVE  NEGATIVE Final   Comment: (NOTE)                                                                                               **Normal Reference Range: Negative**          Assay performed using the Gen-Probe APTIMA COMBO2 (R) Assay.     Acceptable specimen types for this assay include APTIMA Swabs (Unisex,     endocervical, urethral, or vaginal), first void urine, and  ThinPrep     liquid based cytology samples.     Performed at [facility] History: Past Medical History  Diagnosis Date  . Tumor of ovary   . Headache(784.0)   . Boils   . Polycystic ovarian syndrome   . Mental disorder   . Anxiety     Medications:  Scheduled:  . [START ON 05/13/2013] cefTRIAXone (ROCEPHIN)  IV  1 g Intravenous Q24H  . enoxaparin (LOVENOX) injection  40 mg Subcutaneous QHS  . prenatal multivitamin  1 tablet Oral Daily   Infusions:  . 0.9 % NaCl with KCl 40 mEq / L 75 mL/hr at 05/12/13 2123   Assessment: 28 yo c/o abdominal pain, n/v.  MD ordering Rocephin for UTI, pyelonephritis.  Goal of Therapy:  Treat  infection  Plan:   Rocephin 1Gm IV q24h  F/U cultures as needed  Dorrene German 05/12/2013,10:39 PM

## 2013-05-12 NOTE — Progress Notes (Signed)
Pt still not sure why she needs to go to [facility] but will go . (not sure if she will stay for full treatment). Explained necessity to tay for full treatment. Pt worried about Job. Social service consult ordered by MD

## 2013-05-12 NOTE — H&P (Signed)
[facility] History and Physical  [name] LJQ:[id number] DOB: 23-Jun-1985 DOA: 05/12/2013  PCP: Patient doesn't have a primary care physician. Specialists: None  Chief Complaint: Almost passed out with Nausea, vomiting, and abdominal pain  The patient left against medical advice.  HPI: [name] [name] is a 28 y.o. female with a past medical history of followup polycystic ovarian syndrome, frequent UTIs, who was in her usual state of health several sometime last week when patient started having weakness and lightheadedness. She had passing out episode. She was seen in the emergency department on March 5. She was given IV fluids, and was subsequently discharged home. She has been diagnosed with UTIs recently and has been on Keflex. Few days ago she was diagnosed again with a UTI and was given a prescription for ciprofloxacin. However, patient hasn't filled that prescription yet. Last night while she was at work she felt extremely lightheaded and felt like she was going to pass out but did not have a syncopal episode. She was having abdominal pain diffusely, especially in the left flank area. She also had nausea and vomiting. So, [facility] She underwent workup there, which revealed an abnormal, UA. Pyelonephritis was diagnosed. There was no other GYN issue. She was subsequently transferred to [facility] long for further management.  Home Medications: Prior to Admission medications   Medication Sig Start Date End Date Taking? Authorizing Provider  BIOTIN PO Take 1 tablet by mouth daily.   Yes Historical Provider, MD  FIBER SELECT GUMMIES PO Take 2 tablets by mouth daily.   Yes Historical Provider, MD  Pediatric Multivit-Minerals-C (RA GUMMY VITAMINS & MINERALS) CHEW Chew 2 tablets by mouth daily.   Yes Historical Provider, MD  Prenatal Vit-Fe Fumarate-FA (PRENATAL MULTIVITAMIN) TABS Take 1 tablet by mouth daily.   Yes Historical Provider, MD     Allergies:  Allergies  Allergen Reactions  . Ibuprofen Other (See Comments)    Stomach upset    Past Medical History: Past Medical History  Diagnosis Date  . Tumor of ovary   . Headache(784.0)   . Boils   . Polycystic ovarian syndrome   . Mental disorder   . Anxiety     Past Surgical History  Procedure Laterality Date  . Tumor removal      Social History: Patient lives in an apartment. She smokes cigarettes occasionally, but she smokes marijuana on a daily basis. Cocaine on a monthly basis. Denies any IV drug use. No alcohol abuse. Independent with daily activities.  Family History: Cancer is present in family but patient is unable to specify.   Review of Systems - History obtained from the patient General ROS: negative Psychological ROS: negative Ophthalmic ROS: negative ENT ROS: negative Allergy and Immunology ROS: negative Hematological and Lymphatic ROS: negative Endocrine ROS: negative Respiratory ROS: no cough, shortness of breath, or wheezing Cardiovascular ROS: no chest pain or dyspnea on exertion Gastrointestinal ROS: as in hpi Genito-Urinary ROS: no dysuria, trouble voiding, or hematuria Musculoskeletal ROS: negative Neurological ROS: no TIA or stroke symptoms Dermatological ROS: negative  Physical Examination  Filed Vitals:   05/12/13 0111 05/12/13 0740 05/12/13 0955  BP: 107/56  110/60  Pulse: 84  88  Temp: 99.2 F (37.3 C) 98.3 F (36.8 C) 98.3 F (36.8 C)  TempSrc: Oral Oral   Resp: 20  18  Height: 5' 11"  (1.803 m)    Weight: 87.998 kg (194 lb)    SpO2: 97%  BP 110/60  Pulse 88  Temp(Src) 98.3 F (36.8 C) (Oral)  Resp 18  Ht 5' 11"  (1.803 m)  Wt 87.998 kg (194 lb)  BMI 27.07 kg/m2  SpO2 97%  LMP 05/05/2013  General appearance: alert, cooperative, appears stated age and no distress Head: Normocephalic, without obvious abnormality, atraumatic Eyes: conjunctivae/corneas clear. PERRL, EOM's intact. Throat: lips, mucosa, and  tongue normal; teeth and gums normal Neck: no adenopathy, no carotid bruit, no JVD, supple, symmetrical, trachea midline and thyroid not enlarged, symmetric, no tenderness/mass/nodules Resp: clear to auscultation bilaterally Cardio: regular rate and rhythm, S1, S2 normal, no murmur, click, rub or gallop GI: soft, non-tender; bowel sounds normal; no masses,  no organomegaly Extremities: extremities normal, atraumatic, no cyanosis or edema Pulses: 2+ and symmetric Skin: Skin color, texture, turgor normal. No rashes or lesions Neurologic: No focal deficits  Laboratory Data: Results for orders placed during the hospital encounter of 05/12/13 (from the past 48 hour(s))  URINALYSIS, ROUTINE W REFLEX MICROSCOPIC     Status: Abnormal   Collection Time    05/12/13  2:03 AM      Result Value Ref Range   Color, Urine BROWN (*) YELLOW   Comment: BIOCHEMICALS MAY BE AFFECTED BY COLOR   APPearance TURBID (*) CLEAR   Specific Gravity, Urine >1.030 (*) 1.005 - 1.030   pH 6.5  5.0 - 8.0   Glucose, UA NEGATIVE  NEGATIVE mg/dL   Hgb urine dipstick LARGE (*) NEGATIVE   Bilirubin Urine SMALL (*) NEGATIVE   Ketones, ur NEGATIVE  NEGATIVE mg/dL   Protein, ur >300 (*) NEGATIVE mg/dL   Urobilinogen, UA 2.0 (*) 0.0 - 1.0 mg/dL   Nitrite POSITIVE (*) NEGATIVE   Leukocytes, UA MODERATE (*) NEGATIVE  URINE MICROSCOPIC-ADD ON     Status: Abnormal   Collection Time    05/12/13  2:03 AM      Result Value Ref Range   Squamous Epithelial / LPF FEW (*) RARE   WBC, UA 3-6  <3 WBC/hpf   RBC / HPF TOO NUMEROUS TO COUNT  <3 RBC/hpf   Bacteria, UA FEW (*) RARE  URINE RAPID DRUG SCREEN (HOSP PERFORMED)     Status: Abnormal   Collection Time    05/12/13  2:03 AM      Result Value Ref Range   Opiates NONE DETECTED  NONE DETECTED   Cocaine NONE DETECTED  NONE DETECTED   Benzodiazepines NONE DETECTED  NONE DETECTED   Amphetamines NONE DETECTED  NONE DETECTED   Tetrahydrocannabinol POSITIVE (*) NONE DETECTED    Barbiturates NONE DETECTED  NONE DETECTED   Comment:            DRUG SCREEN FOR MEDICAL PURPOSES     ONLY.  IF CONFIRMATION IS NEEDED     FOR ANY PURPOSE, NOTIFY LAB     WITHIN 5 DAYS.                LOWEST DETECTABLE LIMITS     FOR URINE DRUG SCREEN     Drug Class       Cutoff (ng/mL)     Amphetamine      1000     Barbiturate      200     Benzodiazepine   401     Tricyclics       027     Opiates          300     Cocaine  300     THC              50     Performed at [facility]  CBC WITH DIFFERENTIAL     Status: Abnormal   Collection Time    05/12/13  2:44 AM      Result Value Ref Range   WBC 16.2 (*) 4.0 - 10.5 K/uL   RBC 3.88  3.87 - 5.11 MIL/uL   Hemoglobin 12.5  12.0 - 15.0 g/dL   HCT 36.0  36.0 - 46.0 %   MCV 92.8  78.0 - 100.0 fL   MCH 32.2  26.0 - 34.0 pg   MCHC 34.7  30.0 - 36.0 g/dL   RDW 12.7  11.5 - 15.5 %   Platelets 285  150 - 400 K/uL   Neutrophils Relative % 84 (*) 43 - 77 %   Neutro Abs 13.6 (*) 1.7 - 7.7 K/uL   Lymphocytes Relative 8 (*) 12 - 46 %   Lymphs Abs 1.3  0.7 - 4.0 K/uL   Monocytes Relative 8  3 - 12 %   Monocytes Absolute 1.2 (*) 0.1 - 1.0 K/uL   Eosinophils Relative 1  0 - 5 %   Eosinophils Absolute 0.1  0.0 - 0.7 K/uL   Basophils Relative 0  0 - 1 %   Basophils Absolute 0.0  0.0 - 0.1 K/uL  COMPREHENSIVE METABOLIC PANEL     Status: Abnormal   Collection Time    05/12/13  2:44 AM      Result Value Ref Range   Sodium 143  137 - 147 mEq/L   Potassium 3.9  3.7 - 5.3 mEq/L   Chloride 107  96 - 112 mEq/L   CO2 24  19 - 32 mEq/L   Glucose, Bld 106 (*) 70 - 99 mg/dL   BUN 8  6 - 23 mg/dL   Creatinine, Ser 0.99  0.50 - 1.10 mg/dL   Calcium 9.2  8.4 - 10.5 mg/dL   Total Protein 6.8  6.0 - 8.3 g/dL   Albumin 3.5  3.5 - 5.2 g/dL   AST 15  0 - 37 U/L   ALT 11  0 - 35 U/L   Alkaline Phosphatase 60  39 - 117 U/L   Total Bilirubin 0.4  0.3 - 1.2 mg/dL   GFR calc non Af Amer 76 (*) >90 mL/min   GFR calc Af Amer 88 (*) >90 mL/min    Comment: (NOTE)     The eGFR has been calculated using the CKD EPI equation.     This calculation has not been validated in all clinical situations.     eGFR's persistently <90 mL/min signify possible Chronic Kidney     Disease.    Radiology Reports: US Transvaginal Non-ob  05/12/2013   CLINICAL DATA Pelvic pain, cervical motion tenderness, low grade fever.  EXAM TRANSABDOMINAL AND TRANSVAGINAL ULTRASOUND OF PELVIS  TECHNIQUE Both transabdominal and transvaginal ultrasound examinations of the pelvis were performed. Transabdominal technique was performed for global imaging of the pelvis including uterus, ovaries, adnexal regions, and pelvic cul-de-sac. It was necessary to proceed with endovaginal exam following the transabdominal exam to visualize the endometrium and adnexa.  COMPARISON 02/16/2013 CT, 10/27/2011 ultrasound  FINDINGS Uterus  Measurements: 7.9 x 4.2 x 4.7 cm. No fibroids or other mass visualized.  Endometrium  Thickness: 6 mm.  No focal abnormality visualized.  Right ovary  Measurements: 6.8 x 3.0 x 2.2 cm. Elongated/ovoid configuration. Multiple  small cysts.  Left ovary  Measurements: 4.1 x 2.6 x 2.3 cm. Mildly enlarged and elongated in configuration with multiple small cysts.  Other findings  Small amount of free fluid  IMPRESSION Enlarged appearance to the ovaries, similar to prior, may reflect polycystic ovarian syndrome.  Small amount of free fluid.  SIGNATURE  Electronically Signed   By: Carlos Levering M.D.   On: 05/12/2013 06:48   US Pelvis Complete  05/12/2013   CLINICAL DATA Pelvic pain, cervical motion tenderness, low grade fever.  EXAM TRANSABDOMINAL AND TRANSVAGINAL ULTRASOUND OF PELVIS  TECHNIQUE Both transabdominal and transvaginal ultrasound examinations of the pelvis were performed. Transabdominal technique was performed for global imaging of the pelvis including uterus, ovaries, adnexal regions, and pelvic cul-de-sac. It was necessary to proceed with endovaginal exam  following the transabdominal exam to visualize the endometrium and adnexa.  COMPARISON 02/16/2013 CT, 10/27/2011 ultrasound  FINDINGS Uterus  Measurements: 7.9 x 4.2 x 4.7 cm. No fibroids or other mass visualized.  Endometrium  Thickness: 6 mm.  No focal abnormality visualized.  Right ovary  Measurements: 6.8 x 3.0 x 2.2 cm. Elongated/ovoid configuration. Multiple small cysts.  Left ovary  Measurements: 4.1 x 2.6 x 2.3 cm. Mildly enlarged and elongated in configuration with multiple small cysts.  Other findings  Small amount of free fluid  IMPRESSION Enlarged appearance to the ovaries, similar to prior, may reflect polycystic ovarian syndrome.  Small amount of free fluid.  SIGNATURE  Electronically Signed   By: Carlos Levering M.D.   On: 05/12/2013 06:48   US Renal  05/12/2013   CLINICAL DATA UTI, hematuria.  EXAM RENAL/URINARY TRACT ULTRASOUND COMPLETE  COMPARISON 02/16/2013 CT  FINDINGS Right Kidney:  Length: 11.3 cm. Echogenicity within normal limits. No mass or hydronephrosis visualized.  Left Kidney:  Length: 10.7 cm. Echogenicity within normal limits. No mass or hydronephrosis visualized.  Bladder:  Incompletely distended, limiting evaluation. There is mild wall thickening.  IMPRESSION Normal sonographic appearance to the kidneys.  No hydronephrosis.  Mild bladder wall thickening is nonspecific given incomplete distention. Correlate clinically and with urinalysis for cystitis.  SIGNATURE  Electronically Signed   By: Carlos Levering M.D.   On: 05/12/2013 06:51    Electrocardiogram: None  Problem List  Principal Problem:   PCOS (polycystic ovarian syndrome) Active Problems:   Bipolar I disorder with depression   Cannabis abuse   Acute pyelonephritis   Assessment: This is a 28 year old, African American female, who presented with the nausea, vomiting, abdominal pain, and was diagnosed with acute pyelonephritis. Her symptoms seem to be somewhat better. She also had a near syncopal episode,  which is most likely due to acute infection and dehydration.  Plan: #1 acute pyelonephritis. Patient was placed on ceftriaxone. Urine cultures were obtained and are pending. Plan was to continue with ceftriaxone for now.  #2 near syncope: This is the second time she's had these kind of symptoms. Most likely this is due to her acute infection. However, arrythmia cannot be ruled out. Echocardiogram was to be ordered. She was to be monitored on telemetry. EKG would have been obtained.  However, when I walked in to see the patient she mentioned that she had to leave. She has some ongoing issues with her landlord. She's afraid that if she stays in the hospital today she will lose her apartment and become homeless. I offered her services of social worker to help with this issue. However, the patient refused. She wanted to be discharged. I explained to her  that considering her acute illness it would be hard for me to discharge her at this time. We'll need to wait for urine cultures and will need to do further evaluation for her near syncope. However, patient got belligerent. She demanded that she wanted to go [place]. She already had a prescription for ciprofloxacin at home, which I recommended her to fill and take.  She was told that if she felt sick again or if she changes her mind she can return to the hospital through the emergency department.  The patient left against medical advice.  [facility]  [facility] Pager [phone]  If 7PM-7AM, please contact night-coverage [email or web address] Password [email or web address]  05/12/2013, 1:48 PM

## 2013-05-12 NOTE — MAU Provider Note (Signed)
Chief Complaint: No chief complaint on file.   First Provider Initiated Contact with Patient 05/12/13 0351     SUBJECTIVE HPI: [name] [name] is a 28 y.o. female who presents by EMS with left-sided abdominal pain, worsening urinary urgency and frequency and nausea x2 days. Vomited once. Chills today. Diagnosed with 3 UTIs in the past 3 months. Treated with Keflex twice without complete resolution of symptoms. States she completed a course of antibiotics. Diagnosed with third UTI 05/04/2013. Was prescribed Cipro but did not fill prescription for financial reasons. Urine culture was ordered, but never performed.  Was seen in the emergency department 05/12/2013 for syncopal episodes. Creatinine noted to be slightly elevated at 1.11.   Past Medical History  Diagnosis Date  . Tumor of ovary   . Headache(784.0)   . Boils   . Polycystic ovarian syndrome   . Bipolar disorder  Polysubstance abuse  Suicide attempt  Psychosis  Trichomonas  PCOS   . Anxiety    OB History  Gravida Para Term Preterm AB SAB TAB Ectopic Multiple Living  0                Past Surgical History  Procedure Laterality Date  . Tumor removal     History   Social History  . Marital Status: Divorced    Spouse Name: N/A    Number of Children: N/A  . Years of Education: N/A   Occupational History  . Not on file.   Social History Main Topics  . Smoking status: Current Every Day Smoker -- 0.25 packs/day    Types: Cigarettes  . Smokeless tobacco: Not on file  . Alcohol Use: Yes     Comment: SMOKED MARIJUANA- JULY,  COCAINE-  NONE THIS YEAR.      SMOKED LAST  AT 0030.  Marland Kitchen Drug Use: Yes    Special: Marijuana  . Sexual Activity: Yes    Birth Control/ Protection: None   Other Topics Concern  . Not on file   Social History Narrative  . No narrative on file   No current facility-administered medications on file prior to encounter.   Current Outpatient Prescriptions on File Prior to Encounter   Medication Sig Dispense Refill  . Prenatal Vit-Fe Fumarate-FA (PRENATAL MULTIVITAMIN) TABS Take 1 tablet by mouth daily.      . ondansetron (ZOFRAN ODT) 4 MG disintegrating tablet Take 1 tablet (4 mg total) by mouth every 8 (eight) hours as needed for nausea.  10 tablet  0   Allergies  Allergen Reactions  . Ibuprofen Other (See Comments)    Stomach upset    ROS: Pertinent items in HPI  OBJECTIVE Blood pressure 107/56, pulse 84, temperature 99.2 F (37.3 C), temperature source Oral, resp. rate 20, height 5\' 11"  (1.803 m), weight 87.998 kg (194 lb), last menstrual period 05/05/2013, SpO2 97.00%. GENERAL: Well-developed, well-nourished female in moderate distress, shaking.  HEENT: Normocephalic HEART: normal rate and rhythm. RESP: normal effort ABDOMEN: Slightly distended. Voluntary guarding. Moderate tenderness to palpation in left lower abdomen and mild TTP throughout remainder of abd. Pos left CVAT. Pos BS x 4.  EXTREMITIES: Nontender, no edema NEURO: Alert and oriented SPECULUM EXAM: deferred due to recent exam and cultures.  BIMANUAL: NEFG, cervix closed; uterus normal size, no adnexal masses. Mild CMT.   LAB RESULTS Results for orders placed during the hospital encounter of 05/12/13 (from the past 24 hour(s))  URINALYSIS, ROUTINE W REFLEX MICROSCOPIC     Status: Abnormal   Collection Time  05/12/13  2:03 AM      Result Value Ref Range   Color, Urine BROWN (*) YELLOW   APPearance TURBID (*) CLEAR   Specific Gravity, Urine >1.030 (*) 1.005 - 1.030   pH 6.5  5.0 - 8.0   Glucose, UA NEGATIVE  NEGATIVE mg/dL   Hgb urine dipstick LARGE (*) NEGATIVE   Bilirubin Urine SMALL (*) NEGATIVE   Ketones, ur NEGATIVE  NEGATIVE mg/dL   Protein, ur >300 (*) NEGATIVE mg/dL   Urobilinogen, UA 2.0 (*) 0.0 - 1.0 mg/dL   Nitrite POSITIVE (*) NEGATIVE   Leukocytes, UA MODERATE (*) NEGATIVE  URINE MICROSCOPIC-ADD ON     Status: Abnormal   Collection Time    05/12/13  2:03 AM      Result  Value Ref Range   Squamous Epithelial / LPF FEW (*) RARE   WBC, UA 3-6  <3 WBC/hpf   RBC / HPF TOO NUMEROUS TO COUNT  <3 RBC/hpf   Bacteria, UA FEW (*) RARE  URINE RAPID DRUG SCREEN (HOSP PERFORMED)     Status: Abnormal   Collection Time    05/12/13  2:03 AM      Result Value Ref Range   Opiates NONE DETECTED  NONE DETECTED   Cocaine NONE DETECTED  NONE DETECTED   Benzodiazepines NONE DETECTED  NONE DETECTED   Amphetamines NONE DETECTED  NONE DETECTED   Tetrahydrocannabinol POSITIVE (*) NONE DETECTED   Barbiturates NONE DETECTED  NONE DETECTED  CBC WITH DIFFERENTIAL     Status: Abnormal   Collection Time    05/12/13  2:44 AM      Result Value Ref Range   WBC 16.2 (*) 4.0 - 10.5 K/uL   RBC 3.88  3.87 - 5.11 MIL/uL   Hemoglobin 12.5  12.0 - 15.0 g/dL   HCT 36.0  36.0 - 46.0 %   MCV 92.8  78.0 - 100.0 fL   MCH 32.2  26.0 - 34.0 pg   MCHC 34.7  30.0 - 36.0 g/dL   RDW 12.7  11.5 - 15.5 %   Platelets 285  150 - 400 K/uL   Neutrophils Relative % 84 (*) 43 - 77 %   Neutro Abs 13.6 (*) 1.7 - 7.7 K/uL   Lymphocytes Relative 8 (*) 12 - 46 %   Lymphs Abs 1.3  0.7 - 4.0 K/uL   Monocytes Relative 8  3 - 12 %   Monocytes Absolute 1.2 (*) 0.1 - 1.0 K/uL   Eosinophils Relative 1  0 - 5 %   Eosinophils Absolute 0.1  0.0 - 0.7 K/uL   Basophils Relative 0  0 - 1 %   Basophils Absolute 0.0  0.0 - 0.1 K/uL  COMPREHENSIVE METABOLIC PANEL     Status: Abnormal   Collection Time    05/12/13  2:44 AM      Result Value Ref Range   Sodium 143  137 - 147 mEq/L   Potassium 3.9  3.7 - 5.3 mEq/L   Chloride 107  96 - 112 mEq/L   CO2 24  19 - 32 mEq/L   Glucose, Bld 106 (*) 70 - 99 mg/dL   BUN 8  6 - 23 mg/dL   Creatinine, Ser 0.99  0.50 - 1.10 mg/dL   Calcium 9.2  8.4 - 10.5 mg/dL   Total Protein 6.8  6.0 - 8.3 g/dL   Albumin 3.5  3.5 - 5.2 g/dL   AST 15  0 - 37 U/L   ALT  11  0 - 35 U/L   Alkaline Phosphatase 60  39 - 117 U/L   Total Bilirubin 0.4  0.3 - 1.2 mg/dL   GFR calc non Af Amer 76 (*)  >90 mL/min   GFR calc Af Amer 88 (*) >90 mL/min   IMAGING US Transvaginal Non-ob  05/12/2013   CLINICAL DATA Pelvic pain, cervical motion tenderness, low grade fever.  EXAM TRANSABDOMINAL AND TRANSVAGINAL ULTRASOUND OF PELVIS  TECHNIQUE Both transabdominal and transvaginal ultrasound examinations of the pelvis were performed. Transabdominal technique was performed for global imaging of the pelvis including uterus, ovaries, adnexal regions, and pelvic cul-de-sac. It was necessary to proceed with endovaginal exam following the transabdominal exam to visualize the endometrium and adnexa.  COMPARISON 02/16/2013 CT, 10/27/2011 ultrasound  FINDINGS Uterus  Measurements: 7.9 x 4.2 x 4.7 cm. No fibroids or other mass visualized.  Endometrium  Thickness: 6 mm.  No focal abnormality visualized.  Right ovary  Measurements: 6.8 x 3.0 x 2.2 cm. Elongated/ovoid configuration. Multiple small cysts.  Left ovary  Measurements: 4.1 x 2.6 x 2.3 cm. Mildly enlarged and elongated in configuration with multiple small cysts.  Other findings  Small amount of free fluid  IMPRESSION Enlarged appearance to the ovaries, similar to prior, may reflect polycystic ovarian syndrome.  Small amount of free fluid.  SIGNATURE  Electronically Signed   By: Carlos Levering M.D.   On: 05/12/2013 06:48   US Pelvis Complete  05/12/2013   CLINICAL DATA Pelvic pain, cervical motion tenderness, low grade fever.  EXAM TRANSABDOMINAL AND TRANSVAGINAL ULTRASOUND OF PELVIS  TECHNIQUE Both transabdominal and transvaginal ultrasound examinations of the pelvis were performed. Transabdominal technique was performed for global imaging of the pelvis including uterus, ovaries, adnexal regions, and pelvic cul-de-sac. It was necessary to proceed with endovaginal exam following the transabdominal exam to visualize the endometrium and adnexa.  COMPARISON 02/16/2013 CT, 10/27/2011 ultrasound  FINDINGS Uterus  Measurements: 7.9 x 4.2 x 4.7 cm. No fibroids or other mass  visualized.  Endometrium  Thickness: 6 mm.  No focal abnormality visualized.  Right ovary  Measurements: 6.8 x 3.0 x 2.2 cm. Elongated/ovoid configuration. Multiple small cysts.  Left ovary  Measurements: 4.1 x 2.6 x 2.3 cm. Mildly enlarged and elongated in configuration with multiple small cysts.  Other findings  Small amount of free fluid  IMPRESSION Enlarged appearance to the ovaries, similar to prior, may reflect polycystic ovarian syndrome.  Small amount of free fluid.  SIGNATURE  Electronically Signed   By: Carlos Levering M.D.   On: 05/12/2013 06:48   US Renal  05/12/2013   CLINICAL DATA UTI, hematuria.  EXAM RENAL/URINARY TRACT ULTRASOUND COMPLETE  COMPARISON 02/16/2013 CT  FINDINGS Right Kidney:  Length: 11.3 cm. Echogenicity within normal limits. No mass or hydronephrosis visualized.  Left Kidney:  Length: 10.7 cm. Echogenicity within normal limits. No mass or hydronephrosis visualized.  Bladder:  Incompletely distended, limiting evaluation. There is mild wall thickening.  IMPRESSION Normal sonographic appearance to the kidneys.  No hydronephrosis.  Mild bladder wall thickening is nonspecific given incomplete distention. Correlate clinically and with urinalysis for cystitis.  SIGNATURE  Electronically Signed   By: Carlos Levering M.D.   On: 05/12/2013 06:51   MAU COURSE IV Rocephin, fluid bolus, zofran, Percocet given. CBC, Pelvic US and renal US ordered to check for possible concurrent PID, kidney stones.   CNM concerned that pt has compliance issues, didn't respond to first and second courses of ABX, had slightly elevated creatinine at last  ED visit and has extensive psych Hx. Consulted w/ Dr. Glo Herring. Pt need inpatient Tx x 48 hours. Will call [facility] to arrange direct admit.  ASSESSMENT 1. Acute pyelonephritis    PLAN Transfer to [facility] or [facility] Urine culture, blood cultures and UDS pending.  SW consult due to pt stress RE: hospitalization, missing work , psych Hx.   [place],  [place] 05/12/2013  8:19 AM    Patient interviewed assessed, and I agree with the above plan. I have spoken with Dr Curly Rim at [facility], who agrees to accept pt for care, probably a couple of days to receive IV antibiotics.  The pt has concerns re: her car, and job, has limited support network, so social work may be of assistance. Bed control [id number] contacted to obtain a Tele-Bed due to syncope presentation.

## 2013-05-12 NOTE — H&P (Signed)
[facility] History and Physical  [name] [name] MWU:[id number] DOB: 22-Nov-1985 DOA: 05/12/2013  Referring physician:  PCP: No PCP Per Patient  Specialists:   Chief Complaint: Abdominal pain with nausea and vomiting  HPI: [name] [name] is a 28 y.o. female  With a history of polycystic ovarian syndrome, frequent UTIs, presents emergency department for abdominal pain nausea and vomiting. Patient was recently in the emergency department and was admitted however signed out AMA due to personal reasons. She has presented to the emergency department again. Patient at this time states that she is feeling much better than she was this morning however is worried about her white blood cell count. Patient has had UTIs was given a prescription for Cipro, which she did not have filled. Patient had complained earlier today of feeling lightheaded and almost passing out, however is no longer feeling this way. She also complains of left flank pain as well as abdominal pain. Patient states she's been nauseated and has vomited once. She was taken to the [name] earlier today and was found to have an abnormal UA. Pyelonephritis was diagnosed and patient was sent to [facility] for further management.  Review of Systems:  Constitutional: Denies fever, chills, diaphoresis, appetite change and fatigue.  HEENT: Denies photophobia, eye pain, redness, hearing loss, ear pain, congestion, sore throat, rhinorrhea, sneezing, mouth sores, trouble swallowing, neck pain, neck stiffness and tinnitus.   Respiratory: Denies SOB, DOE, cough, chest tightness,  and wheezing.   Cardiovascular: Denies chest pain, palpitations and leg swelling.  Gastrointestinal: Admits to abdominal pain with nausea and vomiting. Genitourinary: Complains of of flank pain, left. Musculoskeletal: Denies myalgias, back pain, joint swelling, arthralgias and gait problem.  Skin: Denies pallor, rash and wound.  Neurological:  Denies dizziness, seizures, syncope, weakness, light-headedness, numbness and headaches at this time. Hematological: Denies adenopathy. Easy bruising, personal or family bleeding history  Psychiatric/Behavioral: Denies suicidal ideation, mood changes, confusion, nervousness, sleep disturbance and agitation  Past Medical History  Diagnosis Date  . Tumor of ovary   . Headache(784.0)   . Boils   . Polycystic ovarian syndrome   . Mental disorder   . Anxiety    Past Surgical History  Procedure Laterality Date  . Tumor removal     Social History:  reports that she has been smoking Cigarettes.  She has been smoking about 0.25 packs per day. She does not have any smokeless tobacco history on file. She reports that she drinks alcohol. She reports that she uses illicit drugs (Marijuana).   Allergies  Allergen Reactions  . Ibuprofen Other (See Comments)    Stomach upset    No family history on file.   Prior to Admission medications   Medication Sig Start Date End Date Taking? Authorizing Provider  BIOTIN PO Take 1 tablet by mouth daily.   Yes Historical Provider, MD  FIBER SELECT GUMMIES PO Take 2 tablets by mouth daily.   Yes Historical Provider, MD  Pediatric Multivit-Minerals-C (RA GUMMY VITAMINS & MINERALS) CHEW Chew 2 tablets by mouth daily.   Yes Historical Provider, MD  Prenatal Vit-Fe Fumarate-FA (PRENATAL MULTIVITAMIN) TABS Take 1 tablet by mouth daily.   Yes Historical Provider, MD   Physical Exam: Filed Vitals:   05/12/13 1928  BP: 127/88  Pulse: 84  Temp: 98 F (36.7 C)  Resp: 18     General: Well developed, well nourished, NAD, appears stated age  HEENT: NCAT, PERRLA, EOMI, Anicteic Sclera, mucous membranes moist.  Neck: Supple, no JVD,  no masses  Cardiovascular: S1 S2 auscultated, no rubs, murmurs or gallops. Regular rate and rhythm.  Respiratory: Clear to auscultation bilaterally with equal chest rise  Abdomen: Soft, nontender, nondistended, + bowel  sounds  Extremities: warm dry without cyanosis clubbing or edema  Neuro: AAOx3, cranial nerves grossly intact. Strength 5/5 in patient's upper and lower extremities bilaterally  Skin: Without rashes exudates or nodules, several tattoos  Psych: Normal affect and demeanor with intact judgement and insight  Labs on Admission:  Basic Metabolic Panel:  Recent Labs Lab 05/12/13 0244 05/12/13 1736  NA 143 145  K 3.9 3.1*  CL 107 105  CO2 24  --   GLUCOSE 106* 105*  BUN 8 6  CREATININE 0.99 1.10  CALCIUM 9.2  --    Liver Function Tests:  Recent Labs Lab 05/12/13 0244  AST 15  ALT 11  ALKPHOS 60  BILITOT 0.4  PROT 6.8  ALBUMIN 3.5   No results found for this basename: LIPASE, AMYLASE,  in the last 168 hours No results found for this basename: AMMONIA,  in the last 168 hours CBC:  Recent Labs Lab 05/12/13 0244 05/12/13 1720 05/12/13 1736  WBC 16.2* 10.1  --   NEUTROABS 13.6* 7.1  --   HGB 12.5 11.9* 12.6  HCT 36.0 34.2* 37.0  MCV 92.8 93.7  --   PLT 285 299  --    Cardiac Enzymes: No results found for this basename: CKTOTAL, CKMB, CKMBINDEX, TROPONINI,  in the last 168 hours  BNP (last 3 results) No results found for this basename: PROBNP,  in the last 8760 hours CBG: No results found for this basename: GLUCAP,  in the last 168 hours  Radiological Exams on Admission: US Transvaginal Non-ob  05/12/2013   CLINICAL DATA Pelvic pain, cervical motion tenderness, low grade fever.  EXAM TRANSABDOMINAL AND TRANSVAGINAL ULTRASOUND OF PELVIS  TECHNIQUE Both transabdominal and transvaginal ultrasound examinations of the pelvis were performed. Transabdominal technique was performed for global imaging of the pelvis including uterus, ovaries, adnexal regions, and pelvic cul-de-sac. It was necessary to proceed with endovaginal exam following the transabdominal exam to visualize the endometrium and adnexa.  COMPARISON 02/16/2013 CT, 10/27/2011 ultrasound  FINDINGS Uterus   Measurements: 7.9 x 4.2 x 4.7 cm. No fibroids or other mass visualized.  Endometrium  Thickness: 6 mm.  No focal abnormality visualized.  Right ovary  Measurements: 6.8 x 3.0 x 2.2 cm. Elongated/ovoid configuration. Multiple small cysts.  Left ovary  Measurements: 4.1 x 2.6 x 2.3 cm. Mildly enlarged and elongated in configuration with multiple small cysts.  Other findings  Small amount of free fluid  IMPRESSION Enlarged appearance to the ovaries, similar to prior, may reflect polycystic ovarian syndrome.  Small amount of free fluid.  SIGNATURE  Electronically Signed   By: Carlos Levering M.D.   On: 05/12/2013 06:48   US Pelvis Complete  05/12/2013   CLINICAL DATA Pelvic pain, cervical motion tenderness, low grade fever.  EXAM TRANSABDOMINAL AND TRANSVAGINAL ULTRASOUND OF PELVIS  TECHNIQUE Both transabdominal and transvaginal ultrasound examinations of the pelvis were performed. Transabdominal technique was performed for global imaging of the pelvis including uterus, ovaries, adnexal regions, and pelvic cul-de-sac. It was necessary to proceed with endovaginal exam following the transabdominal exam to visualize the endometrium and adnexa.  COMPARISON 02/16/2013 CT, 10/27/2011 ultrasound  FINDINGS Uterus  Measurements: 7.9 x 4.2 x 4.7 cm. No fibroids or other mass visualized.  Endometrium  Thickness: 6 mm.  No focal abnormality visualized.  Right ovary  Measurements: 6.8 x 3.0 x 2.2 cm. Elongated/ovoid configuration. Multiple small cysts.  Left ovary  Measurements: 4.1 x 2.6 x 2.3 cm. Mildly enlarged and elongated in configuration with multiple small cysts.  Other findings  Small amount of free fluid  IMPRESSION Enlarged appearance to the ovaries, similar to prior, may reflect polycystic ovarian syndrome.  Small amount of free fluid.  SIGNATURE  Electronically Signed   By: Carlos Levering M.D.   On: 05/12/2013 06:48   US Renal  05/12/2013   CLINICAL DATA UTI, hematuria.  EXAM RENAL/URINARY TRACT ULTRASOUND  COMPLETE  COMPARISON 02/16/2013 CT  FINDINGS Right Kidney:  Length: 11.3 cm. Echogenicity within normal limits. No mass or hydronephrosis visualized.  Left Kidney:  Length: 10.7 cm. Echogenicity within normal limits. No mass or hydronephrosis visualized.  Bladder:  Incompletely distended, limiting evaluation. There is mild wall thickening.  IMPRESSION Normal sonographic appearance to the kidneys.  No hydronephrosis.  Mild bladder wall thickening is nonspecific given incomplete distention. Correlate clinically and with urinalysis for cystitis.  SIGNATURE  Electronically Signed   By: Carlos Levering M.D.   On: 05/12/2013 06:51    OM:[id number]  Assessment/Plan  Acute pyelonephritis Patient will be admitted to medical floor. Her UA showed 36 white blood cells with few bacteria, too numerous to count red blood cells moderate leukocytes and positive for nitrites.  It appears that patient was here earlier today however left [place], her labs at this time appear to have improved since early this morning. Her leukocytosis has resolved. Patient was placed on ceftriaxone, pending a urine culture and sensitivities as well as blood cultures. She also be placed on normal saline.  Hypokalemia Potassium is 3.1, this is likely to delusional effect from normal saline received earlier today. Will replace and continue to monitor her BMP.  Polysubstance abuse including marijuana as well as tobacco Patient has been counseled on drug cessation as well as nicotine and smoking cessation. Toxicology screen was positive for THC.  PCOS Found on transvaginal and pelvic ultrasound.  Patient will need outpatient followup.  Near syncope No longer feeling syncopal. Likely secondary to her acute infection as well as dehydration. Patient is not orthostatic at this time. Will obtain echocardiogram as well as EKG, arrhythmia cannot be ruled out at this time.  DVT prophylaxis: Lovenox  Code Status: Full  Condition:  Guarded  Family Communication: None at bedside. Admission, patients condition and plan of care including tests being ordered have been discussed with the patient, who indicates understanding and agrees with the plan and Code Status.  Disposition Plan: Admitted   Time spent: 45 minutes  Natosha Bou D.O. [facility] Pager [phone]  If 7PM-7AM, please contact night-coverage [email or web address] Password [facility] 05/12/2013, 7:40 PM

## 2013-05-12 NOTE — Progress Notes (Signed)
Pt ambulated off elevator with carelink for admission.  Education provided to patient about MD wanting her to wear telemetry for monitoring. Pt refuses to wear telemetry at this time. MD made aware. Arkin Imran A

## 2013-05-12 NOTE — Progress Notes (Signed)
Received patient from ER awake, alert and oriented x 4. IV NS 40 mEq KCL @ 75 mls/hr via  Rt Hand PIV. Pt is upset and angry because "they did not feed me in the ER" and she doesn't want to be here. We provided patient with a sandwich and drinks. She also express the need to shower  " wash my tail". We will call MD for a shower order. We attempted to oriented pt to room, call bell and telephone service.  Pt refused to get into bed and is presently  sitting by the window talking on her cell phone. We will continue  to monitor and provide the necessary care required for patient.

## 2013-05-12 NOTE — Progress Notes (Signed)
Pt left the unit to smoke without asking anyone.  She was escorted back to the floor by security.  NP and House supervisor were informed.  House supervisor  came and spoke to patient.

## 2013-05-13 ENCOUNTER — Other Ambulatory Visit: Payer: Self-pay

## 2013-05-13 DIAGNOSIS — I059 Rheumatic mitral valve disease, unspecified: Secondary | ICD-10-CM

## 2013-05-13 DIAGNOSIS — N915 Oligomenorrhea, unspecified: Secondary | ICD-10-CM

## 2013-05-13 DIAGNOSIS — R946 Abnormal results of thyroid function studies: Secondary | ICD-10-CM

## 2013-05-13 DIAGNOSIS — R7989 Other specified abnormal findings of blood chemistry: Secondary | ICD-10-CM

## 2013-05-13 DIAGNOSIS — F3130 Bipolar disorder, current episode depressed, mild or moderate severity, unspecified: Secondary | ICD-10-CM

## 2013-05-13 LAB — BASIC METABOLIC PANEL
BUN: 9 mg/dL (ref 6–23)
CALCIUM: 8.7 mg/dL (ref 8.4–10.5)
CO2: 24 mEq/L (ref 19–32)
CREATININE: 0.9 mg/dL (ref 0.50–1.10)
Chloride: 109 mEq/L (ref 96–112)
GFR calc Af Amer: >90 mL/min (ref >90)
GFR, EST NON AFRICAN AMERICAN: 86 mL/min — AB (ref >90)
Glucose, Bld: 104 mg/dL — ABNORMAL HIGH (ref 70–99)
Potassium: 3.8 mEq/L (ref 3.7–5.3)
Sodium: 143 mEq/L (ref 137–147)

## 2013-05-13 LAB — CBC
HCT: 33.7 % — ABNORMAL LOW (ref 36.0–46.0)
Hemoglobin: 11.4 g/dL — ABNORMAL LOW (ref 12.0–15.0)
MCH: 32.2 pg (ref 26.0–34.0)
MCHC: 33.8 g/dL (ref 30.0–36.0)
MCV: 95.2 fL (ref 78.0–100.0)
PLATELETS: 275 10*3/uL (ref 150–400)
RBC: 3.54 MIL/uL — ABNORMAL LOW (ref 3.87–5.11)
RDW: 12.7 % (ref 11.5–15.5)
WBC: 7.5 10*3/uL (ref 4.0–10.5)

## 2013-05-13 LAB — TSH: TSH: 0.197 u[IU]/mL — ABNORMAL LOW (ref 0.350–4.500)

## 2013-05-13 MED ORDER — CIPROFLOXACIN HCL 500 MG PO TABS: 500.0000 mg | ORAL_TABLET | Freq: Two times a day (BID) | ORAL | Status: DC

## 2013-05-13 NOTE — Discharge Summary (Addendum)
Physician Discharge Summary  [name] [name] ERX:[id number] DOB: May 12, 1985 DOA: 05/12/2013  PCP: No PCP Per Patient  Admit date: 05/12/2013 Discharge date: 05/13/2013  Recommendations for Outpatient Follow-up:  -  Follow up with primary care doctor within 1 week for repeat TSH and TFTs.  F/u pending blood cultures and urine culture.     Discharge Diagnoses:  Principal Problem:   Acute pyelonephritis Active Problems:   PCOS (polycystic ovarian syndrome)   Hypokalemia   Near syncope   Polysubstance abuse   Abnormal TSH   Discharge Condition: stable, improved  Diet recommendation: regular  Wt Readings from Last 3 Encounters:  05/12/13 90.493 kg (199 lb 8 oz)  05/12/13 87.998 kg (194 lb)  05/04/13 87.998 kg (194 lb)    History of present illness:  [name] [name] is a 28 y.o. female  With a history of polycystic ovarian syndrome, frequent UTIs, presents emergency department for abdominal pain nausea and vomiting. Patient was recently in the emergency department and was admitted however signed out AMA due to personal reasons. She has presented to the emergency department again. Patient at this time states that she is feeling much better than she was this morning however is worried about her white blood cell count. Patient has had UTIs was given a prescription for Cipro, which she did not have filled. Patient had complained earlier today of feeling lightheaded and almost passing out, however is no longer feeling this way. She also complains of left flank pain as well as abdominal pain. Patient states she's been nauseated and has vomited once. She was taken to the [facility] earlier today and was found to have an abnormal UA. Pyelonephritis was diagnosed and patient was sent to [facility] long for further management.   Hospital Course:  Acute pyelonephritis due to E. coli, UA showed 36 white blood cells with few bacteria, too numerous to count red blood cells moderate  leukocytes and positive for nitrites.  Her leukocytosis resolved with antibiotics. Patient was placed on ceftriaxone.  Urine culture and sensitivities as well as blood cultures are pending at the time of discharge.  She should complete a 7 day course of ciprofloxacin.  RUS demonstrated normal  Kidneys and mild bladder wall thickening suggestive of cystitis.    Hypokalemia potassium is 3.1, this is likely to delusional effect from normal saline received earlier in day.  Resolved with oral repletion.  Polysubstance abuse including marijuana as well as tobacco Patient has been counseled on drug cessation as well as nicotine and smoking cessation. Toxicology screen was positive for THC.   PCOS Found on transvaginal and pelvic ultrasound on 3/12.  Patient will need outpatient followup.   Near syncope, orthostatics negative.  ECG demonstrated NSR and was stable from prior.  ECHO demonstrated preserved EF, mild MR, no PFO and hypermobile interatrial septum.    Abnormal TSH,  Given history of anxiety, may have some subclinical hyperthyroidism.  No tachycardia or tremor at this time.  May have been affected by pyelonephritis.  Recommend she have repeat TFTs done in a few weeks.   Bipolar disorder, stable.    Normocytic anemia may be secondary to hypermenorrhagia.  Defer further evaluation to PCP.    Procedures:  RUS  Pelvic/transvaginal US  Consultations:  None  Antibiotics  Ceftriaxone 3/12 >> 3/13  Discharge Exam: Filed Vitals:   05/13/13 0621  BP: 115/74  Pulse: 55  Temp: 97.6 F (36.4 C)  Resp: 18   Filed Vitals:   05/12/13 1924 05/12/13  1928 05/20/13 2135 05/13/13 0621  BP: 108/49 127/88 108/71 115/74  Pulse:  84 58 55  Temp: 98.2 F (36.8 C) 98 F (36.7 C) 97.6 F (36.4 C) 97.6 F (36.4 C)  TempSrc: Oral Oral Oral Oral  Resp: 19 18 16 18   Height:   5\' 11"  (1.803 m)   Weight:   90.493 kg (199 lb 8 oz)   SpO2: 97% 98% 100% 100%    General: BF, NAD HEENT:  NCAT,  MMM Cardiovascular: RRR, no mrg, 2+ pulses Respiratory: CTAB, no increased WOB ABD:  NABS, soft, NT/ND MSK:  No LEE Neuro:  Grossly intact, no tremor Psych:  Anxious appearing  Discharge Instructions      Discharge Orders   Future Orders Complete By Expires   Call MD for:  difficulty breathing, headache or visual disturbances  As directed    Call MD for:  extreme fatigue  As directed    Call MD for:  hives  As directed    Call MD for:  persistant dizziness or light-headedness  As directed    Call MD for:  persistant nausea and vomiting  As directed    Call MD for:  severe uncontrolled pain  As directed    Call MD for:  temperature >100.4  As directed    Diet general  As directed    Discharge instructions  As directed    Comments:     You were hospitalized with pyelonephritis or kidney infection.  Please start taking ciprofloxacin twice daily until gone, first dose is tomorrow.  For your abnormal thyroid test, you should schedule an appointment with a primary care doctor within 1 week for further testing.   Increase activity slowly  As directed        Medication List    STOP taking these medications       RA GUMMY VITAMINS & MINERALS Chew      TAKE these medications       BIOTIN PO  Take 1 tablet by mouth daily.     ciprofloxacin 500 MG tablet  Commonly known as:  CIPRO  Take 1 tablet (500 mg total) by mouth 2 (two) times daily.     FIBER SELECT GUMMIES PO  Take 2 tablets by mouth daily.     prenatal multivitamin Tabs tablet  Take 1 tablet by mouth daily.       Follow-up Information   Follow up with [facility].   Specialty:  Pediatrics   Contact information:   [address] [place] [address] [phone]       Follow up with [facility]    . Schedule an appointment as soon as possible for a visit in 1 week.   Contact information:   [place]  [phone]       The results of significant diagnostics from this hospitalization (including imaging, microbiology, ancillary and laboratory) are listed below for reference.    Significant Diagnostic Studies: US Transvaginal Non-ob  May 20, 2013   CLINICAL DATA Pelvic pain, cervical motion tenderness, low grade fever.  EXAM TRANSABDOMINAL AND TRANSVAGINAL ULTRASOUND OF PELVIS  TECHNIQUE Both transabdominal and transvaginal ultrasound examinations of the pelvis were performed. Transabdominal technique was performed for global imaging of the pelvis including uterus, ovaries, adnexal regions, and pelvic cul-de-sac. It was necessary to proceed with endovaginal exam following the transabdominal exam to visualize the endometrium and adnexa.  COMPARISON 02/16/2013 CT,  10/27/2011 ultrasound  FINDINGS Uterus  Measurements: 7.9 x 4.2 x 4.7 cm. No fibroids or other mass visualized.  Endometrium  Thickness: 6 mm.  No focal abnormality visualized.  Right ovary  Measurements: 6.8 x 3.0 x 2.2 cm. Elongated/ovoid configuration. Multiple small cysts.  Left ovary  Measurements: 4.1 x 2.6 x 2.3 cm. Mildly enlarged and elongated in configuration with multiple small cysts.  Other findings  Small amount of free fluid  IMPRESSION Enlarged appearance to the ovaries, similar to prior, may reflect polycystic ovarian syndrome.  Small amount of free fluid.  SIGNATURE  Electronically Signed   By: Carlos Levering M.D.   On: 05/12/2013 06:48   US Pelvis Complete  05/12/2013   CLINICAL DATA Pelvic pain, cervical motion tenderness, low grade fever.  EXAM TRANSABDOMINAL AND TRANSVAGINAL ULTRASOUND OF PELVIS  TECHNIQUE Both transabdominal and transvaginal ultrasound examinations of the pelvis were performed. Transabdominal technique was performed for global imaging of the pelvis including uterus, ovaries, adnexal regions, and pelvic cul-de-sac. It was necessary to proceed with endovaginal exam following the transabdominal  exam to visualize the endometrium and adnexa.  COMPARISON 02/16/2013 CT, 10/27/2011 ultrasound  FINDINGS Uterus  Measurements: 7.9 x 4.2 x 4.7 cm. No fibroids or other mass visualized.  Endometrium  Thickness: 6 mm.  No focal abnormality visualized.  Right ovary  Measurements: 6.8 x 3.0 x 2.2 cm. Elongated/ovoid configuration. Multiple small cysts.  Left ovary  Measurements: 4.1 x 2.6 x 2.3 cm. Mildly enlarged and elongated in configuration with multiple small cysts.  Other findings  Small amount of free fluid  IMPRESSION Enlarged appearance to the ovaries, similar to prior, may reflect polycystic ovarian syndrome.  Small amount of free fluid.  SIGNATURE  Electronically Signed   By: Carlos Levering M.D.   On: 05/12/2013 06:48   US Renal  05/12/2013   CLINICAL DATA UTI, hematuria.  EXAM RENAL/URINARY TRACT ULTRASOUND COMPLETE  COMPARISON 02/16/2013 CT  FINDINGS Right Kidney:  Length: 11.3 cm. Echogenicity within normal limits. No mass or hydronephrosis visualized.  Left Kidney:  Length: 10.7 cm. Echogenicity within normal limits. No mass or hydronephrosis visualized.  Bladder:  Incompletely distended, limiting evaluation. There is mild wall thickening.  IMPRESSION Normal sonographic appearance to the kidneys.  No hydronephrosis.  Mild bladder wall thickening is nonspecific given incomplete distention. Correlate clinically and with urinalysis for cystitis.  SIGNATURE  Electronically Signed   By: Carlos Levering M.D.   On: 05/12/2013 06:51    Microbiology: Recent Results (from the past 240 hour(s))  WET PREP, GENITAL     Status: Abnormal   Collection Time    05/04/13  2:00 AM      Result Value Ref Range Status   Yeast Wet Prep HPF POC NONE SEEN  NONE SEEN Final   Trich, Wet Prep NONE SEEN  NONE SEEN Final   Clue Cells Wet Prep HPF POC MODERATE (*) NONE SEEN Final   WBC, Wet Prep HPF POC FEW (*) NONE SEEN Final   Comment: MODERATE BACTERIA SEEN  GC/CHLAMYDIA PROBE AMP     Status: None   Collection  Time    05/04/13  2:00 AM      Result Value Ref Range Status   CT Probe RNA NEGATIVE  NEGATIVE Final   GC Probe RNA NEGATIVE  NEGATIVE Final   Comment: (NOTE)                                                                                               **  Normal Reference Range: Negative**          Assay performed using the Gen-Probe APTIMA COMBO2 (R) Assay.     Acceptable specimen types for this assay include APTIMA Swabs (Unisex,     endocervical, urethral, or vaginal), first void urine, and ThinPrep     liquid based cytology samples.     Performed at [facility]     Status: None   Collection Time    05/12/13  2:05 AM      Result Value Ref Range Status   Specimen Description URINE, CLEAN CATCH   Final   Special Requests Normal   Final   Culture  Setup Time     Final   Value: 05/12/2013 08:14     Performed at [facility] Count     Final   Value: >=100,000 COLONIES/ML     Performed at [facility]   Culture     Final   Value: ESCHERICHIA COLI     Performed at [facility]   Report Status PENDING   Incomplete  CULTURE, BLOOD (ROUTINE X 2)     Status: None   Collection Time    05/12/13  8:00 AM      Result Value Ref Range Status   Specimen Description BLOOD RIGHT ARM   Final   Special Requests     Final   Value: BOTTLES DRAWN AEROBIC AND ANAEROBIC 5CC EACH BOTTLE   Culture  Setup Time     Final   Value: 05/12/2013 10:07     Performed at [facility]   Culture     Final   Value:        BLOOD CULTURE RECEIVED NO GROWTH TO DATE CULTURE WILL BE HELD FOR 5 DAYS BEFORE ISSUING A FINAL NEGATIVE REPORT     Performed at [facility]   Report Status PENDING   Incomplete  CULTURE, BLOOD (ROUTINE X 2)     Status: None   Collection Time    05/12/13  8:06 AM      Result Value Ref Range Status   Specimen Description BLOOD RIGHT HAND   Final   Special Requests BOTTLES DRAWN AEROBIC AND ANAEROBIC 5CC EACH   Final    Culture  Setup Time     Final   Value: 05/12/2013 10:06     Performed at [facility]   Culture     Final   Value:        BLOOD CULTURE RECEIVED NO GROWTH TO DATE CULTURE WILL BE HELD FOR 5 DAYS BEFORE ISSUING A FINAL NEGATIVE REPORT     Performed at [facility]   Report Status PENDING   Incomplete     Labs: Basic Metabolic Panel:  Recent Labs Lab 05/12/13 0244 05/12/13 1713 05/12/13 1736 05/13/13 0423  NA 143  --  145 143  K 3.9  --  3.1* 3.8  CL 107  --  105 109  CO2 24  --   --  24  GLUCOSE 106*  --  105* 104*  BUN 8  --  6 9  CREATININE 0.99 0.98 1.10 0.90  CALCIUM 9.2  --   --  8.7   Liver Function Tests:  Recent Labs Lab 05/12/13 0244  AST 15  ALT 11  ALKPHOS 60  BILITOT 0.4  PROT 6.8  ALBUMIN 3.5   No results found for this basename: LIPASE, AMYLASE,  in  the last 168 hours No results found for this basename: AMMONIA,  in the last 168 hours CBC:  Recent Labs Lab 05/12/13 0244 05/12/13 1720 05/12/13 1736 05/13/13 0423  WBC 16.2* 10.1  --  7.5  NEUTROABS 13.6* 7.1  --   --   HGB 12.5 11.9* 12.6 11.4*  HCT 36.0 34.2* 37.0 33.7*  MCV 92.8 93.7  --  95.2  PLT 285 299  --  275   Cardiac Enzymes: No results found for this basename: CKTOTAL, CKMB, CKMBINDEX, TROPONINI,  in the last 168 hours BNP: BNP (last 3 results) No results found for this basename: PROBNP,  in the last 8760 hours CBG: No results found for this basename: GLUCAP,  in the last 168 hours  Time coordinating discharge: 35 minutes  Signed:  Euva Rundell  [facility] 05/13/2013, 1:02 PM

## 2013-05-13 NOTE — Progress Notes (Signed)
Pt has bed elevated, stating "Maybe it's in my head but it's warmer up here".  RN explained increased safety risk and pt refused to have bed lowered.  RN continuing to monitor.

## 2013-05-13 NOTE — Progress Notes (Signed)
Echocardiogram 2D Echocardiogram has been performed.  [name] [name] 05/13/2013, 9:47 AM

## 2013-05-13 NOTE — Progress Notes (Signed)
Patient refused Lovenox shot.

## 2013-05-13 NOTE — Progress Notes (Signed)
Discharge instructions explained to patient; pt verbalized understanding instructions.  Pt provided w/ a copy of discharge instructions.

## 2013-05-14 LAB — URINE CULTURE
Colony Count: >=100000
Special Requests: NORMAL

## 2013-05-15 ENCOUNTER — Encounter: Payer: Self-pay | Admitting: Advanced Practice Midwife

## 2013-05-15 DIAGNOSIS — N390 Urinary tract infection, site not specified: Secondary | ICD-10-CM

## 2013-05-15 HISTORY — DX: Urinary tract infection, site not specified: N39.0

## 2013-05-18 LAB — CULTURE, BLOOD (ROUTINE X 2)
CULTURE: NO GROWTH
Culture: NO GROWTH

## 2013-07-10 ENCOUNTER — Encounter (HOSPITAL_COMMUNITY): Payer: Self-pay | Admitting: Emergency Medicine

## 2013-07-10 ENCOUNTER — Emergency Department (HOSPITAL_COMMUNITY)
Admission: EM | Admit: 2013-07-10 | Discharge: 2013-07-11 | Disposition: A | Discharge status: Home / Self Care | Payer: Self-pay | Attending: Emergency Medicine | Admitting: Emergency Medicine

## 2013-07-10 DIAGNOSIS — Z8639 Personal history of other endocrine, nutritional and metabolic disease: Secondary | ICD-10-CM | POA: Insufficient documentation

## 2013-07-10 DIAGNOSIS — Z79899 Other long term (current) drug therapy: Secondary | ICD-10-CM | POA: Insufficient documentation

## 2013-07-10 DIAGNOSIS — N390 Urinary tract infection, site not specified: Secondary | ICD-10-CM | POA: Insufficient documentation

## 2013-07-10 DIAGNOSIS — Z8659 Personal history of other mental and behavioral disorders: Secondary | ICD-10-CM | POA: Insufficient documentation

## 2013-07-10 DIAGNOSIS — Z872 Personal history of diseases of the skin and subcutaneous tissue: Secondary | ICD-10-CM | POA: Insufficient documentation

## 2013-07-10 DIAGNOSIS — Z792 Long term (current) use of antibiotics: Secondary | ICD-10-CM | POA: Insufficient documentation

## 2013-07-10 DIAGNOSIS — Z862 Personal history of diseases of the blood and blood-forming organs and certain disorders involving the immune mechanism: Secondary | ICD-10-CM | POA: Insufficient documentation

## 2013-07-10 DIAGNOSIS — Z8742 Personal history of other diseases of the female genital tract: Secondary | ICD-10-CM | POA: Insufficient documentation

## 2013-07-10 DIAGNOSIS — Z3202 Encounter for pregnancy test, result negative: Secondary | ICD-10-CM | POA: Insufficient documentation

## 2013-07-10 DIAGNOSIS — N898 Other specified noninflammatory disorders of vagina: Secondary | ICD-10-CM | POA: Insufficient documentation

## 2013-07-10 DIAGNOSIS — F17200 Nicotine dependence, unspecified, uncomplicated: Secondary | ICD-10-CM | POA: Insufficient documentation

## 2013-07-10 LAB — POC URINE PREG, ED: PREG TEST UR: NEGATIVE

## 2013-07-10 NOTE — ED Provider Notes (Signed)
CSN: [id number]     Arrival date & time 07/10/13  2247 History   First MD Initiated Contact with Patient 07/10/13 2307     Chief Complaint  Patient presents with  . Dysuria  . Hematuria     (Consider location/radiation/quality/duration/timing/severity/associated sxs/prior Treatment) HPI  28 year old female with history of polysubstance abuse, anxiety, PCOS, UTI presents c/o dysuria.  Pt report having dysuria since yesterday.  Report frequency, with small amount of urine, and blood in urine.  Sxs felt similar to UTI.  Started her menstruation today.  Report mild suprapubic abd pain.  Denies fever, chills, cp, sob, back pain, flank pain, n/v/d, vaginal discharge.    Past Medical History  Diagnosis Date  . Tumor of ovary   . Headache(784.0)   . Boils   . Polycystic ovarian syndrome   . Mental disorder   . Anxiety    Past Surgical History  Procedure Laterality Date  . Tumor removal     No family history on file. History  Substance Use Topics  . Smoking status: Current Every Day Smoker -- 0.25 packs/day    Types: Cigarettes  . Smokeless tobacco: Not on file  . Alcohol Use: Yes     Comment: SMOKED MARIJUANA- JULY,  COCAINE-  NONE THIS YEAR.      SMOKED LAST  AT 0030.   OB History   Grav Para Term Preterm Abortions TAB SAB Ect Mult Living   0              Review of Systems  Constitutional: Negative for fever.  Gastrointestinal: Positive for abdominal pain.  Genitourinary: Positive for dysuria, frequency, decreased urine volume and vaginal bleeding. Negative for flank pain.  Musculoskeletal: Negative for back pain.  Skin: Negative for rash and wound.  Neurological: Negative for numbness.  All other systems reviewed and are negative.     Allergies  Ibuprofen  Home Medications   Prior to Admission medications   Medication Sig Start Date End Date Taking? Authorizing Provider  BIOTIN PO Take 1 tablet by mouth daily.    Historical Provider, MD  ciprofloxacin (CIPRO)  500 MG tablet Take 1 tablet (500 mg total) by mouth 2 (two) times daily. 05/13/13   Janece Canterbury, MD  FIBER SELECT GUMMIES PO Take 2 tablets by mouth daily.    Historical Provider, MD  Prenatal Vit-Fe Fumarate-FA (PRENATAL MULTIVITAMIN) TABS Take 1 tablet by mouth daily.    Historical Provider, MD   BP 110/66  Pulse 89  Temp(Src) 98.2 F (36.8 C) (Oral)  Resp 18  Ht 5\' 11"  (1.803 m)  SpO2 99%  LMP 07/10/2013 Physical Exam  Nursing note and vitals reviewed. Constitutional: She is oriented to person, place, and time. She appears well-developed and well-nourished. No distress.  HENT:  Head: Atraumatic.  Eyes: Conjunctivae are normal.  Neck: Neck supple.  Pulmonary/Chest: Effort normal and breath sounds normal.  Abdominal: Soft. Bowel sounds are normal. She exhibits no distension. There is tenderness (Very mild suprapubic tenderness without guarding or rebound tenderness.). There is no rebound and no guarding.  Negative Murphy sign, no pain at McBurney's point  Genitourinary:  No CVA tenderness  Neurological: She is alert and oriented to person, place, and time.  Skin: No rash noted.  Psychiatric: She has a normal mood and affect.    ED Course  Procedures (including critical care time)  11:23 PM Patient here with dysuria concern for urinary tract infection. She has a benign abdomen and no CVA tenderness. Low suspicion  for kidney stone. No suspicion for pyelonephritis. Doubt STD given her dysuria. She is afebrile with stable normal vital sign and no systemic involvement.  12:15 AM UA did demonstrate evidence of urinary tract infection. There is evidence of hematuria but this could also be related to her recent menstruation. Plan to treat patient with nitrofuratoin.  Return precaution discussed.  Given prior hx of pyelonephritis, will obtain urine culture.  Pyridium given for urinary discomfort.  Pt made aware this may change her urine color to orange.    Labs Review Labs Reviewed   URINALYSIS, ROUTINE W REFLEX MICROSCOPIC - Abnormal; Notable for the following:    APPearance CLOUDY (*)    Hgb urine dipstick LARGE (*)    Protein, ur 30 (*)    Leukocytes, UA LARGE (*)    All other components within normal limits  URINE MICROSCOPIC-ADD ON - Abnormal; Notable for the following:    Bacteria, UA FEW (*)    All other components within normal limits  URINE CULTURE  POC URINE PREG, ED    Imaging Review No results found.   EKG Interpretation None      MDM   Final diagnoses:  UTI (lower urinary tract infection)    BP 110/66  Pulse 89  Temp(Src) 98.2 F (36.8 C) (Oral)  Resp 18  Ht 5\' 11"  (1.803 m)  SpO2 99%  LMP 07/10/2013     Domenic Moras, PA-C 07/11/13 0019

## 2013-07-10 NOTE — ED Notes (Signed)
Pt c/o dysuria and hematuria onset yesterday. Pt states she was recently admitted here for same.

## 2013-07-11 LAB — URINALYSIS, ROUTINE W REFLEX MICROSCOPIC
Bilirubin Urine: NEGATIVE
Glucose, UA: NEGATIVE mg/dL
Ketones, ur: NEGATIVE mg/dL
NITRITE: NEGATIVE
PH: 6 (ref 5.0–8.0)
Protein, ur: 30 mg/dL — AB
Specific Gravity, Urine: 1.023 (ref 1.005–1.030)
Urobilinogen, UA: 1 mg/dL (ref 0.0–1.0)

## 2013-07-11 LAB — URINE MICROSCOPIC-ADD ON

## 2013-07-11 MED ORDER — PHENAZOPYRIDINE HCL 200 MG PO TABS: 200.0000 mg | ORAL_TABLET | Freq: Three times a day (TID) | ORAL | Status: DC

## 2013-07-11 MED ORDER — NITROFURANTOIN MONOHYD MACRO 100 MG PO CAPS: 100.0000 mg | ORAL_CAPSULE | Freq: Two times a day (BID) | ORAL | Status: DC

## 2013-07-11 MED ORDER — NITROFURANTOIN MONOHYD MACRO 100 MG PO CAPS
100.0000 mg | ORAL_CAPSULE | Freq: Once | ORAL | Status: AC
  Administered 2013-07-11: 100 mg via ORAL
  Filled 2013-07-11: qty 1

## 2013-07-11 NOTE — ED Provider Notes (Signed)
Medical screening examination/treatment/procedure(s) were performed by non-physician practitioner and as supervising physician I was immediately available for consultation/collaboration.   EKG Interpretation None       Varney Biles, MD 07/11/13 [phone]

## 2013-07-11 NOTE — Discharge Instructions (Signed)
Urinary Tract Infection  Urinary tract infections (UTIs) can develop anywhere along your urinary tract. Your urinary tract is your body's drainage system for removing wastes and extra water. Your urinary tract includes two kidneys, two ureters, a bladder, and a urethra. Your kidneys are a pair of bean-shaped organs. Each kidney is about the size of your fist. They are located below your ribs, one on each side of your spine.  CAUSES  Infections are caused by microbes, which are microscopic organisms, including fungi, viruses, and bacteria. These organisms are so small that they can only be seen through a microscope. Bacteria are the microbes that most commonly cause UTIs.  SYMPTOMS   Symptoms of UTIs may vary by age and gender of the patient and by the location of the infection. Symptoms in young women typically include a frequent and intense urge to urinate and a painful, burning feeling in the bladder or urethra during urination. Older women and men are more likely to be tired, shaky, and weak and have muscle aches and abdominal pain. A fever may mean the infection is in your kidneys. Other symptoms of a kidney infection include pain in your back or sides below the ribs, nausea, and vomiting.  DIAGNOSIS  To diagnose a UTI, your caregiver will ask you about your symptoms. Your caregiver also will ask to provide a urine sample. The urine sample will be tested for bacteria and white blood cells. White blood cells are made by your body to help fight infection.  TREATMENT   Typically, UTIs can be treated with medication. Because most UTIs are caused by a bacterial infection, they usually can be treated with the use of antibiotics. The choice of antibiotic and length of treatment depend on your symptoms and the type of bacteria causing your infection.  HOME CARE INSTRUCTIONS   If you were prescribed antibiotics, take them exactly as your caregiver instructs you. Finish the medication even if you feel better after you  have only taken some of the medication.   Drink enough water and fluids to keep your urine clear or pale yellow.   Avoid caffeine, tea, and carbonated beverages. They tend to irritate your bladder.   Empty your bladder often. Avoid holding urine for long periods of time.   Empty your bladder before and after sexual intercourse.   After a bowel movement, women should cleanse from front to back. Use each tissue only once.  SEEK MEDICAL CARE IF:    You have back pain.   You develop a fever.   Your symptoms do not begin to resolve within 3 days.  SEEK IMMEDIATE MEDICAL CARE IF:    You have severe back pain or lower abdominal pain.   You develop chills.   You have nausea or vomiting.   You have continued burning or discomfort with urination.  MAKE SURE YOU:    Understand these instructions.   Will watch your condition.   Will get help right away if you are not doing well or get worse.  Document Released: 11/27/2004 Document Revised: 08/19/2011 Document Reviewed: 03/28/2011  [employer] Patient Information 2014 [employer].

## 2013-07-12 LAB — URINE CULTURE: Colony Count: >=100000

## 2013-07-12 NOTE — Progress Notes (Signed)
ED CM placed outgoing call to Pt concerning message about medication assistance. Pt presented to [facility] ED on 5/10 with Dysuria and Hematuria.  Pt was given a antibiotic. Checked the $4 list at [employer]Med is not on list. Reviewed record patient has had 3 ED visits and 2 hospitalization. Confirmed information in record. Pt does not have  medical coverage Discussed f/u care and establishing care with a PCP. Discussed the [facility] and it's services.  Pt verbalized interest. Instructed her to walk in tomorrow morning to schedule an appt. And she can get he prescriptions fill at the [facility] pharmacy. [facility] Pharmacist was sent an email regarding patient. Pt verbalized understanding teach back done and she is agreeable with plan. Will contact clinic with patient information. Pt given contact info if any question or concerns should arise.  No further ED CM needs identified.

## 2013-08-02 ENCOUNTER — Ambulatory Visit: Payer: Self-pay

## 2013-08-06 ENCOUNTER — Emergency Department (HOSPITAL_COMMUNITY)
Admission: EM | Admit: 2013-08-06 | Discharge: 2013-08-06 | Disposition: A | Discharge status: Home / Self Care | Payer: Self-pay | Attending: Emergency Medicine | Admitting: Emergency Medicine

## 2013-08-06 ENCOUNTER — Encounter (HOSPITAL_COMMUNITY): Payer: Self-pay | Admitting: Emergency Medicine

## 2013-08-06 DIAGNOSIS — Z792 Long term (current) use of antibiotics: Secondary | ICD-10-CM | POA: Insufficient documentation

## 2013-08-06 DIAGNOSIS — B9689 Other specified bacterial agents as the cause of diseases classified elsewhere: Secondary | ICD-10-CM

## 2013-08-06 DIAGNOSIS — Z8659 Personal history of other mental and behavioral disorders: Secondary | ICD-10-CM | POA: Insufficient documentation

## 2013-08-06 DIAGNOSIS — Z79899 Other long term (current) drug therapy: Secondary | ICD-10-CM | POA: Insufficient documentation

## 2013-08-06 DIAGNOSIS — N760 Acute vaginitis: Secondary | ICD-10-CM | POA: Insufficient documentation

## 2013-08-06 DIAGNOSIS — N390 Urinary tract infection, site not specified: Secondary | ICD-10-CM | POA: Insufficient documentation

## 2013-08-06 DIAGNOSIS — F17200 Nicotine dependence, unspecified, uncomplicated: Secondary | ICD-10-CM | POA: Insufficient documentation

## 2013-08-06 LAB — WET PREP, GENITAL
TRICH WET PREP: NONE SEEN
WBC, Wet Prep HPF POC: NONE SEEN
Yeast Wet Prep HPF POC: NONE SEEN

## 2013-08-06 LAB — URINALYSIS, ROUTINE W REFLEX MICROSCOPIC
Bilirubin Urine: NEGATIVE
GLUCOSE, UA: NEGATIVE mg/dL
Ketones, ur: NEGATIVE mg/dL
Nitrite: NEGATIVE
PROTEIN: 100 mg/dL — AB
Specific Gravity, Urine: 1.021 (ref 1.005–1.030)
Urobilinogen, UA: 1 mg/dL (ref 0.0–1.0)
pH: 7.5 (ref 5.0–8.0)

## 2013-08-06 LAB — POC URINE PREG, ED: PREG TEST UR: NEGATIVE

## 2013-08-06 LAB — URINE MICROSCOPIC-ADD ON

## 2013-08-06 MED ORDER — PHENAZOPYRIDINE HCL 100 MG PO TABS
200.0000 mg | ORAL_TABLET | Freq: Once | ORAL | Status: AC
  Administered 2013-08-06: 200 mg via ORAL
  Filled 2013-08-06: qty 2

## 2013-08-06 MED ORDER — METRONIDAZOLE 500 MG PO TABS: 500.0000 mg | ORAL_TABLET | Freq: Two times a day (BID) | ORAL | Status: DC

## 2013-08-06 MED ORDER — FLUCONAZOLE 150 MG PO TABS: 150.0000 mg | ORAL_TABLET | Freq: Every day | ORAL | Status: DC

## 2013-08-06 MED ORDER — CEPHALEXIN 500 MG PO CAPS: 500.0000 mg | ORAL_CAPSULE | Freq: Four times a day (QID) | ORAL | Status: DC

## 2013-08-06 MED ORDER — KETOROLAC TROMETHAMINE 60 MG/2ML IM SOLN
60.0000 mg | Freq: Once | INTRAMUSCULAR | Status: AC
  Administered 2013-08-06: 60 mg via INTRAMUSCULAR
  Filled 2013-08-06: qty 2

## 2013-08-06 MED ORDER — CEFTRIAXONE SODIUM 1 G IJ SOLR
1.0000 g | Freq: Once | INTRAMUSCULAR | Status: AC
  Administered 2013-08-06: 1 g via INTRAMUSCULAR
  Filled 2013-08-06: qty 10

## 2013-08-06 NOTE — Discharge Instructions (Signed)
Keflex for bladder infection. Flagyl for bacterial vaginosis. Diflucan for yeast infection after finish all antibiotics. Follow up with your doctor for recheck. Take tylenol for pain.  Urinary Tract Infection Urinary tract infections (UTIs) can develop anywhere along your urinary tract. Your urinary tract is your body's drainage system for removing wastes and extra water. Your urinary tract includes two kidneys, two ureters, a bladder, and a urethra. Your kidneys are a pair of bean-shaped organs. Each kidney is about the size of your fist. They are located below your ribs, one on each side of your spine. CAUSES Infections are caused by microbes, which are microscopic organisms, including fungi, viruses, and bacteria. These organisms are so small that they can only be seen through a microscope. Bacteria are the microbes that most commonly cause UTIs. SYMPTOMS  Symptoms of UTIs may vary by age and gender of the patient and by the location of the infection. Symptoms in young women typically include a frequent and intense urge to urinate and a painful, burning feeling in the bladder or urethra during urination. Older women and men are more likely to be tired, shaky, and weak and have muscle aches and abdominal pain. A fever may mean the infection is in your kidneys. Other symptoms of a kidney infection include pain in your back or sides below the ribs, nausea, and vomiting. DIAGNOSIS To diagnose a UTI, your caregiver will ask you about your symptoms. Your caregiver also will ask to provide a urine sample. The urine sample will be tested for bacteria and white blood cells. White blood cells are made by your body to help fight infection. TREATMENT  Typically, UTIs can be treated with medication. Because most UTIs are caused by a bacterial infection, they usually can be treated with the use of antibiotics. The choice of antibiotic and length of treatment depend on your symptoms and the type of bacteria causing  your infection. HOME CARE INSTRUCTIONS  If you were prescribed antibiotics, take them exactly as your caregiver instructs you. Finish the medication even if you feel better after you have only taken some of the medication.  Drink enough water and fluids to keep your urine clear or pale yellow.  Avoid caffeine, tea, and carbonated beverages. They tend to irritate your bladder.  Empty your bladder often. Avoid holding urine for long periods of time.  Empty your bladder before and after sexual intercourse.  After a bowel movement, women should cleanse from front to back. Use each tissue only once. SEEK MEDICAL CARE IF:   You have back pain.  You develop a fever.  Your symptoms do not begin to resolve within 3 days. SEEK IMMEDIATE MEDICAL CARE IF:   You have severe back pain or lower abdominal pain.  You develop chills.  You have nausea or vomiting.  You have continued burning or discomfort with urination. MAKE SURE YOU:   Understand these instructions.  Will watch your condition.  Will get help right away if you are not doing well or get worse. Document Released: 11/27/2004 Document Revised: 08/19/2011 Document Reviewed: 03/28/2011 [facility] Patient Information 2014 [place].  Bacterial Vaginosis Bacterial vaginosis is a vaginal infection that occurs when the normal balance of bacteria in the vagina is disrupted. It results from an overgrowth of certain bacteria. This is the most common vaginal infection in women of childbearing age. Treatment is important to prevent complications, especially in pregnant women, as it can cause a premature delivery. CAUSES  Bacterial vaginosis is caused by an increase  in harmful bacteria that are normally present in smaller amounts in the vagina. Several different kinds of bacteria can cause bacterial vaginosis. However, the reason that the condition develops is not fully understood. RISK FACTORS Certain activities or behaviors can put  you at an increased risk of developing bacterial vaginosis, including:  Having a new sex partner or multiple sex partners.  Douching.  Using an intrauterine device (IUD) for contraception. Women do not get bacterial vaginosis from toilet seats, bedding, swimming pools, or contact with objects around them. SIGNS AND SYMPTOMS  Some women with bacterial vaginosis have no signs or symptoms. Common symptoms include:  Grey vaginal discharge.  A fishlike odor with discharge, especially after sexual intercourse.  Itching or burning of the vagina and vulva.  Burning or pain with urination. DIAGNOSIS  Your health care provider will take a medical history and examine the vagina for signs of bacterial vaginosis. A sample of vaginal fluid may be taken. Your health care provider will look at this sample under a microscope to check for bacteria and abnormal cells. A vaginal pH test may also be done.  TREATMENT  Bacterial vaginosis may be treated with antibiotic medicines. These may be given in the form of a pill or a vaginal cream. A second round of antibiotics may be prescribed if the condition comes back after treatment.  HOME CARE INSTRUCTIONS   Only take over-the-counter or prescription medicines as directed by your health care provider.  If antibiotic medicine was prescribed, take it as directed. Make sure you finish it even if you start to feel better.  Do not have sex until treatment is completed.  Tell all sexual partners that you have a vaginal infection. They should see their health care provider and be treated if they have problems, such as a mild rash or itching.  Practice safe sex by using condoms and only having one sex partner. SEEK MEDICAL CARE IF:   Your symptoms are not improving after 3 days of treatment.  You have increased discharge or pain.  You have a fever. MAKE SURE YOU:   Understand these instructions.  Will watch your condition.  Will get help right away if  you are not doing well or get worse. FOR MORE INFORMATION  Centers for Disease Control and Prevention, Division of STD Prevention: [employer] American Sexual Health Association (ASHA): www.ashastd.org  Document Released: 02/17/2005 Document Revised: 12/08/2012 Document Reviewed: 09/29/2012 [facility] Patient Information 2014 [place].

## 2013-08-06 NOTE — ED Provider Notes (Signed)
Medical screening examination/treatment/procedure(s) were conducted as a shared visit with non-physician practitioner(s) and myself.  I personally evaluated the patient during the encounter.   Pt c/o urinary urgency, dysuria. Back pain. Denies fever or nv. Labs. Ivf, u cx.   Mirna Mires, MD 08/06/13 [phone]

## 2013-08-06 NOTE — ED Notes (Signed)
Pt from home with c/o generalized body pain starting when she woke up this morning. Pt states she also has blood in her urine and burning with urination.  Pt tearful.  A&O.

## 2013-08-06 NOTE — ED Provider Notes (Signed)
CSN: [id number]     Arrival date & time 08/06/13  5176 History   First MD Initiated Contact with Patient 08/06/13 1001     Chief Complaint  Patient presents with  . Pain  . Hematuria     (Consider location/radiation/quality/duration/timing/severity/associated sxs/prior Treatment) HPI [name] [name] is a 28 y.o. female who presents to ED with complaint of urinary frequency, urgency, dysuria, body aches, headache. Pt states her symptoms began this morning. States did not take anything for this. Hx of frequent UTIs. Denies fevers, admits to chills. Reports increased vaginal discharge with bad odor. Denies being pregnant. Denies neck pain or stiffness. No cough. Nothing making her symptoms better or worsen.   Past Medical History  Diagnosis Date  . Tumor of ovary   . Headache(784.0)   . Boils   . Polycystic ovarian syndrome   . Mental disorder   . Anxiety    Past Surgical History  Procedure Laterality Date  . Tumor removal     History reviewed. No pertinent family history. History  Substance Use Topics  . Smoking status: Current Every Day Smoker -- 0.25 packs/day    Types: Cigarettes  . Smokeless tobacco: Not on file  . Alcohol Use: Yes     Comment: SMOKED MARIJUANA- JULY,  COCAINE-  NONE THIS YEAR.      SMOKED LAST  AT 0030.   OB History   Grav Para Term Preterm Abortions TAB SAB Ect Mult Living   0              Review of Systems  Constitutional: Positive for chills. Negative for fever.  Respiratory: Negative for cough, chest tightness and shortness of breath.   Cardiovascular: Negative for chest pain, palpitations and leg swelling.  Gastrointestinal: Positive for abdominal pain. Negative for nausea, vomiting and diarrhea.  Genitourinary: Positive for dysuria, urgency, frequency, hematuria, vaginal discharge and pelvic pain. Negative for flank pain, vaginal bleeding and vaginal pain.  Musculoskeletal: Positive for myalgias. Negative for neck pain and neck stiffness.   Skin: Negative for rash.  Neurological: Negative for dizziness, weakness and headaches.  All other systems reviewed and are negative.     Allergies  Ibuprofen  Home Medications   Prior to Admission medications   Medication Sig Start Date End Date Taking? Authorizing Provider  BIOTIN PO Take 1 tablet by mouth daily.    Historical Provider, MD  CRANBERRY CONCENTRATE PO Take 1 tablet by mouth daily.    Historical Provider, MD  FIBER SELECT GUMMIES PO Take 2 tablets by mouth daily.    Historical Provider, MD  nitrofurantoin, macrocrystal-monohydrate, (MACROBID) 100 MG capsule Take 1 capsule (100 mg total) by mouth 2 (two) times daily. 07/11/13   Domenic Moras, PA-C  phenazopyridine (PYRIDIUM) 200 MG tablet Take 1 tablet (200 mg total) by mouth 3 (three) times daily. 07/11/13   Domenic Moras, PA-C  Prenatal Vit-Fe Fumarate-FA (PRENATAL MULTIVITAMIN) TABS Take 1 tablet by mouth daily.    Historical Provider, MD  pseudoephedrine-acetaminophen (TYLENOL SINUS) 30-500 MG TABS Take 1 tablet by mouth every 4 (four) hours as needed (sinus issues).    Historical Provider, MD   BP 117/70  Pulse 86  Temp(Src) 98.7 F (37.1 C) (Oral)  Resp 35  Ht 5\' 11"  (1.803 m)  Wt 188 lb (85.276 kg)  BMI 26.23 kg/m2  SpO2 97%  LMP 07/10/2013 Physical Exam  Nursing note and vitals reviewed. Constitutional: She is oriented to person, place, and time. She appears well-developed and well-nourished.  Moaning  in pain  HENT:  Head: Normocephalic.  Eyes: Conjunctivae are normal.  Neck: Neck supple.  Cardiovascular: Normal rate, regular rhythm and normal heart sounds.   Pulmonary/Chest: Effort normal and breath sounds normal. No respiratory distress. She has no wheezes. She has no rales.  Abdominal: Soft. Bowel sounds are normal. She exhibits no distension. There is tenderness. There is no rebound.  RLQ, LLQ, suprapubic tenderness  Genitourinary:  Normal external genitalia. Minimal white thin discharge. No CMT. No  uterine or adnexal tenderness.   Musculoskeletal: She exhibits no edema.  Neurological: She is alert and oriented to person, place, and time.  Skin: Skin is warm and dry.  Psychiatric: She has a normal mood and affect. Her behavior is normal.    ED Course  Procedures (including critical care time) Labs Review Labs Reviewed  WET PREP, GENITAL - Abnormal; Notable for the following:    Clue Cells Wet Prep HPF POC MODERATE (*)    All other components within normal limits  URINALYSIS, ROUTINE W REFLEX MICROSCOPIC - Abnormal; Notable for the following:    Color, Urine RED (*)    APPearance TURBID (*)    Hgb urine dipstick LARGE (*)    Protein, ur 100 (*)    Leukocytes, UA LARGE (*)    All other components within normal limits  URINE CULTURE  GC/CHLAMYDIA PROBE AMP  URINE MICROSCOPIC-ADD ON  POC URINE PREG, ED    Imaging Review No results found.   EKG Interpretation None      MDM   Final diagnoses:  UTI (lower urinary tract infection)  BV (bacterial vaginosis)    Patient with urinary symptoms, abdominal pain, diffuse body aches, chills. She's afebrile. She is rolling around in pain, tearful upon initial evaluation. Her abdomen is soft, no peritoneal signs. UA obtained showing UTI. Rocephin 1g IM given in ED. Pelvic exam showing BV, cultures sent. Urine preg is negative. Pain treated with toradol 60mg  Im and pyridium. PT is afebrile, normal VS. Will d/c home with keflex, flagyl, home with follow up.   Filed Vitals:   08/06/13 1215 08/06/13 1230 08/06/13 1241 08/06/13 1245  BP: 116/64 119/70 119/70 110/60  Pulse: 84 84 85 80  Temp:      TempSrc:      Resp: 14 28 27 27   Height:      Weight:      SpO2: 98% 97% 99% 95%      Aleina Burgio A Meredyth Hornung, PA-C 08/06/13 1301

## 2013-08-07 LAB — URINE CULTURE: Colony Count: 40000

## 2013-08-08 LAB — GC/CHLAMYDIA PROBE AMP
CT Probe RNA: NEGATIVE
GC Probe RNA: NEGATIVE

## 2013-08-21 ENCOUNTER — Emergency Department (HOSPITAL_COMMUNITY)
Admission: EM | Admit: 2013-08-21 | Discharge: 2013-08-22 | Disposition: A | Discharge status: Home / Self Care | Payer: Self-pay | Attending: Emergency Medicine | Admitting: Emergency Medicine

## 2013-08-21 ENCOUNTER — Encounter (HOSPITAL_COMMUNITY): Payer: Self-pay | Admitting: Emergency Medicine

## 2013-08-21 DIAGNOSIS — R05 Cough: Secondary | ICD-10-CM | POA: Insufficient documentation

## 2013-08-21 DIAGNOSIS — Z8659 Personal history of other mental and behavioral disorders: Secondary | ICD-10-CM | POA: Insufficient documentation

## 2013-08-21 DIAGNOSIS — J029 Acute pharyngitis, unspecified: Secondary | ICD-10-CM | POA: Insufficient documentation

## 2013-08-21 DIAGNOSIS — J3489 Other specified disorders of nose and nasal sinuses: Secondary | ICD-10-CM | POA: Insufficient documentation

## 2013-08-21 DIAGNOSIS — Z872 Personal history of diseases of the skin and subcutaneous tissue: Secondary | ICD-10-CM | POA: Insufficient documentation

## 2013-08-21 DIAGNOSIS — Z87891 Personal history of nicotine dependence: Secondary | ICD-10-CM | POA: Insufficient documentation

## 2013-08-21 DIAGNOSIS — Z8742 Personal history of other diseases of the female genital tract: Secondary | ICD-10-CM | POA: Insufficient documentation

## 2013-08-21 DIAGNOSIS — R059 Cough, unspecified: Secondary | ICD-10-CM | POA: Insufficient documentation

## 2013-08-21 DIAGNOSIS — Z79899 Other long term (current) drug therapy: Secondary | ICD-10-CM | POA: Insufficient documentation

## 2013-08-21 NOTE — ED Notes (Signed)
Pt placed in gown and in bed. Pt monitored by 5-lead, bp cuff, and pulse ox.

## 2013-08-21 NOTE — ED Notes (Signed)
Patient here with complaint of sort throat with bilateral swelling of neck glands. States that it has been there for about 3 days, and each night it gets worse. Has been taking liquid cough medication with some relief. States no fevers at home.

## 2013-08-22 ENCOUNTER — Emergency Department (HOSPITAL_COMMUNITY): Payer: Self-pay

## 2013-08-22 LAB — RAPID STREP SCREEN (MED CTR MEBANE ONLY): Streptococcus, Group A Screen (Direct): NEGATIVE

## 2013-08-22 MED ORDER — HYDROCODONE-ACETAMINOPHEN 7.5-325 MG/15ML PO SOLN
20.0000 mL | Freq: Once | ORAL | Status: AC
  Administered 2013-08-22: 20 mL via ORAL
  Filled 2013-08-22: qty 30

## 2013-08-22 MED ORDER — DEXAMETHASONE SODIUM PHOSPHATE 10 MG/ML IJ SOLN
10.0000 mg | Freq: Once | INTRAMUSCULAR | Status: AC
Start: 2013-08-22 — End: 2013-08-22
  Administered 2013-08-22: 10 mg via INTRAMUSCULAR
  Filled 2013-08-22: qty 1

## 2013-08-22 NOTE — ED Notes (Signed)
Pt returned and monitored by 5-lead, pulse ox, and bp cuff.

## 2013-08-22 NOTE — ED Provider Notes (Signed)
CSN: [id number]     Arrival date & time 08/21/13  2139 History   First MD Initiated Contact with Patient 08/22/13 0008     Chief Complaint  Patient presents with  . Sore Throat  . Oral Swelling     (Consider location/radiation/quality/duration/timing/severity/associated sxs/prior Treatment) HPI Comments: 28 year old female with history of PCO S., bipolar, manic, kidney infection presents with worsening sore throat for the past week.  No sick contacts or known strep. No fevers or chills or vomiting. Mild swelling anterior neck bilateral. No neck stiffness. Pain with swallowing however patient able to tolerate oral liquids. No change in voice or drooling.  Patient is a 29 y.o. female presenting with pharyngitis. The history is provided by the patient.  Sore Throat Pertinent negatives include no abdominal pain, no headaches and no shortness of breath.    Past Medical History  Diagnosis Date  . Tumor of ovary   . Headache(784.0)   . Boils   . Polycystic ovarian syndrome   . Mental disorder   . Anxiety    Past Surgical History  Procedure Laterality Date  . Tumor removal     History reviewed. No pertinent family history. History  Substance Use Topics  . Smoking status: Former Smoker -- 0.25 packs/day    Types: Cigarettes  . Smokeless tobacco: Not on file  . Alcohol Use: Yes     Comment: SMOKED MARIJUANA- JULY,  COCAINE-  NONE THIS YEAR.      SMOKED LAST  AT 0030.   OB History   Grav Para Term Preterm Abortions TAB SAB Ect Mult Living   0              Review of Systems  Constitutional: Negative for fever and chills.  HENT: Positive for congestion and sore throat.   Respiratory: Positive for cough. Negative for shortness of breath.   Gastrointestinal: Negative for vomiting and abdominal pain.  Genitourinary: Negative for dysuria and flank pain.  Musculoskeletal: Negative for back pain, neck pain and neck stiffness.  Skin: Negative for rash.  Neurological: Negative for  light-headedness and headaches.      Allergies  Ibuprofen  Home Medications   Prior to Admission medications   Medication Sig Start Date End Date Taking? Authorizing Eyva Califano  Ascorbic Acid (VITAMIN C PO) Take 1 tablet by mouth daily.   Yes Historical Drago Hammonds, MD  BIOTIN PO Take 1 tablet by mouth daily.   Yes Historical Elisandro Jarrett, MD  CRANBERRY CONCENTRATE PO Take 1 tablet by mouth daily.   Yes Historical Dirk Vanaman, MD  Cyanocobalamin (VITAMIN B12 PO) Take 1 tablet by mouth daily.   Yes Historical Emry Tobin, MD  FIBER SELECT GUMMIES PO Take 2 tablets by mouth daily.   Yes Historical Franci Oshana, MD  Prenatal Vit-Fe Fumarate-FA (PRENATAL MULTIVITAMIN) TABS Take 1 tablet by mouth daily.   Yes Historical Jamorion Gomillion, MD  Thiamine HCl (THIAMINE PO) Take 1 tablet by mouth daily.   Yes Historical Gaelle Adriance, MD   BP 92/68  Pulse 57  Temp(Src) 97.9 F (36.6 C) (Oral)  Resp 17  Ht 5\' 11"  (1.803 m)  SpO2 98%  LMP 07/19/2013 Physical Exam  Nursing note and vitals reviewed. Constitutional: She is oriented to person, place, and time. She appears well-developed and well-nourished.  HENT:  Head: Normocephalic and atraumatic.  No trismus, uvular deviation, unilateral posterior pharyngeal edema or submandibular swelling.   Eyes: Right eye exhibits no discharge. Left eye exhibits no discharge.  Neck: Normal range of motion. Neck supple. No tracheal  deviation present.  Cardiovascular: Normal rate and regular rhythm.   Pulmonary/Chest: Effort normal.  Neurological: She is alert and oriented to person, place, and time.  Skin: Skin is warm. No rash noted.  Psychiatric: She has a normal mood and affect.    ED Course  Procedures (including critical care time) Labs Review Labs Reviewed  RAPID STREP SCREEN  CULTURE, GROUP A STREP    Imaging Review Dg Neck Soft Tissue  08/22/2013   CLINICAL DATA:  Sore throat.  EXAM: NECK SOFT TISSUES - 1+ VIEW  COMPARISON:  None.  FINDINGS: No prevertebral soft  tissue swelling noted. Pharyngeal airway appears patent. There is mild prominence of adenoidal tissues for age.  Epiglottis and aryepiglottic folds appear normal. Glottic structures unremarkable. Slight contour prominence of the lingual tonsils.  IMPRESSION: 1. Slight contour prominence the lingual tonsils, likely from hypertrophy. 2. Mildly prominent residual adenoidal tissue.   Electronically Signed   By: Sherryl Barters M.D.   On: 08/22/2013 00:56     EKG Interpretation None      MDM   Final diagnoses:  Acute pharyngitis, unspecified pharyngitis type    Well-appearing female with pharyngitis. x-ray lateral neck reviewed unremarkable.  No concern for abscess or emergent throat infection at this time.  Patient's symptoms improved with pain meds and Decadron given.  Patient's symptoms improved in the ER and x-ray reviewed no acute findings. No concern for epiglottitis at this time. Results and differential diagnosis were discussed with the patient/parent/guardian. Close follow up outpatient was discussed, comfortable with the plan.   Medications  dexamethasone (DECADRON) injection 10 mg (10 mg Intramuscular Given 08/22/13 0117)  HYDROcodone-acetaminophen (HYCET) 7.5-325 mg/15 ml solution 20 mL (20 mLs Oral Given 08/22/13 0118)    Filed Vitals:   08/22/13 0000 08/22/13 0015 08/22/13 0030 08/22/13 0053  BP: 113/68 123/51 95/55 92/68   Pulse: 55 64 58 57  Temp:      TempSrc:      Resp: 12 28 28 17   Height:      SpO2: 97% 99% 97% 98%       Mariea Clonts, MD 08/22/13 6568

## 2013-08-22 NOTE — Discharge Instructions (Signed)
If you were given medicines take as directed.  If you are on coumadin or contraceptives realize their levels and effectiveness is altered by many different medicines.  If you have any reaction (rash, tongues swelling, other) to the medicines stop taking and see a physician.   Please follow up as directed and return to the ER or see a physician for new or worsening symptoms.  Thank you. Filed Vitals:   08/22/13 0015 08/22/13 0030 08/22/13 0053 08/22/13 0148  BP: 123/51 95/55 92/68  114/63  Pulse: 64 58 57 54  Temp:      TempSrc:      Resp: 28 28 17 15   Height:      SpO2: 99% 97% 98% 96%

## 2013-08-24 LAB — CULTURE, GROUP A STREP

## 2013-08-28 ENCOUNTER — Inpatient Hospital Stay (HOSPITAL_COMMUNITY)
Admission: AD | Admit: 2013-08-28 | Discharge: 2013-08-28 | Disposition: A | Discharge status: Home / Self Care | Payer: Self-pay | Source: Ambulatory Visit | Attending: Obstetrics & Gynecology | Admitting: Obstetrics & Gynecology

## 2014-01-22 ENCOUNTER — Emergency Department (HOSPITAL_COMMUNITY)
Admission: EM | Admit: 2014-01-22 | Discharge: 2014-01-23 | Disposition: A | Discharge status: Home / Self Care | Payer: Self-pay | Attending: Emergency Medicine | Admitting: Emergency Medicine

## 2014-01-22 ENCOUNTER — Encounter (HOSPITAL_COMMUNITY): Payer: Self-pay

## 2014-01-22 DIAGNOSIS — Z79899 Other long term (current) drug therapy: Secondary | ICD-10-CM | POA: Insufficient documentation

## 2014-01-22 DIAGNOSIS — Z8742 Personal history of other diseases of the female genital tract: Secondary | ICD-10-CM | POA: Insufficient documentation

## 2014-01-22 DIAGNOSIS — Z8659 Personal history of other mental and behavioral disorders: Secondary | ICD-10-CM | POA: Insufficient documentation

## 2014-01-22 DIAGNOSIS — N390 Urinary tract infection, site not specified: Secondary | ICD-10-CM | POA: Insufficient documentation

## 2014-01-22 DIAGNOSIS — Z8639 Personal history of other endocrine, nutritional and metabolic disease: Secondary | ICD-10-CM | POA: Insufficient documentation

## 2014-01-22 DIAGNOSIS — Z3202 Encounter for pregnancy test, result negative: Secondary | ICD-10-CM | POA: Insufficient documentation

## 2014-01-22 DIAGNOSIS — Z87891 Personal history of nicotine dependence: Secondary | ICD-10-CM | POA: Insufficient documentation

## 2014-01-22 LAB — URINALYSIS, ROUTINE W REFLEX MICROSCOPIC
BILIRUBIN URINE: NEGATIVE
Glucose, UA: NEGATIVE mg/dL
Ketones, ur: NEGATIVE mg/dL
NITRITE: NEGATIVE
Protein, ur: NEGATIVE mg/dL
Specific Gravity, Urine: 1.01 (ref 1.005–1.030)
UROBILINOGEN UA: 0.2 mg/dL (ref 0.0–1.0)
pH: 6.5 (ref 5.0–8.0)

## 2014-01-22 LAB — COMPREHENSIVE METABOLIC PANEL
ALT: 11 U/L (ref 0–35)
ANION GAP: 10 (ref 5–15)
AST: 16 U/L (ref 0–37)
Albumin: 3.3 g/dL — ABNORMAL LOW (ref 3.5–5.2)
Alkaline Phosphatase: 59 U/L (ref 39–117)
BUN: 8 mg/dL (ref 6–23)
CALCIUM: 9.3 mg/dL (ref 8.4–10.5)
CO2: 26 meq/L (ref 19–32)
Chloride: 105 mEq/L (ref 96–112)
Creatinine, Ser: 0.99 mg/dL (ref 0.50–1.10)
GFR calc non Af Amer: 76 mL/min — ABNORMAL LOW (ref >90)
GFR, EST AFRICAN AMERICAN: 88 mL/min — AB (ref >90)
GLUCOSE: 99 mg/dL (ref 70–99)
Potassium: 4 mEq/L (ref 3.7–5.3)
SODIUM: 141 meq/L (ref 137–147)
TOTAL PROTEIN: 6.8 g/dL (ref 6.0–8.3)
Total Bilirubin: 0.2 mg/dL — ABNORMAL LOW (ref 0.3–1.2)

## 2014-01-22 LAB — URINE MICROSCOPIC-ADD ON

## 2014-01-22 LAB — CBC WITH DIFFERENTIAL/PLATELET
Basophils Absolute: 0 10*3/uL (ref 0.0–0.1)
Basophils Relative: 0 % (ref 0–1)
EOS ABS: 0.3 10*3/uL (ref 0.0–0.7)
EOS PCT: 3 % (ref 0–5)
HEMATOCRIT: 37.8 % (ref 36.0–46.0)
HEMOGLOBIN: 13 g/dL (ref 12.0–15.0)
LYMPHS ABS: 2.1 10*3/uL (ref 0.7–4.0)
LYMPHS PCT: 22 % (ref 12–46)
MCH: 32.7 pg (ref 26.0–34.0)
MCHC: 34.4 g/dL (ref 30.0–36.0)
MCV: 95 fL (ref 78.0–100.0)
MONO ABS: 0.9 10*3/uL (ref 0.1–1.0)
MONOS PCT: 9 % (ref 3–12)
Neutro Abs: 6.3 10*3/uL (ref 1.7–7.7)
Neutrophils Relative %: 66 % (ref 43–77)
PLATELETS: 246 10*3/uL (ref 150–400)
RBC: 3.98 MIL/uL (ref 3.87–5.11)
RDW: 12.4 % (ref 11.5–15.5)
WBC: 9.5 10*3/uL (ref 4.0–10.5)

## 2014-01-22 LAB — WET PREP, GENITAL
Trich, Wet Prep: NONE SEEN
Yeast Wet Prep HPF POC: NONE SEEN

## 2014-01-22 LAB — POC URINE PREG, ED: PREG TEST UR: NEGATIVE

## 2014-01-22 MED ORDER — MORPHINE SULFATE 4 MG/ML IJ SOLN
4.0000 mg | Freq: Once | INTRAMUSCULAR | Status: AC
  Administered 2014-01-22: 4 mg via INTRAVENOUS
  Filled 2014-01-22: qty 1

## 2014-01-22 MED ORDER — ONDANSETRON HCL 4 MG/2ML IJ SOLN
4.0000 mg | Freq: Once | INTRAMUSCULAR | Status: AC
  Administered 2014-01-22: 4 mg via INTRAVENOUS
  Filled 2014-01-22: qty 2

## 2014-01-22 MED ORDER — DEXTROSE 5 % IV SOLN
1.0000 g | Freq: Once | INTRAVENOUS | Status: AC
  Administered 2014-01-22: 1 g via INTRAVENOUS
  Filled 2014-01-22: qty 10

## 2014-01-22 NOTE — ED Notes (Signed)
Pt presents with c/o abdominal pain that started last night. Pt reports she has also been having some problems with her menstrual cycle for the past several months, reports her periods have been dark and "mucousy". Pt reports some discomfort with urination and urgency as well.

## 2014-01-22 NOTE — ED Notes (Signed)
Awake. Verbally responsive. Resp even and unlabored. ABC's intact. BS (+) x4 quadrants. Abd soft/nondistended/tender to touch. Reported nausea without vomiting. LBM 01/22/2014.

## 2014-01-22 NOTE — ED Notes (Signed)
Awake. Verbally responsive. Resp even and unlabored. ABC's intact. IV SL patent and intact.

## 2014-01-22 NOTE — ED Provider Notes (Signed)
CSN: [id number]     Arrival date & time 01/22/14  2057 History   First MD Initiated Contact with Patient 01/22/14 2124     Chief Complaint  Patient presents with  . Abdominal Pain     (Consider location/radiation/quality/duration/timing/severity/associated sxs/prior Treatment) HPI   Patient presents to the ED for evaluation of abdominal pain that started last night. She has a hx of tumor of the ovary, boils, PCOS, mental disorder, and anxiety.  She reports having menstrual "problems" or irregular spotting over the past few months. She denies ever having normal periods in the past however. The pain is in her pelvic region. She has also had some mild dysuria with urgency. She has not had fevers, nausea, vomiting, diarrhea. She denies feeling weak or confused.  Past Medical History  Diagnosis Date  . Tumor of ovary   . Headache(784.0)   . Boils   . Polycystic ovarian syndrome   . Mental disorder   . Anxiety    Past Surgical History  Procedure Laterality Date  . Tumor removal     No family history on file. History  Substance Use Topics  . Smoking status: Former Smoker -- 0.25 packs/day    Types: Cigarettes  . Smokeless tobacco: Not on file  . Alcohol Use: No   OB History    Gravida Para Term Preterm AB TAB SAB Ectopic Multiple Living   0              Review of Systems  10 Systems reviewed and are negative for acute change except as noted in the HPI.    Allergies  Ibuprofen  Home Medications   Prior to Admission medications   Medication Sig Start Date End Date Taking? Authorizing Provider  Ascorbic Acid (VITAMIN C PO) Take 1 tablet by mouth daily.   Yes Historical Provider, MD  BIOTIN PO Take 1 tablet by mouth daily.   Yes Historical Provider, MD  CRANBERRY CONCENTRATE PO Take 1 tablet by mouth daily.   Yes Historical Provider, MD  Cyanocobalamin (VITAMIN B12 PO) Take 1 tablet by mouth daily.   Yes Historical Provider, MD  Prenatal Vit-Fe Fumarate-FA (PRENATAL  MULTIVITAMIN) TABS Take 1 tablet by mouth daily.   Yes Historical Provider, MD  Thiamine HCl (THIAMINE PO) Take 1 tablet by mouth daily.   Yes Historical Provider, MD  ciprofloxacin (CIPRO) 500 MG tablet Take 1 tablet (500 mg total) by mouth 2 (two) times daily. 01/23/14   Nesha Counihan Marilu Favre, PA-C  FIBER SELECT GUMMIES PO Take 2 tablets by mouth daily.    Historical Provider, MD  HYDROcodone-acetaminophen (NORCO/VICODIN) 5-325 MG per tablet Take 1 tablet by mouth every 4 (four) hours as needed for moderate pain or severe pain. 01/23/14   Sharine Cadle Marilu Favre, PA-C  ondansetron (ZOFRAN) 4 MG tablet Take 1 tablet (4 mg total) by mouth every 6 (six) hours. 01/23/14   Blair Lundeen Marilu Favre, PA-C   BP 110/70 mmHg  Pulse 63  Temp(Src) 97.8 F (36.6 C) (Oral)  Resp 18  SpO2 100%  LMP 01/20/2014 Physical Exam  Constitutional: She appears well-developed and well-nourished. No distress.  HENT:  Head: Normocephalic and atraumatic.  Eyes: Pupils are equal, round, and reactive to light.  Neck: Normal range of motion. Neck supple.  Cardiovascular: Normal rate and regular rhythm.   Pulmonary/Chest: Effort normal.  Abdominal: Soft. Bowel sounds are normal. She exhibits no distension and no fluid wave. There is tenderness in the suprapubic area. There is guarding (voluntary). There is  no rigidity, no rebound and no CVA tenderness.  Genitourinary: There is no rash or tenderness on the right labia. There is no rash or tenderness on the left labia. Uterus is tender (tender to the bladder and uterus). Cervix exhibits no motion tenderness, no discharge and no friability. There is tenderness in the vagina. No bleeding in the vagina. No vaginal discharge found.  Neurological: She is alert.  Skin: Skin is warm and dry.  Nursing note and vitals reviewed.   ED Course  Procedures (including critical care time) Labs Review Labs Reviewed  WET PREP, GENITAL - Abnormal; Notable for the following:    Clue Cells Wet Prep HPF  POC RARE (*)    WBC, Wet Prep HPF POC RARE (*)    All other components within normal limits  COMPREHENSIVE METABOLIC PANEL - Abnormal; Notable for the following:    Albumin 3.3 (*)    Total Bilirubin 0.2 (*)    GFR calc non Af Amer 76 (*)    GFR calc Af Amer 88 (*)    All other components within normal limits  URINALYSIS, ROUTINE W REFLEX MICROSCOPIC - Abnormal; Notable for the following:    APPearance CLOUDY (*)    Hgb urine dipstick TRACE (*)    Leukocytes, UA LARGE (*)    All other components within normal limits  URINE MICROSCOPIC-ADD ON - Abnormal; Notable for the following:    Bacteria, UA FEW (*)    All other components within normal limits  GC/CHLAMYDIA PROBE AMP  URINE CULTURE  CBC WITH DIFFERENTIAL  POC URINE PREG, ED    Imaging Review No results found.   EKG Interpretation None      MDM   Final diagnoses:  UTI (lower urinary tract infection)    Patients wet prep is unremarkable- GC cultures pending. Neg urine preg. Normal CBC. Her renal function is decreased for her age but it is at her baseline per chart review.   Pt given an IV gram of Rocephin in the ED. Will be discharged with 2 weeks of Cipro PO abx. Pain and nausea medications. No systemic symptoms to warrant admission or flank pain, fevers, nausea, vomiting, diarrhea, abnormal vital signs.  28 y.o.Arley Phenix Bobbitt's evaluation in the Emergency Department is complete. It has been determined that no acute conditions requiring further emergency intervention are present at this time. The patient/guardian have been advised of the diagnosis and plan. We have discussed signs and symptoms that warrant return to the ED, such as changes or worsening in symptoms.  Vital signs are stable at discharge. Filed Vitals:   01/22/14 2330  BP: 110/70  Pulse: 63  Temp:   Resp: 18    Patient/guardian has voiced understanding and agreed to follow-up with the PCP or specialist.     Linus Mako,  PA-C 01/23/14 0010  Mariea Clonts, MD 01/25/14 [phone]

## 2014-01-23 MED ORDER — ONDANSETRON HCL 4 MG PO TABS: 4.0000 mg | ORAL_TABLET | Freq: Four times a day (QID) | ORAL | Status: DC

## 2014-01-23 MED ORDER — HYDROCODONE-ACETAMINOPHEN 5-325 MG PO TABS: 1.0000 | ORAL_TABLET | ORAL | Status: DC | PRN

## 2014-01-23 MED ORDER — CIPROFLOXACIN HCL 500 MG PO TABS: 500.0000 mg | ORAL_TABLET | Freq: Two times a day (BID) | ORAL | Status: DC

## 2014-01-23 NOTE — Discharge Instructions (Signed)

## 2014-01-23 NOTE — ED Notes (Signed)
Awake. Verbally responsive. Resp even and unlabored. ABC's intact. NAD noted.

## 2014-01-24 LAB — GC/CHLAMYDIA PROBE AMP
CT Probe RNA: NEGATIVE
GC Probe RNA: NEGATIVE

## 2014-01-27 LAB — URINE CULTURE
Colony Count: >=100000
SPECIAL REQUESTS: NORMAL

## 2014-01-30 ENCOUNTER — Telehealth (HOSPITAL_COMMUNITY): Payer: Self-pay

## 2014-01-30 NOTE — Telephone Encounter (Signed)
Post ED Visit - Positive Culture Follow-up  Culture report reviewed by antimicrobial stewardship pharmacist: []  [place], Pharm.D., BCPS []  Heide Guile, Pharm.D., BCPS []  Alycia Rossetti, Pharm.D., BCPS [x]  Omega, Pharm.D., BCPS, AAHIVP []  Legrand Como, Pharm.D., BCPS, AAHIVP []  Elicia Lamp, Pharm.D.   Positive Urine culture Treated with Ciprofloxacin, organism sensitive to the same and no further patient follow-up is required at this time.  Dortha Kern 01/30/2014, 5:49 AM

## 2014-02-18 ENCOUNTER — Emergency Department (HOSPITAL_COMMUNITY)
Admission: EM | Admit: 2014-02-18 | Discharge: 2014-02-19 | Disposition: A | Discharge status: Home / Self Care | Payer: Self-pay | Attending: Emergency Medicine | Admitting: Emergency Medicine

## 2014-02-18 ENCOUNTER — Encounter (HOSPITAL_COMMUNITY): Payer: Self-pay | Admitting: Emergency Medicine

## 2014-02-18 DIAGNOSIS — Z872 Personal history of diseases of the skin and subcutaneous tissue: Secondary | ICD-10-CM | POA: Insufficient documentation

## 2014-02-18 DIAGNOSIS — Z79899 Other long term (current) drug therapy: Secondary | ICD-10-CM | POA: Insufficient documentation

## 2014-02-18 DIAGNOSIS — Z3202 Encounter for pregnancy test, result negative: Secondary | ICD-10-CM | POA: Insufficient documentation

## 2014-02-18 DIAGNOSIS — Z87891 Personal history of nicotine dependence: Secondary | ICD-10-CM | POA: Insufficient documentation

## 2014-02-18 DIAGNOSIS — Z792 Long term (current) use of antibiotics: Secondary | ICD-10-CM | POA: Insufficient documentation

## 2014-02-18 DIAGNOSIS — Z9889 Other specified postprocedural states: Secondary | ICD-10-CM | POA: Insufficient documentation

## 2014-02-18 DIAGNOSIS — R102 Pelvic and perineal pain: Secondary | ICD-10-CM | POA: Insufficient documentation

## 2014-02-18 DIAGNOSIS — R1110 Vomiting, unspecified: Secondary | ICD-10-CM | POA: Insufficient documentation

## 2014-02-18 DIAGNOSIS — Z8742 Personal history of other diseases of the female genital tract: Secondary | ICD-10-CM | POA: Insufficient documentation

## 2014-02-18 DIAGNOSIS — Z8639 Personal history of other endocrine, nutritional and metabolic disease: Secondary | ICD-10-CM | POA: Insufficient documentation

## 2014-02-18 DIAGNOSIS — F419 Anxiety disorder, unspecified: Secondary | ICD-10-CM | POA: Insufficient documentation

## 2014-02-18 LAB — URINE MICROSCOPIC-ADD ON

## 2014-02-18 LAB — URINALYSIS, ROUTINE W REFLEX MICROSCOPIC
Bilirubin Urine: NEGATIVE
Glucose, UA: NEGATIVE mg/dL
Ketones, ur: NEGATIVE mg/dL
LEUKOCYTES UA: NEGATIVE
NITRITE: NEGATIVE
PROTEIN: NEGATIVE mg/dL
Specific Gravity, Urine: 1.021 (ref 1.005–1.030)
UROBILINOGEN UA: 1 mg/dL (ref 0.0–1.0)
pH: 6 (ref 5.0–8.0)

## 2014-02-18 LAB — PREGNANCY, URINE: PREG TEST UR: NEGATIVE

## 2014-02-18 LAB — WET PREP, GENITAL
Trich, Wet Prep: NONE SEEN
YEAST WET PREP: NONE SEEN

## 2014-02-18 NOTE — ED Notes (Signed)
[place] , per EMS pt co abdominal pain for 2 hours, vomiting. Hx of dysmenrhoea . BP 142/92 HR 68 RR 16

## 2014-02-18 NOTE — ED Provider Notes (Signed)
CSN: [id number]     Arrival date & time 02/18/14  2102 History   First MD Initiated Contact with Patient 02/18/14 2137     Chief Complaint  Patient presents with  . Abdominal Pain     (Consider location/radiation/quality/duration/timing/severity/associated sxs/prior Treatment) HPI   [name] [name] is a 28 y.o. female who presents for evaluation of lower abdominal pain which started this evening, while she was at a church Christmas party.  After that she began vomiting.  She has had a regular menses recently, with the last cycle being 01/20/2014.  She denies vaginal discharge, or bleeding.  She denies fever, chills, cough, shortness of breath or chest pain.  She came here by EMS.  There are no other known modifying factors.   Past Medical History  Diagnosis Date  . Tumor of ovary   . Headache(784.0)   . Boils   . Polycystic ovarian syndrome   . Mental disorder   . Anxiety    Past Surgical History  Procedure Laterality Date  . Tumor removal     History reviewed. No pertinent family history. History  Substance Use Topics  . Smoking status: Former Smoker -- 0.25 packs/day    Types: Cigarettes  . Smokeless tobacco: Not on file  . Alcohol Use: No   OB History    Gravida Para Term Preterm AB TAB SAB Ectopic Multiple Living   0              Review of Systems  All other systems reviewed and are negative.     Allergies  Ibuprofen  Home Medications   Prior to Admission medications   Medication Sig Start Date End Date Taking? Authorizing Provider  acetaminophen (TYLENOL) 500 MG tablet Take 1,000 mg by mouth every 6 (six) hours as needed for moderate pain (pain).   Yes Historical Provider, MD  escitalopram (LEXAPRO) 10 MG tablet Take 10 mg by mouth daily.   Yes Historical Provider, MD  ciprofloxacin (CIPRO) 500 MG tablet Take 1 tablet (500 mg total) by mouth 2 (two) times daily. Patient not taking: Reported on 02/18/2014 01/23/14   Linus Mako, PA-C   Cyanocobalamin (VITAMIN B12 PO) Take 1 tablet by mouth daily.    Historical Provider, MD  HYDROcodone-acetaminophen (NORCO/VICODIN) 5-325 MG per tablet Take 1 tablet by mouth every 4 (four) hours as needed for moderate pain or severe pain. Patient not taking: Reported on 02/18/2014 01/23/14   Linus Mako, PA-C  ondansetron (ZOFRAN) 4 MG tablet Take 1 tablet (4 mg total) by mouth every 6 (six) hours. Patient not taking: Reported on 02/18/2014 01/23/14   Linus Mako, PA-C   BP 122/72 mmHg  Pulse 70  Temp(Src) 97.5 F (36.4 C) (Oral)  Resp 22  Ht 5\' 11"  (1.803 m)  Wt 198 lb (89.812 kg)  BMI 27.63 kg/m2  SpO2 99%  LMP 01/20/2014 Physical Exam  Constitutional: She is oriented to person, place, and time. She appears well-developed and well-nourished.  HENT:  Head: Normocephalic and atraumatic.  Right Ear: External ear normal.  Left Ear: External ear normal.  Eyes: Conjunctivae and EOM are normal. Pupils are equal, round, and reactive to light.  Neck: Normal range of motion and phonation normal. Neck supple.  Cardiovascular: Normal rate, regular rhythm and normal heart sounds.   Pulmonary/Chest: Effort normal and breath sounds normal. She exhibits no bony tenderness.  Abdominal: Soft. There is tenderness (Mid lower abdomen, mild). There is no rebound and no guarding.  Genitourinary:  Normal external female genitalia.  Small amount of blood in the posterior vagina.  Cervix appears normal.  On bimanual examination is no uterine enlargement, uterine tenderness, adnexal mass or adnexal tenderness.  Musculoskeletal: Normal range of motion.  Neurological: She is alert and oriented to person, place, and time. No cranial nerve deficit or sensory deficit. She exhibits normal muscle tone. Coordination normal.  Skin: Skin is warm, dry and intact.  Psychiatric: She has a normal mood and affect. Her behavior is normal. Judgment and thought content normal.  Nursing note and vitals  reviewed.   ED Course  Procedures (including critical care time)  Medications - No data to display  Patient Vitals for the past 24 hrs:  BP Temp Temp src Pulse Resp SpO2 Height Weight  02/18/14 2103 122/72 mmHg 97.5 F (36.4 C) Oral 70 22 99 % 5\' 11"  (1.803 m) 198 lb (89.812 kg)    11:40 PM Reevaluation with update and discussion. After initial assessment and treatment, an updated evaluation reveals she is comfortable, and feels better. Findings discussed with patient, all questions answered.Daleen Bo L     Labs Review Labs Reviewed  WET PREP, GENITAL - Abnormal; Notable for the following:    Clue Cells Wet Prep HPF POC RARE (*)    WBC, Wet Prep HPF POC MANY (*)    All other components within normal limits  URINALYSIS, ROUTINE W REFLEX MICROSCOPIC - Abnormal; Notable for the following:    APPearance CLOUDY (*)    Hgb urine dipstick MODERATE (*)    All other components within normal limits  URINE CULTURE  GC/CHLAMYDIA PROBE AMP  PREGNANCY, URINE  URINE MICROSCOPIC-ADD ON  RPR  HIV ANTIBODY (ROUTINE TESTING)    Imaging Review No results found.   EKG Interpretation None      MDM   Final diagnoses:  Pelvic pain in female    Specific pelvic pain with findings normal with the exception of increased white cells in the vaginal wet prep.  Patient does not currently have a discharge.  Blood in vagina is likely related to menses.  Doubt PID, UTI or serious bacterial infection.  She likely has dysmenorrhea.  Nursing Notes Reviewed/ Care Coordinated Applicable Imaging Reviewed Interpretation of Laboratory Data incorporated into ED treatment  The patient appears reasonably screened and/or stabilized for discharge and I doubt any other medical condition or other [facility] requiring further screening, evaluation, or treatment in the ED at this time prior to discharge.  Plan: Home Medications- OTC analgesia; Home Treatments- rest ; return here if the recommended treatment,  does not improve the symptoms; Recommended follow up- PCP prn    Richarda Blade, MD 02/18/14 2342

## 2014-02-18 NOTE — ED Notes (Signed)
Bed: QV67 Expected date: 02/18/14 Expected time: 8:49 PM Means of arrival: Ambulance Comments: Bed 11, EMS, 28 F, ABD Pain

## 2014-02-18 NOTE — ED Notes (Signed)
Patient stated that she would like to have a blood test to see if she is pregnant.

## 2014-02-18 NOTE — Discharge Instructions (Signed)
Take Tylenol, or Motrin, as needed for pain. Start with clear liquids tonight, then bland foods tomorrow until you feel better. Follow-up with the doctor of your choice if not better in 2 or 3 days.  Abdominal Pain Many things can cause abdominal pain. Usually, abdominal pain is not caused by a disease and will improve without treatment. It can often be observed and treated at home. Your health care provider will do a physical exam and possibly order blood tests and X-rays to help determine the seriousness of your pain. However, in many cases, more time must pass before a clear cause of the pain can be found. Before that point, your health care provider may not know if you need more testing or further treatment. HOME CARE INSTRUCTIONS  Monitor your abdominal pain for any changes. The following actions may help to alleviate any discomfort you are experiencing:  Only take over-the-counter or prescription medicines as directed by your health care provider.  Do not take laxatives unless directed to do so by your health care provider.  Try a clear liquid diet (broth, tea, or water) as directed by your health care provider. Slowly move to a bland diet as tolerated. SEEK MEDICAL CARE IF:  You have unexplained abdominal pain.  You have abdominal pain associated with nausea or diarrhea.  You have pain when you urinate or have a bowel movement.  You experience abdominal pain that wakes you in the night.  You have abdominal pain that is worsened or improved by eating food.  You have abdominal pain that is worsened with eating fatty foods.  You have a fever. SEEK IMMEDIATE MEDICAL CARE IF:   Your pain does not go away within 2 hours.  You keep throwing up (vomiting).  Your pain is felt only in portions of the abdomen, such as the right side or the left lower portion of the abdomen.  You pass bloody or black tarry stools. MAKE SURE YOU:  Understand these instructions.   Will watch your  condition.   Will get help right away if you are not doing well or get worse.  Document Released: 11/27/2004 Document Revised: 02/22/2013 Document Reviewed: 10/27/2012 [facility] Patient Information 2015 [place], [place]. This information is not intended to replace advice given to you by your health care provider. Make sure you discuss any questions you have with your health care provider.

## 2014-02-19 LAB — RPR

## 2014-02-19 LAB — HIV ANTIBODY (ROUTINE TESTING W REFLEX): HIV: NONREACTIVE

## 2014-02-20 LAB — URINE CULTURE
Colony Count: NO GROWTH
Culture: NO GROWTH
Special Requests: NORMAL

## 2014-02-20 LAB — GC/CHLAMYDIA PROBE AMP
CT PROBE, AMP APTIMA: NEGATIVE
GC PROBE AMP APTIMA: NEGATIVE

## 2014-04-02 ENCOUNTER — Encounter (HOSPITAL_COMMUNITY): Payer: Self-pay | Admitting: Emergency Medicine

## 2014-04-02 ENCOUNTER — Emergency Department (HOSPITAL_COMMUNITY)
Admission: EM | Admit: 2014-04-02 | Discharge: 2014-04-02 | Disposition: A | Discharge status: Home / Self Care | Payer: Self-pay | Attending: Emergency Medicine | Admitting: Emergency Medicine

## 2014-04-02 DIAGNOSIS — Z87891 Personal history of nicotine dependence: Secondary | ICD-10-CM | POA: Insufficient documentation

## 2014-04-02 DIAGNOSIS — Z3202 Encounter for pregnancy test, result negative: Secondary | ICD-10-CM | POA: Insufficient documentation

## 2014-04-02 DIAGNOSIS — Z792 Long term (current) use of antibiotics: Secondary | ICD-10-CM | POA: Insufficient documentation

## 2014-04-02 DIAGNOSIS — R350 Frequency of micturition: Secondary | ICD-10-CM | POA: Insufficient documentation

## 2014-04-02 DIAGNOSIS — Z872 Personal history of diseases of the skin and subcutaneous tissue: Secondary | ICD-10-CM | POA: Insufficient documentation

## 2014-04-02 DIAGNOSIS — Z8659 Personal history of other mental and behavioral disorders: Secondary | ICD-10-CM | POA: Insufficient documentation

## 2014-04-02 DIAGNOSIS — R102 Pelvic and perineal pain: Secondary | ICD-10-CM

## 2014-04-02 DIAGNOSIS — Z8639 Personal history of other endocrine, nutritional and metabolic disease: Secondary | ICD-10-CM | POA: Insufficient documentation

## 2014-04-02 DIAGNOSIS — G8929 Other chronic pain: Secondary | ICD-10-CM | POA: Insufficient documentation

## 2014-04-02 LAB — URINALYSIS, ROUTINE W REFLEX MICROSCOPIC
BILIRUBIN URINE: NEGATIVE
Glucose, UA: NEGATIVE mg/dL
Hgb urine dipstick: NEGATIVE
Ketones, ur: NEGATIVE mg/dL
Nitrite: NEGATIVE
Protein, ur: 30 mg/dL — AB
Specific Gravity, Urine: 1.029 (ref 1.005–1.030)
Urobilinogen, UA: 1 mg/dL (ref 0.0–1.0)
pH: 7.5 (ref 5.0–8.0)

## 2014-04-02 LAB — I-STAT CHEM 8, ED
BUN: 9 mg/dL (ref 6–23)
CALCIUM ION: 1.17 mmol/L (ref 1.12–1.23)
CREATININE: 0.9 mg/dL (ref 0.50–1.10)
Chloride: 108 mmol/L (ref 96–112)
Glucose, Bld: 84 mg/dL (ref 70–99)
HEMATOCRIT: 38 % (ref 36.0–46.0)
Hemoglobin: 12.9 g/dL (ref 12.0–15.0)
POTASSIUM: 3.5 mmol/L (ref 3.5–5.1)
SODIUM: 142 mmol/L (ref 135–145)
TCO2: 21 mmol/L (ref 0–100)

## 2014-04-02 LAB — URINE MICROSCOPIC-ADD ON

## 2014-04-02 LAB — PREGNANCY, URINE: Preg Test, Ur: NEGATIVE

## 2014-04-02 LAB — WET PREP, GENITAL: TRICH WET PREP: NONE SEEN

## 2014-04-02 MED ORDER — CEPHALEXIN 500 MG PO CAPS: 500.0000 mg | ORAL_CAPSULE | Freq: Two times a day (BID) | ORAL | Status: DC

## 2014-04-02 MED ORDER — PHENAZOPYRIDINE HCL 200 MG PO TABS: 200.0000 mg | ORAL_TABLET | Freq: Three times a day (TID) | ORAL | Status: DC

## 2014-04-02 NOTE — Discharge Instructions (Signed)
Recommend that you follow-up with a urologist given your frequent urinary tract infection diagnoses. Your urine is sent for culture at this time. Follow up on the results of your gonorrhea and chlamydia cultures with the health department. Take Keflex as prescribed as your urine did suggest potential infection today. Take Pyridium as prescribed for pain. Return to the emergency department as needed if symptoms worsen.  Urinary Frequency The number of times a normal person urinates depends upon how much liquid they take in and how much liquid they are losing. If the temperature is hot and there is high humidity, then the person will sweat more and usually breathe a little more frequently. These factors decrease the amount of frequency of urination that would be considered normal. The amount you drink is easily determined, but the amount of fluid lost is sometimes more difficult to calculate.  Fluid is lost in two ways:  Sensible fluid loss is usually measured by the amount of urine that you get rid of. Losses of fluid can also occur with diarrhea.  Insensible fluid loss is more difficult to measure. It is caused by evaporation. Insensible loss of fluid occurs through breathing and sweating. It usually ranges from a little less than a quart to a little more than a quart of fluid a day. In normal temperatures and activity levels, the average person may urinate 4 to 7 times in a 24-hour period. Needing to urinate more often than that could indicate a problem. If one urinates 4 to 7 times in 24 hours and has large volumes each time, that could indicate a different problem from one who urinates 4 to 7 times a day and has small volumes. The time of urinating is also important. Most urinating should be done during the waking hours. Getting up at night to urinate frequently can indicate some problems. CAUSES  The bladder is the organ in your lower abdomen that holds urine. Like a balloon, it swells some as it  fills up. Your nerves sense this and tell you it is time to head for the bathroom. There are a number of reasons that you might feel the need to urinate more often than usual. They include:  Urinary tract infection. This is usually associated with other signs such as burning when you urinate.  In men, problems with the prostate (a walnut-size gland that is located near the tube that carries urine out of your body). There are two reasons why the prostate can cause an increased frequency of urination:  An enlarged prostate that does not let the bladder empty well. If the bladder only half empties when you urinate, then it only has half the capacity to fill before you have to urinate again.  The nerves in the bladder become more hypersensitive with an increased size of the prostate even if the bladder empties completely.  Pregnancy.  Obesity. Excess weight is more likely to cause a problem for women than for men.  Bladder stones or other bladder problems.  Caffeine.  Alcohol.  Medications. For example, drugs that help the body get rid of extra fluid (diuretics) increase urine production. Some other medicines must be taken with lots of fluids.  Muscle or nerve weakness. This might be the result of a spinal cord injury, a stroke, multiple sclerosis, or Parkinson disease.  Long-standing diabetes can decrease the sensation of the bladder. This loss of sensation makes it harder to sense the bladder needs to be emptied. Over a period of years, the bladder  is stretched out by constant overfilling. This weakens the bladder muscles so that the bladder does not empty well and has less capacity to fill with new urine.  Interstitial cystitis (also called painful bladder syndrome). This condition develops because the tissues that line the inside of the bladder are inflamed (inflammation is the body's way of reacting to injury or infection). It causes pain and frequent urination. It occurs in women more  often than in men. DIAGNOSIS   To decide what might be causing your urinary frequency, your health care provider will probably:  Ask about symptoms you have noticed.  Ask about your overall health. This will include questions about any medications you are taking.  Do a physical examination.  Order some tests. These might include:  A blood test to check for diabetes or other health issues that could be contributing to the problem.  Urine testing. This could measure the flow of urine and the pressure on the bladder.  A test of your neurological system (the brain, spinal cord, and nerves). This is the system that senses the need to urinate.  A bladder test to check whether it is emptying completely when you urinate.  Cystoscopy. This test uses a thin tube with a tiny camera on it. It offers a look inside your urethra and bladder to see if there are problems.  Imaging tests. You might be given a contrast dye and then asked to urinate. X-rays are taken to see how your bladder is working. TREATMENT  It is important for you to be evaluated to determine if the amount or frequency that you have is unusual or abnormal. If it is found to be abnormal, the cause should be determined and this can usually be found out easily. Depending upon the cause, treatment could include medication, stimulation of the nerves, or surgery. There are not too many things that you can do as an individual to change your urinary frequency. It is important that you balance the amount of fluid intake needed to compensate for your activity and the temperature. Medical problems will be diagnosed and taken care of by your physician. There is no particular bladder training such as Kegel exercises that you can do to help urinary frequency. This is an exercise that is usually recommended for people who have leaking of urine when they laugh, cough, or sneeze. HOME CARE INSTRUCTIONS   Take any medications your health care provider  prescribed or suggested. Follow the directions carefully.  Practice any lifestyle changes that are recommended. These might include:  Drinking less fluid or drinking at different times of the day. If you need to urinate often during the night, for example, you may need to stop drinking fluids early in the evening.  Cutting down on caffeine or alcohol. They both can make you need to urinate more often than normal. Caffeine is found in coffee, tea, and sodas.  Losing weight, if that is recommended.  Keep a journal or a log. You might be asked to record how much you drink and when and where you feel the need to urinate. This will also help evaluate how well the treatment provided by your physician is working. SEEK MEDICAL CARE IF:   Your need to urinate often gets worse.  You feel increased pain or irritation when you urinate.  You notice blood in your urine.  You have questions about any medications that your health care provider recommended.  You notice blood, pus, or swelling at the site of any  test or treatment procedure.  You develop a fever of more than 100.33F (38.1C). SEEK IMMEDIATE MEDICAL CARE IF:  You develop a fever of more than 102.32F (38.9C). Document Released: 12/14/2008 Document Revised: 07/04/2013 Document Reviewed: 12/14/2008 [facility] Patient Information 2015 [place], [place]. This information is not intended to replace advice given to you by your health care provider. Make sure you discuss any questions you have with your health care provider.

## 2014-04-02 NOTE — ED Provider Notes (Signed)
CSN: [id number]     Arrival date & time 04/02/14  1810 History   First MD Initiated Contact with Patient 04/02/14 1858     Chief Complaint  Patient presents with  . Urinary Tract Infection    (Consider location/radiation/quality/duration/timing/severity/associated sxs/prior Treatment) HPI Comments: Patient is a 29 year old female with a history of ovarian tumor, polycystic ovarian syndrome, and anxiety who presents to the emergency department for further evaluation of urinary frequency. Patient states that she has had symptoms over the past week. She states that she has had associated decreased urinary stream and dysuria. Patient reports associated nausea as well and the sensation of bloating in her lower abdomen. Patient states that she has had these same symptoms chronically over the past year. She denies any change in her symptoms compared to prior episodes. Patient states that every time she has these symptoms she is diagnosed with a UTI and given antibiotics. Patient had leftover ciprofloxacin which she has been taking without relief. Patient denies any abdominal pain, but does report pressure when voiding. Patient had a normal bowel movement yesterday. She denies associated fever, vomiting, diarrhea, vaginal bleeding, vaginal discharge, and hematuria.  The history is provided by the patient. No language interpreter was used.    Past Medical History  Diagnosis Date  . Tumor of ovary   . Headache(784.0)   . Boils   . Polycystic ovarian syndrome   . Mental disorder   . Anxiety    Past Surgical History  Procedure Laterality Date  . Tumor removal     History reviewed. No pertinent family history. History  Substance Use Topics  . Smoking status: Former Smoker -- 0.25 packs/day    Types: Cigarettes  . Smokeless tobacco: Not on file  . Alcohol Use: No   OB History    Gravida Para Term Preterm AB TAB SAB Ectopic Multiple Living   0               Review of Systems   Constitutional: Negative for fever.  Gastrointestinal: Positive for abdominal distention ("bloating"). Negative for vomiting and diarrhea.  Genitourinary: Positive for frequency. Negative for hematuria, vaginal bleeding and vaginal discharge.  All other systems reviewed and are negative.   Allergies  Ibuprofen  Home Medications   Prior to Admission medications   Medication Sig Start Date End Date Taking? Authorizing Provider  promethazine (PHENERGAN) 25 MG tablet Take 25 mg by mouth every 6 (six) hours as needed for nausea or vomiting.   Yes Historical Provider, MD  cephALEXin (KEFLEX) 500 MG capsule Take 1 capsule (500 mg total) by mouth 2 (two) times daily. 04/02/14   Antonietta Breach, PA-C  ciprofloxacin (CIPRO) 500 MG tablet Take 1 tablet (500 mg total) by mouth 2 (two) times daily. Patient not taking: Reported on 02/18/2014 01/23/14   Linus Mako, PA-C  HYDROcodone-acetaminophen (NORCO/VICODIN) 5-325 MG per tablet Take 1 tablet by mouth every 4 (four) hours as needed for moderate pain or severe pain. Patient not taking: Reported on 02/18/2014 01/23/14   Linus Mako, PA-C  ondansetron (ZOFRAN) 4 MG tablet Take 1 tablet (4 mg total) by mouth every 6 (six) hours. Patient not taking: Reported on 02/18/2014 01/23/14   Linus Mako, PA-C  phenazopyridine (PYRIDIUM) 200 MG tablet Take 1 tablet (200 mg total) by mouth 3 (three) times daily. 04/02/14   Antonietta Breach, PA-C   BP 114/66 mmHg  Pulse 66  Temp(Src) 98.6 F (37 C) (Oral)  Resp 18  SpO2 98%  LMP 03/26/2014 (Exact Date)   Physical Exam  Constitutional: She is oriented to person, place, and time. She appears well-developed and well-nourished. No distress.  Nontoxic/nonseptic appearing  HENT:  Head: Normocephalic and atraumatic.  Eyes: Conjunctivae and EOM are normal. No scleral icterus.  Neck: Normal range of motion.  Cardiovascular: Normal rate, regular rhythm and intact distal pulses.   Pulmonary/Chest: Effort  normal and breath sounds normal. No respiratory distress. She has no wheezes.  Respirations even and unlabored.  Abdominal: Soft. She exhibits distension. There is no tenderness. There is no rebound and no guarding.  Mild bloating in the lower abdomen. No abdominal tenderness. No peritoneal signs. Abdomen soft.  Genitourinary: Vagina normal. There is no rash, tenderness, lesion or injury on the right labia. There is no rash, tenderness, lesion or injury on the left labia. Uterus is not tender. Cervix exhibits no motion tenderness, no discharge and no friability. Right adnexum displays no mass, no tenderness and no fullness. Left adnexum displays no mass, no tenderness and no fullness.  Musculoskeletal: Normal range of motion.  Neurological: She is alert and oriented to person, place, and time. She exhibits normal muscle tone. Coordination normal.  Skin: Skin is warm and dry. No rash noted. She is not diaphoretic. No erythema. No pallor.  Psychiatric: She has a normal mood and affect. Her behavior is normal.  Nursing note and vitals reviewed.   ED Course  Procedures (including critical care time) Labs Review Labs Reviewed  WET PREP, GENITAL - Abnormal; Notable for the following:    Yeast Wet Prep HPF POC RARE (*)    Clue Cells Wet Prep HPF POC RARE (*)    WBC, Wet Prep HPF POC RARE (*)    All other components within normal limits  URINALYSIS, ROUTINE W REFLEX MICROSCOPIC - Abnormal; Notable for the following:    APPearance CLOUDY (*)    Protein, ur 30 (*)    Leukocytes, UA MODERATE (*)    All other components within normal limits  URINE CULTURE  PREGNANCY, URINE  URINE MICROSCOPIC-ADD ON  I-STAT CHEM 8, ED  GC/CHLAMYDIA PROBE AMP ([place])   Imaging Review No results found.   EKG Interpretation None      MDM   Final diagnoses:  Chronic pelvic pain in female  Urinary frequency    29 year old female with frequent visits to the emergency department for pelvic pain  presents to the emergency department for further evaluation of urinary frequency. Patient states that she has been treated for "20 urinary tract infections" in the past year. She denies ever being referred to urology. Patient complaining of some discomfort in her suprapubic region, mostly when voiding. She describes the sensation as "bloating". No peritoneal signs appreciated on exam.  Patient afebrile and hemodynamically stable. She is noted to have too numerous to count WBCs on her urinalysis. Urine pregnancy negative. Labs are stable today. EXAM completed for further evaluation of symptoms as well. Wet prep is largely unremarkable. Gonorrhea and chlamydia culture as well as urine culture pending.  Given patient's history of UTIs, will treat for this today with Keflex. Patient to be referred to urology for further evaluation of her frequent UTIs. She has also been instructed to follow-up with the health department in 48 hours regarding her gonorrhea and chlamydia culture. Return precautions provided. Patient discharged in good condition; VSS.   Filed Vitals:   04/02/14 1820 04/02/14 2023 04/02/14 2138  BP: 123/69 116/71 114/66  Pulse: 77 67 66  Temp: 98.6  F (37 C)  98.6 F (37 C)  TempSrc: Oral  Oral  Resp: 16 18 18   SpO2: 98% 99% 98%     Antonietta Breach, PA-C 04/03/14 0032  Leota Jacobsen, MD 04/05/14 2300

## 2014-04-02 NOTE — ED Notes (Signed)
Pt resting comfortably, c/o pressure in bladder. Awaiting lab results.

## 2014-04-02 NOTE — ED Notes (Signed)
Pt states she has been having reoccurring UTI for the past couple years. Pt states she goes to restroom and nothing really comes out and its painful. C.o slight d/c but no bleeding.

## 2014-04-02 NOTE — ED Notes (Signed)
Pt resting comfortably. Awaiting results.

## 2014-04-03 LAB — GC/CHLAMYDIA PROBE AMP (~~LOC~~) NOT AT ARMC
Chlamydia: NEGATIVE
Neisseria Gonorrhea: NEGATIVE

## 2014-04-05 LAB — URINE CULTURE: Colony Count: 40000

## 2014-04-06 ENCOUNTER — Telehealth: Payer: Self-pay | Admitting: *Deleted

## 2014-04-06 NOTE — ED Notes (Signed)
(+)  urine culture, no followup required per Ferne Coe, Pharm

## 2014-04-07 ENCOUNTER — Telehealth: Payer: Self-pay | Admitting: *Deleted

## 2014-04-07 NOTE — ED Notes (Signed)
(+)  urine culture, no further treatment needed, J. Frens

## 2014-04-26 ENCOUNTER — Encounter (HOSPITAL_COMMUNITY): Payer: Self-pay | Admitting: General Practice

## 2014-04-26 ENCOUNTER — Inpatient Hospital Stay (HOSPITAL_COMMUNITY)
Admission: AD | Admit: 2014-04-26 | Discharge: 2014-04-26 | Disposition: A | Discharge status: Home / Self Care | Payer: Medicaid Other | Source: Ambulatory Visit | Attending: Obstetrics & Gynecology | Admitting: Obstetrics & Gynecology

## 2014-04-26 DIAGNOSIS — R1031 Right lower quadrant pain: Secondary | ICD-10-CM | POA: Diagnosis present

## 2014-04-26 DIAGNOSIS — R110 Nausea: Secondary | ICD-10-CM | POA: Diagnosis not present

## 2014-04-26 DIAGNOSIS — R102 Pelvic and perineal pain: Secondary | ICD-10-CM | POA: Diagnosis not present

## 2014-04-26 DIAGNOSIS — R1032 Left lower quadrant pain: Secondary | ICD-10-CM | POA: Diagnosis present

## 2014-04-26 DIAGNOSIS — N926 Irregular menstruation, unspecified: Secondary | ICD-10-CM | POA: Diagnosis not present

## 2014-04-26 LAB — URINALYSIS, ROUTINE W REFLEX MICROSCOPIC
Bilirubin Urine: NEGATIVE
Glucose, UA: NEGATIVE mg/dL
Hgb urine dipstick: NEGATIVE
KETONES UR: NEGATIVE mg/dL
LEUKOCYTES UA: NEGATIVE
Nitrite: NEGATIVE
PH: 6.5 (ref 5.0–8.0)
Protein, ur: NEGATIVE mg/dL
Specific Gravity, Urine: 1.02 (ref 1.005–1.030)
Urobilinogen, UA: 0.2 mg/dL (ref 0.0–1.0)

## 2014-04-26 LAB — CBC
HEMATOCRIT: 39.2 % (ref 36.0–46.0)
Hemoglobin: 13.3 g/dL (ref 12.0–15.0)
MCH: 32.2 pg (ref 26.0–34.0)
MCHC: 33.9 g/dL (ref 30.0–36.0)
MCV: 94.9 fL (ref 78.0–100.0)
Platelets: 235 10*3/uL (ref 150–400)
RBC: 4.13 MIL/uL (ref 3.87–5.11)
RDW: 12.8 % (ref 11.5–15.5)
WBC: 9.7 10*3/uL (ref 4.0–10.5)

## 2014-04-26 LAB — RAPID URINE DRUG SCREEN, HOSP PERFORMED
AMPHETAMINES: NOT DETECTED
Barbiturates: NOT DETECTED
Benzodiazepines: NOT DETECTED
Cocaine: NOT DETECTED
OPIATES: NOT DETECTED
Tetrahydrocannabinol: POSITIVE — AB

## 2014-04-26 LAB — POCT PREGNANCY, URINE: Preg Test, Ur: NEGATIVE

## 2014-04-26 MED ORDER — ACETAMINOPHEN 500 MG PO TABS
1000.0000 mg | ORAL_TABLET | Freq: Once | ORAL | Status: AC
  Administered 2014-04-26: 1000 mg via ORAL
  Filled 2014-04-26: qty 2

## 2014-04-26 NOTE — MAU Note (Signed)
Pt had spotting on 2/24, bled for one day on 1/20.  HPT the beginning of Feb, was negative.  Lower abd pain, intermittent.  N&V for the past 2 weeks.

## 2014-04-26 NOTE — MAU Provider Note (Signed)
History     CSN: [id number]  Arrival date and time: 04/26/14 1125   First Provider Initiated Contact with Patient 04/26/14 1300      Chief Complaint  Patient presents with  . Abdominal Pain  . Emesis   HPI  Pt is a 29y/o female with a hx of a Salpingectomy in 2008 who presents to the MAU for abdominal pain as well as nausea. States that the pain began a few days ago and is in bilateral lower quadrants. Feels like a "pulling sensation" due to where her scar is located, as well as occasional sharp pains. Her LMP was 01/24 and consisted of slight spotting. Previous to that was 12/20 and she bled for one day. States that normally her menses occur on the 10th or 11th of each month and last for 3-4 days. The past two cycles have been unusual for her. She is not on any OCPs. She is sexually active. She is having BM and voiding well. No fevers.    Past Medical History  Diagnosis Date  . Tumor of ovary   . Headache(784.0)   . Boils   . Polycystic ovarian syndrome   . Mental disorder   . Anxiety     Past Surgical History  Procedure Laterality Date  . Tumor removal      History reviewed. No pertinent family history.  History  Substance Use Topics  . Smoking status: Current Some Day Smoker -- 0.25 packs/day    Types: Cigarettes  . Smokeless tobacco: Never Used  . Alcohol Use: No     Comment: no alcohol for over a year    Allergies:  Allergies  Allergen Reactions  . Ibuprofen Other (See Comments)    Stomach upset    Prescriptions prior to admission  Medication Sig Dispense Refill Last Dose  . cephALEXin (KEFLEX) 500 MG capsule Take 1 capsule (500 mg total) by mouth 2 (two) times daily. 14 capsule 0   . ciprofloxacin (CIPRO) 500 MG tablet Take 1 tablet (500 mg total) by mouth 2 (two) times daily. (Patient not taking: Reported on 02/18/2014) 28 tablet 0 Completed Course at Unknown time  . HYDROcodone-acetaminophen (NORCO/VICODIN) 5-325 MG per tablet Take 1 tablet by mouth  every 4 (four) hours as needed for moderate pain or severe pain. (Patient not taking: Reported on 02/18/2014) 6 tablet 0 Completed Course at Unknown time  . ondansetron (ZOFRAN) 4 MG tablet Take 1 tablet (4 mg total) by mouth every 6 (six) hours. (Patient not taking: Reported on 02/18/2014) 12 tablet 0 Not Taking at Unknown time  . phenazopyridine (PYRIDIUM) 200 MG tablet Take 1 tablet (200 mg total) by mouth 3 (three) times daily. 6 tablet 0   . promethazine (PHENERGAN) 25 MG tablet Take 25 mg by mouth every 6 (six) hours as needed for nausea or vomiting.   Past Week at Unknown time   Results for orders placed or performed during the hospital encounter of 04/26/14 (from the past 72 hour(s))  Urinalysis, Routine w reflex microscopic     Status: None   Collection Time: 04/26/14 12:00 PM  Result Value Ref Range   Color, Urine YELLOW YELLOW   APPearance CLEAR CLEAR   Specific Gravity, Urine 1.020 1.005 - 1.030   pH 6.5 5.0 - 8.0   Glucose, UA NEGATIVE NEGATIVE mg/dL   Hgb urine dipstick NEGATIVE NEGATIVE   Bilirubin Urine NEGATIVE NEGATIVE   Ketones, ur NEGATIVE NEGATIVE mg/dL   Protein, ur NEGATIVE NEGATIVE mg/dL   Urobilinogen,  UA 0.2 0.0 - 1.0 mg/dL   Nitrite NEGATIVE NEGATIVE   Leukocytes, UA NEGATIVE NEGATIVE    Comment: MICROSCOPIC NOT DONE ON URINES WITH NEGATIVE PROTEIN, BLOOD, LEUKOCYTES, NITRITE, OR GLUCOSE <1000 mg/dL.  Pregnancy, urine POC     Status: None   Collection Time: 04/26/14 12:07 PM  Result Value Ref Range   Preg Test, Ur NEGATIVE NEGATIVE    Comment:        THE SENSITIVITY OF THIS METHODOLOGY IS >24 mIU/mL   CBC     Status: None   Collection Time: 04/26/14  1:38 PM  Result Value Ref Range   WBC 9.7 4.0 - 10.5 K/uL   RBC 4.13 3.87 - 5.11 MIL/uL   Hemoglobin 13.3 12.0 - 15.0 g/dL   HCT 39.2 36.0 - 46.0 %   MCV 94.9 78.0 - 100.0 fL   MCH 32.2 26.0 - 34.0 pg   MCHC 33.9 30.0 - 36.0 g/dL   RDW 12.8 11.5 - 15.5 %   Platelets 235 150 - 400 K/uL     Review of  Systems  Constitutional: Negative for fever.  Eyes: Negative for blurred vision.  Respiratory: Negative for shortness of breath.   Cardiovascular: Negative for chest pain.  Gastrointestinal: Positive for nausea and abdominal pain. Negative for vomiting, diarrhea, constipation and blood in stool.  Genitourinary: Negative for dysuria and hematuria.   Physical Exam   Blood pressure 131/74, pulse 58, temperature 98.2 F (36.8 C), temperature source Oral, height 5\' 11"  (1.803 m), weight 91.173 kg (201 lb), last menstrual period 03/22/2014, SpO2 97 %.  Physical Exam  Constitutional: She is oriented to person, place, and time. She appears well-developed and well-nourished.  Cardiovascular: Normal rate and regular rhythm.   Respiratory: Effort normal and breath sounds normal.  GI: Soft. Bowel sounds are normal. She exhibits no distension. There is tenderness. There is no rebound and no guarding.  Neurological: She is alert and oriented to person, place, and time.  Skin: Skin is warm and dry.   Patient refused bimanual exam/ pelvic exam   MAU Course  Procedures MDM  CBC Tylenol 1 gram. Patient feels complete relief from tylenol. Denies pain at this time.   Assessment and Plan  A- Irregular Menses     Pelvic pain in female   P- Given Tylenol 1000mg  in clinic. D/C home and f/u with PCP       Return to MAU if symptoms worsen  [name] [name], [name] [name] 04/26/2014 at 2:41 PM    PA student attestation:  I have seen and examined this patient; I agree with above documentation in the PA students note.   [name] [name] is a 29 y.o. G0P0 reporting ongoing abdominal pain. Patient has been seen several different times in different ER settings with this pain. She also complains of on-going nausea.    PE: BP 131/74 mmHg  Pulse 58  Temp(Src) 98.2 F (36.8 C) (Oral)  Ht 5\' 11"  (1.803 m)  Wt 91.173 kg (201 lb)  BMI 28.05 kg/m2  SpO2 97%  LMP 03/22/2014 (Approximate) Gen: calm  comfortable, NAD Resp: normal effort, no distress Abd: soft, some tenderness throughout abdomen, overall generalized tenderness, no guarding, no rebound.   ROS, labs, PMH reviewed   Plan: As stated above.   [name] Hillock Rasch, NP 04/26/2014 2:52 PM

## 2014-04-26 NOTE — Discharge Instructions (Signed)
Abdominal Pain, Women °Abdominal (stomach, pelvic, or belly) pain can be caused by many things. It is important to tell your doctor: °· The location of the pain. °· Does it come and go or is it present all the time? °· Are there things that start the pain (eating certain foods, exercise)? °· Are there other symptoms associated with the pain (fever, nausea, vomiting, diarrhea)? °All of this is helpful to know when trying to find the cause of the pain. °CAUSES  °· Stomach: virus or bacteria infection, or ulcer. °· Intestine: appendicitis (inflamed appendix), regional ileitis (Crohn's disease), ulcerative colitis (inflamed colon), irritable bowel syndrome, diverticulitis (inflamed diverticulum of the colon), or cancer of the stomach or intestine. °· Gallbladder disease or stones in the gallbladder. °· Kidney disease, kidney stones, or infection. °· Pancreas infection or cancer. °· Fibromyalgia (pain disorder). °· Diseases of the female organs: °¨ Uterus: fibroid (non-cancerous) tumors or infection. °¨ Fallopian tubes: infection or tubal pregnancy. °¨ Ovary: cysts or tumors. °¨ Pelvic adhesions (scar tissue). °¨ Endometriosis (uterus lining tissue growing in the pelvis and on the pelvic organs). °¨ Pelvic congestion syndrome (female organs filling up with blood just before the menstrual period). °¨ Pain with the menstrual period. °¨ Pain with ovulation (producing an egg). °¨ Pain with an IUD (intrauterine device, birth control) in the uterus. °¨ Cancer of the female organs. °· Functional pain (pain not caused by a disease, may improve without treatment). °· Psychological pain. °· Depression. °DIAGNOSIS  °Your doctor will decide the seriousness of your pain by doing an examination. °· Blood tests. °· X-rays. °· Ultrasound. °· CT scan (computed tomography, special type of X-ray). °· MRI (magnetic resonance imaging). °· Cultures, for infection. °· Barium enema (dye inserted in the large intestine, to better view it with  X-rays). °· Colonoscopy (looking in intestine with a lighted tube). °· Laparoscopy (minor surgery, looking in abdomen with a lighted tube). °· Major abdominal exploratory surgery (looking in abdomen with a large incision). °TREATMENT  °The treatment will depend on the cause of the pain.  °· Many cases can be observed and treated at home. °· Over-the-counter medicines recommended by your caregiver. °· Prescription medicine. °· Antibiotics, for infection. °· Birth control pills, for painful periods or for ovulation pain. °· Hormone treatment, for endometriosis. °· Nerve blocking injections. °· Physical therapy. °· Antidepressants. °· Counseling with a psychologist or psychiatrist. °· Minor or major surgery. °HOME CARE INSTRUCTIONS  °· Do not take laxatives, unless directed by your caregiver. °· Take over-the-counter pain medicine only if ordered by your caregiver. Do not take aspirin because it can cause an upset stomach or bleeding. °· Try a clear liquid diet (broth or water) as ordered by your caregiver. Slowly move to a bland diet, as tolerated, if the pain is related to the stomach or intestine. °· Have a thermometer and take your temperature several times a day, and record it. °· Bed rest and sleep, if it helps the pain. °· Avoid sexual intercourse, if it causes pain. °· Avoid stressful situations. °· Keep your follow-up appointments and tests, as your caregiver orders. °· If the pain does not go away with medicine or surgery, you may try: °¨ Acupuncture. °¨ Relaxation exercises (yoga, meditation). °¨ Group therapy. °¨ Counseling. °SEEK MEDICAL CARE IF:  °· You notice certain foods cause stomach pain. °· Your home care treatment is not helping your pain. °· You need stronger pain medicine. °· You want your IUD removed. °· You feel faint or   lightheaded. °· You develop nausea and vomiting. °· You develop a rash. °· You are having side effects or an allergy to your medicine. °SEEK IMMEDIATE MEDICAL CARE IF:  °· Your  pain does not go away or gets worse. °· You have a fever. °· Your pain is felt only in portions of the abdomen. The right side could possibly be appendicitis. The left lower portion of the abdomen could be colitis or diverticulitis. °· You are passing blood in your stools (bright red or black tarry stools, with or without vomiting). °· You have blood in your urine. °· You develop chills, with or without a fever. °· You pass out. °MAKE SURE YOU:  °· Understand these instructions. °· Will watch your condition. °· Will get help right away if you are not doing well or get worse. °Document Released: 12/15/2006 Document Revised: 07/04/2013 Document Reviewed: 01/04/2009 °[employer]® Patient Information ©2015 [employer]. This information is not intended to replace advice given to you by your health care provider. Make sure you discuss any questions you have with your health care provider. ° °

## 2014-07-01 ENCOUNTER — Encounter (HOSPITAL_COMMUNITY): Payer: Self-pay | Admitting: *Deleted

## 2014-07-01 ENCOUNTER — Inpatient Hospital Stay (HOSPITAL_COMMUNITY)
Admission: AD | Admit: 2014-07-01 | Discharge: 2014-07-01 | Disposition: A | Discharge status: Home / Self Care | Payer: Medicaid Other | Source: Ambulatory Visit | Attending: Family Medicine | Admitting: Family Medicine

## 2014-07-01 DIAGNOSIS — N760 Acute vaginitis: Secondary | ICD-10-CM | POA: Diagnosis not present

## 2014-07-01 DIAGNOSIS — F17210 Nicotine dependence, cigarettes, uncomplicated: Secondary | ICD-10-CM | POA: Diagnosis not present

## 2014-07-01 DIAGNOSIS — B3731 Acute candidiasis of vulva and vagina: Secondary | ICD-10-CM

## 2014-07-01 DIAGNOSIS — R109 Unspecified abdominal pain: Secondary | ICD-10-CM | POA: Diagnosis present

## 2014-07-01 DIAGNOSIS — A499 Bacterial infection, unspecified: Secondary | ICD-10-CM

## 2014-07-01 DIAGNOSIS — B373 Candidiasis of vulva and vagina: Secondary | ICD-10-CM | POA: Diagnosis not present

## 2014-07-01 DIAGNOSIS — B9689 Other specified bacterial agents as the cause of diseases classified elsewhere: Secondary | ICD-10-CM | POA: Insufficient documentation

## 2014-07-01 LAB — CBC
HCT: 38.9 % (ref 36.0–46.0)
Hemoglobin: 13.6 g/dL (ref 12.0–15.0)
MCH: 32.7 pg (ref 26.0–34.0)
MCHC: 35 g/dL (ref 30.0–36.0)
MCV: 93.5 fL (ref 78.0–100.0)
PLATELETS: 254 10*3/uL (ref 150–400)
RBC: 4.16 MIL/uL (ref 3.87–5.11)
RDW: 12.4 % (ref 11.5–15.5)
WBC: 8.8 10*3/uL (ref 4.0–10.5)

## 2014-07-01 LAB — URINALYSIS, ROUTINE W REFLEX MICROSCOPIC
Bilirubin Urine: NEGATIVE
Glucose, UA: NEGATIVE mg/dL
Hgb urine dipstick: NEGATIVE
KETONES UR: NEGATIVE mg/dL
Leukocytes, UA: NEGATIVE
Nitrite: NEGATIVE
Protein, ur: NEGATIVE mg/dL
Specific Gravity, Urine: 1.025 (ref 1.005–1.030)
Urobilinogen, UA: 1 mg/dL (ref 0.0–1.0)
pH: 6.5 (ref 5.0–8.0)

## 2014-07-01 LAB — WET PREP, GENITAL
Trich, Wet Prep: NONE SEEN
Yeast Wet Prep HPF POC: NONE SEEN

## 2014-07-01 LAB — POCT PREGNANCY, URINE: PREG TEST UR: NEGATIVE

## 2014-07-01 MED ORDER — FLUCONAZOLE 150 MG PO TABS: 150.0000 mg | ORAL_TABLET | Freq: Once | ORAL | Status: DC

## 2014-07-01 MED ORDER — METRONIDAZOLE 500 MG PO TABS: 500.0000 mg | ORAL_TABLET | Freq: Two times a day (BID) | ORAL | Status: DC

## 2014-07-01 NOTE — MAU Note (Signed)
Pt presents to MAU with complaints of pain in her lower abdomen and lower back that started a week ago.

## 2014-07-01 NOTE — Discharge Instructions (Signed)
Bacterial Vaginosis Bacterial vaginosis is a vaginal infection that occurs when the normal balance of bacteria in the vagina is disrupted. It results from an overgrowth of certain bacteria. This is the most common vaginal infection in women of childbearing age. Treatment is important to prevent complications, especially in pregnant women, as it can cause a premature delivery. CAUSES  Bacterial vaginosis is caused by an increase in harmful bacteria that are normally present in smaller amounts in the vagina. Several different kinds of bacteria can cause bacterial vaginosis. However, the reason that the condition develops is not fully understood. RISK FACTORS Certain activities or behaviors can put you at an increased risk of developing bacterial vaginosis, including:  Having a new sex partner or multiple sex partners.  Douching.  Using an intrauterine device (IUD) for contraception. Women do not get bacterial vaginosis from toilet seats, bedding, swimming pools, or contact with objects around them. SIGNS AND SYMPTOMS  Some women with bacterial vaginosis have no signs or symptoms. Common symptoms include:  Grey vaginal discharge.  A fishlike odor with discharge, especially after sexual intercourse.  Itching or burning of the vagina and vulva.  Burning or pain with urination. DIAGNOSIS  Your health care provider will take a medical history and examine the vagina for signs of bacterial vaginosis. A sample of vaginal fluid may be taken. Your health care provider will look at this sample under a microscope to check for bacteria and abnormal cells. A vaginal pH test may also be done.  TREATMENT  Bacterial vaginosis may be treated with antibiotic medicines. These may be given in the form of a pill or a vaginal cream. A second round of antibiotics may be prescribed if the condition comes back after treatment.  HOME CARE INSTRUCTIONS   Only take over-the-counter or prescription medicines as  directed by your health care provider.  If antibiotic medicine was prescribed, take it as directed. Make sure you finish it even if you start to feel better.  Do not have sex until treatment is completed.  Tell all sexual partners that you have a vaginal infection. They should see their health care provider and be treated if they have problems, such as a mild rash or itching.  Practice safe sex by using condoms and only having one sex partner. SEEK MEDICAL CARE IF:   Your symptoms are not improving after 3 days of treatment.  You have increased discharge or pain.  You have a fever. MAKE SURE YOU:   Understand these instructions.  Will watch your condition.  Will get help right away if you are not doing well or get worse. FOR MORE INFORMATION  Centers for Disease Control and Prevention, Division of STD Prevention: www.cdc.gov/std American Sexual Health Association (ASHA): www.ashastd.org  Document Released: 02/17/2005 Document Revised: 12/08/2012 Document Reviewed: 09/29/2012 ExitCare Patient Information 2015 [employer]. This information is not intended to replace advice given to you by your health care provider. Make sure you discuss any questions you have with your health care provider. Monilial Vaginitis Vaginitis in a soreness, swelling and redness (inflammation) of the vagina and vulva. Monilial vaginitis is not a sexually transmitted infection. CAUSES  Yeast vaginitis is caused by yeast (candida) that is normally found in your vagina. With a yeast infection, the candida has overgrown in number to a point that upsets the chemical balance. SYMPTOMS   White, thick vaginal discharge.  Swelling, itching, redness and irritation of the vagina and possibly the lips of the vagina (vulva).  Burning or painful   urination.  Painful intercourse. DIAGNOSIS  Things that may contribute to monilial vaginitis are:  Postmenopausal and virginal  states.  Pregnancy.  Infections.  Being tired, sick or stressed, especially if you had monilial vaginitis in the past.  Diabetes. Good control will help lower the chance.  Birth control pills.  Tight fitting garments.  Using bubble bath, feminine sprays, douches or deodorant tampons.  Taking certain medications that kill germs (antibiotics).  Sporadic recurrence can occur if you become ill. TREATMENT  Your caregiver will give you medication.  There are several kinds of anti monilial vaginal creams and suppositories specific for monilial vaginitis. For recurrent yeast infections, use a suppository or cream in the vagina 2 times a week, or as directed.  Anti-monilial or steroid cream for the itching or irritation of the vulva may also be used. Get your caregiver's permission.  Painting the vagina with methylene blue solution may help if the monilial cream does not work.  Eating yogurt may help prevent monilial vaginitis. HOME CARE INSTRUCTIONS   Finish all medication as prescribed.  Do not have sex until treatment is completed or after your caregiver tells you it is okay.  Take warm sitz baths.  Do not douche.  Do not use tampons, especially scented ones.  Wear cotton underwear.  Avoid tight pants and panty hose.  Tell your sexual partner that you have a yeast infection. They should go to their caregiver if they have symptoms such as mild rash or itching.  Your sexual partner should be treated as well if your infection is difficult to eliminate.  Practice safer sex. Use condoms.  Some vaginal medications cause latex condoms to fail. Vaginal medications that harm condoms are:  Cleocin cream.  Butoconazole (Femstat).  Terconazole (Terazol) vaginal suppository.  Miconazole (Monistat) (may be purchased over the counter). SEEK MEDICAL CARE IF:   You have a temperature by mouth above 102 F (38.9 C).  The infection is getting worse after 2 days of  treatment.  The infection is not getting better after 3 days of treatment.  You develop blisters in or around your vagina.  You develop vaginal bleeding, and it is not your menstrual period.  You have pain when you urinate.  You develop intestinal problems.  You have pain with sexual intercourse. Document Released: 11/27/2004 Document Revised: 05/12/2011 Document Reviewed: 08/11/2008 ExitCare Patient Information 2015 [employer]. This information is not intended to replace advice given to you by your health care provider. Make sure you discuss any questions you have with your health care provider.  

## 2014-07-01 NOTE — MAU Provider Note (Signed)
History     CSN: [id number]  Arrival date and time: 07/01/14 1109   None     Chief Complaint  Patient presents with  . Abdominal Pain  . Back Pain   HPI 29 y.o. G0P0 with low abd pain, low back pain x 1 week, states that it comes and goes, cannot quantify or state frequency, states the pain is "right in the area of my bone", points to right hip. Reports that pain occurs more in certain positions. States she thought she had a UTI last week, had keflex at home, just finished 1 week course. States that she had a normal period 05/02/14, then her next period came a few days late on 06/06/14 and was shorter than usual. No bleeding since.  Past Medical History  Diagnosis Date  . Tumor of ovary   . Headache(784.0)   . Boils   . Polycystic ovarian syndrome   . Mental disorder   . Anxiety     Past Surgical History  Procedure Laterality Date  . Tumor removal      History reviewed. No pertinent family history.  History  Substance Use Topics  . Smoking status: Current Some Day Smoker -- 0.25 packs/day    Types: Cigarettes  . Smokeless tobacco: Never Used  . Alcohol Use: No     Comment: no alcohol for over a year    Allergies:  Allergies  Allergen Reactions  . Ibuprofen Other (See Comments)    Stomach upset    No prescriptions prior to admission    Review of Systems  Constitutional: Negative.   Respiratory: Negative.   Cardiovascular: Negative.   Gastrointestinal: Positive for abdominal pain. Negative for nausea, vomiting, diarrhea and constipation.  Genitourinary: Negative for dysuria, urgency, frequency, hematuria and flank pain.       Negative for vaginal bleeding, vaginal discharge, dyspareunia  Musculoskeletal: Positive for back pain and joint pain.  Neurological: Negative.   Psychiatric/Behavioral: Negative.    Physical Exam   Blood pressure 114/60, pulse 87, temperature 97.8 F (36.6 C), resp. rate 18, height 5\' 10"  (1.778 m), weight 195 lb (88.451 kg), last  menstrual period 06/06/2014.  Physical Exam  Nursing note and vitals reviewed. Constitutional: She is oriented to person, place, and time. She appears well-developed and well-nourished. No distress.  HENT:  Head: Normocephalic and atraumatic.  Cardiovascular: Normal rate, regular rhythm and normal heart sounds.   Respiratory: Effort normal and breath sounds normal. No respiratory distress.  GI: Soft. Bowel sounds are normal. She exhibits no distension and no mass. There is no tenderness. There is no rebound and no guarding.  Genitourinary: There is no rash or lesion on the right labia. There is no rash or lesion on the left labia. Uterus is not deviated, not enlarged, not fixed and not tender. Cervix exhibits no motion tenderness, no discharge and no friability. Right adnexum displays no mass, no tenderness and no fullness. Left adnexum displays no mass, no tenderness and no fullness. No erythema, tenderness or bleeding in the vagina. Vaginal discharge (curdy) found.  Neurological: She is alert and oriented to person, place, and time.  Skin: Skin is warm and dry.  Psychiatric: She has a normal mood and affect.    MAU Course  Procedures  Results for orders placed or performed during the hospital encounter of 07/01/14 (from the past 24 hour(s))  Urinalysis, Routine w reflex microscopic     Status: None   Collection Time: 07/01/14 11:15 AM  Result Value Ref Range  Color, Urine YELLOW YELLOW   APPearance CLEAR CLEAR   Specific Gravity, Urine 1.025 1.005 - 1.030   pH 6.5 5.0 - 8.0   Glucose, UA NEGATIVE NEGATIVE mg/dL   Hgb urine dipstick NEGATIVE NEGATIVE   Bilirubin Urine NEGATIVE NEGATIVE   Ketones, ur NEGATIVE NEGATIVE mg/dL   Protein, ur NEGATIVE NEGATIVE mg/dL   Urobilinogen, UA 1.0 0.0 - 1.0 mg/dL   Nitrite NEGATIVE NEGATIVE   Leukocytes, UA NEGATIVE NEGATIVE  Pregnancy, urine POC     Status: None   Collection Time: 07/01/14 11:31 AM  Result Value Ref Range   Preg Test, Ur  NEGATIVE NEGATIVE  CBC     Status: None   Collection Time: 07/01/14 11:50 AM  Result Value Ref Range   WBC 8.8 4.0 - 10.5 K/uL   RBC 4.16 3.87 - 5.11 MIL/uL   Hemoglobin 13.6 12.0 - 15.0 g/dL   HCT 38.9 36.0 - 46.0 %   MCV 93.5 78.0 - 100.0 fL   MCH 32.7 26.0 - 34.0 pg   MCHC 35.0 30.0 - 36.0 g/dL   RDW 12.4 11.5 - 15.5 %   Platelets 254 150 - 400 K/uL  Wet prep, genital     Status: Abnormal   Collection Time: 07/01/14 11:51 AM  Result Value Ref Range   Yeast Wet Prep HPF POC NONE SEEN NONE SEEN   Trich, Wet Prep NONE SEEN NONE SEEN   Clue Cells Wet Prep HPF POC MODERATE (A) NONE SEEN   WBC, Wet Prep HPF POC FEW (A) NONE SEEN     Assessment and Plan   1. BV (bacterial vaginosis)   2. Vaginal candida   Treat w/ Flagyl for BV, Diflucan for yeast by clinical exam F/U PRN    Medication List    TAKE these medications        escitalopram 10 MG tablet  Commonly known as:  LEXAPRO  Take 10 mg by mouth daily.     fluconazole 150 MG tablet  Commonly known as:  DIFLUCAN  Take 1 tablet (150 mg total) by mouth once.     metroNIDAZOLE 500 MG tablet  Commonly known as:  FLAGYL  Take 1 tablet (500 mg total) by mouth 2 (two) times daily.     QUEtiapine 400 MG 24 hr tablet  Commonly known as:  SEROQUEL XR  Take 400 mg by mouth at bedtime.     traZODone 100 MG tablet  Commonly known as:  DESYREL  Take 100 mg by mouth at bedtime.        Follow-up Information    Follow up with [facility].   Why:  If symptoms worsen   Contact information:   [place] [phone]         Susa Simmonds 07/01/2014, 2:41 PM

## 2014-07-02 LAB — HIV ANTIBODY (ROUTINE TESTING W REFLEX): HIV Screen 4th Generation wRfx: NONREACTIVE

## 2014-07-03 LAB — GC/CHLAMYDIA PROBE AMP (~~LOC~~) NOT AT ARMC
Chlamydia: NEGATIVE
NEISSERIA GONORRHEA: NEGATIVE

## 2014-07-26 ENCOUNTER — Encounter (HOSPITAL_COMMUNITY): Payer: Self-pay | Admitting: *Deleted

## 2014-07-26 ENCOUNTER — Inpatient Hospital Stay (HOSPITAL_COMMUNITY): Payer: Medicaid Other

## 2014-07-26 ENCOUNTER — Inpatient Hospital Stay (HOSPITAL_COMMUNITY)
Admission: AD | Admit: 2014-07-26 | Discharge: 2014-07-26 | Disposition: A | Discharge status: Home / Self Care | Payer: Medicaid Other | Source: Ambulatory Visit | Attending: Family Medicine | Admitting: Family Medicine

## 2014-07-26 DIAGNOSIS — Z87891 Personal history of nicotine dependence: Secondary | ICD-10-CM | POA: Diagnosis not present

## 2014-07-26 DIAGNOSIS — R198 Other specified symptoms and signs involving the digestive system and abdomen: Secondary | ICD-10-CM | POA: Diagnosis not present

## 2014-07-26 DIAGNOSIS — Q5039 Other congenital malformation of ovary: Secondary | ICD-10-CM | POA: Diagnosis not present

## 2014-07-26 DIAGNOSIS — Z3202 Encounter for pregnancy test, result negative: Secondary | ICD-10-CM | POA: Insufficient documentation

## 2014-07-26 DIAGNOSIS — R1900 Intra-abdominal and pelvic swelling, mass and lump, unspecified site: Secondary | ICD-10-CM

## 2014-07-26 DIAGNOSIS — N8320 Unspecified ovarian cysts: Secondary | ICD-10-CM | POA: Diagnosis not present

## 2014-07-26 DIAGNOSIS — R838 Other abnormal findings in cerebrospinal fluid: Secondary | ICD-10-CM

## 2014-07-26 DIAGNOSIS — N83202 Unspecified ovarian cyst, left side: Secondary | ICD-10-CM

## 2014-07-26 DIAGNOSIS — N838 Other noninflammatory disorders of ovary, fallopian tube and broad ligament: Secondary | ICD-10-CM

## 2014-07-26 LAB — URINALYSIS, ROUTINE W REFLEX MICROSCOPIC
Bilirubin Urine: NEGATIVE
Glucose, UA: NEGATIVE mg/dL
HGB URINE DIPSTICK: NEGATIVE
Ketones, ur: NEGATIVE mg/dL
Leukocytes, UA: NEGATIVE
Nitrite: NEGATIVE
PROTEIN: NEGATIVE mg/dL
Specific Gravity, Urine: >1.03 — ABNORMAL HIGH (ref 1.005–1.030)
Urobilinogen, UA: 0.2 mg/dL (ref 0.0–1.0)
pH: 6 (ref 5.0–8.0)

## 2014-07-26 LAB — POCT PREGNANCY, URINE: PREG TEST UR: NEGATIVE

## 2014-07-26 NOTE — MAU Note (Addendum)
PT SAYS  SHE  HAD  AN ECTOPIC IN 2003-  IT GREW  UNTIL 2008.    DR DOVE REMOVED  LEFT TUBE IN 2008-   THEN  IN 2013   ALL FINE.     WAS HERE IN April-   UPT- NEG.   NOW SAYS ABD  HAS BECOME      LARGER DAILY- AND GAINING WT .   LMP-   4-5  X2 DAYS .  NO HPT.     LAST SEX-  YESTERDAY.   NO BIRTH CONTROL.     NO BLEEDING  OR  D/C   LIVES IN [place]

## 2014-07-26 NOTE — MAU Provider Note (Signed)
CSN: [id number]     Arrival date & time 07/26/14  3888 History   None    No chief complaint on file.    (Consider location/radiation/quality/duration/timing/severity/associated sxs/prior Treatment) HPI [name] [name] is a 29 y.o. 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 She reports that her period last month only lasted 2 days and she has not had a period this month. She denies pain just enlarged abdomen. She is afraid she has another tumor. Patient lives at [facility]. Evaluated here 07/01/14 and treated for BV. Cultures for GC and Chlamydia were negative.   Past Medical History  Diagnosis Date  . Tumor of ovary   . Headache(784.0)   . Boils   . Polycystic ovarian syndrome   . Mental disorder   . Anxiety    Past Surgical History  Procedure Laterality Date  . Tumor removal     History reviewed. No pertinent family history. History  Substance Use Topics  . Smoking status: Former Smoker -- 0.25 packs/day    Types: Cigarettes  . Smokeless tobacco: Never Used  . Alcohol Use: No     Comment: no alcohol for over a year   OB History    Gravida Para Term Preterm AB TAB SAB Ectopic Multiple Living   0              Review of Systems Negative except as stated in HPI   Allergies  Ibuprofen  Home Medications   Prior to Admission medications   Medication Sig Start Date End Date Taking? Authorizing Provider  escitalopram (LEXAPRO) 10 MG tablet Take 10 mg by mouth daily.    Historical Provider, MD  fluconazole (DIFLUCAN) 150 MG tablet Take 1 tablet (150 mg total) by mouth once. 07/01/14   Rosezella Rumpf, CNM  metroNIDAZOLE (FLAGYL) 500 MG tablet Take 1 tablet (500 mg total) by mouth 2 (two) times daily. 07/01/14   Rosezella Rumpf, CNM  QUEtiapine (SEROQUEL XR) 400 MG 24 hr tablet Take 400 mg by mouth at bedtime.    Historical  Provider, MD  traZODone (DESYREL) 100 MG tablet Take 100 mg by mouth at bedtime.    Historical Provider, MD   BP 117/76 mmHg  Pulse 64  Temp(Src) 97.6 F (36.4 C) (Oral)  Resp 20  Ht 5\' 10"  (1.778 m)  Wt 199 lb 2 oz (90.323 kg)  BMI 28.57 kg/m2  LMP 06/06/2014 (Exact Date) Physical Exam  Constitutional: She is oriented to person, place, and time. She appears well-developed and well-nourished.  HENT:  Head: Normocephalic.  Eyes: EOM are normal.  Neck: Neck supple.  Cardiovascular: Normal rate.   Pulmonary/Chest: Effort normal.  Abdominal: Soft. She exhibits distension. There is no tenderness.  Musculoskeletal: Normal range of motion.  Neurological: She is alert and oriented to person, place, and time. No cranial nerve deficit.  Skin: Skin is warm and dry.  Nursing note and vitals reviewed.   ED Course  Procedures (including critical care time) Labs Review Results for orders placed or performed during the hospital encounter of 07/26/14 (from the past 24 hour(s))  Urinalysis, Routine w reflex microscopic     Status: Abnormal   Collection Time: 07/26/14  6:58 AM  Result Value Ref Range   Color, Urine YELLOW YELLOW   APPearance CLEAR CLEAR   Specific Gravity, Urine >1.030 (H) 1.005 - 1.030   pH 6.0  5.0 - 8.0   Glucose, UA NEGATIVE NEGATIVE mg/dL   Hgb urine dipstick NEGATIVE NEGATIVE   Bilirubin Urine NEGATIVE NEGATIVE   Ketones, ur NEGATIVE NEGATIVE mg/dL   Protein, ur NEGATIVE NEGATIVE mg/dL   Urobilinogen, UA 0.2 0.0 - 1.0 mg/dL   Nitrite NEGATIVE NEGATIVE   Leukocytes, UA NEGATIVE NEGATIVE  Pregnancy, urine POC     Status: None   Collection Time: 07/26/14  7:05 AM  Result Value Ref Range   Preg Test, Ur NEGATIVE NEGATIVE    Imaging Review No results found.  Care turned over to [facility] Rash, NP @ 0800.    US Transvaginal Non-ob  07/26/2014   CLINICAL DATA:  29 year old female with abdominal swelling and history of prior left para ovarian cyst removal in 2012  and polycystic ovarian syndrome.  EXAM: TRANSABDOMINAL AND TRANSVAGINAL ULTRASOUND OF PELVIS  TECHNIQUE: Both transabdominal and transvaginal ultrasound examinations of the pelvis were performed. Transabdominal technique was performed for global imaging of the pelvis including uterus, ovaries, adnexal regions, and pelvic cul-de-sac. It was necessary to proceed with endovaginal exam following the transabdominal exam to visualize the bilateral ovaries and adnexa.  COMPARISON:  Prior pelvic ultrasound 05/12/2013  FINDINGS: Uterus  Measurements: 7.4 x 4.5 x 5.2 cm. No fibroids or other mass visualized.  Endometrium  Thickness: 5 mm.  No focal abnormality visualized.  Right ovary  Measurements: 7.2 x 2.0 x 2.8 cm (previously 6.8 x 3.0 x 2.2 cm). Mildly enlarged but otherwise normal in appearance. No adnexal mass.  Left ovary  Measurements: 6.3 x 2.7 x 3.5 cm (previously 4.1 x 2.6 x 2.3 cm). Mildly enlarged. There is a 2.9 cm anechoic simple cyst consistent with a dominant follicle. This is a finding of no clinical significance.  Other findings  Trace free fluid in the pelvis is likely physiologic.  IMPRESSION: 1. Similar appearance of enlarged ovaries bilaterally which can be seen in the setting of polycystic ovarian syndrome. 2. Dominant follicular cyst on the left noted incidentally.   Electronically Signed   By: Jacqulynn Cadet M.D.   On: 07/26/2014 09:25   US Pelvis Complete  07/26/2014   CLINICAL DATA:  29 year old female with abdominal swelling and history of prior left para ovarian cyst removal in 2012 and polycystic ovarian syndrome.  EXAM: TRANSABDOMINAL AND TRANSVAGINAL ULTRASOUND OF PELVIS  TECHNIQUE: Both transabdominal and transvaginal ultrasound examinations of the pelvis were performed. Transabdominal technique was performed for global imaging of the pelvis including uterus, ovaries, adnexal regions, and pelvic cul-de-sac. It was necessary to proceed with endovaginal exam following the transabdominal  exam to visualize the bilateral ovaries and adnexa.  COMPARISON:  Prior pelvic ultrasound 05/12/2013  FINDINGS: Uterus  Measurements: 7.4 x 4.5 x 5.2 cm. No fibroids or other mass visualized.  Endometrium  Thickness: 5 mm.  No focal abnormality visualized.  Right ovary  Measurements: 7.2 x 2.0 x 2.8 cm (previously 6.8 x 3.0 x 2.2 cm). Mildly enlarged but otherwise normal in appearance. No adnexal mass.  Left ovary  Measurements: 6.3 x 2.7 x 3.5 cm (previously 4.1 x 2.6 x 2.3 cm). Mildly enlarged. There is a 2.9 cm anechoic simple cyst consistent with a dominant follicle. This is a finding of no clinical significance.  Other findings  Trace free fluid in the pelvis is likely physiologic.  IMPRESSION: 1. Similar appearance of enlarged ovaries bilaterally which can be seen in the setting of polycystic ovarian syndrome. 2. Dominant follicular cyst on the left noted incidentally.   Electronically Signed  By: Jacqulynn Cadet M.D.   On: 07/26/2014 09:25    Discussed [place] results with the patient.   MDM    A:  1. Abdominal fullness   2. Abdominal swelling   3. Enlarged ovary   4. Left ovarian cyst     P:  Discharge home in stable condition Ok to take tylenol as needed, as directed on the bottle Return to MAU for emergencies Follow up with PCP as scheduled.   Lezlie Lye, NP 07/26/2014 10:26 AM

## 2014-07-30 ENCOUNTER — Encounter (HOSPITAL_COMMUNITY): Payer: Self-pay | Admitting: *Deleted

## 2014-07-30 ENCOUNTER — Emergency Department (HOSPITAL_COMMUNITY)
Admission: EM | Admit: 2014-07-30 | Discharge: 2014-07-30 | Disposition: A | Discharge status: Home / Self Care | Payer: Medicaid Other | Attending: Emergency Medicine | Admitting: Emergency Medicine

## 2014-07-30 DIAGNOSIS — Z87891 Personal history of nicotine dependence: Secondary | ICD-10-CM | POA: Diagnosis not present

## 2014-07-30 DIAGNOSIS — Z8639 Personal history of other endocrine, nutritional and metabolic disease: Secondary | ICD-10-CM | POA: Diagnosis not present

## 2014-07-30 DIAGNOSIS — Z86018 Personal history of other benign neoplasm: Secondary | ICD-10-CM | POA: Diagnosis not present

## 2014-07-30 DIAGNOSIS — Z3202 Encounter for pregnancy test, result negative: Secondary | ICD-10-CM | POA: Diagnosis not present

## 2014-07-30 DIAGNOSIS — F919 Conduct disorder, unspecified: Secondary | ICD-10-CM | POA: Diagnosis not present

## 2014-07-30 DIAGNOSIS — F3130 Bipolar disorder, current episode depressed, mild or moderate severity, unspecified: Secondary | ICD-10-CM

## 2014-07-30 DIAGNOSIS — Z872 Personal history of diseases of the skin and subcutaneous tissue: Secondary | ICD-10-CM | POA: Diagnosis not present

## 2014-07-30 DIAGNOSIS — R4689 Other symptoms and signs involving appearance and behavior: Secondary | ICD-10-CM

## 2014-07-30 DIAGNOSIS — R000 Tachycardia, unspecified: Secondary | ICD-10-CM | POA: Insufficient documentation

## 2014-07-30 DIAGNOSIS — R4182 Altered mental status, unspecified: Secondary | ICD-10-CM | POA: Diagnosis present

## 2014-07-30 DIAGNOSIS — F1219 Cannabis abuse with unspecified cannabis-induced disorder: Secondary | ICD-10-CM

## 2014-07-30 DIAGNOSIS — F319 Bipolar disorder, unspecified: Secondary | ICD-10-CM | POA: Diagnosis present

## 2014-07-30 LAB — COMPREHENSIVE METABOLIC PANEL
ALT: 35 U/L (ref 14–54)
ANION GAP: 11 (ref 5–15)
AST: 66 U/L — ABNORMAL HIGH (ref 15–41)
Albumin: 4.5 g/dL (ref 3.5–5.0)
Alkaline Phosphatase: 57 U/L (ref 38–126)
BILIRUBIN TOTAL: 1.1 mg/dL (ref 0.3–1.2)
BUN: 19 mg/dL (ref 6–20)
CO2: 20 mmol/L — AB (ref 22–32)
Calcium: 9.9 mg/dL (ref 8.9–10.3)
Chloride: 110 mmol/L (ref 101–111)
Creatinine, Ser: 1.35 mg/dL — ABNORMAL HIGH (ref 0.44–1.00)
GFR calc Af Amer: >60 mL/min (ref >60)
GFR calc non Af Amer: 52 mL/min — ABNORMAL LOW (ref >60)
GLUCOSE: 108 mg/dL — AB (ref 65–99)
POTASSIUM: 3.6 mmol/L (ref 3.5–5.1)
Sodium: 141 mmol/L (ref 135–145)
Total Protein: 7.8 g/dL (ref 6.5–8.1)

## 2014-07-30 LAB — CBC WITH DIFFERENTIAL/PLATELET
Basophils Absolute: 0 10*3/uL (ref 0.0–0.1)
Basophils Relative: 0 % (ref 0–1)
Eosinophils Absolute: 0 10*3/uL (ref 0.0–0.7)
Eosinophils Relative: 0 % (ref 0–5)
HCT: 39.1 % (ref 36.0–46.0)
HEMOGLOBIN: 13.3 g/dL (ref 12.0–15.0)
Lymphocytes Relative: 12 % (ref 12–46)
Lymphs Abs: 1.4 10*3/uL (ref 0.7–4.0)
MCH: 31.7 pg (ref 26.0–34.0)
MCHC: 34 g/dL (ref 30.0–36.0)
MCV: 93.3 fL (ref 78.0–100.0)
MONO ABS: 1.4 10*3/uL — AB (ref 0.1–1.0)
MONOS PCT: 12 % (ref 3–12)
NEUTROS ABS: 8.8 10*3/uL — AB (ref 1.7–7.7)
Neutrophils Relative %: 76 % (ref 43–77)
PLATELETS: 236 10*3/uL (ref 150–400)
RBC: 4.19 MIL/uL (ref 3.87–5.11)
RDW: 12.2 % (ref 11.5–15.5)
WBC: 11.6 10*3/uL — ABNORMAL HIGH (ref 4.0–10.5)

## 2014-07-30 LAB — RAPID URINE DRUG SCREEN, HOSP PERFORMED
AMPHETAMINES: NOT DETECTED
Barbiturates: NOT DETECTED
Benzodiazepines: NOT DETECTED
COCAINE: NOT DETECTED
Opiates: NOT DETECTED
TETRAHYDROCANNABINOL: POSITIVE — AB

## 2014-07-30 LAB — ACETAMINOPHEN LEVEL: Acetaminophen (Tylenol), Serum: <10 ug/mL — ABNORMAL LOW (ref 10–30)

## 2014-07-30 LAB — ETHANOL: Alcohol, Ethyl (B): <5 mg/dL (ref <5)

## 2014-07-30 LAB — POC URINE PREG, ED: Preg Test, Ur: NEGATIVE

## 2014-07-30 LAB — SALICYLATE LEVEL

## 2014-07-30 MED ORDER — ZIPRASIDONE MESYLATE 20 MG IM SOLR
20.0000 mg | Freq: Once | INTRAMUSCULAR | Status: AC
  Administered 2014-07-30: 20 mg via INTRAMUSCULAR

## 2014-07-30 MED ORDER — ZIPRASIDONE MESYLATE 20 MG IM SOLR
INTRAMUSCULAR | Status: AC
  Filled 2014-07-30: qty 20

## 2014-07-30 MED ORDER — SODIUM CHLORIDE 0.9 % IV BOLUS (SEPSIS)
1000.0000 mL | Freq: Once | INTRAVENOUS | Status: AC
  Administered 2014-07-30: 1000 mL via INTRAVENOUS

## 2014-07-30 MED ORDER — STERILE WATER FOR INJECTION IJ SOLN
INTRAMUSCULAR | Status: AC
  Filled 2014-07-30: qty 10
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
## 2014-07-30 NOTE — ED Notes (Signed)
Pt is hyper religious, hostile, combative and is not making sense with her speech.  Dr. Florina Ou is at the bedside.  Geodon given.  Waiting to obtain labs, EKG and urine until geodon has taken effect as pt is a danger at this time.
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
## 2014-07-30 NOTE — ED Notes (Signed)
Geodon has now taken effect and pt is cooperating w/ staff. EKG, blood work and IV now in place.

## 2014-07-30 NOTE — ED Notes (Signed)
TTS at bedside. 
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
## 2014-07-30 NOTE — [facility] Suicide Risk Assessment (Cosign Needed)
Suicide Risk Assessment  Discharge Assessment   [facility] Discharge Suicide Risk Assessment   Demographic Factors:  Low socioeconomic status and Unemployed  Total Time spent with patient: 20 minutes  Musculoskeletal: Strength & Muscle Tone: within normal limits Gait & Station: normal Patient leans: N/A  Psychiatric Specialty Exam:     Blood pressure 124/76, pulse 61, resp. rate 16, last menstrual period 06/06/2014, SpO2 100 %.There is no weight on file to calculate BMI.  General Appearance: Casual and Disheveled  Eye Contact::  Good  Speech:  Clear and Coherent and Normal Rate409  Volume:  Normal  Mood:  Euthymic  Affect:  Congruent  Thought Process:  Coherent, Goal Directed and Intact  Orientation:  Full (Time, Place, and Person)  Thought Content:  WDL  Suicidal Thoughts:  No  Homicidal Thoughts:  No  Memory:  Immediate;   Good Recent;   Good Remote;   Good  Judgement:  Fair  Insight:  Fair  Psychomotor Activity:  Normal  Concentration:  Good  Recall:  NA  Fund of Knowledge:Fair  Language: Good  Akathisia:  NA  Handed:  Right  AIMS (if indicated):     Assets:  Desire for Improvement  Sleep:     Cognition: WNL  ADL's:  Impaired      Has this patient used any form of tobacco in the last 30 days? (Cigarettes, Smokeless Tobacco, Cigars, and/or Pipes) Yes, A prescription for an FDA-approved tobacco cessation medication was offered at discharge and the patient refused  Mental Status Per Nursing Assessment::   On Admission:     Current Mental Status by Physician: NA  Loss Factors: NA  Historical Factors: Prior suicide attempts  Risk Reduction Factors:   Religious beliefs about death and Living with another person, especially a relative  Continued Clinical Symptoms:  Bipolar Disorder:   Depressive phase  Cognitive Features That Contribute To Risk:  Closed-mindedness and Polarized thinking    Suicide Risk:  Minimal: No identifiable suicidal ideation.   Patients presenting with no risk factors but with morbid ruminations; may be classified as minimal risk based on the severity of the depressive symptoms  Principal Problem: <principal problem not specified> Discharge Diagnoses:  Patient Active Problem List   Diagnosis Date Noted  . Recurrent UTI [N39.0] 05/15/2013  . Abnormal TSH [R79.89] 05/13/2013  . Acute pyelonephritis [N10] 05/12/2013  . Pyelonephritis [N12] 05/12/2013  . Hypokalemia [E87.6] 05/12/2013  . Near syncope [R55] 05/12/2013  . Polysubstance abuse [F19.10] 05/12/2013  . Severe manic bipolar 1 disorder with psychotic behavior [F31.2] 05/25/2012  . Cannabis abuse [F12.10] 05/25/2012  . Bipolar I disorder with depression [F31.9] 03/17/2012  . PCOS (polycystic ovarian syndrome) [E28.2] 02/26/2012  . Hypomenorrhea/oligomenorrhea [N91.5] 02/26/2012  . Polysubstance dependence, non-opioid, in remission [F19.21] 02/26/2012      Plan Of Care/Follow-up recommendations:  Activity:  as tolerated Diet:  regular  Is patient on multiple antipsychotic therapies at discharge:  No   Has Patient had three or more failed trials of antipsychotic monotherapy by history:  No  Recommended Plan for Multiple Antipsychotic Therapies: NA    Deanda Ruddell C   PMHNP-BC 07/30/2014, 12:13 PM

## 2014-07-31 ENCOUNTER — Emergency Department (HOSPITAL_COMMUNITY)
Admission: EM | Admit: 2014-07-31 | Discharge: 2014-08-01 | Disposition: A | Discharge status: Other Acute Inpatient Hospital | Payer: Medicaid Other | Attending: Emergency Medicine | Admitting: Emergency Medicine

## 2014-07-31 ENCOUNTER — Encounter (HOSPITAL_COMMUNITY): Payer: Self-pay | Admitting: *Deleted

## 2014-07-31 DIAGNOSIS — Z8639 Personal history of other endocrine, nutritional and metabolic disease: Secondary | ICD-10-CM | POA: Insufficient documentation

## 2014-07-31 DIAGNOSIS — F419 Anxiety disorder, unspecified: Secondary | ICD-10-CM | POA: Insufficient documentation

## 2014-07-31 DIAGNOSIS — F3164 Bipolar disorder, current episode mixed, severe, with psychotic features: Secondary | ICD-10-CM | POA: Diagnosis not present

## 2014-07-31 DIAGNOSIS — Z3202 Encounter for pregnancy test, result negative: Secondary | ICD-10-CM | POA: Insufficient documentation

## 2014-07-31 DIAGNOSIS — Z87891 Personal history of nicotine dependence: Secondary | ICD-10-CM | POA: Diagnosis not present

## 2014-07-31 DIAGNOSIS — F319 Bipolar disorder, unspecified: Secondary | ICD-10-CM | POA: Diagnosis present

## 2014-07-31 DIAGNOSIS — F3130 Bipolar disorder, current episode depressed, mild or moderate severity, unspecified: Secondary | ICD-10-CM | POA: Diagnosis not present

## 2014-07-31 DIAGNOSIS — Z872 Personal history of diseases of the skin and subcutaneous tissue: Secondary | ICD-10-CM | POA: Insufficient documentation

## 2014-07-31 DIAGNOSIS — Z9183 Wandering in diseases classified elsewhere: Secondary | ICD-10-CM | POA: Diagnosis present

## 2014-07-31 DIAGNOSIS — Z86018 Personal history of other benign neoplasm: Secondary | ICD-10-CM | POA: Insufficient documentation

## 2014-07-31 DIAGNOSIS — F1210 Cannabis abuse, uncomplicated: Secondary | ICD-10-CM | POA: Insufficient documentation

## 2014-07-31 DIAGNOSIS — Z79899 Other long term (current) drug therapy: Secondary | ICD-10-CM | POA: Insufficient documentation

## 2014-07-31 LAB — URINALYSIS, ROUTINE W REFLEX MICROSCOPIC
GLUCOSE, UA: NEGATIVE mg/dL
Hgb urine dipstick: NEGATIVE
Ketones, ur: 40 mg/dL — AB
Leukocytes, UA: NEGATIVE
NITRITE: NEGATIVE
PH: 5.5 (ref 5.0–8.0)
Protein, ur: NEGATIVE mg/dL
Specific Gravity, Urine: 1.038 — ABNORMAL HIGH (ref 1.005–1.030)
UROBILINOGEN UA: 0.2 mg/dL (ref 0.0–1.0)

## 2014-07-31 LAB — CBC WITH DIFFERENTIAL/PLATELET
BASOS ABS: 0 10*3/uL (ref 0.0–0.1)
BASOS PCT: 0 % (ref 0–1)
Eosinophils Absolute: 0.1 10*3/uL (ref 0.0–0.7)
Eosinophils Relative: 1 % (ref 0–5)
HEMATOCRIT: 35.6 % — AB (ref 36.0–46.0)
HEMOGLOBIN: 12.2 g/dL (ref 12.0–15.0)
LYMPHS PCT: 18 % (ref 12–46)
Lymphs Abs: 1.6 10*3/uL (ref 0.7–4.0)
MCH: 32.2 pg (ref 26.0–34.0)
MCHC: 34.3 g/dL (ref 30.0–36.0)
MCV: 93.9 fL (ref 78.0–100.0)
Monocytes Absolute: 1.2 10*3/uL — ABNORMAL HIGH (ref 0.1–1.0)
Monocytes Relative: 14 % — ABNORMAL HIGH (ref 3–12)
NEUTROS ABS: 6 10*3/uL (ref 1.7–7.7)
NEUTROS PCT: 67 % (ref 43–77)
Platelets: 232 10*3/uL (ref 150–400)
RBC: 3.79 MIL/uL — ABNORMAL LOW (ref 3.87–5.11)
RDW: 12.3 % (ref 11.5–15.5)
WBC: 8.9 10*3/uL (ref 4.0–10.5)

## 2014-07-31 LAB — COMPREHENSIVE METABOLIC PANEL
ALBUMIN: 4.4 g/dL (ref 3.5–5.0)
ALK PHOS: 54 U/L (ref 38–126)
ALT: 43 U/L (ref 14–54)
AST: 87 U/L — ABNORMAL HIGH (ref 15–41)
Anion gap: 8 (ref 5–15)
BUN: 15 mg/dL (ref 6–20)
CO2: 22 mmol/L (ref 22–32)
Calcium: 9.3 mg/dL (ref 8.9–10.3)
Chloride: 111 mmol/L (ref 101–111)
Creatinine, Ser: 0.95 mg/dL (ref 0.44–1.00)
GFR calc Af Amer: >60 mL/min (ref >60)
Glucose, Bld: 90 mg/dL (ref 65–99)
Potassium: 3.8 mmol/L (ref 3.5–5.1)
Sodium: 141 mmol/L (ref 135–145)
Total Bilirubin: 1.5 mg/dL — ABNORMAL HIGH (ref 0.3–1.2)
Total Protein: 7.3 g/dL (ref 6.5–8.1)

## 2014-07-31 LAB — RAPID URINE DRUG SCREEN, HOSP PERFORMED
Amphetamines: NOT DETECTED
BENZODIAZEPINES: NOT DETECTED
Barbiturates: NOT DETECTED
Cocaine: NOT DETECTED
OPIATES: NOT DETECTED
Tetrahydrocannabinol: POSITIVE — AB

## 2014-07-31 LAB — ETHANOL: Alcohol, Ethyl (B): <5 mg/dL (ref <5)

## 2014-07-31 LAB — POC URINE PREG, ED: PREG TEST UR: NEGATIVE

## 2014-07-31 MED ORDER — LORAZEPAM 1 MG PO TABS
2.0000 mg | ORAL_TABLET | Freq: Once | ORAL | Status: DC
  Filled 2014-07-31: qty 2

## 2014-07-31 MED ORDER — DIPHENHYDRAMINE HCL 25 MG PO CAPS
50.0000 mg | ORAL_CAPSULE | Freq: Once | ORAL | Status: AC
  Administered 2014-07-31: 50 mg via ORAL
  Filled 2014-07-31: qty 2

## 2014-07-31 MED ORDER — HALOPERIDOL LACTATE 5 MG/ML IJ SOLN
10.0000 mg | Freq: Once | INTRAMUSCULAR | Status: AC
  Administered 2014-07-31: 10 mg via INTRAMUSCULAR
  Filled 2014-07-31: qty 2

## 2014-07-31 MED ORDER — ESCITALOPRAM OXALATE 10 MG PO TABS
10.0000 mg | ORAL_TABLET | Freq: Every day | ORAL | Status: DC
  Administered 2014-07-31 – 2014-08-01 (×2): 10 mg via ORAL
  Filled 2014-07-31 (×2): qty 1

## 2014-07-31 MED ORDER — TRAZODONE HCL 100 MG PO TABS
100.0000 mg | ORAL_TABLET | Freq: Every day | ORAL | Status: DC
  Filled 2014-07-31: qty 1

## 2014-07-31 MED ORDER — QUETIAPINE FUMARATE 50 MG PO TABS
50.0000 mg | ORAL_TABLET | Freq: Once | ORAL | Status: AC
  Administered 2014-07-31: 50 mg via ORAL
  Filled 2014-07-31: qty 1

## 2014-07-31 MED ORDER — QUETIAPINE FUMARATE ER 400 MG PO TB24
400.0000 mg | ORAL_TABLET | Freq: Every day | ORAL | Status: DC
  Filled 2014-07-31 (×2): qty 1

## 2014-07-31 MED ORDER — LORAZEPAM 2 MG/ML IJ SOLN
2.0000 mg | Freq: Once | INTRAMUSCULAR | Status: AC
  Administered 2014-07-31: 2 mg via INTRAMUSCULAR
  Filled 2014-07-31: qty 1

## 2014-07-31 MED ORDER — HALOPERIDOL LACTATE 5 MG/ML IJ SOLN
10.0000 mg | Freq: Once | INTRAMUSCULAR | Status: DC
  Filled 2014-07-31: qty 2

## 2014-07-31 NOTE — Progress Notes (Addendum)
Pt noted to be sitting in her [facility] room speaking on the telephone. [facility] staff attempting to convince her to return to nursing station. Pt came out door hollering "Please mommy"  Pt stooped this action when Cm offered her a list of [place] self pay resources. Pt thanked CM and prior to Cm attempt to review resources with her she began resume her conversation with whomever was on the phone.  Pt stopped in the middle of [facility] hall way crying out but no tears noted Pt did not follow redirection offered by Cm  Self pay resources for [facility] includes information on self pay pcps, discussed the importance of pcp vs EDP services for f/u care, www.needymeds.org, www.goodrx.com, discounted pharmacies and other [employer] such as [employer], affordable care act,  [place] med assist, financial assistance, self pay dental services, [place] med assist, DSS and  health department Resources for [employer] self pay pcps like [facility] Blossom, family medicine at [employer], [facility], [facility], local urgent care centers, [facility], [facility] family practice, general medical clinics, family services of the [place], [facility] urgent care plus others, medication resources, [facility] out patient pharmacies and housing.

## 2014-07-31 NOTE — ED Notes (Signed)
Pt refused PO Ativan.  Dr. Colin Rhein notified.  Orders received for 10mg  Haldol IM.

## 2014-07-31 NOTE — ED Notes (Signed)
Call from [employer].  They are to be notified when patient is ready for discharge as she will be placed under arrest at that time.
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
## 2014-07-31 NOTE — ED Notes (Signed)
Pt standing at front dest, inquiring about her home meds, pt anxious no distress noted.  Pt singing [place] music, redirected back to room.

## 2014-07-31 NOTE — ED Notes (Signed)
Bed: GK81 Expected date:  Expected time:  Means of arrival:  Comments: Combative
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
## 2014-07-31 NOTE — ED Provider Notes (Signed)
CSN: [id number]     Arrival date & time 07/31/14  1118 History   First MD Initiated Contact with Patient 07/31/14 1121     Chief Complaint  Patient presents with  . wandering in street      (Consider location/radiation/quality/duration/timing/severity/associated sxs/prior Treatment) Patient is a 29 y.o. female presenting with mental health disorder.  Mental Health Problem Presenting symptoms: aggressive behavior   Presenting symptoms comment:  Walking in the street, obstructing traffic Degree of incapacity (severity):  Severe Onset quality:  Sudden Duration:  1 day Timing:  Constant Progression:  Unchanged Chronicity:  Recurrent Context: drug abuse and noncompliance   Treatment compliance:  Some of the time Relieved by:  Nothing Worsened by:  Nothing tried Ineffective treatments:  None tried Associated symptoms: distractible and poor judgment     Past Medical History  Diagnosis Date  . Tumor of ovary   . Headache(784.0)   . Boils   . Polycystic ovarian syndrome   . Mental disorder   . Anxiety    Past Surgical History  Procedure Laterality Date  . Tumor removal     History reviewed. No pertinent family history. History  Substance Use Topics  . Smoking status: Former Smoker -- 0.25 packs/day    Types: Cigarettes  . Smokeless tobacco: Never Used  . Alcohol Use: No     Comment: no alcohol for over a year   OB History    Gravida Para Term Preterm AB TAB SAB Ectopic Multiple Living   0              Review of Systems  Unable to perform ROS: Psychiatric disorder      Allergies  Ibuprofen  Home Medications   Prior to Admission medications   Medication Sig Start Date End Date Taking? Authorizing Provider  escitalopram (LEXAPRO) 10 MG tablet Take 10 mg by mouth daily.   Yes Historical Provider, MD  Prenatal Vit-Fe Fumarate-FA (PRENATAL PO) Take 1 tablet by mouth daily.   Yes Historical Provider, MD  traZODone (DESYREL) 100 MG tablet Take 100 mg by mouth  daily at 12 noon.   Yes Historical Provider, MD  acetaminophen (TYLENOL) 500 MG tablet Take 500 mg by mouth every 6 (six) hours as needed for moderate pain or headache.    Historical Provider, MD   BP 120/83 mmHg  Pulse 86  Temp(Src) 98.1 F (36.7 C) (Oral)  Resp 18  SpO2 97%  LMP 06/06/2014 (Exact Date) Physical Exam  Constitutional: She appears well-developed and well-nourished.  HENT:  Head: Normocephalic and atraumatic.  Right Ear: External ear normal.  Left Ear: External ear normal.  Eyes: Conjunctivae and EOM are normal. Pupils are equal, round, and reactive to light.  Neck: Normal range of motion. Neck supple.  Cardiovascular: Normal rate, regular rhythm, normal heart sounds and intact distal pulses.   Pulmonary/Chest: Effort normal and breath sounds normal.  Abdominal: Soft. Bowel sounds are normal. There is no tenderness.  Musculoskeletal: Normal range of motion.  Neurological: She is alert.  Skin: Skin is warm and dry.  Vitals reviewed.   ED Course  Procedures (including critical care time) Labs Review Labs Reviewed  CBC WITH DIFFERENTIAL/PLATELET - Abnormal; Notable for the following:    RBC 3.79 (*)    HCT 35.6 (*)    Monocytes Relative 14 (*)    Monocytes Absolute 1.2 (*)    All other components within normal limits  COMPREHENSIVE METABOLIC PANEL - Abnormal; Notable for the following:    AST  87 (*)    Total Bilirubin 1.5 (*)    All other components within normal limits  URINE RAPID DRUG SCREEN (HOSP PERFORMED) NOT AT [facility] - Abnormal; Notable for the following:    Tetrahydrocannabinol POSITIVE (*)    All other components within normal limits  URINALYSIS, ROUTINE W REFLEX MICROSCOPIC (NOT AT [facility]) - Abnormal; Notable for the following:    Color, Urine AMBER (*)    Specific Gravity, Urine 1.038 (*)    Bilirubin Urine SMALL (*)    Ketones, ur 40 (*)    All other components within normal limits  ETHANOL  POC URINE PREG, ED    Imaging Review No results  found.   EKG Interpretation None      MDM   Final diagnoses:  None    29 y.o. female with pertinent PMH of bipolar 1, MJ abuse presents with recurrent mania.  She was found wandering in the street and brought in for evaluation.  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  On arrival today pt with no signs of trauma, but tangential and manic.  Given 20 Mg Haldol IM total for sedation.  Wu as above.  Consulted psych.     I have reviewed all laboratory and imaging studies if ordered as above  Psychosis    Debby Freiberg, MD 08/01/14 [phone]

## 2014-07-31 NOTE — ED Notes (Signed)
Unable to collect blood at this time.  

## 2014-08-01 ENCOUNTER — Inpatient Hospital Stay (HOSPITAL_COMMUNITY)
Admission: AD | Admit: 2014-08-01 | Discharge: 2014-08-04 | DRG: 885 | Disposition: A | Discharge status: Home / Self Care | Payer: Federal, State, Local not specified - Other | Source: Intra-hospital | Attending: Psychiatry | Admitting: Psychiatry

## 2014-08-01 ENCOUNTER — Encounter (HOSPITAL_COMMUNITY): Payer: Self-pay | Admitting: *Deleted

## 2014-08-01 DIAGNOSIS — F319 Bipolar disorder, unspecified: Secondary | ICD-10-CM | POA: Diagnosis present

## 2014-08-01 DIAGNOSIS — F3130 Bipolar disorder, current episode depressed, mild or moderate severity, unspecified: Secondary | ICD-10-CM | POA: Diagnosis not present

## 2014-08-01 DIAGNOSIS — F3164 Bipolar disorder, current episode mixed, severe, with psychotic features: Principal | ICD-10-CM | POA: Diagnosis present

## 2014-08-01 DIAGNOSIS — F1220 Cannabis dependence, uncomplicated: Secondary | ICD-10-CM | POA: Diagnosis present

## 2014-08-01 DIAGNOSIS — Z87891 Personal history of nicotine dependence: Secondary | ICD-10-CM | POA: Diagnosis not present

## 2014-08-01 HISTORY — DX: Bipolar disorder, unspecified: F31.9

## 2014-08-01 MED ORDER — ACETAMINOPHEN 325 MG PO TABS: 650.0000 mg | ORAL_TABLET | Freq: Four times a day (QID) | ORAL | Status: DC | PRN

## 2014-08-01 MED ORDER — ALUM & MAG HYDROXIDE-SIMETH 200-200-20 MG/5ML PO SUSP: 30.0000 mL | ORAL | Status: DC | PRN

## 2014-08-01 MED ORDER — TRAZODONE HCL 100 MG PO TABS
100.0000 mg | ORAL_TABLET | Freq: Every evening | ORAL | Status: DC | PRN
  Administered 2014-08-01: 100 mg via ORAL
  Filled 2014-08-01 (×2): qty 1
  Filled 2014-08-01: qty 14

## 2014-08-01 MED ORDER — MAGNESIUM HYDROXIDE 400 MG/5ML PO SUSP: 30.0000 mL | Freq: Every day | ORAL | Status: DC | PRN

## 2014-08-01 MED ORDER — TRAZODONE HCL 100 MG PO TABS: 100.0000 mg | ORAL_TABLET | Freq: Every day | ORAL | Status: DC

## 2014-08-01 MED ORDER — QUETIAPINE FUMARATE 50 MG PO TABS: 50.0000 mg | ORAL_TABLET | Freq: Every day | ORAL | Status: DC

## 2014-08-01 MED ORDER — ESCITALOPRAM OXALATE 10 MG PO TABS
10.0000 mg | ORAL_TABLET | Freq: Every day | ORAL | Status: DC
  Administered 2014-08-02: 10 mg via ORAL
  Filled 2014-08-01 (×3): qty 1

## 2014-08-01 MED ORDER — QUETIAPINE FUMARATE 50 MG PO TABS
50.0000 mg | ORAL_TABLET | Freq: Every day | ORAL | Status: DC
  Administered 2014-08-01: 50 mg via ORAL
  Filled 2014-08-01 (×3): qty 1

## 2014-08-01 MED ORDER — TRAZODONE HCL 100 MG PO TABS: 100.0000 mg | ORAL_TABLET | Freq: Every evening | ORAL | Status: DC | PRN

## 2014-08-01 NOTE — ED Notes (Signed)
[employer] at bedside to transport to [facility].

## 2014-08-01 NOTE — ED Notes (Signed)
[employer] transport requested. 
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
## 2014-08-01 NOTE — ED Notes (Signed)
Patient will be transferring to [facility] after 1900.  Patient assigned bed 505-1.

## 2014-08-01 NOTE — ED Notes (Signed)
Patient up at the nurse's station.  Wants to know what time breakfast is.  Complaining of cold air in room.  Brought patient 2 warm blankets.

## 2014-08-01 NOTE — ED Notes (Signed)
Report called to [facility], pending [employer] transport.

## 2014-08-01 NOTE — Tx Team (Signed)
Initial Interdisciplinary Treatment Plan   PATIENT STRESSORS: Medication change or noncompliance Substance abuse   PATIENT STRENGTHS: Ability for insight Average or above average intelligence Capable of independent living General fund of knowledge Supportive family/friends   PROBLEM LIST: Problem List/Patient Goals Date to be addressed Date deferred Reason deferred Estimated date of resolution  "needed my medications changed" 08/01/14     psychosis 08/01/14     Substance abuse 08/01/14                                          DISCHARGE CRITERIA:  Ability to meet basic life and health needs Improved stabilization in mood, thinking, and/or behavior Verbal commitment to aftercare and medication compliance  PRELIMINARY DISCHARGE PLAN: Attend aftercare/continuing care group Return to previous living arrangement  PATIENT/FAMIILY INVOLVEMENT: This treatment plan has been presented to and reviewed with the patient, [name] [name], and/or family member, .  The patient and family have been given the opportunity to ask questions and make suggestions.  [place], [place] 08/01/2014, 9:26 PM

## 2014-08-01 NOTE — Progress Notes (Signed)
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 She states that she has missed class.  CSW gave the pt a memo on letter head stating that she is currently receiving care and her discharge date is unknown.   CSW reviewed the memo with pt. Patient states that the memo is fine and is approving of it. Patient was appropriate and calm during this interaction.  The pt has been transported to [facility].  Willette Brace [phone] ED CSW 08/01/2014 10:56 PM

## 2014-08-01 NOTE — ED Notes (Signed)
Patient has been up to the nurse's station several times.  She is restless and is finding it difficult to lie down.  She denies SI, stating "no I would never do that!"  "I just want to get my clothes and go to church."  Patient is drowsy and lethargic. She states she don't feel "safe" to lay down and go to sleep.  She has been observed sleeping on her feet.  Patient states she has classes that she needs to attend this week or she'll be dropped.  Patient was advised that social worker is working on finding her a bed for inpatient.  Encouraged patient to lie down and rest.

## 2014-08-01 NOTE — Progress Notes (Signed)
29 year old female involuntarily admitted to [facility] On admission, pt reports that she is at the hospital because she was given the wrong medications and is here to get her medications changed. Pt denies any SI or HI and is able to contract for safety on the unit. Pt denies any alcohol or drug usage and also reports that she no longer smokes. Pt reports that she lives at the [facility] and will go back there upon discharge. Pt reports that she is feeling better today and hopes to be discharged as soon as tomorrow as she reports she is in school and needs to get back to classes. Pt was oriented to unit and safety maintained.

## 2014-08-01 NOTE — Progress Notes (Signed)
Cm spoke with pt again about self pay uninsured pcp services as pt spoke with pt as she was coming out of her room.  Pt states she will follow up and informed Pt states she still have her resources and that she was also given resources by "someone else this morning" Cm she would not stop taking her "medications" Pt reported she has fasted and "people do not understand why or how I do it but I am trying to get right with God. " CM encouraged pt to continue taking her medications and possibly fast from certain foods. Discussed with her that God would understand if she continued her medications as needed Pt thanked CM. Pt pleasant and cooperative Pt went to [place] desk after speaking with CM

## 2014-08-02 ENCOUNTER — Encounter (HOSPITAL_COMMUNITY): Payer: Self-pay | Admitting: Psychiatry

## 2014-08-02 DIAGNOSIS — F3164 Bipolar disorder, current episode mixed, severe, with psychotic features: Principal | ICD-10-CM

## 2014-08-02 DIAGNOSIS — F1220 Cannabis dependence, uncomplicated: Secondary | ICD-10-CM | POA: Diagnosis present

## 2014-08-02 MED ORDER — OLANZAPINE 5 MG PO TABS
5.0000 mg | ORAL_TABLET | Freq: Every day | ORAL | Status: DC
  Administered 2014-08-02 – 2014-08-03 (×2): 5 mg via ORAL
  Filled 2014-08-02 (×3): qty 1

## 2014-08-02 MED ORDER — LAMOTRIGINE 25 MG PO TABS
ORAL_TABLET | ORAL | Status: AC
  Administered 2014-08-02: 25 mg via ORAL
  Filled 2014-08-02: qty 1

## 2014-08-02 MED ORDER — BENZTROPINE MESYLATE 0.5 MG PO TABS
0.5000 mg | ORAL_TABLET | Freq: Every day | ORAL | Status: DC
  Administered 2014-08-02 – 2014-08-03 (×2): 0.5 mg via ORAL
  Filled 2014-08-02 (×3): qty 1

## 2014-08-02 MED ORDER — LAMOTRIGINE 25 MG PO TABS
25.0000 mg | ORAL_TABLET | Freq: Every day | ORAL | Status: DC
  Administered 2014-08-02 – 2014-08-04 (×3): 25 mg via ORAL
  Filled 2014-08-02 (×4): qty 1

## 2014-08-02 NOTE — Progress Notes (Signed)
Report received.  Medications administered to patient. Patient oriented to the unit and food given.
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
## 2014-08-02 NOTE — Plan of Care (Signed)
Problem: Alteration in mood & ability to function due to Goal: STG-Patient will comply with prescribed medication regimen (Patient will comply with prescribed medication regimen)  Outcome: Progressing Pt invested in recovery and takes medications as prescribed
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
## 2014-08-02 NOTE — Progress Notes (Signed)
D:Patient in the hallway on approach.  Patient states she had a bad day.  Patient states she is upset because a man she has been dating the last few years states he does not want her to call him anymore.  Patient states she is heartbroken.  Patient does present as paranoid and at times she is thought blocking. Patient denies SI/HI and denies AVH. A: Staff to monitor Q 15 mins for safety.  Encouragement and support offered.  Scheduled medications administered per orders.  Trazodone administered prn for sleep. R: Patient remains safe on the unit.  Patient did not attend group tonight.  Patient visible on the unit for meds but not interacting with peers.

## 2014-08-02 NOTE — [facility] Suicide Risk Assessment (Signed)
[name] Admission Suicide Risk Assessment   Nursing information obtained from:    Demographic factors:    Current Mental Status:    Loss Factors:    Historical Factors:    Risk Reduction Factors:    Total Time spent with patient: 30 minutes Principal Problem: Bipolar disorder, curr episode mixed, severe, with psychotic features Diagnosis:   Patient Active Problem List   Diagnosis Date Noted  . Bipolar disorder, curr episode mixed, severe, with psychotic features [F31.64] 08/02/2014  . Cannabis use disorder, severe, dependence [F12.20] 08/02/2014  . Recurrent UTI [N39.0] 05/15/2013  . Abnormal TSH [R79.89] 05/13/2013  . Pyelonephritis [N12] 05/12/2013  . Near syncope [R55] 05/12/2013  . PCOS (polycystic ovarian syndrome) [E28.2] 02/26/2012  . Hypomenorrhea/oligomenorrhea [N91.5] 02/26/2012     Continued Clinical Symptoms:  Alcohol Use Disorder Identification Test Final Score (AUDIT): 0 The "Alcohol Use Disorders Identification Test", Guidelines for Use in Primary Care, Second Edition.  World Pharmacologist [facility]). Score between 0-7:  no or low risk or alcohol related problems. Score between 8-15:  moderate risk of alcohol related problems. Score between 16-19:  high risk of alcohol related problems. Score 20 or above:  warrants further diagnostic evaluation for alcohol dependence and treatment.   CLINICAL FACTORS:   Bipolar Disorder:   Mixed State Alcohol/Substance Abuse/Dependencies Unstable or Poor Therapeutic Relationship Previous Psychiatric Diagnoses and Treatments   Musculoskeletal: Strength & Muscle Tone: within normal limits Gait & Station: normal Patient leans: N/A  Psychiatric Specialty Exam: Physical Exam  Review of Systems  Psychiatric/Behavioral: Positive for depression and substance abuse. The patient is nervous/anxious.   All other systems reviewed and are negative.   Blood pressure 118/64, pulse 73, temperature 97.1 F (36.2 C), temperature source  Oral, resp. rate 18, height 5\' 10"  (1.778 m), weight 87.091 kg (192 lb).Body mass index is 27.55 kg/(m^2).                  Please see H&P.                                        COGNITIVE FEATURES THAT CONTRIBUTE TO RISK:  Closed-mindedness, Polarized thinking and Thought constriction (tunnel vision)    SUICIDE RISK:   Moderate:  Frequent suicidal ideation with limited intensity, and duration, some specificity in terms of plans, no associated intent, good self-control, limited dysphoria/symptomatology, some risk factors present, and identifiable protective factors, including available and accessible social support.  PLAN OF CARE: Please see H&P.   Medical Decision Making:  Review of Psycho-Social Stressors (1), Review or order clinical lab tests (1), Review and summation of old records (2), Established Problem, Worsening (2), Review of Last Therapy Session (1), Review of Medication Regimen & Side Effects (2) and Review of New Medication or Change in Dosage (2)  I certify that inpatient services furnished can reasonably be expected to improve the patient's condition.   Sabreen Kitchen MD 08/02/2014, 11:35 AM

## 2014-08-02 NOTE — Progress Notes (Signed)
Patient ID: [name] [name], female   DOB: 08-Feb-1986, 29 y.o.   MRN: [id number]   Pt currently presents with a flat affect and depressed behavior. Per self inventory, pt rates depression, hopelessness and anxiety at a 0. Pt's daily goal is to "*getting belonging situated and get to my mother in [place]" and they intend to do so by "participate actively." Pt reports good sleep, concentration and appetite.  Marland KitchenPt provided with medications per providers orders. Pt's labs and vitals were monitored throughout the day. Pt supported emotionally and encouraged to express concerns and questions. Pt educated on medications. Pt's safety ensured with 15 minute and environmental checks. Pt currently denies SI/HI and A/V hallucinations. Pt verbally agrees to seek staff if SI/HI or A/VH occurs and to consult with staff before acting on these thoughts. Pt reports tearfully to writer today, "I walked a long way to tell my person that I loved him. I took water and candy but he said I shouldn't have walked. He was with someone else. And told me he didn't love me. I just want him to be happy."

## 2014-08-02 NOTE — BHH Group Notes (Signed)
[facility] LCSW Aftercare Discharge Planning Group Note   08/02/2014 3:13 PM  Participation Quality:  Invited.  Was in bed, asleep.    Roque Lias B
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
## 2014-08-03 LAB — LIPID PANEL
Cholesterol: 143 mg/dL (ref 0–200)
HDL: 44 mg/dL (ref >40)
LDL Cholesterol: 83 mg/dL (ref 0–99)
TRIGLYCERIDES: 81 mg/dL (ref <150)
Total CHOL/HDL Ratio: 3.3 RATIO
VLDL: 16 mg/dL (ref 0–40)

## 2014-08-03 LAB — TSH: TSH: 1.297 u[IU]/mL (ref 0.350–4.500)
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
## 2014-08-03 NOTE — BHH Suicide Risk Assessment (Signed)
[place] INPATIENT:  Family/Significant Other Suicide Prevention Education  Suicide Prevention Education:    Pt was not admitted with SI/HI and has not endorsed it throughout admission. CSW spoke with [facility] staff who is agreeable to have Pt back and were pleased to hear that she was beginning to stabilize and doing well.  Peri Maris, Blanket

## 2014-08-03 NOTE — Progress Notes (Addendum)
D:Patient in her room on approach.  Patient states she had a good day but states she continues to be sad.  Patient states her boyfriend broke up with her and she is heartbroken.  Patient states her depression is better today.  Patient states her plan is to go live with her brother when discharged.  Patient denies SI/HI and denies AVH.   A: Staff to monitor Q 15 mins for safety.  Encouragement and support offered.  Scheduled medications administered per orders. R: Patient remains safe on the unit.  Patient attended group tonight.  Patient visible on the unit and interacting with peers.  Patient taking adminisitered medications.  Patient has been pleasant and cooperative.

## 2014-08-03 NOTE — BHH Group Notes (Signed)
[place] Group Notes:  (Counselor/Nursing/MHT/Case Management/Adjunct)  08/03/2014 1:15PM  Type of Therapy:  Group Therapy  Participation Level:  Active  Participation Quality:  Appropriate  Affect:  Flat  Cognitive:  Oriented  Insight:  Improving  Engagement in Group:  Limited  Engagement in Therapy:  Limited  Modes of Intervention:  Discussion, Exploration and Socialization  Summary of Progress/Problems: The topic for group was balance in life.  Pt participated in the discussion about when their life was in balance and out of balance and how this feels.  Pt discussed ways to get back in balance and short term goals they can work on to get where they want to be.  Vicy identified herself a balanced today because her meds are helping and she is not experiencing racing thoughts.  Talked at length about her religion and her involvement in her church and how they help her get to a balanced place.  Also focused on how medication change can be beneficial, and aludded to the importance of accepting mental health issues as an aid to recovery.   Trish Mage 08/03/2014 2:36 PM

## 2014-08-03 NOTE — BHH Group Notes (Signed)
[place] Group Notes:  (Nursing-Leisure and Lifestyle Changes)  Date:  08/03/2014  Time:  0930  Type of Therapy:  Psychoeducational Skills  Participation Level:  Minimal  Participation Quality:  Appropriate, Attentive, Redirectable, Sharing and Supportive  Affect:  Appropriate  Cognitive:  Alert, Appropriate and Oriented  Insight:  Appropriate  Engagement in Group:  Engaged, Improving and Supportive  Modes of Intervention:  Discussion, Education and Support  Summary of Progress/Problems:  [name] [name] 08/03/2014, 0930

## 2014-08-03 NOTE — Plan of Care (Signed)
Problem: Alteration in thought process Goal: LTG-Patient has not harmed self or others in at least 2 days Outcome: Progressing Patient deneis SI/HI and patient verbally contracts for safety.  Also patient has not displayed harmful behavior on the unit. Goal: LTG-Patient verbalizes understanding importance med regimen (Patient verbalizes understanding of importance of medication regimen and need to continue outpatient care.)  Outcome: Progressing Patient states, "I know I need to take my medications so I can be ok." Patient states, "When im off my medications, or if they are not right I do crazy things." Goal: STG-Patient is able to follow short directions Outcome: Completed/Met Date Met:  08/03/14 Patient able to follow simple direction and complies with rules of the unit. Goal: STG-Patient is able to discuss thoughts with staff Outcome: Progressing Patient able to come to staff members and verbalized questions and concerns.   Goal: STG-Patient does not respond to command hallucinations Outcome: Completed/Met Date Met:  08/03/14 Patient denies AVH and does not display responding to command hallucinations

## 2014-08-03 NOTE — Progress Notes (Signed)
D: [name] [name] was polite and reserved upon approach, with blunted affect, when this writer assumed care at 1500. In her self-inventory, she reported good sleep, appetite, and concentration. She rated her depression, anxiety, and feeling of hopelessness a zero. She approached this Probation officer at 1630 to see about meds, but none were due. She indicated today's goal was "having a successful discharge plan" and indicated she wanted to "speak with social worker if possible."  A: No meds due or given. Q15 safety checks maintained. Support/encouragement offered. R: Pt remains free from harm and continues with treatment. Will continue to monitor for needs/safety.

## 2014-08-04 LAB — HEMOGLOBIN A1C
HEMOGLOBIN A1C: 5.3 % (ref 4.8–5.6)
Mean Plasma Glucose: 105 mg/dL

## 2014-08-04 MED ORDER — LAMOTRIGINE 25 MG PO TABS: 25.0000 mg | ORAL_TABLET | Freq: Every day | ORAL | Status: DC

## 2014-08-04 MED ORDER — OLANZAPINE 5 MG PO TABS: 5.0000 mg | ORAL_TABLET | Freq: Every day | ORAL | Status: DC

## 2014-08-04 MED ORDER — BENZTROPINE MESYLATE 0.5 MG PO TABS: 0.5000 mg | ORAL_TABLET | Freq: Every day | ORAL | Status: DC

## 2014-08-04 MED ORDER — TRAZODONE HCL 100 MG PO TABS: 100.0000 mg | ORAL_TABLET | Freq: Every evening | ORAL | Status: DC | PRN

## 2014-08-04 MED ORDER — PRENATAL 27-1 MG PO TABS: 1.0000 | ORAL_TABLET | Freq: Every day | ORAL | Status: DC

## 2014-08-04 NOTE — Progress Notes (Signed)
  [facility] Adult Case Management Discharge Plan :  Will you be returning to the same living situation after discharge:  Yes,  shelter At discharge, do you have transportation home?: Yes,  bus pass Do you have the ability to pay for your medications: Yes,  mental health  Release of information consent forms completed and in the chart;  Patient's signature needed at discharge.  Patient to Follow up at: Follow-up Information    Follow up with [facility].   Specialty:  Behavioral Health   Why:  Please walk-in between 8am-3pm Monday-Friday to be seen by a therapist and doctor.   Contact information:   [place] [address] [phone]       Patient denies SI/HI: Yes,  yes    Safety Planning and Suicide Prevention discussed: Yes,  yes  Have you used any form of tobacco in the last 30 days? (Cigarettes, Smokeless Tobacco, Cigars, and/or Pipes): No  Has patient been referred to the Quitline?: N/A patient is not a smoker  [place], Barbaraann Rondo B 08/04/2014, 11:07 AM

## 2014-08-04 NOTE — Progress Notes (Signed)
Discharge note: Pt discharged per MD order. Discharge instructions given and reviewed with pt. Verbally expresses understanding of discharge information. Denies SI; suicidal precautions reviewed with pt, pt verbally expresses understanding bus passes given to pt. Pt reports she is going to the homeless shelter at discharge and then going to her mother in [place]. RX and sample medication given to pt.Pt verbally reports she received all her belongings out of the locker, signed for belongings. Ambulatory out of facility.
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
## 2014-08-04 NOTE — Discharge Summary (Signed)
Physician Discharge Summary Note  Patient:  [name] [name] is an 29 y.o., female MRN:  [id number] DOB:  [birth year] Patient phone:  [phone] (home)  Patient address:   [address] [place] [address],  Total Time spent with patient: 30 minutes  Date of Admission:  08/01/2014 Date of Discharge: 08/04/14  Reason for Admission:  Bizarre behaviors, Psychosis   Principal Problem: Bipolar disorder, curr episode mixed, severe, with psychotic features Discharge Diagnoses: Patient Active Problem List   Diagnosis Date Noted  . Bipolar disorder, curr episode mixed, severe, with psychotic features [F31.64] 08/02/2014  . Cannabis use disorder, severe, dependence [F12.20] 08/02/2014  . Recurrent UTI [N39.0] 05/15/2013  . PCOS (polycystic ovarian syndrome) [E28.2] 02/26/2012  . Hypomenorrhea/oligomenorrhea [N91.5] 02/26/2012    Musculoskeletal: Strength & Muscle Tone: within normal limits Gait & Station: normal Patient leans: N/A  Psychiatric Specialty Exam: Physical Exam  Psychiatric: She has a normal mood and affect. Her speech is normal and behavior is normal. Thought content normal. Cognition and memory are normal.    Review of Systems  Constitutional: Negative.   HENT: Negative.   Eyes: Negative.   Respiratory: Negative.   Cardiovascular: Negative.   Gastrointestinal: Negative.   Genitourinary: Negative.   Musculoskeletal: Negative.   Skin: Negative.   Neurological: Negative.   Endo/Heme/Allergies: Negative.   Psychiatric/Behavioral: Positive for substance abuse (Positive for marijuana on admission ). Negative for depression, suicidal ideas, hallucinations and memory loss. The patient is not nervous/anxious and does not have insomnia.     Blood pressure 108/66, pulse 64, temperature 98 F (36.7 C), temperature source Oral, resp. rate 18, height 5\' 10"  (1.778 m), weight 87.091 kg (192 lb).Body mass index is 27.55 kg/(m^2).  See Physician SRA     Have you used any  form of tobacco in the last 30 days? (Cigarettes, Smokeless Tobacco, Cigars, and/or Pipes): No  Has this patient used any form of tobacco in the last 30 days? (Cigarettes, Smokeless Tobacco, Cigars, and/or Pipes) No  Past Medical History:  Past Medical History  Diagnosis Date  . Tumor of ovary   . Headache(784.0)   . Boils   . Polycystic ovarian syndrome   . Mental disorder   . Anxiety   . Bipolar disorder     Past Surgical History  Procedure Laterality Date  . Tumor removal     Family History:  Family History  Problem Relation Age of Onset  . Mental illness Mother   . Thyroid disease Mother   . Alcohol abuse Brother    Social History:  History  Alcohol Use No    Comment: no alcohol for over a year     History  Drug Use  . Yes  . Special: Marijuana, Cocaine    Comment: smokes marijuana every day and no cocaine for almost a yearLAST  USE OF COCAINE MARCH,  LAST  MARIJUANA-  YESTERDAY     History   Social History  . Marital Status: Divorced    Spouse Name: N/A  . Number of Children: N/A  . Years of Education: N/A   Social History Main Topics  . Smoking status: Former Smoker -- 0.25 packs/day    Types: Cigarettes  . Smokeless tobacco: Never Used  . Alcohol Use: No     Comment: no alcohol for over a year  . Drug Use: Yes    Special: Marijuana, Cocaine     Comment: smokes marijuana every day and no cocaine for almost a yearLAST  USE OF  COCAINE MARCH,  LAST  MARIJUANA-  YESTERDAY   . Sexual Activity: Yes    Birth Control/ Protection: None   Other Topics Concern  . None   Social History Narrative   Risk to Self: Is patient at risk for suicide?: No What has been your use of drugs/alcohol within the last 12 months?: Pt denies; history of smoking marijuana Risk to Others:   Prior Inpatient Therapy:   Prior Outpatient Therapy:    Level of Care:  OP  Hospital Course:    [name] [name] is an 29 y.o. female who was brought to the emergency department  under IVC by [employer] after "standing in the middle of the street in her socks and bra pretending to "shoot" cars going by with her fingers. [employer] stated that they found her in the middle of [place] when they arrived she was not making sense with her speech. When she was in the police car she tried to kick the windows. She states that she thought someone was holding her brother hostage in the police headquarters." She stated on initial assessments  "I'm not a ghost and I like to sing and dance at the same time". She states that she was discharged from [facility] and when she tried to get back into [employer] she was locked out. She says she walked around for a while and then stayed with her sister for the night. When asked if she could stay with her sister for a while she said "of course". She states that she was yelling on the phone with the pastor of her church when the police starting "chasing her". She currently denies SI, HI and A/V hallucinations however seems to be having some delusions per [employer] report. She admitted to using marijuana daily. She has not been able to consistently take her medication for the past 2 weeks while she has been at [employer].          [name] [name] was admitted to the adult 500 unit where she was evaluated and her symptoms were identified. She was followed during her admission by Dr. Shea Evans and her treatment team.  Medication management was discussed and implemented. Patient was started on medications to stabilizer her mood and treat the underlying psychotic symptoms. She was started on Zyprexa 5 mg at bedtime for psychotic symptoms. Patient was also started on Lamictal 25 mg daily for improved stability of mood with plan for upward gradual increases if there were no reported side effects such as rash. Patient received Trazodone 100 mg at bedtime as needed for insomnia. She was encouraged to participate in unit programming. Medical problems were  identified and treated appropriately. Home medication was restarted as needed.  She was evaluated each day by a clinical provider to ascertain the patient's response to treatment.  Improvement was noted by the patient's report of decreasing symptoms, improved sleep and appetite, affect, medication tolerance, behavior, and participation in unit programming.  The patient was asked each day to complete a self inventory noting mood, mental status, pain, new symptoms, anxiety and concerns.         She responded well to medication and being in a therapeutic and supportive environment. Her thoughts were noted to be much more linear compared to admission and her moods less labile.  Patient was compliant with her medication regimen and denied any adverse effects during daily follow up assessments.  Positive and appropriate behavior was noted and the patient was motivated for  recovery.  She worked closely with the treatment team and case manager to develop a discharge plan with appropriate goals. Coping skills, problem solving as well as relaxation therapies were also part of the unit programming. As treatment progressed the patient reported her level of depression was a zero.          By the day of discharge she was in much improved condition than upon admission.  Symptoms were reported as significantly decreased or resolved completely. The patient denied SI/HI and voiced no AVH. She was motivated to continue taking medication with a goal of continued improvement in mental health. [name] [name] was discharged home with a plan to follow up as noted below. The patient was provided with sample medications and prescriptions at time of discharge. She left [facility] in stable condition with all belongings returned to her.   Consults:  psychiatry  Significant Diagnostic Studies:  Chemistry panel, Lipid profile, CBC, Hemoglobin A1c and TSH WNL, UDS positive for marijuana, EKG  Discharge Vitals:   Blood pressure 108/66,  pulse 64, temperature 98 F (36.7 C), temperature source Oral, resp. rate 18, height 5\' 10"  (1.778 m), weight 87.091 kg (192 lb). Body mass index is 27.55 kg/(m^2). Lab Results:   Results for orders placed or performed during the hospital encounter of 08/01/14 (from the past 72 hour(s))  TSH     Status: None   Collection Time: 08/03/14  6:20 AM  Result Value Ref Range   TSH 1.297 0.350 - 4.500 uIU/mL    Comment: Performed at [facility]  Lipid panel     Status: None   Collection Time: 08/03/14  6:20 AM  Result Value Ref Range   Cholesterol 143 0 - 200 mg/dL   Triglycerides 81 <150 mg/dL   HDL 44 >40 mg/dL   Total CHOL/HDL Ratio 3.3 RATIO   VLDL 16 0 - 40 mg/dL   LDL Cholesterol 83 0 - 99 mg/dL    Comment:        Total Cholesterol/HDL:CHD Risk Coronary Heart Disease Risk Table                     Men   Women  1/2 Average Risk   3.4   3.3  Average Risk       5.0   4.4  2 X Average Risk   9.6   7.1  3 X Average Risk  23.4   11.0        Use the calculated Patient Ratio above and the CHD Risk Table to determine the patient's CHD Risk.        ATP III CLASSIFICATION (LDL):  <100     mg/dL   Optimal  100-129  mg/dL   Near or Above                    Optimal  130-159  mg/dL   Borderline  160-189  mg/dL   High  >190     mg/dL   Very High Performed at [facility]   Hemoglobin A1c     Status: None   Collection Time: 08/03/14  6:20 AM  Result Value Ref Range   Hgb A1c MFr Bld 5.3 4.8 - 5.6 %    Comment: (NOTE)         Pre-diabetes: 5.7 - 6.4         Diabetes: >6.4         Glycemic control for adults with  diabetes: <7.0    Mean Plasma Glucose 105 mg/dL    Comment: (NOTE) Performed At: [facility] [place], [place] [id number] Lindon Romp MD [id number] Performed at [facility]     Physical Findings: AIMS: Facial and Oral Movements Muscles of Facial Expression: None, normal Lips and Perioral  Area: None, normal Jaw: None, normal Tongue: None, normal,Extremity Movements Upper (arms, wrists, hands, fingers): None, normal Lower (legs, knees, ankles, toes): None, normal, Trunk Movements Neck, shoulders, hips: None, normal, Overall Severity Severity of abnormal movements (highest score from questions above): None, normal Incapacitation due to abnormal movements: None, normal Patient's awareness of abnormal movements (rate only patient's report): No Awareness, Dental Status Current problems with teeth and/or dentures?: No Does patient usually wear dentures?: No  CIWA:    COWS:      See Psychiatric Specialty Exam and Suicide Risk Assessment completed by Attending Physician prior to discharge.  Discharge destination:  Home  Is patient on multiple antipsychotic therapies at discharge:  No   Has Patient had three or more failed trials of antipsychotic monotherapy by history:  No  Recommended Plan for Multiple Antipsychotic Therapies: NA     Medication List    STOP taking these medications        escitalopram 10 MG tablet  Commonly known as:  LEXAPRO      TAKE these medications      Indication   acetaminophen 500 MG tablet  Commonly known as:  TYLENOL  Take 500 mg by mouth every 6 (six) hours as needed for moderate pain or headache.      benztropine 0.5 MG tablet  Commonly known as:  COGENTIN  Take 1 tablet (0.5 mg total) by mouth at bedtime.   Indication:  Extrapyramidal Reaction caused by Medications     lamoTRIgine 25 MG tablet  Commonly known as:  LAMICTAL  Take 1 tablet (25 mg total) by mouth daily. For mood stability   Indication:  Manic-Depression     OLANZapine 5 MG tablet  Commonly known as:  ZYPREXA  Take 1 tablet (5 mg total) by mouth at bedtime.   Indication:  Manic-Depression     PRENATAL 27-1 MG Tabs  Take 1 tablet by mouth daily.   Indication:  Vitamin Deficiency     traZODone 100 MG tablet  Commonly known as:  DESYREL  Take 1 tablet (100  mg total) by mouth at bedtime as needed for sleep.   Indication:  Trouble Sleeping       Follow-up Information    Follow up with [facility].   Specialty:  Behavioral Health   Why:  Please walk-in between 8am-3pm Monday-Friday to be seen by a therapist and doctor.   Contact information:   [place] [address] [phone]       Follow-up recommendations:    Take all your medications as prescribed by your mental healthcare provider.  Report any adverse effects and or reactions from your medicines to your outpatient provider promptly.  Patient is instructed and cautioned to not engage in alcohol and or illegal drug use while on prescription medicines.  In the event of worsening symptoms, patient is instructed to call the crisis hotline, 911 and or go to the nearest ED for appropriate evaluation and treatment of symptoms.  Follow-up with your primary care provider for your other medical issues, concerns and or health care needs.   Comments:  Activity: No restrictions Diet: regular Tests: as needed Other:  follow up with after care as scheduled  Total Discharge Time: Greater than 30 minutes  Signed: Lauretta Sallas NP-C 08/04/2014, 3:01 PM

## 2014-08-04 NOTE — Progress Notes (Signed)
The focus of this group is to help patients review their daily goal of treatment and discuss progress on daily workbooks. Pt attended the evening group session and responded to all discussion prompts from the [facility]. Pt shared that today was a good day on the unit, the highlight of which was getting to talk with her family who lives in [place]. Pt's only additional request this evening was to do laundry, which was taken care of following group. [name] [name] told the Writer that she was having trouble accepting that she now needed medication in order to be stable and was also having trouble remembering the incident that brought her here. "I have to go on what people tell me because I don't recall it." Pt was given encouragement to continue with her treatment and focus more on the future, not the past. Pt's affect was appropriate.

## 2014-08-04 NOTE — Tx Team (Signed)
Interdisciplinary Treatment Plan Update (Adult)  Date:  08/04/2014   Time Reviewed:  11:04 AM   Progress in Treatment: Attending groups: Yes. Participating in groups:  Yes. Taking medication as prescribed:  Yes. Tolerating medication:  Yes. Family/Significant othe contact made:  Yes Patient understands diagnosis:  Yes   Discussing patient identified problems/goals with staff:  Yes, see initial care plan. Medical problems stabilized or resolved:  Yes. Denies suicidal/homicidal ideation: Yes. Issues/concerns per patient self-inventory:  No. Other:  New problem(s) identified:  Discharge Plan or Barriers:  Return to shelter, follow up [facility]  Reason for Continuation of Hospitalization:   Comments:  Estimated length of stay:D/C today  New goal(s):  Review of initial/current patient goals per problem list:     Attendees: Patient:  08/04/2014 11:04 AM   Family:   08/04/2014 11:04 AM   Physician:  Ursula Alert, MD 08/04/2014 11:04 AM   Nursing:   Angelina Ok, RN 08/04/2014 11:04 AM   CSW:    Roque Lias, LCSW   08/04/2014 11:04 AM   Other:  08/04/2014 11:04 AM   Other:   08/04/2014 11:04 AM   Other:  Lars Pinks, Nurse CM 08/04/2014 11:04 AM   Other:  Lucinda Dell, [facility] 08/04/2014 11:04 AM   Other:  Norberto Sorenson, [place]  08/04/2014 11:04 AM   Other:  08/04/2014 11:04 AM   Other:  08/04/2014 11:04 AM   Other:  08/04/2014 11:04 AM   Other:  08/04/2014 11:04 AM   Other:  08/04/2014 11:04 AM   Other:   08/04/2014 11:04 AM    Scribe for Treatment Team:   Trish Mage, 08/04/2014 11:04 AM

## 2014-08-04 NOTE — Progress Notes (Signed)
D: Per pt self inventory form pt reports that she slept good last night. She reports a good appetite and normal energy level. She reports depression 0/10, hopelessness 0/10, anxiety 0/10; all on 0-10 scale; 10 being the worse.  She denies SI/HI and A/V/H.  She also denies physical pain. Her behavior is calm and cooperative. Pleasant on approach. Expresses excitement in regards to discharge today.  Pt reports her goal today is "to make sure I have a successful discharge plan."   A: Medication given per MD order. Encouragement and counseling provided. Special checks q 26mins in place for safety.   R: Compliant with medication regimen. Safety maintained.

## 2014-11-21 LAB — PROCEDURE REPORT - SCANNED: Pap: NEGATIVE

## 2015-02-09 ENCOUNTER — Encounter (HOSPITAL_COMMUNITY): Payer: Self-pay | Admitting: *Deleted

## 2015-02-09 ENCOUNTER — Inpatient Hospital Stay (HOSPITAL_COMMUNITY)
Admission: AD | Admit: 2015-02-09 | Discharge: 2015-02-09 | Disposition: A | Discharge status: Home / Self Care | Payer: Medicaid Other | Source: Ambulatory Visit | Attending: Obstetrics & Gynecology | Admitting: Obstetrics & Gynecology

## 2015-02-09 DIAGNOSIS — Z87891 Personal history of nicotine dependence: Secondary | ICD-10-CM | POA: Diagnosis not present

## 2015-02-09 DIAGNOSIS — N83209 Unspecified ovarian cyst, unspecified side: Secondary | ICD-10-CM

## 2015-02-09 DIAGNOSIS — N8302 Follicular cyst of left ovary: Secondary | ICD-10-CM | POA: Diagnosis not present

## 2015-02-09 DIAGNOSIS — E282 Polycystic ovarian syndrome: Secondary | ICD-10-CM | POA: Diagnosis present

## 2015-02-09 LAB — URINALYSIS, ROUTINE W REFLEX MICROSCOPIC
Bilirubin Urine: NEGATIVE
Glucose, UA: NEGATIVE mg/dL
HGB URINE DIPSTICK: NEGATIVE
Ketones, ur: NEGATIVE mg/dL
Leukocytes, UA: NEGATIVE
Nitrite: NEGATIVE
PROTEIN: NEGATIVE mg/dL
SPECIFIC GRAVITY, URINE: 1.025 (ref 1.005–1.030)
pH: 6 (ref 5.0–8.0)

## 2015-02-09 LAB — POCT PREGNANCY, URINE: Preg Test, Ur: NEGATIVE

## 2015-02-09 NOTE — MAU Provider Note (Signed)
History     CSN: [id number]  Arrival date and time: 02/09/15 1422   First Provider Initiated Contact with Patient 02/09/15 1502      No chief complaint on file.  HPI [name] [name] 29 y.o. G0P0 nonpregnant female presents for eval of known diagnosis of PCOS.  She has been pt of Dr. Ruthann Cancer but would like new provider.   She has not seen anyone since May.  She has been told she has PCOS and enlarged ovaries.  At times she feels bloated.  She is not having any new symptoms.  She denies weakness, fever, abdominal pain, vaginal discharge, vaginal bleeding.   OB History    Gravida Para Term Preterm AB TAB SAB Ectopic Multiple Living   0               Past Medical History  Diagnosis Date  . Tumor of ovary   . Headache(784.0)   . Boils   . Polycystic ovarian syndrome   . Mental disorder   . Anxiety   . Bipolar disorder [facility])     Past Surgical History  Procedure Laterality Date  . Tumor removal      Family History  Problem Relation Age of Onset  . Mental illness Mother   . Thyroid disease Mother   . Alcohol abuse Brother     Social History  Substance Use Topics  . Smoking status: Former Smoker -- 0.25 packs/day    Types: Cigarettes  . Smokeless tobacco: Never Used  . Alcohol Use: No     Comment: no alcohol for over a year    Allergies:  Allergies  Allergen Reactions  . Ibuprofen Other (See Comments)    Stomach upset    Prescriptions prior to admission  Medication Sig Dispense Refill Last Dose  . benztropine (COGENTIN) 0.5 MG tablet Take 1 tablet (0.5 mg total) by mouth at bedtime. 30 tablet 0 02/08/2015 at Unknown time  . lamoTRIgine (LAMICTAL) 25 MG tablet Take 1 tablet (25 mg total) by mouth daily. For mood stability 30 tablet 0 02/08/2015 at Unknown time  . OLANZapine (ZYPREXA) 5 MG tablet Take 1 tablet (5 mg total) by mouth at bedtime. 30 tablet 0 02/08/2015 at Unknown time  . acetaminophen (TYLENOL) 500 MG tablet Take 500 mg by mouth every 6 (six)  hours as needed for moderate pain or headache.   unknown at unknown time  . PRENATAL 27-1 MG TABS Take 1 tablet by mouth daily. 30 each 0   . traZODone (DESYREL) 100 MG tablet Take 1 tablet (100 mg total) by mouth at bedtime as needed for sleep. 30 tablet 0     ROS Pertinent ROS in HPI.  All other systems are negative.   Physical Exam   Blood pressure 121/65, pulse 68, temperature 97.5 F (36.4 C), temperature source Oral, resp. rate 16, height 5\' 11"  (1.803 m), weight 236 lb 12.8 oz (107.412 kg), last menstrual period 01/01/2015.  Physical Exam  Constitutional: She is oriented to person, place, and time. She appears well-developed and well-nourished. No distress.  Eyes: Conjunctivae and EOM are normal.  Musculoskeletal: Normal range of motion.  Neurological: She is alert and oriented to person, place, and time.  Skin: Skin is warm and dry.  Psychiatric: She has a normal mood and affect. Her behavior is normal.    MAU Course  Procedures  MDM Vital signs are stable No emergent need for GYN care  Assessment and Plan  A:  1. Cyst of  ovary, unspecified laterality    P: Discharge to home F/u with GYN on outpatient basis.  List of providers given to patient Patient may return to MAU as needed or if her condition were to change or worsen     [name] [name] 02/09/2015, 3:23 PM

## 2015-02-09 NOTE — Discharge Instructions (Signed)
Polycystic Ovarian Syndrome  Polycystic ovarian syndrome (PCOS) is a common hormonal disorder among women of reproductive age. Most women with PCOS grow many small cysts on their ovaries. PCOS can cause problems with your periods and make it difficult to get pregnant. It can also cause an increased risk of miscarriage with pregnancy. If left untreated, PCOS can lead to serious health problems, such as diabetes and heart disease.  CAUSES  The cause of PCOS is not fully understood, but genetics may be a factor.  SIGNS AND SYMPTOMS   · Infrequent or no menstrual periods.    · Inability to get pregnant (infertility) because of not ovulating.    · Increased growth of hair on the face, chest, stomach, back, thumbs, thighs, or toes.    · Acne, oily skin, or dandruff.    · Pelvic pain.    · Weight gain or obesity, usually carrying extra weight around the waist.    · Type 2 diabetes.     · High cholesterol.    · High blood pressure.    · Female-pattern baldness or thinning hair.    · Patches of thickened and dark brown or black skin on the neck, arms, breasts, or thighs.    · Tiny excess flaps of skin (skin tags) in the armpits or neck area.    · Excessive snoring and having breathing stop at times while asleep (sleep apnea).    · Deepening of the voice.    · Gestational diabetes when pregnant.    DIAGNOSIS   There is no single test to diagnose PCOS.   · Your health care provider will:      Take a medical history.      Perform a pelvic exam.      Have ultrasonography done.      Check your female and female hormone levels.      Measure glucose or sugar levels in the blood.      Do other blood tests.    · If you are producing too many female hormones, your health care provider will make sure it is from PCOS. At the physical exam, your health care provider will want to evaluate the areas of increased hair growth. Try to allow natural hair growth for a few days before the visit.    · During a pelvic exam, the ovaries may be enlarged  or swollen because of the increased number of small cysts. This can be seen more easily by using vaginal ultrasonography or screening to examine the ovaries and lining of the uterus (endometrium) for cysts. The uterine lining may become thicker if you have not been having a regular period.    TREATMENT   Because there is no cure for PCOS, it needs to be managed to prevent problems. Treatments are based on your symptoms. Treatment is also based on whether you want to have a baby or whether you need contraception.   Treatment may include:   · Progesterone hormone to start a menstrual period.    · Birth control pills to make you have regular menstrual periods.    · Medicines to make you ovulate, if you want to get pregnant.    · Medicines to control your insulin.    · Medicine to control your blood pressure.    · Medicine and diet to control your high cholesterol and triglycerides in your blood.  · Medicine to reduce excessive hair growth.   · Surgery, making small holes in the ovary, to decrease the amount of female hormone production. This is done through a long, lighted tube (laparoscope) placed into the pelvis through a tiny incision in the lower abdomen.      HOME CARE INSTRUCTIONS  · Only take over-the-counter or prescription medicine as directed by your health care provider.  · Pay attention to the foods you eat and your activity levels. This can help reduce the effects of PCOS.    Keep your weight under control.    Eat foods that are low in carbohydrate and high in fiber.    Exercise regularly.  SEEK MEDICAL CARE IF:  · Your symptoms do not get better with medicine.  · You have new symptoms.     This information is not intended to replace advice given to you by your health care provider. Make sure you discuss any questions you have with your health care provider.     Document Released: 06/13/2004 Document Revised: 12/08/2012 Document Reviewed: 08/05/2012  Elsevier Interactive Patient Education ©2016 [employer]  Inc[employer]

## 2015-02-09 NOTE — MAU Note (Signed)
Pt reports that she has been diagnosed with enlarged ovaries. She had a appointment w/ Dr. Ruthann Cancer in October but does not desire to conitue care with him as her physiciann. She is in the process of finding a new provider to manage her GYN care. But in here in MAU to make sure everything is ok and to find a provider for her care. Pt does not need pain meds at this time.

## 2015-04-14 ENCOUNTER — Emergency Department (INDEPENDENT_AMBULATORY_CARE_PROVIDER_SITE_OTHER)
Admission: EM | Admit: 2015-04-14 | Discharge: 2015-04-14 | Disposition: A | Discharge status: Home / Self Care | Payer: Medicaid Other | Source: Home / Self Care | Attending: Family Medicine | Admitting: Family Medicine

## 2015-04-14 ENCOUNTER — Encounter (HOSPITAL_COMMUNITY): Payer: Self-pay | Admitting: Emergency Medicine

## 2015-04-14 DIAGNOSIS — H65192 Other acute nonsuppurative otitis media, left ear: Secondary | ICD-10-CM

## 2015-04-14 MED ORDER — GUAIFENESIN ER 600 MG PO TB12: 1200.0000 mg | ORAL_TABLET | Freq: Two times a day (BID) | ORAL | Status: DC

## 2015-04-14 MED ORDER — AMOXICILLIN-POT CLAVULANATE 875-125 MG PO TABS: 1.0000 | ORAL_TABLET | Freq: Two times a day (BID) | ORAL | Status: DC

## 2015-04-14 NOTE — ED Provider Notes (Signed)
CSN: [id number]     Arrival date & time 04/14/15  1637 History   First MD Initiated Contact with Patient 04/14/15 1706     Chief Complaint  Patient presents with  . URI   (Consider location/radiation/quality/duration/timing/severity/associated sxs/prior Treatment) Patient is a 30 y.o. female presenting with URI. The history is provided by the patient. No language interpreter was used.  URI Presenting symptoms: congestion, cough, ear pain, fatigue, fever and rhinorrhea   Patient reports feeling ill with cough, L ear pain, chills/subjective fevers for the past 3 days.  Gradual onset, markedly worse last night. Wrapped in blanket with chills. Cough severe.  No N/V/D.  Not taking anything for sxs.  Today was coughing and L ear popped, very painful.  Reports worsening nasal and facial congestion.  Watery rhinorrhea.   Social Hx Smokes a few cigarettes daily.  Friend recently ill with URI sxs.  Patient did not get flu shot this season.   Past Medical History  Diagnosis Date  . Tumor of ovary   . Headache(784.0)   . Boils   . Polycystic ovarian syndrome   . Mental disorder   . Anxiety   . Bipolar disorder [facility])    Past Surgical History  Procedure Laterality Date  . Tumor removal     Family History  Problem Relation Age of Onset  . Mental illness Mother   . Thyroid disease Mother   . Alcohol abuse Brother    Social History  Substance Use Topics  . Smoking status: Former Smoker -- 0.25 packs/day    Types: Cigarettes  . Smokeless tobacco: Never Used  . Alcohol Use: No     Comment: no alcohol for over a year   OB History    Gravida Para Term Preterm AB TAB SAB Ectopic Multiple Living   0              Review of Systems  Constitutional: Positive for fever, chills, appetite change and fatigue. Negative for diaphoresis.  HENT: Positive for congestion, ear pain, rhinorrhea and sinus pressure. Negative for ear discharge and nosebleeds.   Respiratory: Positive for cough. Negative for  chest tightness and shortness of breath.   Cardiovascular: Negative for chest pain.  Gastrointestinal: Negative for nausea, vomiting, abdominal pain, diarrhea and constipation.  All other systems reviewed and are negative.   Allergies  Ibuprofen  Home Medications   Prior to Admission medications   Medication Sig Start Date End Date Taking? Authorizing Provider  acetaminophen (TYLENOL) 500 MG tablet Take 500 mg by mouth every 6 (six) hours as needed for moderate pain or headache.    Historical Provider, MD  amoxicillin-clavulanate (AUGMENTIN) 875-125 MG tablet Take 1 tablet by mouth 2 (two) times daily. 04/14/15   Willeen Niece, MD  benztropine (COGENTIN) 0.5 MG tablet Take 1 tablet (0.5 mg total) by mouth at bedtime. 08/04/14   Niel Hummer, NP  guaiFENesin (MUCINEX) 600 MG 12 hr tablet Take 2 tablets (1,200 mg total) by mouth 2 (two) times daily. 04/14/15   Willeen Niece, MD  lamoTRIgine (LAMICTAL) 25 MG tablet Take 1 tablet (25 mg total) by mouth daily. For mood stability 08/04/14   Niel Hummer, NP  OLANZapine (ZYPREXA) 5 MG tablet Take 1 tablet (5 mg total) by mouth at bedtime. 08/04/14   Niel Hummer, NP  PRENATAL 27-1 MG TABS Take 1 tablet by mouth daily. 08/04/14   Niel Hummer, NP  traZODone (DESYREL) 100 MG tablet Take 1 tablet (100  mg total) by mouth at bedtime as needed for sleep. 08/04/14   Niel Hummer, NP   Meds Ordered and Administered this Visit  Medications - No data to display  BP 139/81 mmHg  Pulse 114  Temp(Src) 99 F (37.2 C) (Oral)  SpO2 96% No data found.   Physical Exam  Constitutional: She appears well-developed and well-nourished. No distress.  HENT:  Head: Normocephalic.  Right Ear: External ear normal.  Patient's L ear TM dull with circumferential redness that extends into EAC. No pain to palpate tragus or pinna of either ear.   Neck: Normal range of motion. Neck supple.  Cardiovascular: Normal rate, regular rhythm and normal heart sounds.    Pulmonary/Chest: No respiratory distress. She has no wheezes. She has no rales. She exhibits no tenderness.  Abdominal: Soft. She exhibits no distension. There is no tenderness. There is no rebound and no guarding.  Lymphadenopathy:    She has no cervical adenopathy.  Skin: She is not diaphoretic.    ED Course  Procedures (including critical care time)  Labs Review Labs Reviewed - No data to display  Imaging Review No results found.   Visual Acuity Review  Right Eye Distance:   Left Eye Distance:   Bilateral Distance:    Right Eye Near:   Left Eye Near:    Bilateral Near:         MDM   1. Acute nonsuppurative otitis media of left ear    URI sxs, with findings c/w L OM. Treatment for OM, symptomatic tx for URI. Follow up prn to [facility]>   Dalbert Mayotte, MD    Willeen Niece, MD 04/14/15 [phone]

## 2015-04-14 NOTE — ED Notes (Signed)
Patient c/o chest and nasal congestion x 3-4 days. Patient is unsure if she has had fever but has had chills and hot flashes. Patient has tried Zyrtec with no relief. Patient is in NAD.

## 2015-04-14 NOTE — Discharge Instructions (Signed)
It was a pleasure to see you today.  I am treating you for an infection in the left ear.  I am prescribing AUGMENTIN 875/125 tablets, take 1 tablet by mouth twice daily for seven days.   MUCINEX 600mg  tablets, take 1-2 tablets by mouth twice daily for cough/congestion.   Tylenol as needed every six hours for aches/pains and chills/fever.   Please return to the Urgent Care Center if you are not improving in the coming 2 days, or if worsening.

## 2015-06-20 IMAGING — US US RENAL
1 series · 14 of 25 positions shown · non-contrast
Comparison: none

[Series 1: us renal · 14 of 31 slices shown]
[im 1/31]
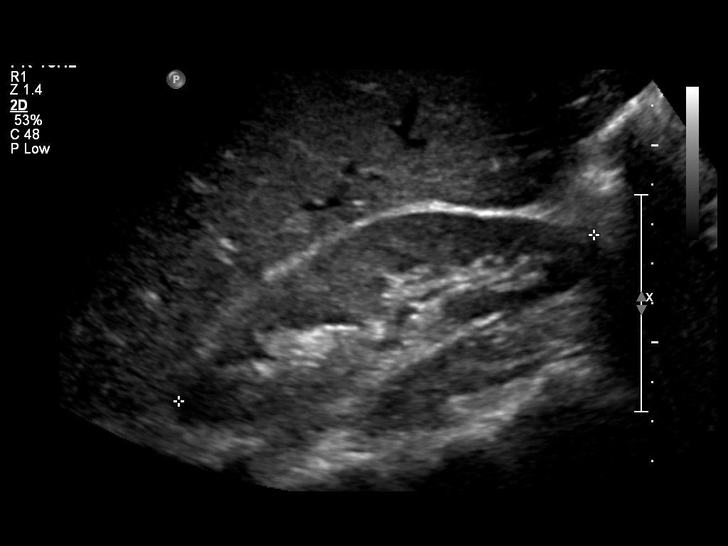
[im 3/31]
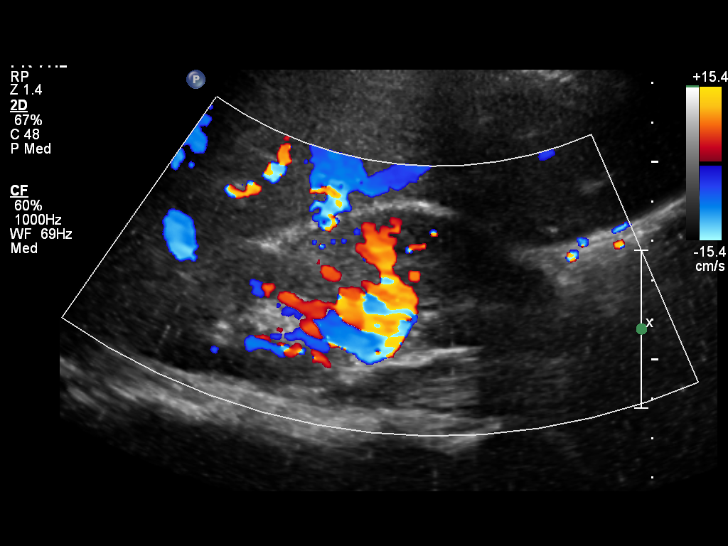
[im 6/31]
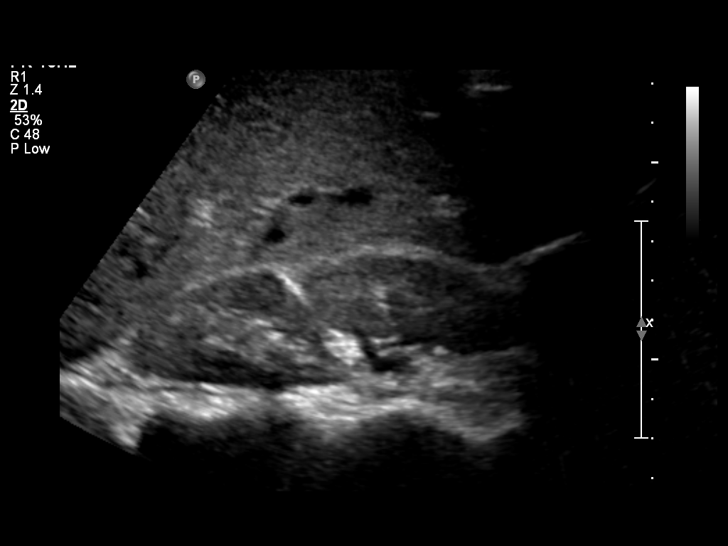
[im 8/31]
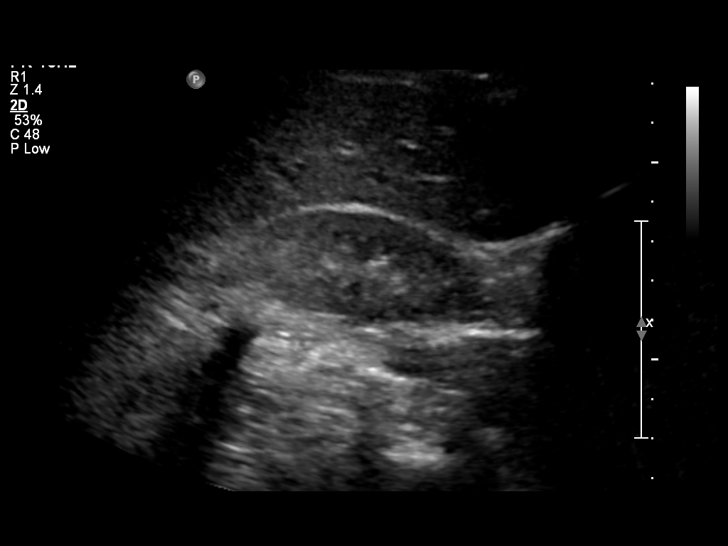
[im 11/31]
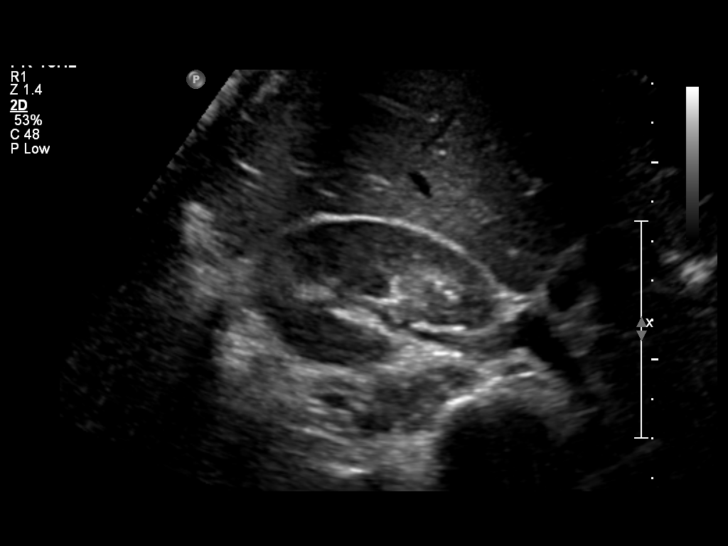
[im 12/31]
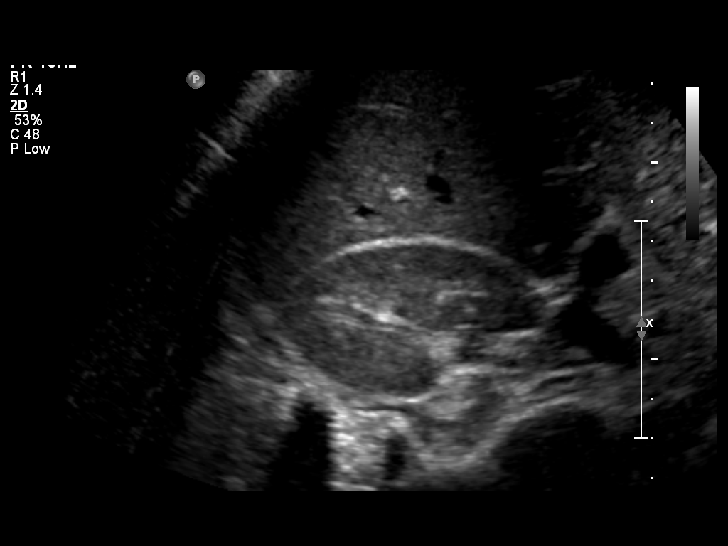
[im 14/31]
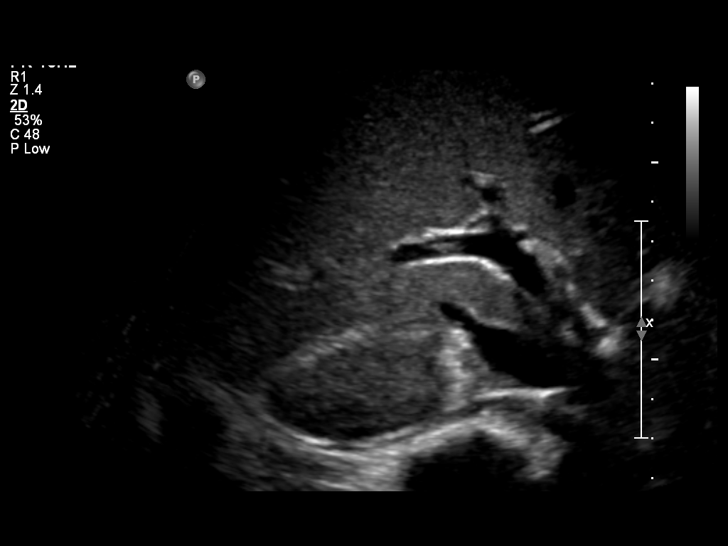
[im 17/31]
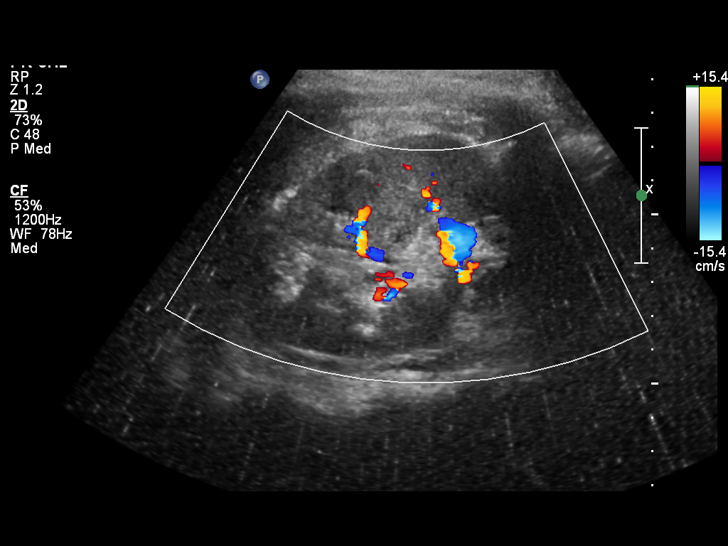
[im 19/31]
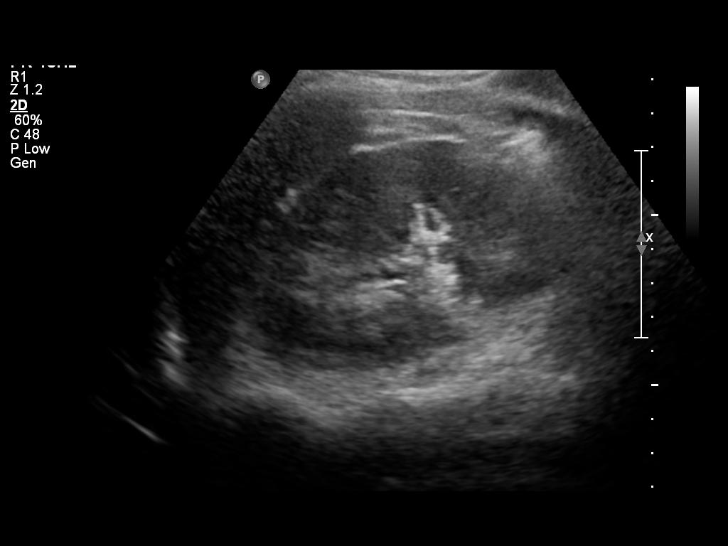
[im 21/31]
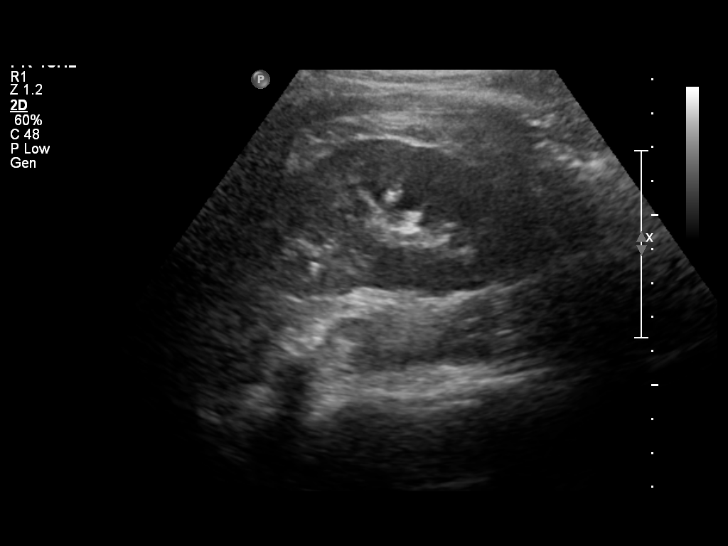
[im 23/31]
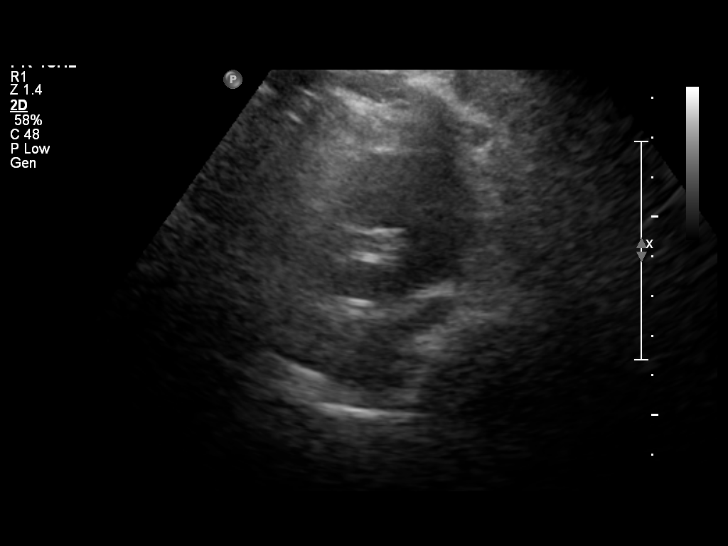
[im 26/31]
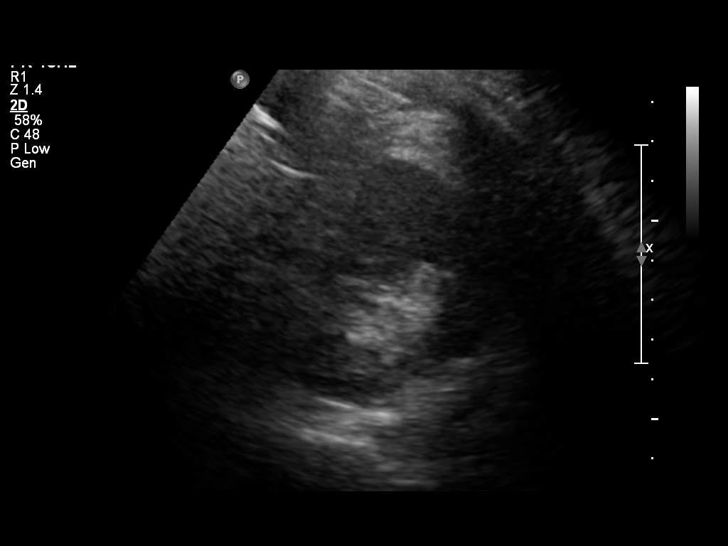
[im 28/31]
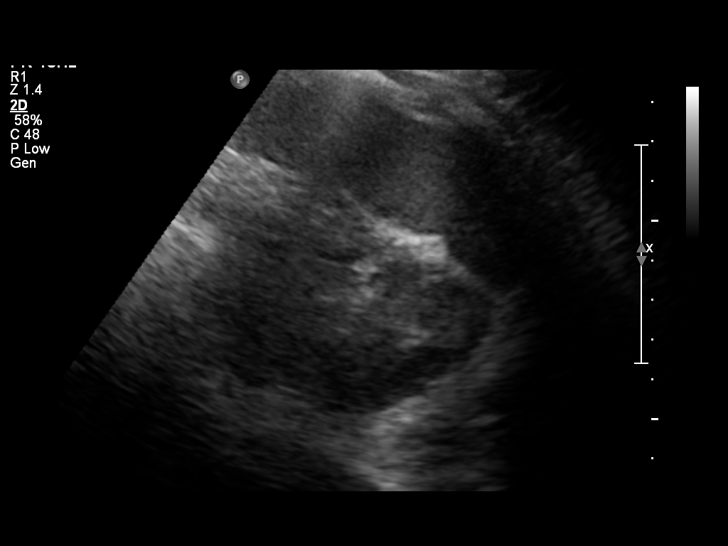
[im 31/31]
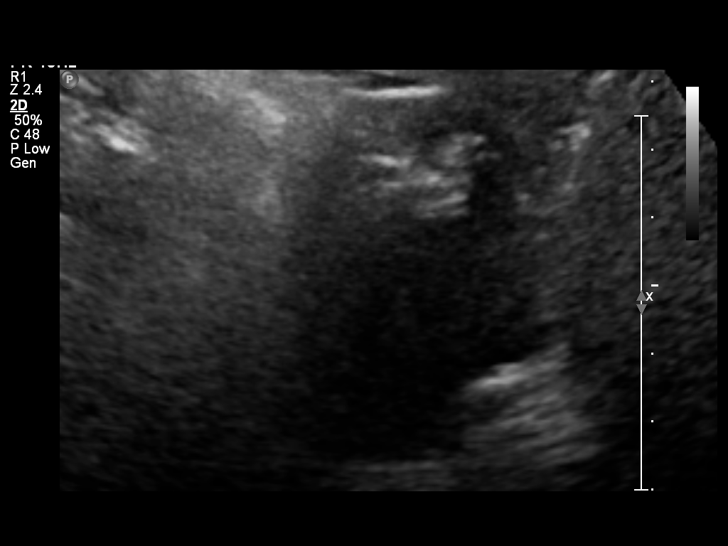

[14 of 25 positions shown; findings below may reference images not displayed]

CLINICAL DATA
UTI, hematuria.

EXAM
RENAL/URINARY TRACT ULTRASOUND COMPLETE

COMPARISON
02/16/2013 CT

FINDINGS
Right Kidney:

Length: 11.3 cm. Echogenicity within normal limits. No mass or
hydronephrosis visualized.

Left Kidney:

Length: 10.7 cm. Echogenicity within normal limits. No mass or
hydronephrosis visualized.

Bladder:

Incompletely distended, limiting evaluation. There is mild wall
thickening.

IMPRESSION
Normal sonographic appearance to the kidneys.  No hydronephrosis.

Mild bladder wall thickening is nonspecific given incomplete
distention. Correlate clinically and with urinalysis for cystitis.

SIGNATURE

## 2015-07-02 ENCOUNTER — Ambulatory Visit (INDEPENDENT_AMBULATORY_CARE_PROVIDER_SITE_OTHER): Payer: Medicaid Other | Admitting: Advanced Practice Midwife

## 2015-07-02 ENCOUNTER — Encounter: Payer: Self-pay | Admitting: Obstetrics & Gynecology

## 2015-07-02 VITALS — BP 119/77 | HR 79 | Wt 231.8 lb

## 2015-07-02 DIAGNOSIS — N910 Primary amenorrhea: Secondary | ICD-10-CM | POA: Diagnosis not present

## 2015-07-02 DIAGNOSIS — E282 Polycystic ovarian syndrome: Secondary | ICD-10-CM

## 2015-07-02 DIAGNOSIS — N83209 Unspecified ovarian cyst, unspecified side: Secondary | ICD-10-CM

## 2015-07-02 MED ORDER — METFORMIN HCL 500 MG PO TABS: 500.0000 mg | ORAL_TABLET | Freq: Two times a day (BID) | ORAL | Status: DC

## 2015-07-02 MED ORDER — MEDROXYPROGESTERONE ACETATE 10 MG PO TABS: 10.0000 mg | ORAL_TABLET | Freq: Every day | ORAL | Status: DC

## 2015-07-02 NOTE — Progress Notes (Signed)
US scheduled for May 4th @ 1300.  Pt notified.  

## 2015-07-04 NOTE — Progress Notes (Signed)
Subjective:     [name] [name] is a 30 y.o. female here for a problem Gyn visit reporting pelvic pain, history of ovarian cysts/PCOS, and no menses in years.  She reports she only has periods when she is given medication.  Her pain is low in her pelvis, on both sides, and has been present intermittently >1 year.  It worsens at times, then improves.  It is not improved by Tylenol, warm bath, heating pad.  She reports she had an ovarian cyst last year and saw Dr Ruthann Cancer but does not want to see him again.  She reports she has never taken medication for her PCOS, although she took a medication to make her have periods at one time. She desires pregnancy so does not want to take OCPs right now.  She denies vaginal bleeding, vaginal itching/burning, urinary symptoms, h/a, dizziness, n/v, or fever/chills.     Gynecologic History No LMP recorded (lmp unknown). Contraception: none Last Pap: 2016 with Dr Ruthann Cancer. Results were: normal   Obstetric History OB History  Gravida Para Term Preterm AB SAB TAB Ectopic Multiple Living  0                  The following portions of the patient's history were reviewed and updated as appropriate: allergies, current medications, past family history, past medical history, past social history, past surgical history and problem list.  Review of Systems Pertinent items noted in HPI and remainder of comprehensive ROS otherwise negative.    Objective:     BP 119/77 mmHg  Pulse 79  Wt 231 lb 12.8 oz (105.144 kg)  LMP  (LMP Unknown) GENERAL: Well-developed, well-nourished female in no acute distress.  HEENT: Normocephalic, atraumatic. Sclerae anicteric.  NECK: Supple. Normal thyroid.  LUNGS: Clear to auscultation bilaterally.  HEART: Regular rate and rhythm. BREASTS: Symmetric in size. No masses, skin changes, nipple drainage, or lymphadenopathy. ABDOMEN: Soft, nontender, nondistended. No organomegaly. PELVIC: Normal external female genitalia. Vagina is  pink and rugated.  Normal discharge. Normal cervix contour. Pap smear obtained. Uterus is normal in size. No adnexal mass or tenderness.  EXTREMITIES: No cyanosis, clubbing, or edema, 2+ distal pulses.     Assessment:      1. Cyst of ovary, unspecified laterality - US Pelvis Complete; Future  2. PCOS (polycystic ovarian syndrome) -Consult Dr Roselie Awkward.  Pt desires fertility so plan for progesterone challenge test and Metformin to regulate menstrual cycle. - metFORMIN (GLUCOPHAGE) 500 MG tablet; Take 1 tablet (500 mg total) by mouth 2 (two) times daily with a meal.  Dispense: 60 tablet; Refill: 5 - medroxyPROGESTERone (PROVERA) 10 MG tablet; Take 1 tablet (10 mg total) by mouth daily. Use for ten days  Dispense: 10 tablet; Refill: 0  3. Primary amenorrhea - medroxyPROGESTERone (PROVERA) 10 MG tablet; Take 1 tablet (10 mg total) by mouth daily. Use for ten days  Dispense: 10 tablet; Refill: 0     Plan:    F/U with outpatient [place] and visit in office in 3 months to evalute amenorrhea

## 2015-07-05 ENCOUNTER — Ambulatory Visit (HOSPITAL_COMMUNITY): Payer: Medicaid Other

## 2015-07-09 ENCOUNTER — Telehealth: Payer: Self-pay | Admitting: *Deleted

## 2015-07-09 ENCOUNTER — Ambulatory Visit (HOSPITAL_COMMUNITY): Payer: Medicaid Other

## 2015-07-09 NOTE — Telephone Encounter (Signed)
Received a call from [facility] requiring orders to be co-signed by Dr. Roselie Awkward for [place] for 07/09/15.  Notified Dr. Roselie Awkward, orders co-signed, called [facility] scheduling  and notifed them.

## 2015-07-11 ENCOUNTER — Ambulatory Visit (HOSPITAL_COMMUNITY)
Admission: RE | Admit: 2015-07-11 | Discharge: 2015-07-11 | Disposition: A | Discharge status: Home / Self Care | Payer: Medicaid Other | Source: Ambulatory Visit | Attending: Obstetrics & Gynecology | Admitting: Obstetrics & Gynecology

## 2015-07-11 ENCOUNTER — Other Ambulatory Visit: Payer: Self-pay | Admitting: Obstetrics & Gynecology

## 2015-07-11 DIAGNOSIS — N83209 Unspecified ovarian cyst, unspecified side: Secondary | ICD-10-CM

## 2015-07-18 ENCOUNTER — Ambulatory Visit (HOSPITAL_COMMUNITY): Admission: RE | Admit: 2015-07-18 | Payer: Medicaid Other | Source: Ambulatory Visit

## 2015-11-11 ENCOUNTER — Emergency Department (HOSPITAL_COMMUNITY)
Admission: EM | Admit: 2015-11-11 | Discharge: 2015-11-12 | Disposition: A | Discharge status: Home / Self Care | Payer: Medicaid Other | Attending: Emergency Medicine | Admitting: Emergency Medicine

## 2015-11-11 ENCOUNTER — Encounter (HOSPITAL_COMMUNITY): Payer: Self-pay

## 2015-11-11 DIAGNOSIS — F1490 Cocaine use, unspecified, uncomplicated: Secondary | ICD-10-CM | POA: Diagnosis not present

## 2015-11-11 DIAGNOSIS — F1290 Cannabis use, unspecified, uncomplicated: Secondary | ICD-10-CM | POA: Insufficient documentation

## 2015-11-11 DIAGNOSIS — F3170 Bipolar disorder, currently in remission, most recent episode unspecified: Secondary | ICD-10-CM

## 2015-11-11 DIAGNOSIS — Z79899 Other long term (current) drug therapy: Secondary | ICD-10-CM | POA: Insufficient documentation

## 2015-11-11 DIAGNOSIS — Z8543 Personal history of malignant neoplasm of ovary: Secondary | ICD-10-CM | POA: Diagnosis not present

## 2015-11-11 DIAGNOSIS — Z87891 Personal history of nicotine dependence: Secondary | ICD-10-CM | POA: Insufficient documentation

## 2015-11-11 DIAGNOSIS — Z7984 Long term (current) use of oral hypoglycemic drugs: Secondary | ICD-10-CM | POA: Diagnosis not present

## 2015-11-11 DIAGNOSIS — F23 Brief psychotic disorder: Secondary | ICD-10-CM

## 2015-11-11 DIAGNOSIS — F319 Bipolar disorder, unspecified: Secondary | ICD-10-CM | POA: Diagnosis present

## 2015-11-11 DIAGNOSIS — R451 Restlessness and agitation: Secondary | ICD-10-CM | POA: Diagnosis present

## 2015-11-11 LAB — CBC WITH DIFFERENTIAL/PLATELET
Basophils Absolute: 0 10*3/uL (ref 0.0–0.1)
Basophils Relative: 0 %
EOS ABS: 0 10*3/uL (ref 0.0–0.7)
Eosinophils Relative: 0 %
HCT: 38.3 % (ref 36.0–46.0)
Hemoglobin: 13.4 g/dL (ref 12.0–15.0)
LYMPHS ABS: 1.5 10*3/uL (ref 0.7–4.0)
Lymphocytes Relative: 12 %
MCH: 32.6 pg (ref 26.0–34.0)
MCHC: 35 g/dL (ref 30.0–36.0)
MCV: 93.2 fL (ref 78.0–100.0)
Monocytes Absolute: 1.2 10*3/uL — ABNORMAL HIGH (ref 0.1–1.0)
Monocytes Relative: 9 %
NEUTROS PCT: 79 %
Neutro Abs: 10.1 10*3/uL — ABNORMAL HIGH (ref 1.7–7.7)
PLATELETS: 290 10*3/uL (ref 150–400)
RBC: 4.11 MIL/uL (ref 3.87–5.11)
RDW: 12.5 % (ref 11.5–15.5)
WBC: 12.8 10*3/uL — AB (ref 4.0–10.5)

## 2015-11-11 LAB — COMPREHENSIVE METABOLIC PANEL
ALT: 26 U/L (ref 14–54)
AST: 46 U/L — ABNORMAL HIGH (ref 15–41)
Albumin: 4.7 g/dL (ref 3.5–5.0)
Alkaline Phosphatase: 61 U/L (ref 38–126)
Anion gap: 13 (ref 5–15)
BILIRUBIN TOTAL: 2 mg/dL — AB (ref 0.3–1.2)
BUN: 16 mg/dL (ref 6–20)
CHLORIDE: 106 mmol/L (ref 101–111)
CO2: 19 mmol/L — ABNORMAL LOW (ref 22–32)
CREATININE: 1.16 mg/dL — AB (ref 0.44–1.00)
Calcium: 9.6 mg/dL (ref 8.9–10.3)
Glucose, Bld: 75 mg/dL (ref 65–99)
POTASSIUM: 3.5 mmol/L (ref 3.5–5.1)
Sodium: 138 mmol/L (ref 135–145)
TOTAL PROTEIN: 8.2 g/dL — AB (ref 6.5–8.1)

## 2015-11-11 LAB — ETHANOL

## 2015-11-11 MED ORDER — LORAZEPAM 1 MG PO TABS
2.0000 mg | ORAL_TABLET | Freq: Once | ORAL | Status: AC
  Administered 2015-11-11: 2 mg via ORAL
  Filled 2015-11-11: qty 2

## 2015-11-11 MED ORDER — ZIPRASIDONE MESYLATE 20 MG IM SOLR
20.0000 mg | Freq: Once | INTRAMUSCULAR | Status: AC
  Administered 2015-11-11: 20 mg via INTRAMUSCULAR
  Filled 2015-11-11: qty 20

## 2015-11-11 MED ORDER — OLANZAPINE 10 MG PO TBDP: 10.0000 mg | ORAL_TABLET | Freq: Every day | ORAL | Status: DC

## 2015-11-11 NOTE — ED Notes (Signed)
Pt up c/o feeling hot, sweating and want change of scrubs. Scrubs given

## 2015-11-11 NOTE — ED Notes (Signed)
Sitting in room watching tv, nad.

## 2015-11-11 NOTE — ED Notes (Addendum)
Pt ambulatory w/o difficulty to room 38 .  Pt's suitcase wanded by security

## 2015-11-11 NOTE — ED Notes (Signed)
On the phone 

## 2015-11-11 NOTE — ED Notes (Signed)
Pt Aunt [phone]

## 2015-11-11 NOTE — ED Triage Notes (Signed)
MD at bedside assessing pt.  Pt agitated and hyper religious at this time. Pt was in lobby and pulled wig off another patient and called her a witch.  Wig was purple.

## 2015-11-11 NOTE — ED Notes (Signed)
Pt religiously preoccupied stating she wants to get rid of devils. Pt had a cup of water in hand tired to pour on another pt to "bless her". Pt angry that staff because she wanted to discharge tonight. Pt physically and verbally aggressive hitting on glass window. New order of Geodon 20 IM given. Pt walked out of room naked. Pt encouraged to stay in room and rest.

## 2015-11-11 NOTE — ED Notes (Signed)
Talking w/ dr Laneta Simmers in hall, religioulsy focused, verbal outbursts, cursing at times

## 2015-11-11 NOTE — ED Notes (Signed)
Pt given multiple snacks/sandwich. Pt states she has not eaten in 6 days. Pt also has been to restroom x 2 and urine? Sample appears pure water and disposed.

## 2015-11-11 NOTE — ED Notes (Signed)
While pt was getting into scrubs.  Notified patient that jewelry will need removed.  Pt ripped necklace and wrist bracelet.  Both clasp are broken. Event witnessed by Raeanne Barry, NT

## 2015-11-11 NOTE — ED Notes (Signed)
Dr Laneta Simmers here to see

## 2015-11-11 NOTE — ED Notes (Signed)
Asked pt to provide urine specimen. Pt went to restroom and gave me a specimen cup full of water. RN notified.

## 2015-11-11 NOTE — ED Notes (Signed)
Up to the bathroom 

## 2015-11-11 NOTE — ED Notes (Signed)
Up in hall yelling, pt concerned about other pt in dept and was trying to "bless" her buy pour water on her.  Pt redirected back to room, but reports that if she is not "out of here by 8:00" she is going to start tearing things up.  Dr Laneta Simmers to eval.

## 2015-11-11 NOTE — ED Provider Notes (Signed)
[facility] DEPT Provider Note   CSN: [id number] Arrival date & time: 11/11/15  1543     History   Chief Complaint Chief Complaint  Patient presents with  . Agitation    HPI [name] [name] is a 30 y.o. female.  Level V caveat- agitated, confused  HPI  Past Medical History:  Diagnosis Date  . Anxiety   . Bipolar disorder ([place])   . Boils   . Headache(784.0)   . Mental disorder   . Polycystic ovarian syndrome   . Tumor of ovary     Patient Active Problem List   Diagnosis Date Noted  . Bipolar disorder, curr episode mixed, severe, with psychotic features ([place]) 08/02/2014  . Cannabis use disorder, severe, dependence ([place]) 08/02/2014  . Recurrent UTI 05/15/2013  . PCOS (polycystic ovarian syndrome) 02/26/2012  . Hypomenorrhea/oligomenorrhea 02/26/2012    Past Surgical History:  Procedure Laterality Date  . TUMOR REMOVAL      OB History    Gravida Para Term Preterm AB Living   0             SAB TAB Ectopic Multiple Live Births                   Home Medications    Prior to Admission medications   Medication Sig Start Date End Date Taking? Authorizing Provider  benztropine (COGENTIN) 0.5 MG tablet Take 1 tablet (0.5 mg total) by mouth at bedtime. 08/04/14  Yes Niel Hummer, NP  escitalopram (LEXAPRO) 10 MG tablet Take 10 mg by mouth daily.   Yes Historical Provider, MD  lamoTRIgine (LAMICTAL) 25 MG tablet Take 1 tablet (25 mg total) by mouth daily. For mood stability 08/04/14  Yes Niel Hummer, NP  metFORMIN (GLUCOPHAGE) 500 MG tablet Take 1 tablet (500 mg total) by mouth 2 (two) times daily with a meal. 07/02/15  Yes Lisa A Leftwich-Kirby, CNM  OLANZapine (ZYPREXA) 5 MG tablet Take 1 tablet (5 mg total) by mouth at bedtime. 08/04/14  Yes Niel Hummer, NP  PRENATAL 27-1 MG TABS Take 1 tablet by mouth daily. 08/04/14  Yes Niel Hummer, NP  traZODone (DESYREL) 100 MG tablet Take 1 tablet (100 mg total) by mouth at bedtime as needed for sleep. 08/04/14  Yes  Niel Hummer, NP  acetaminophen (TYLENOL) 500 MG tablet Take 500 mg by mouth every 6 (six) hours as needed for moderate pain or headache. Reported on 07/02/2015    Historical Provider, MD  guaiFENesin (MUCINEX) 600 MG 12 hr tablet Take 2 tablets (1,200 mg total) by mouth 2 (two) times daily. Patient not taking: Reported on 07/02/2015 04/14/15   Willeen Niece, MD  medroxyPROGESTERone (PROVERA) 10 MG tablet Take 1 tablet (10 mg total) by mouth daily. Use for ten days Patient not taking: Reported on 11/11/2015 07/02/15   Elvera Maria, CNM    Family History Family History  Problem Relation Age of Onset  . Mental illness Mother   . Thyroid disease Mother   . Alcohol abuse Brother     Social History Social History  Substance Use Topics  . Smoking status: Former Smoker    Packs/day: 0.25    Types: Cigarettes  . Smokeless tobacco: Never Used  . Alcohol use No     Comment: no alcohol for over a year     Allergies   Ibuprofen   Review of Systems Review of Systems  Unable to perform ROS: Mental status change  Physical Exam Updated Vital Signs BP (!) 154/109 (BP Location: Right Arm)   Pulse 103   Temp 98 F (36.7 C) (Oral)   Resp 18   SpO2 100%   Physical Exam  Constitutional: She appears well-developed and well-nourished.  HENT:  Head: Normocephalic and atraumatic.  Eyes: Conjunctivae and EOM are normal. Pupils are equal, round, and reactive to light.  Neck: Normal range of motion and phonation normal. Neck supple.  Cardiovascular: Normal rate.   Pulmonary/Chest: Effort normal.  Musculoskeletal: Normal range of motion.  Neurological: She is alert. She exhibits normal muscle tone.  Skin: Skin is warm and dry.  Psychiatric:  Agitated, defensive, tearful, hyperreligious, racing thoughts and statements.  Nursing note and vitals reviewed.    ED Treatments / Results  Labs (all labs ordered are listed, but only abnormal results are displayed) Labs Reviewed    COMPREHENSIVE METABOLIC PANEL - Abnormal; Notable for the following:       Result Value   CO2 19 (*)    Creatinine, Ser 1.16 (*)    Total Protein 8.2 (*)    AST 46 (*)    Total Bilirubin 2.0 (*)    All other components within normal limits  CBC WITH DIFFERENTIAL/PLATELET - Abnormal; Notable for the following:    WBC 12.8 (*)    Neutro Abs 10.1 (*)    Monocytes Absolute 1.2 (*)    All other components within normal limits  ETHANOL  URINE RAPID DRUG SCREEN, HOSP PERFORMED    EKG  EKG Interpretation None       Radiology No results found.  Procedures Procedures (including critical care time)  Medications Ordered in ED Medications  OLANZapine zydis (ZYPREXA) disintegrating tablet 10 mg (not administered)  LORazepam (ATIVAN) tablet 2 mg (2 mg Oral Given 11/11/15 1617)  ziprasidone (GEODON) injection 20 mg (20 mg Intramuscular Given 11/11/15 2018)     Initial Impression / Assessment and Plan / ED Course  I have reviewed the triage vital signs and the nursing notes.  Pertinent labs & imaging results that were available during my care of the patient were reviewed by me and considered in my medical decision making (see chart for details).  Clinical Course   Medications  OLANZapine zydis (ZYPREXA) disintegrating tablet 10 mg (not administered)  LORazepam (ATIVAN) tablet 2 mg (2 mg Oral Given 11/11/15 1617)  ziprasidone (GEODON) injection 20 mg (20 mg Intramuscular Given 11/11/15 2018)    Patient Vitals for the past 24 hrs:  BP Temp Temp src Pulse Resp SpO2  11/11/15 2109 (!) 154/109 98 F (36.7 C) Oral 103 18 100 %  11/11/15 1742 122/73 97.6 F (36.4 C) Oral 109 16 100 %  11/11/15 1601 124/94 97.7 F (36.5 C) Oral 108 18 100 %    9:39 PM Reevaluation with update and discussion. After initial assessment and treatment, an updated evaluation reveals No change in clinical status. [name] L    TTS consultation  Final Clinical Impressions(s) / ED Diagnoses    Final diagnoses:  Acute psychosis   Psychosis, cause not clear. She will require stabilization and evaluation by psychiatry  Nursing Notes Reviewed/ Care Coordinated, and agree without changes. Applicable Imaging Reviewed.  Interpretation of Laboratory Data incorporated into ED treatment  Plan- as per TTS in conjunction with oncoming provider team   New Prescriptions New Prescriptions   No medications on file     Daleen Bo, MD 11/11/15 2341

## 2015-11-12 DIAGNOSIS — F319 Bipolar disorder, unspecified: Secondary | ICD-10-CM | POA: Diagnosis present

## 2015-11-12 LAB — RAPID URINE DRUG SCREEN, HOSP PERFORMED
Amphetamines: NOT DETECTED
Barbiturates: NOT DETECTED
Benzodiazepines: NOT DETECTED
Cocaine: NOT DETECTED
OPIATES: NOT DETECTED
Tetrahydrocannabinol: POSITIVE — AB

## 2015-11-12 NOTE — ED Notes (Signed)
Pt sleeping in bed

## 2015-11-12 NOTE — ED Notes (Signed)
Pt given cup for urine sample. Pt filled it with water and return to Probation officer

## 2015-11-12 NOTE — ED Notes (Signed)
Pt is very manic this morning.  Singing very loudly.  She was very agitated after staff member wiped her room down with sani wipes and did not do everyones room.  She claims that the staff member was showing favoritism and being prejudice.  She was very agitated over that.  She refused to listen to me introduce myself as she doesn't believe I am her nurse.

## 2015-11-12 NOTE — ED Notes (Signed)
Pt discharged ambulatory with discharge instructions and bus pass.  All belongings were returned to pt.

## 2015-11-12 NOTE — ED Notes (Signed)
Pt pacing refusing to go to bed. Pt stated she can't sleep. Pt given snack as requested

## 2015-11-12 NOTE — ED Notes (Signed)
Pt up pacing hallway. Denies any needs
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
## 2015-11-28 ENCOUNTER — Encounter: Payer: Self-pay | Admitting: *Deleted

## 2015-12-14 ENCOUNTER — Emergency Department (HOSPITAL_COMMUNITY)
Admission: EM | Admit: 2015-12-14 | Discharge: 2015-12-17 | Disposition: A | Discharge status: Home / Self Care | Payer: Medicaid Other | Attending: Emergency Medicine | Admitting: Emergency Medicine

## 2015-12-14 ENCOUNTER — Encounter (HOSPITAL_COMMUNITY): Payer: Self-pay

## 2015-12-14 DIAGNOSIS — Z811 Family history of alcohol abuse and dependence: Secondary | ICD-10-CM | POA: Diagnosis not present

## 2015-12-14 DIAGNOSIS — F3164 Bipolar disorder, current episode mixed, severe, with psychotic features: Secondary | ICD-10-CM | POA: Diagnosis present

## 2015-12-14 DIAGNOSIS — Z87891 Personal history of nicotine dependence: Secondary | ICD-10-CM | POA: Diagnosis not present

## 2015-12-14 DIAGNOSIS — Z818 Family history of other mental and behavioral disorders: Secondary | ICD-10-CM | POA: Diagnosis not present

## 2015-12-14 DIAGNOSIS — F988 Other specified behavioral and emotional disorders with onset usually occurring in childhood and adolescence: Secondary | ICD-10-CM | POA: Diagnosis present

## 2015-12-14 DIAGNOSIS — R4689 Other symptoms and signs involving appearance and behavior: Secondary | ICD-10-CM

## 2015-12-14 DIAGNOSIS — F1910 Other psychoactive substance abuse, uncomplicated: Secondary | ICD-10-CM

## 2015-12-14 DIAGNOSIS — Z888 Allergy status to other drugs, medicaments and biological substances status: Secondary | ICD-10-CM | POA: Diagnosis not present

## 2015-12-14 DIAGNOSIS — Z8489 Family history of other specified conditions: Secondary | ICD-10-CM | POA: Diagnosis not present

## 2015-12-14 MED ORDER — NICOTINE 21 MG/24HR TD PT24: 21.0000 mg | MEDICATED_PATCH | Freq: Every day | TRANSDERMAL | Status: DC

## 2015-12-14 MED ORDER — LAMOTRIGINE 25 MG PO TABS
25.0000 mg | ORAL_TABLET | Freq: Every day | ORAL | Status: DC
  Filled 2015-12-14: qty 1

## 2015-12-14 MED ORDER — ZIPRASIDONE MESYLATE 20 MG IM SOLR
10.0000 mg | Freq: Once | INTRAMUSCULAR | Status: AC
  Administered 2015-12-15: 10 mg via INTRAMUSCULAR
  Filled 2015-12-14: qty 20

## 2015-12-14 MED ORDER — LORAZEPAM 1 MG PO TABS: 1.0000 mg | ORAL_TABLET | Freq: Three times a day (TID) | ORAL | Status: DC | PRN

## 2015-12-14 MED ORDER — ALUM & MAG HYDROXIDE-SIMETH 200-200-20 MG/5ML PO SUSP: 30.0000 mL | ORAL | Status: DC | PRN

## 2015-12-14 MED ORDER — ESCITALOPRAM OXALATE 10 MG PO TABS
10.0000 mg | ORAL_TABLET | Freq: Every day | ORAL | Status: DC
  Filled 2015-12-14: qty 1

## 2015-12-14 MED ORDER — ACETAMINOPHEN 325 MG PO TABS: 650.0000 mg | ORAL_TABLET | ORAL | Status: DC | PRN

## 2015-12-14 MED ORDER — METFORMIN HCL 500 MG PO TABS
500.0000 mg | ORAL_TABLET | Freq: Two times a day (BID) | ORAL | Status: DC
  Filled 2015-12-14: qty 1

## 2015-12-14 MED ORDER — OLANZAPINE 5 MG PO TABS: 5.0000 mg | ORAL_TABLET | Freq: Every day | ORAL | Status: DC

## 2015-12-14 MED ORDER — HALOPERIDOL LACTATE 5 MG/ML IJ SOLN
5.0000 mg | Freq: Once | INTRAMUSCULAR | Status: AC
  Administered 2015-12-15: 5 mg via INTRAMUSCULAR
  Filled 2015-12-14: qty 1

## 2015-12-14 MED ORDER — ONDANSETRON HCL 4 MG PO TABS: 4.0000 mg | ORAL_TABLET | Freq: Three times a day (TID) | ORAL | Status: DC | PRN

## 2015-12-14 MED ORDER — ZOLPIDEM TARTRATE 5 MG PO TABS: 5.0000 mg | ORAL_TABLET | Freq: Every evening | ORAL | Status: DC | PRN

## 2015-12-14 MED ORDER — RISPERIDONE 1 MG PO TABS
2.0000 mg | ORAL_TABLET | Freq: Every day | ORAL | Status: DC
  Filled 2015-12-14: qty 2

## 2015-12-14 NOTE — ED Notes (Signed)
Patient stated she was not going to change until she sees the doctor again.  PA notified and into the room.

## 2015-12-14 NOTE — ED Notes (Signed)
Patient unwilling to change clothing.  Explained to her that it is policy to change into scrubs then states she wants blue scrubs. Rants about numerous things, unable to keep on track.  Security called come and help with patient.
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
## 2015-12-14 NOTE — ED Triage Notes (Signed)
Pt presents to the ed with gpd, from [facility], she has ben diagnosed with bipolar and has not been taking her meds.  She has been aggressive there and saying that people are chasing her. She states she does do marijuana, cocaine was in her system she states that "it must be all those pills she gets in jail" when asking the patient why she is here she says multiple disorganized statements and goes off on tangents. Denies any si or hi, denies hallucinations at this time but they were reported previously

## 2015-12-15 DIAGNOSIS — Z818 Family history of other mental and behavioral disorders: Secondary | ICD-10-CM | POA: Diagnosis not present

## 2015-12-15 DIAGNOSIS — Z888 Allergy status to other drugs, medicaments and biological substances status: Secondary | ICD-10-CM

## 2015-12-15 DIAGNOSIS — F3164 Bipolar disorder, current episode mixed, severe, with psychotic features: Secondary | ICD-10-CM | POA: Diagnosis not present

## 2015-12-15 DIAGNOSIS — Z811 Family history of alcohol abuse and dependence: Secondary | ICD-10-CM | POA: Diagnosis not present

## 2015-12-15 LAB — RAPID URINE DRUG SCREEN, HOSP PERFORMED
Amphetamines: NOT DETECTED
BENZODIAZEPINES: NOT DETECTED
Barbiturates: NOT DETECTED
Cocaine: POSITIVE — AB
Opiates: NOT DETECTED
Tetrahydrocannabinol: POSITIVE — AB

## 2015-12-15 LAB — COMPREHENSIVE METABOLIC PANEL
ALK PHOS: 54 U/L (ref 38–126)
ALT: 46 U/L (ref 14–54)
AST: 47 U/L — ABNORMAL HIGH (ref 15–41)
Albumin: 3.7 g/dL (ref 3.5–5.0)
Anion gap: 7 (ref 5–15)
BILIRUBIN TOTAL: 0.5 mg/dL (ref 0.3–1.2)
BUN: 5 mg/dL — AB (ref 6–20)
CALCIUM: 9.1 mg/dL (ref 8.9–10.3)
CO2: 22 mmol/L (ref 22–32)
Chloride: 111 mmol/L (ref 101–111)
Creatinine, Ser: 0.87 mg/dL (ref 0.44–1.00)
GFR calc Af Amer: >60 mL/min (ref >60)
Glucose, Bld: 97 mg/dL (ref 65–99)
POTASSIUM: 3.8 mmol/L (ref 3.5–5.1)
Sodium: 140 mmol/L (ref 135–145)
TOTAL PROTEIN: 6.2 g/dL — AB (ref 6.5–8.1)

## 2015-12-15 LAB — CBC WITH DIFFERENTIAL/PLATELET
Basophils Absolute: 0 10*3/uL (ref 0.0–0.1)
Basophils Relative: 0 %
Eosinophils Absolute: 0.3 10*3/uL (ref 0.0–0.7)
Eosinophils Relative: 5 %
HEMATOCRIT: 34.7 % — AB (ref 36.0–46.0)
Hemoglobin: 11.7 g/dL — ABNORMAL LOW (ref 12.0–15.0)
LYMPHS PCT: 37 %
Lymphs Abs: 2.4 10*3/uL (ref 0.7–4.0)
MCH: 32.3 pg (ref 26.0–34.0)
MCHC: 33.7 g/dL (ref 30.0–36.0)
MCV: 95.9 fL (ref 78.0–100.0)
MONO ABS: 1 10*3/uL (ref 0.1–1.0)
MONOS PCT: 15 %
NEUTROS ABS: 2.8 10*3/uL (ref 1.7–7.7)
Neutrophils Relative %: 43 %
Platelets: 222 10*3/uL (ref 150–400)
RBC: 3.62 MIL/uL — ABNORMAL LOW (ref 3.87–5.11)
RDW: 13.5 % (ref 11.5–15.5)
WBC: 6.5 10*3/uL (ref 4.0–10.5)

## 2015-12-15 LAB — ETHANOL

## 2015-12-15 MED ORDER — OLANZAPINE 10 MG IM SOLR
10.0000 mg | Freq: Once | INTRAMUSCULAR | Status: AC
  Administered 2015-12-15: 10 mg via INTRAMUSCULAR
  Filled 2015-12-15: qty 10

## 2015-12-15 MED ORDER — OLANZAPINE 5 MG PO TBDP
10.0000 mg | ORAL_TABLET | Freq: Every day | ORAL | Status: DC
  Administered 2015-12-15 – 2015-12-16 (×2): 10 mg via ORAL
  Filled 2015-12-15 (×2): qty 2

## 2015-12-15 NOTE — ED Notes (Signed)
Pt wakes up for brief moments. Spoke w/pt as per request. States she wants to leave. Advised pt it is not time to do so. Pt yelled "I'm ready to go!" then returned to sleeping.

## 2015-12-15 NOTE — ED Notes (Signed)
Pt given shower supplies so may shower as requested. Pt ambulatory to desk stating she wants her flip flops so may shower. Advised she may place an extra towel on floor and that shower has been cleaned d/t pt stating "I don't want to get no fungus. I already have calluses." Pt talking in loud, irritated toned voice that "I am going to get my stuff and y'all better not have broke my cell phone" - as she is ambulating to shower. Security standing by.

## 2015-12-15 NOTE — ED Notes (Signed)
Patient now in purple scrubs - attempting to give her meds

## 2015-12-15 NOTE — ED Notes (Signed)
Pt ambulatory in hallway to bathroom.
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
## 2015-12-15 NOTE — Consult Note (Signed)
Telepsych Consultation   Reason for Consult:  Psychotic behavior with aggression Referring Physician:  EDP Patient Identification: [name] [name] MRN:  [id number] Principal Diagnosis: Bipolar disorder, curr episode mixed, severe, with psychotic features [facility]) Diagnosis:   Patient Active Problem List   Diagnosis Date Noted  . Bipolar disorder, curr episode mixed, severe, with psychotic features ([place]) [F31.64] 08/02/2014    Priority: High  . Bipolar disorder ([place]) [F31.9] 11/12/2015  . Cannabis use disorder, severe, dependence ([place]) [F12.20] 08/02/2014  . Recurrent UTI [N39.0] 05/15/2013  . PCOS (polycystic ovarian syndrome) [E28.2] 02/26/2012  . Hypomenorrhea/oligomenorrhea [N91.5] 02/26/2012    Total Time spent with patient: 30 minutes  Subjective:   [name] [name] is a 30 y.o. female patient admitted with severe psychosis and aggression. She states " I just want to be left alone and why can't they just leave me alone?"  She also stated that she did not understand why everyone wants her to be a super star. Pt states "They keep telling me that they going to detained me here because they think I'm some type of witch because I haven't went to sleep. I went to the jail to get my truck which I paid 38,000 for but they brought here.  Why am I here on bleach stained sheets?" Pt denies suicidal and homicidal ideation. Pt denies hallucinations.   HPI:   During telepsych assessment this morning Pt denied suicidal and homicidal ideations and denied auditory or visual hallucinations. She did not appear to be responding to internal stimuli. Pt was dressed in paper scrubs and appeared disheveled. Her speech was rapid and pressured with flight of ideas. When asked if she was taking her meds she stated she was taking everything they were giving her and she was doing everything they were asking her to do. Pt is pacing and moving the camera around, putting her face very close to the camera and  asking this writer if I can see her. Pt has had multiple admissions for psychosis in the past and at present is non-compliant with her psych medications and is abusing street drugs, UDS +cocaine. Pt denies her non-compliance with psych medications and is insistent that she is capable of returning home and caring for herself. Pt stated she needs to go get her truck that she paid 38,000 for and that she tried to turn herself in at the jail for a warrant for not walking on the sidewalk.    Past Psychiatric History: BiPolar Disorder severe, recurrent, mixed features, Polysubstance abuse, schizophrenia  Risk to Self: Suicidal Ideation: No Suicidal Intent: No Is patient at risk for suicide?: No Suicidal Plan?: No Access to Means: No What has been your use of drugs/alcohol within the last 12 months?: pt UDS +THC, per chart pt admits to cocaine use How many times?:  (UTA) Other Self Harm Risks: non-compliant with medications per triage Triggers for Past Attempts: Unknown Intentional Self Injurious Behavior:  (UTA) Risk to Others:  Prior Inpatient Therapy: Prior Inpatient Therapy: Yes (Per Chart) Prior Therapy Dates: Unk Reason for Treatment: hx of schizophrenia (Per Chart) Prior Outpatient Therapy: Prior Outpatient Therapy: Yes Prior Therapy Dates: ongoing Prior Therapy Facilty/Provider(s): [facility] Reason for Treatment: h/o PTSD & Schizophrenia per chart Does patient have an ACCT team?: Unknown Does patient have Intensive In-House Services?  : Unknown Does patient have [facility] services? : Yes Does patient have P4CC services?: No  Past Medical History:  Past Medical History:  Diagnosis Date  . Anxiety   . Bipolar  disorder ([place])   . Boils   . Headache(784.0)   . Mental disorder   . Polycystic ovarian syndrome   . Tumor of ovary     Past Surgical History:  Procedure Laterality Date  . TUMOR REMOVAL     Family History:  Family History  Problem Relation Age of Onset  . Mental  illness Mother   . Thyroid disease Mother   . Alcohol abuse Brother    Family Psychiatric  History: Unknown Social History:  History  Alcohol Use No    Comment: no alcohol for over a year     History  Drug Use  . Types: Marijuana, Cocaine    Comment: smokes marijuana every day and no cocaine for almost a yearLAST  USE OF COCAINE MARCH,  LAST  MARIJUANA-  YESTERDAY     Social History   Social History  . Marital status: Divorced    Spouse name: N/A  . Number of children: N/A  . Years of education: N/A   Social History Main Topics  . Smoking status: Former Smoker    Packs/day: 0.25    Types: Cigarettes  . Smokeless tobacco: Never Used  . Alcohol use No     Comment: no alcohol for over a year  . Drug use:     Types: Marijuana, Cocaine     Comment: smokes marijuana every day and no cocaine for almost a yearLAST  USE OF COCAINE MARCH,  LAST  MARIJUANA-  YESTERDAY   . Sexual activity: Yes    Birth control/ protection: None   Other Topics Concern  . None   Social History Narrative  . None   Additional Social History:    Allergies:   Allergies  Allergen Reactions  . Ibuprofen Nausea And Vomiting    Stomach upset    Labs:  Results for orders placed or performed during the hospital encounter of 12/14/15 (from the past 48 hour(s))  CBC with Differential/Platelet     Status: Abnormal   Collection Time: 12/15/15 12:21 AM  Result Value Ref Range   WBC 6.5 4.0 - 10.5 K/uL   RBC 3.62 (L) 3.87 - 5.11 MIL/uL   Hemoglobin 11.7 (L) 12.0 - 15.0 g/dL   HCT 34.7 (L) 36.0 - 46.0 %   MCV 95.9 78.0 - 100.0 fL   MCH 32.3 26.0 - 34.0 pg   MCHC 33.7 30.0 - 36.0 g/dL   RDW 13.5 11.5 - 15.5 %   Platelets 222 150 - 400 K/uL   Neutrophils Relative % 43 %   Neutro Abs 2.8 1.7 - 7.7 K/uL   Lymphocytes Relative 37 %   Lymphs Abs 2.4 0.7 - 4.0 K/uL   Monocytes Relative 15 %   Monocytes Absolute 1.0 0.1 - 1.0 K/uL   Eosinophils Relative 5 %   Eosinophils Absolute 0.3 0.0 - 0.7 K/uL    Basophils Relative 0 %   Basophils Absolute 0.0 0.0 - 0.1 K/uL  Comprehensive metabolic panel     Status: Abnormal   Collection Time: 12/15/15 12:21 AM  Result Value Ref Range   Sodium 140 135 - 145 mmol/L   Potassium 3.8 3.5 - 5.1 mmol/L   Chloride 111 101 - 111 mmol/L   CO2 22 22 - 32 mmol/L   Glucose, Bld 97 65 - 99 mg/dL   BUN 5 (L) 6 - 20 mg/dL   Creatinine, Ser 0.87 0.44 - 1.00 mg/dL   Calcium 9.1 8.9 - 10.3 mg/dL   Total Protein 6.2 (  L) 6.5 - 8.1 g/dL   Albumin 3.7 3.5 - 5.0 g/dL   AST 47 (H) 15 - 41 U/L   ALT 46 14 - 54 U/L   Alkaline Phosphatase 54 38 - 126 U/L   Total Bilirubin 0.5 0.3 - 1.2 mg/dL   GFR calc non Af Amer >60 >60 mL/min   GFR calc Af Amer >60 >60 mL/min    Comment: (NOTE) The eGFR has been calculated using the CKD EPI equation. This calculation has not been validated in all clinical situations. eGFR's persistently <60 mL/min signify possible Chronic Kidney Disease.    Anion gap 7 5 - 15  Ethanol     Status: None   Collection Time: 12/15/15 12:21 AM  Result Value Ref Range   Alcohol, Ethyl (B) <5 <5 mg/dL    Comment:        LOWEST DETECTABLE LIMIT FOR SERUM ALCOHOL IS 5 mg/dL FOR MEDICAL PURPOSES ONLY   Rapid urine drug screen (hospital performed)     Status: Abnormal   Collection Time: 12/15/15  2:05 AM  Result Value Ref Range   Opiates NONE DETECTED NONE DETECTED   Cocaine POSITIVE (A) NONE DETECTED   Benzodiazepines NONE DETECTED NONE DETECTED   Amphetamines NONE DETECTED NONE DETECTED   Tetrahydrocannabinol POSITIVE (A) NONE DETECTED   Barbiturates NONE DETECTED NONE DETECTED    Comment:        DRUG SCREEN FOR MEDICAL PURPOSES ONLY.  IF CONFIRMATION IS NEEDED FOR ANY PURPOSE, NOTIFY LAB WITHIN 5 DAYS.        LOWEST DETECTABLE LIMITS FOR URINE DRUG SCREEN Drug Class       Cutoff (ng/mL) Amphetamine      1000 Barbiturate      200 Benzodiazepine   277 Tricyclics       824 Opiates          300 Cocaine          300 THC               50     Current Facility-Administered Medications  Medication Dose Route Frequency Provider Last Rate Last Dose  . acetaminophen (TYLENOL) tablet 650 mg  650 mg Oral Q4H PRN Domenic Moras, PA-C      . alum & mag hydroxide-simeth (MAALOX/MYLANTA) 200-200-20 MG/5ML suspension 30 mL  30 mL Oral PRN Domenic Moras, PA-C      . escitalopram (LEXAPRO) tablet 10 mg  10 mg Oral Daily Domenic Moras, PA-C      . lamoTRIgine (LAMICTAL) tablet 25 mg  25 mg Oral Daily Domenic Moras, PA-C      . LORazepam (ATIVAN) tablet 1 mg  1 mg Oral Q8H PRN Domenic Moras, PA-C      . metFORMIN (GLUCOPHAGE) tablet 500 mg  500 mg Oral BID WC Domenic Moras, PA-C      . nicotine (NICODERM CQ - dosed in mg/24 hours) patch 21 mg  21 mg Transdermal Daily Domenic Moras, PA-C      . OLANZapine (ZYPREXA) injection 10 mg  10 mg Intramuscular Once Quintella Reichert, MD      . OLANZapine zydis (ZYPREXA) disintegrating tablet 10 mg  10 mg Oral QHS Ethelene Hal, NP      . ondansetron [facility]) tablet 4 mg  4 mg Oral Q8H PRN Domenic Moras, PA-C      . risperiDONE (RISPERDAL) tablet 2 mg  2 mg Oral Daily Domenic Moras, PA-C      . zolpidem (AMBIEN) tablet 5 mg  5 mg Oral QHS PRN Domenic Moras, PA-C       Current Outpatient Prescriptions  Medication Sig Dispense Refill  . acetaminophen (TYLENOL) 500 MG tablet Take 500 mg by mouth every 6 (six) hours as needed for moderate pain or headache. Reported on 07/02/2015    . benztropine (COGENTIN) 0.5 MG tablet Take 1 tablet (0.5 mg total) by mouth at bedtime. 30 tablet 0  . escitalopram (LEXAPRO) 10 MG tablet Take 10 mg by mouth daily.    Marland Kitchen guaiFENesin (MUCINEX) 600 MG 12 hr tablet Take 2 tablets (1,200 mg total) by mouth 2 (two) times daily. (Patient not taking: Reported on 07/02/2015) 20 tablet 0  . lamoTRIgine (LAMICTAL) 25 MG tablet Take 1 tablet (25 mg total) by mouth daily. For mood stability 30 tablet 0  . medroxyPROGESTERone (PROVERA) 10 MG tablet Take 1 tablet (10 mg total) by mouth daily. Use for ten days  (Patient not taking: Reported on 11/11/2015) 10 tablet 0  . metFORMIN (GLUCOPHAGE) 500 MG tablet Take 1 tablet (500 mg total) by mouth 2 (two) times daily with a meal. 60 tablet 5  . OLANZapine (ZYPREXA) 5 MG tablet Take 1 tablet (5 mg total) by mouth at bedtime. 30 tablet 0  . PRENATAL 27-1 MG TABS Take 1 tablet by mouth daily. 30 each 0  . traZODone (DESYREL) 100 MG tablet Take 1 tablet (100 mg total) by mouth at bedtime as needed for sleep. 30 tablet 0    Musculoskeletal: Unable to assess, camera  Psychiatric Specialty Exam: Physical Exam  Review of Systems  Psychiatric/Behavioral: Positive for hallucinations and suicidal ideas. The patient is nervous/anxious.   All other systems reviewed and are negative.   Blood pressure 117/63, pulse 61, temperature 97.6 F (36.4 C), temperature source Oral, resp. rate 18, SpO2 100 %.There is no height or weight on file to calculate BMI.  General Appearance: Disheveled and Fairly Groomed  Eye Contact:  Fair  Speech:  Pressured  Volume:  Increased  Mood:  Angry, Anxious and Irritable  Affect:  Congruent  Thought Process:  Disorganized  Orientation:  Full (Time, Place, and Person)  Thought Content:  Illogical and Delusions believes people are not letting her go home and she is capable of cleaning her own house  Suicidal Thoughts:  No  Homicidal Thoughts:  No  Memory:  Immediate;   Fair Recent;   Fair Remote;   Fair  Judgement:  Poor  Insight:  Lacking  Psychomotor Activity:  Increased and Restlessness  Concentration:  Concentration: Poor  Recall:  Poor  Fund of Knowledge:  Poor  Language:  Good  Akathisia:  No  Handed:  Right  AIMS (if indicated):     Assets:  Agricultural consultant Resilience Social Support  ADL's:  Intact  Cognition:  WNL  Sleep:        Treatment Plan Summary: Will seek inpatient psychiatric placement.   Increase  Zyprexa to 10 mg PO QHS.   If Pt continues to refuse PO  medications please consider a second opinion for forced medications due to refusal of PO medicationss and active psychosis.   EDP may give emergent meds.   Request for psychiatric consult for second opinion regarding forced meds placed in consult book.    Disposition: Recommend psychiatric Inpatient admission when medically cleared.   Ethelene Hal, NP 12/15/2015 12:41 PM

## 2015-12-15 NOTE — ED Notes (Signed)
Flip flops removed from pt's room - placed at nurses' desk to be placed w/pt's other belongings.
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
## 2015-12-15 NOTE — ED Notes (Signed)
Dr Venora Maples given 2nd exam paperwork to complete d/t pt IVC'd from [facility].

## 2015-12-15 NOTE — ED Notes (Signed)
Pt slept through the transfer to new room, however woke up disorientated.  She was very upset, verbally agitated.  Pt refused Xyprexa, fell back to sleep quickly.

## 2015-12-15 NOTE — ED Notes (Signed)
Pt asked for phone and book of poems.  RN explained that she could not have a phone but rules did permit her to have a book.  RN retrieved book from property.  Pt was asleep when RN returned w/ the book, left on table.

## 2015-12-15 NOTE — ED Notes (Signed)
Pt standing in doorway of exam room. Pt continues to refuse meds.

## 2015-12-15 NOTE — ED Notes (Signed)
Pt noted to be tearful, talkative. Continues to refuse meds. States "I will take them on my own terms!"

## 2015-12-15 NOTE — Progress Notes (Signed)
Disposition CSW completed patient referrals to the following inpatient psych facilities:   [facility]  CSW will continue to follow patient for placement needs.  [facility] Disposition CSW [phone]

## 2015-12-15 NOTE — ED Notes (Addendum)
Pt ambulatory to nurses' desk - asking for [name] [name] [id number] to her contact list. Pt remains manic w/repetitive speech and loose associations. Pt continues to state she wants to leave and does not understand why she can't. States "I'm about to lose my apartment and nobody cares." Was stating earlier she wanted to leave d/t wanted to go to church tomorrow. States she does not want anymore injections or pills d/t does not need them and has a difficult time trying to think.

## 2015-12-15 NOTE — ED Notes (Signed)
Pt approached nurses desk asking for graham crackers. Pt reminded of the snack time hours and reminded she just had a snack. She states she threw away the applesauce and now wants graham crackers and again was informed she can have snack at the next scheduled snack time. Pt stated she was getting angry but walked back to her room with steady gait.

## 2015-12-15 NOTE — ED Notes (Signed)
Pt woke - asking for flip flops. Advised pt she may have socks - socks given. Pt became upset - stating she was given the flip flops while at [facility] "so I am allowed to have them". Advised pt of [facility] policy and she may wear socks. Pt put on socks, noted to be cursing, then ambulated to desk - repeating herself. Pt then stated she was going to back to her room to sleep. Pt had refused Glucophage. Stating she only took it to help her become pregnant.

## 2015-12-15 NOTE — ED Notes (Signed)
Pt on phone at nurses' desk. 

## 2015-12-15 NOTE — ED Notes (Signed)
RN spoke w/pt per pt's request. Pt eating lunch. Pt remains manic - pt noted w/loose associations, delusions of grandeur speech. Pt noted to be tearful at times. RN allowed pt to vent feelings/concerns.

## 2015-12-15 NOTE — ED Notes (Signed)
Pt on phone w/her aunt Jeannene Patella. Pt had signed release of info form to speak w/her - copy on clipboard and copy sent to Medical Records.

## 2015-12-15 NOTE — ED Notes (Signed)
TTS being performed.  

## 2015-12-15 NOTE — ED Notes (Signed)
Per Doree Albee, [facility] - has requested for psychiatrist to assess pt tomorrow for "forced meds".

## 2015-12-15 NOTE — ED Notes (Signed)
Telepsych in progress. 

## 2015-12-15 NOTE — ED Notes (Signed)
Lying on bed w/eyes closed. Respirations even, unlabored.  

## 2015-12-16 DIAGNOSIS — Z79899 Other long term (current) drug therapy: Secondary | ICD-10-CM

## 2015-12-16 DIAGNOSIS — F3164 Bipolar disorder, current episode mixed, severe, with psychotic features: Secondary | ICD-10-CM | POA: Diagnosis not present

## 2015-12-16 DIAGNOSIS — Z87891 Personal history of nicotine dependence: Secondary | ICD-10-CM

## 2015-12-16 DIAGNOSIS — Z811 Family history of alcohol abuse and dependence: Secondary | ICD-10-CM

## 2015-12-16 DIAGNOSIS — Z8489 Family history of other specified conditions: Secondary | ICD-10-CM

## 2015-12-16 MED ORDER — STERILE WATER FOR INJECTION IJ SOLN
INTRAMUSCULAR | Status: AC
  Filled 2015-12-16: qty 10

## 2015-12-16 MED ORDER — ZIPRASIDONE MESYLATE 20 MG IM SOLR
20.0000 mg | Freq: Once | INTRAMUSCULAR | Status: DC
  Filled 2015-12-16: qty 20

## 2015-12-16 MED ORDER — ZIPRASIDONE MESYLATE 20 MG IM SOLR
20.0000 mg | Freq: Once | INTRAMUSCULAR | Status: AC
  Administered 2015-12-16: 20 mg via INTRAMUSCULAR
  Filled 2015-12-16: qty 20

## 2015-12-16 MED ORDER — OLANZAPINE 5 MG PO TABS
5.0000 mg | ORAL_TABLET | Freq: Every day | ORAL | Status: DC
  Administered 2015-12-16 – 2015-12-17 (×2): 5 mg via ORAL
  Filled 2015-12-16 (×2): qty 1

## 2015-12-16 NOTE — ED Notes (Signed)
Pt standing at door way taking in random words and cursing; [employer] requested to sit in POD; [employer] present at this time and speaking with pt

## 2015-12-16 NOTE — ED Notes (Signed)
Encouraged pt to take 1000 meds - refused. States she does not need them and that she has had 2 doses of Zyprexa.

## 2015-12-16 NOTE — ED Notes (Signed)
Per Arby Barrette, [facility] Counselor, pt requesting to speak w/SW re: housing. States lives in an apartment currently.

## 2015-12-16 NOTE — Care Management Note (Signed)
Case Management Note  Patient Details  Name: Vanely Sharber MRN: [id number] Date of Birth: [birth year]  Subjective/Objective:  30 y.o. F from [place]. CSW consult has been placed for Housing issues.                   Action/Plan: Texted CSW to ensure awareness of consult.    Expected Discharge Date:                  Expected Discharge Plan:     In-House Referral:  Clinical Social Work  Discharge planning Services  CM Consult  Post Acute Care Choice:    Choice offered to:     DME Arranged:    DME Agency:     HH Arranged:    HH Agency:     Status of Service:  In process, will continue to follow  If discussed at Long Length of Stay Meetings, dates discussed:    Additional Comments:  Delrae Sawyers, RN 12/16/2015, 11:14 AM

## 2015-12-16 NOTE — ED Notes (Signed)
Pt asked for socks - blue given. Pt then stated "Now I want brown socks" - given. Pt returned to room.

## 2015-12-16 NOTE — ED Notes (Signed)
Pt standing in the hallway yelling at sitters and staff; Pt request a new sitter; Md called and order given for med; Charge notified; [employer] and security request for med administration support

## 2015-12-16 NOTE — ED Notes (Signed)
Pt made phone call earlier this am. Voiced understanding has 1 phone call left for the day.

## 2015-12-16 NOTE — ED Notes (Signed)
Pt has been ambulating around nurses' desk.

## 2015-12-16 NOTE — Consult Note (Signed)
[facility] Face-to-face Consultation   Reason for Consult:  Psychotic behavior with aggression Referring Physician:  EDP Patient Identification: [name] [name] MRN:  [id number] Principal Diagnosis: Bipolar disorder, curr episode mixed, severe, with psychotic features [facility]) Diagnosis:   Patient Active Problem List   Diagnosis Date Noted  . Bipolar disorder ([place]) [F31.9] 11/12/2015  . Bipolar disorder, curr episode mixed, severe, with psychotic features ([place]) [F31.64] 08/02/2014  . Cannabis use disorder, severe, dependence ([place]) [F12.20] 08/02/2014  . Recurrent UTI [N39.0] 05/15/2013  . PCOS (polycystic ovarian syndrome) [E28.2] 02/26/2012  . Hypomenorrhea/oligomenorrhea [N91.5] 02/26/2012   Total Time spent with patient: 30 minutes   Subjective:   [name] [name] is a 30 y.o. female patient admitted with severe psychosis and aggression. Pt was seen yesterday via telepsych consult. Pt seen and chart reviewed. Pt is alert/oriented x4, calm, cooperative, and appropriate to situation. Pt denies suicidal/homicidal ideation and psychosis and does not appear to be responding to internal stimuli. However, pt does continue to present with flight of ideas, tangential though process, and mildly pressured speech. Pt reports that she lives alone and has a court date tomorrow for which she would like to discharge so there is no warrant issued for her arrest. See recommendations below.   HPI:   During telepsych assessment this morning Pt denied suicidal and homicidal ideations and denied auditory or visual hallucinations. She did not appear to be responding to internal stimuli. Pt was dressed in paper scrubs and appeared disheveled. Her speech was rapid and pressured with flight of ideas. When asked if she was taking her meds she stated she was taking everything they were giving her and she was doing everything they were asking her to do. Pt is pacing and moving the camera around, putting her face very  close to the camera and asking this writer if I can see her. Pt has had multiple admissions for psychosis in the past and at present is non-compliant with her psych medications and is abusing street drugs, UDS +cocaine. Pt denies her non-compliance with psych medications and is insistent that she is capable of returning home and caring for herself. Pt stated she needs to go get her truck that she paid 38,000 for and that she tried to turn herself in at the jail for a warrant for not walking on the sidewalk.   Pt was agitated in the ED requiring emergent psych meds last night although has been doing better today and was seen above by psychiatry team today on 12/16/15.   Past Psychiatric History: BiPolar Disorder severe, recurrent, mixed features, Polysubstance abuse, schizophrenia  Risk to Self: Suicidal Ideation: No Suicidal Intent: No Is patient at risk for suicide?: No Suicidal Plan?: No Access to Means: No What has been your use of drugs/alcohol within the last 12 months?: pt UDS +THC, per chart pt admits to cocaine use How many times?:  (UTA) Other Self Harm Risks: non-compliant with medications per triage Triggers for Past Attempts: Unknown Intentional Self Injurious Behavior:  (UTA) Risk to Others:  Prior Inpatient Therapy: Prior Inpatient Therapy: Yes (Per Chart) Prior Therapy Dates: Unk Reason for Treatment: hx of schizophrenia (Per Chart) Prior Outpatient Therapy: Prior Outpatient Therapy: Yes Prior Therapy Dates: ongoing Prior Therapy Facilty/Provider(s): [facility] Reason for Treatment: h/o PTSD & Schizophrenia per chart Does patient have an ACCT team?: Unknown Does patient have Intensive In-House Services?  : Unknown Does patient have [facility] services? : Yes Does patient have P4CC services?: No  Past Medical History:  Past  Medical History:  Diagnosis Date  . Anxiety   . Bipolar disorder ([place])   . Boils   . Headache(784.0)   . Mental disorder   . Polycystic ovarian  syndrome   . Tumor of ovary     Past Surgical History:  Procedure Laterality Date  . TUMOR REMOVAL     Family History:  Family History  Problem Relation Age of Onset  . Mental illness Mother   . Thyroid disease Mother   . Alcohol abuse Brother    Family Psychiatric  History: Unknown Social History:  History  Alcohol Use No    Comment: no alcohol for over a year     History  Drug Use  . Types: Marijuana, Cocaine    Comment: smokes marijuana every day and no cocaine for almost a yearLAST  USE OF COCAINE MARCH,  LAST  MARIJUANA-  YESTERDAY     Social History   Social History  . Marital status: Divorced    Spouse name: N/A  . Number of children: N/A  . Years of education: N/A   Social History Main Topics  . Smoking status: Former Smoker    Packs/day: 0.25    Types: Cigarettes  . Smokeless tobacco: Never Used  . Alcohol use No     Comment: no alcohol for over a year  . Drug use:     Types: Marijuana, Cocaine     Comment: smokes marijuana every day and no cocaine for almost a yearLAST  USE OF COCAINE MARCH,  LAST  MARIJUANA-  YESTERDAY   . Sexual activity: Yes    Birth control/ protection: None   Other Topics Concern  . None   Social History Narrative  . None   Additional Social History:    Allergies:   Allergies  Allergen Reactions  . Ibuprofen Nausea And Vomiting    Stomach upset    Labs:  Results for orders placed or performed during the hospital encounter of 12/14/15 (from the past 48 hour(s))  CBC with Differential/Platelet     Status: Abnormal   Collection Time: 12/15/15 12:21 AM  Result Value Ref Range   WBC 6.5 4.0 - 10.5 K/uL   RBC 3.62 (L) 3.87 - 5.11 MIL/uL   Hemoglobin 11.7 (L) 12.0 - 15.0 g/dL   HCT 34.7 (L) 36.0 - 46.0 %   MCV 95.9 78.0 - 100.0 fL   MCH 32.3 26.0 - 34.0 pg   MCHC 33.7 30.0 - 36.0 g/dL   RDW 13.5 11.5 - 15.5 %   Platelets 222 150 - 400 K/uL   Neutrophils Relative % 43 %   Neutro Abs 2.8 1.7 - 7.7 K/uL   Lymphocytes  Relative 37 %   Lymphs Abs 2.4 0.7 - 4.0 K/uL   Monocytes Relative 15 %   Monocytes Absolute 1.0 0.1 - 1.0 K/uL   Eosinophils Relative 5 %   Eosinophils Absolute 0.3 0.0 - 0.7 K/uL   Basophils Relative 0 %   Basophils Absolute 0.0 0.0 - 0.1 K/uL  Comprehensive metabolic panel     Status: Abnormal   Collection Time: 12/15/15 12:21 AM  Result Value Ref Range   Sodium 140 135 - 145 mmol/L   Potassium 3.8 3.5 - 5.1 mmol/L   Chloride 111 101 - 111 mmol/L   CO2 22 22 - 32 mmol/L   Glucose, Bld 97 65 - 99 mg/dL   BUN 5 (L) 6 - 20 mg/dL   Creatinine, Ser 0.87 0.44 - 1.00 mg/dL  Calcium 9.1 8.9 - 10.3 mg/dL   Total Protein 6.2 (L) 6.5 - 8.1 g/dL   Albumin 3.7 3.5 - 5.0 g/dL   AST 47 (H) 15 - 41 U/L   ALT 46 14 - 54 U/L   Alkaline Phosphatase 54 38 - 126 U/L   Total Bilirubin 0.5 0.3 - 1.2 mg/dL   GFR calc non Af Amer >60 >60 mL/min   GFR calc Af Amer >60 >60 mL/min    Comment: (NOTE) The eGFR has been calculated using the CKD EPI equation. This calculation has not been validated in all clinical situations. eGFR's persistently <60 mL/min signify possible Chronic Kidney Disease.    Anion gap 7 5 - 15  Ethanol     Status: None   Collection Time: 12/15/15 12:21 AM  Result Value Ref Range   Alcohol, Ethyl (B) <5 <5 mg/dL    Comment:        LOWEST DETECTABLE LIMIT FOR SERUM ALCOHOL IS 5 mg/dL FOR MEDICAL PURPOSES ONLY   Rapid urine drug screen (hospital performed)     Status: Abnormal   Collection Time: 12/15/15  2:05 AM  Result Value Ref Range   Opiates NONE DETECTED NONE DETECTED   Cocaine POSITIVE (A) NONE DETECTED   Benzodiazepines NONE DETECTED NONE DETECTED   Amphetamines NONE DETECTED NONE DETECTED   Tetrahydrocannabinol POSITIVE (A) NONE DETECTED   Barbiturates NONE DETECTED NONE DETECTED    Comment:        DRUG SCREEN FOR MEDICAL PURPOSES ONLY.  IF CONFIRMATION IS NEEDED FOR ANY PURPOSE, NOTIFY LAB WITHIN 5 DAYS.        LOWEST DETECTABLE LIMITS FOR URINE DRUG  SCREEN Drug Class       Cutoff (ng/mL) Amphetamine      1000 Barbiturate      200 Benzodiazepine   656 Tricyclics       812 Opiates          300 Cocaine          300 THC              50     Current Facility-Administered Medications  Medication Dose Route Frequency Provider Last Rate Last Dose  . acetaminophen (TYLENOL) tablet 650 mg  650 mg Oral Q4H PRN Domenic Moras, PA-C      . alum & mag hydroxide-simeth (MAALOX/MYLANTA) 200-200-20 MG/5ML suspension 30 mL  30 mL Oral PRN Domenic Moras, PA-C      . escitalopram (LEXAPRO) tablet 10 mg  10 mg Oral Daily Domenic Moras, PA-C      . lamoTRIgine (LAMICTAL) tablet 25 mg  25 mg Oral Daily Domenic Moras, PA-C      . LORazepam (ATIVAN) tablet 1 mg  1 mg Oral Q8H PRN Domenic Moras, PA-C      . metFORMIN (GLUCOPHAGE) tablet 500 mg  500 mg Oral BID WC Domenic Moras, PA-C      . nicotine (NICODERM CQ - dosed in mg/24 hours) patch 21 mg  21 mg Transdermal Daily Domenic Moras, PA-C      . OLANZapine zydis (ZYPREXA) disintegrating tablet 10 mg  10 mg Oral QHS Ethelene Hal, NP   10 mg at 12/15/15 2158  . ondansetron (ZOFRAN) tablet 4 mg  4 mg Oral Q8H PRN Domenic Moras, PA-C      . risperiDONE (RISPERDAL) tablet 2 mg  2 mg Oral Daily Domenic Moras, PA-C      . ziprasidone (GEODON) injection 20 mg  20 mg Intramuscular Once  Veryl Speak, MD      . zolpidem [facility]) tablet 5 mg  5 mg Oral QHS PRN Domenic Moras, PA-C       Current Outpatient Prescriptions  Medication Sig Dispense Refill  . benztropine (COGENTIN) 0.5 MG tablet Take 1 tablet (0.5 mg total) by mouth at bedtime. (Patient taking differently: Take 0.5 mg by mouth daily. ) 30 tablet 0  . lamoTRIgine (LAMICTAL) 25 MG tablet Take 1 tablet (25 mg total) by mouth daily. For mood stability 30 tablet 0  . OLANZapine (ZYPREXA) 10 MG tablet Take 10 mg by mouth at bedtime.    Marland Kitchen acetaminophen (TYLENOL) 500 MG tablet Take 500 mg by mouth every 6 (six) hours as needed for moderate pain or headache. Reported on 07/02/2015    .  escitalopram (LEXAPRO) 10 MG tablet Take 10 mg by mouth daily.    Marland Kitchen guaiFENesin (MUCINEX) 600 MG 12 hr tablet Take 2 tablets (1,200 mg total) by mouth 2 (two) times daily. (Patient not taking: Reported on 07/02/2015) 20 tablet 0  . medroxyPROGESTERone (PROVERA) 10 MG tablet Take 1 tablet (10 mg total) by mouth daily. Use for ten days (Patient not taking: Reported on 11/11/2015) 10 tablet 0  . metFORMIN (GLUCOPHAGE) 500 MG tablet Take 1 tablet (500 mg total) by mouth 2 (two) times daily with a meal. 60 tablet 5  . OLANZapine (ZYPREXA) 5 MG tablet Take 1 tablet (5 mg total) by mouth at bedtime. 30 tablet 0  . PRENATAL 27-1 MG TABS Take 1 tablet by mouth daily. 30 each 0  . traZODone (DESYREL) 100 MG tablet Take 1 tablet (100 mg total) by mouth at bedtime as needed for sleep. 30 tablet 0    Musculoskeletal: Unable to assess, camera  Psychiatric Specialty Exam: Physical Exam  Review of Systems  Psychiatric/Behavioral: Positive for hallucinations and suicidal ideas. The patient is nervous/anxious.   All other systems reviewed and are negative.   Blood pressure 125/82, pulse 67, temperature 98.3 F (36.8 C), temperature source Oral, resp. rate 18, SpO2 100 %.There is no height or weight on file to calculate BMI.  General Appearance: Casual and Fairly Groomed  Eye Contact:  Fair  Speech:  Clear, coherent, mildly pressured  Volume:  Increased  Mood:  Anxious  Affect:  Congruent  Thought Process:  Tangential although improving  Orientation:  Full (Time, Place, and Person)  Thought Content:  Discharge plans, court date tomorrow   Suicidal Thoughts:  No  Homicidal Thoughts:  No  Memory:  Immediate;   Fair Recent;   Fair Remote;   Fair  Judgement:  Poor  Insight:  Lacking  Psychomotor Activity:  Increased and Restlessness  Concentration:  Concentration: Fair and Attention Span: Fair  Recall:  Poor  Fund of Knowledge:  Poor  Language:  Good  Akathisia:  No  Handed:  Right  AIMS (if  indicated):     Assets:  Agricultural consultant Resilience Social Support  ADL's:  Intact  Cognition:  WNL  Sleep:      Treatment Plan Summary: Bipolar disorder, curr episode mixed, severe, with psychotic features ([place]) improving, and may be able to discharge tomorrow if she continues to improve. Please make the following med changes and we can re-assess tomorrow to determine improvement. If pt declines, we will seek inpatient although we are optimistic about possible DC tomorrow.  At this time, there is no criteria for forced meds.   Medications: -Continue Zyprexa 31m po qhs -Add Zyprexa 565mpo  qam (add 1x dose now to start) -May give PRN ativan PO ONLY (do not give IM with this combo) for agitation -Discontinue Risperidone  -Continue Lexapro 49m po daily for MDD -Continue Lamictal 216mpo daily for mood stabilization -Discontinue Ambien -Restart home Trazodone 5066mo qhs prn insomnia   Disposition: Recommend psychiatric Inpatient admission when medically cleared.   WitBenjamine MolaNP[place]/15/2017 12:25 PM   Patient case reviewed and patient seen with NP in rounds, agree with note and assessment

## 2015-12-16 NOTE — ED Notes (Signed)
Pt has been at RN station yelling and asking for her Bishop to be able to come back to see her; Pt was given visiting hours; RN spoke with family member, Theadore Nan who requested we allow Bishop back to see pt; family member given next available visiting hours; Pt got upset with RN calling RN "[facility]; RN explained to pt why we will administer Geodon because she did not keep up with her end for the deal to be calm and remain in her room; Pt request another RN to administer IM med; Matt,RN administered Geodon for [facility] RN; Pt was corporative with IM shot; Pt now calming sitting in room coloring; Security remains in POD for added support; Sitter remains at bedside

## 2015-12-16 NOTE — ED Notes (Signed)
Pt request her Zyprexa; Charge RN spoke with pt; pt understands will try Zyprexa first but if she continues to act out she will receive IM Geodon; [employer] and security present for med administration; Pt currently sitting in room eating dinner and watching TV. Pt  Verbally threaten RN and staff  if IM Geodon given; need for Geodon will be reassessed.

## 2015-12-16 NOTE — ED Notes (Signed)
Pt has returned to exam room w/sitter.

## 2015-12-16 NOTE — ED Notes (Signed)
Pt ambulating around nurses' desk w/sitter.

## 2015-12-16 NOTE — ED Notes (Signed)
Dr Parke Poisson and Heloise Purpura, DNP in w/pt.

## 2015-12-16 NOTE — ED Notes (Signed)
Pt ambulating around nurses' desk - stops intermittently to speak w/RN - pt speaking in language unable to discern. Pt laughing loudly inappropriately. Pt walking at a fast pace at times. Encouraged pt to only ambulate around nurses' desk when pt had attempted to ambulate around to shower area. Pt voiced understanding and is able to follow directions at this time.

## 2015-12-16 NOTE — ED Notes (Addendum)
Monique RN and this RN preparing to administer IM Geodon injection. While preparing medication patient requested to take her PO Zyprexa and threatening violence if IM medications are given. Spoke personally with patient and advised her that we will give her PO medication at this time vs IM, but she must remain in control and calm; additionally I advised patient that she must not disturb neighboring patients and keep volume of voice low. Additionally advised patient that if these conditions are not met then IM medications will be administered.

## 2015-12-16 NOTE — ED Notes (Signed)
Pt upset d/t plan is for her to be d/c'd to home possibly tomorrow. States she has a court date and does not want it to get changed again. Pt returned to room after venting feelings/concerns.

## 2015-12-16 NOTE — ED Notes (Signed)
Pt noted to be restless - placing blankets on her bed and rearranging bed - pt asked prior to doing so.

## 2015-12-16 NOTE — BH Assessment (Signed)
Writer conducted reassessment of patient via teleassessment machine. Pt is cooperative. She sts she slept well and is going to take a nap soon. Pt reports she ate all her breakfast. Pt denies SI and HI. She denies [facility]. Pt reports concern about lack of money and she thinks someone might have taken money from her ATM card, although she still has her debit card in her possession. She says she needs to speak to someone about housing. She also asks to go home. Writer explains that TTS is still searching for inpatient placement for her. Writer sts she will try to get ED CSW to speak with her about housing concerns. Jinny Blossom NP continues to seek inpatient placement.  Arnold Long, [place] Therapeutic Triage Specialist

## 2015-12-16 NOTE — ED Notes (Signed)
Pt ambulatory around nurses' desk w/sitter.

## 2015-12-16 NOTE — ED Notes (Signed)
Pt is more calm but continues to walk around the RN station; Pt is currently using the phone

## 2015-12-16 NOTE — ED Notes (Signed)
TTS being performed.  

## 2015-12-16 NOTE — ED Notes (Signed)
Pt given sugar to put into her apple sauce as requested.

## 2015-12-16 NOTE — ED Notes (Signed)
Pt ambulatory to desk and gave RN picture she had colored.

## 2015-12-16 NOTE — ED Notes (Addendum)
Pt on phone at nurses' desk - advising person she has a court date tomorrow. Stating "everybody in here trying to do black magic on me". States "I know what they're doing."

## 2015-12-16 NOTE — ED Notes (Signed)
RN spoke with Patient's [name] [name] via phone; [name] [name] was unable to speak with pt as she had just been administered Geodon; [name] [name] was provided with visiting hours and states he will make every effort to stop by to see pt on tomorrow during provided times.

## 2015-12-16 NOTE — ED Notes (Signed)
Pt has returned to room w/sitter.

## 2015-12-16 NOTE — ED Notes (Signed)
Pt ambulating around nurses' desk. Pt advising she saw "some big tall guy who's skin turned red because I saw him in the sun".

## 2015-12-16 NOTE — ED Notes (Signed)
Sitting on bed coloring.

## 2015-12-17 DIAGNOSIS — Z818 Family history of other mental and behavioral disorders: Secondary | ICD-10-CM | POA: Diagnosis not present

## 2015-12-17 DIAGNOSIS — F3164 Bipolar disorder, current episode mixed, severe, with psychotic features: Secondary | ICD-10-CM | POA: Diagnosis not present

## 2015-12-17 DIAGNOSIS — Z8489 Family history of other specified conditions: Secondary | ICD-10-CM | POA: Diagnosis not present

## 2015-12-17 DIAGNOSIS — Z811 Family history of alcohol abuse and dependence: Secondary | ICD-10-CM | POA: Diagnosis not present

## 2015-12-17 NOTE — ED Notes (Signed)
Ambulated patient for approximately 30 minutes after patient showered.

## 2015-12-17 NOTE — ED Notes (Signed)
Patient came by desk requesting to add more to her already lengthy order of breakfast. Stated that people were working vudoo or "Radiation protection practitioner". She stated that if they don't get her breakfast correct then she will get in trouble again for being hateful.We reminded her that her breakfast had already been ordered and it would be here soon.

## 2015-12-17 NOTE — ED Notes (Signed)
Brought patient drink apple juice and water.

## 2015-12-17 NOTE — ED Notes (Signed)
Involuntary Commitment rescinded.

## 2015-12-17 NOTE — ED Notes (Signed)
Began sitting for patient.

## 2015-12-17 NOTE — ED Provider Notes (Signed)
  Physical Exam  BP 114/69 (BP Location: Left Arm)   Pulse (!) 59   Temp 97.7 F (36.5 C) (Oral)   Resp 20   SpO2 100%   Physical Exam  ED Course  Procedures  MDM Seen by psychiatry and cleared for discharge. Continue same medications.       Davonna Belling, MD 12/17/15 1400

## 2015-12-17 NOTE — ED Notes (Signed)
Dr Louretta Shorten at bedside to see patient.  Patient to be discharged home and to follow up with [facility] outpatient per Dr Louretta Shorten.

## 2015-12-17 NOTE — Consult Note (Signed)
[facility] Face-to-face Consultation   Reason for Consult:  Mood swings Referring Physician:  EDP Patient Identification: [name] [name] MRN:  [id number] Principal Diagnosis: Bipolar disorder, curr episode mixed, severe, with psychotic features [facility]) Diagnosis:   Patient Active Problem List   Diagnosis Date Noted  . Bipolar disorder ([place]) [F31.9] 11/12/2015  . Bipolar disorder, curr episode mixed, severe, with psychotic features ([place]) [F31.64] 08/02/2014  . Cannabis use disorder, severe, dependence ([employer]) [F12.20] 08/02/2014  . Recurrent UTI [N39.0] 05/15/2013  . PCOS (polycystic ovarian syndrome) [E28.2] 02/26/2012  . Hypomenorrhea/oligomenorrhea [N91.5] 02/26/2012   Total Time spent with patient: 30 minutes   Subjective:   [name] [name] is a 30 y.o. female patient admitted with severe psychosis and aggression.   HPI: [name] [name] is a 30 years old female, seen, chart reviewed including previous psychiatric evaluation completed by Heloise Purpura, NP for this face-to-face psychiatric consultation and evaluation of bipolar disorder. Patient reportedly has no complaints today, patient reported she wanted to be discharged with a letter to the court clerk so that she can attend for a court date today so that she can avoid charges against her. Patient denies current symptoms of depression, auditory/visual hallucinations, delusions or paranoia. Patient has some mild symptoms of tangentiality and circumstantiality but no pressured speech today. Patient does endorse smoking marijuana before coming to the emergency department but denies using cocaine. Patient has no irritability, agitation or aggressive behaviors. Patient denies current suicidal/homicidal ideation, intention or plans. Patient has been compliant with her medication and she knows the names of the medication, doses she has been taking and she also willing to follow with outpatient medication management. Patient reported she is not  home last any longer because she has an apartment. Patient reported one of the staff RN been mean to her yesterday when she become mean back to them and required IM injections. Patient is also willing to take her medication by mouth without resistance. Case discussed with the staff RN who reported patient has been doing well without any significant behavioral or emotional problems since yesterday.   Past Psychiatric History: BiPolar Disorder severe, recurrent, mixed features, Polysubstance abuse, schizophrenia  Risk to Self: Suicidal Ideation: No Suicidal Intent: No Is patient at risk for suicide?: No Suicidal Plan?: No Access to Means: No What has been your use of drugs/alcohol within the last 12 months?: pt UDS +THC, per chart pt admits to cocaine use How many times?:  (UTA) Other Self Harm Risks: non-compliant with medications per triage Triggers for Past Attempts: Unknown Intentional Self Injurious Behavior:  (UTA) Risk to Others:  Prior Inpatient Therapy: Prior Inpatient Therapy: Yes (Per Chart) Prior Therapy Dates: Unk Reason for Treatment: hx of schizophrenia (Per Chart) Prior Outpatient Therapy: Prior Outpatient Therapy: Yes Prior Therapy Dates: ongoing Prior Therapy Facilty/Provider(s): [facility] Reason for Treatment: h/o PTSD & Schizophrenia per chart Does patient have an ACCT team?: Unknown Does patient have Intensive In-House Services?  : Unknown Does patient have [facility] services? : Yes Does patient have P4[facility] services?: No  Past Medical History:  Past Medical History:  Diagnosis Date  . Anxiety   . Bipolar disorder ([place])   . Boils   . Headache(784.0)   . Mental disorder   . Polycystic ovarian syndrome   . Tumor of ovary     Past Surgical History:  Procedure Laterality Date  . TUMOR REMOVAL     Family History:  Family History  Problem Relation Age of Onset  . Mental illness Mother   .  Thyroid disease Mother   . Alcohol abuse Brother    Family Psychiatric   History: Unknown Social History:  History  Alcohol Use No    Comment: no alcohol for over a year     History  Drug Use  . Types: Marijuana, Cocaine    Comment: smokes marijuana every day and no cocaine for almost a yearLAST  USE OF COCAINE MARCH,  LAST  MARIJUANA-  YESTERDAY     Social History   Social History  . Marital status: Divorced    Spouse name: N/A  . Number of children: N/A  . Years of education: N/A   Social History Main Topics  . Smoking status: Former Smoker    Packs/day: 0.25    Types: Cigarettes  . Smokeless tobacco: Never Used  . Alcohol use No     Comment: no alcohol for over a year  . Drug use:     Types: Marijuana, Cocaine     Comment: smokes marijuana every day and no cocaine for almost a yearLAST  USE OF COCAINE MARCH,  LAST  MARIJUANA-  YESTERDAY   . Sexual activity: Yes    Birth control/ protection: None   Other Topics Concern  . None   Social History Narrative  . None   Additional Social History:    Allergies:   Allergies  Allergen Reactions  . Ibuprofen Nausea And Vomiting    Stomach upset    Labs:  No results found for this or any previous visit (from the past 48 hour(s)).  Current Facility-Administered Medications  Medication Dose Route Frequency Provider Last Rate Last Dose  . acetaminophen (TYLENOL) tablet 650 mg  650 mg Oral Q4H PRN Domenic Moras, PA-C      . alum & mag hydroxide-simeth (MAALOX/MYLANTA) 200-200-20 MG/5ML suspension 30 mL  30 mL Oral PRN Domenic Moras, PA-C      . escitalopram (LEXAPRO) tablet 10 mg  10 mg Oral Daily Domenic Moras, PA-C      . lamoTRIgine (LAMICTAL) tablet 25 mg  25 mg Oral Daily Domenic Moras, PA-C      . LORazepam (ATIVAN) tablet 1 mg  1 mg Oral Q8H PRN Domenic Moras, PA-C      . metFORMIN (GLUCOPHAGE) tablet 500 mg  500 mg Oral BID WC Domenic Moras, PA-C      . nicotine (NICODERM CQ - dosed in mg/24 hours) patch 21 mg  21 mg Transdermal Daily Domenic Moras, PA-C      . OLANZapine (ZYPREXA) tablet 5 mg  5 mg Oral  Daily Benjamine Mola, FNP   5 mg at 12/17/15 1104  . OLANZapine zydis (ZYPREXA) disintegrating tablet 10 mg  10 mg Oral QHS Ethelene Hal, NP   10 mg at 12/16/15 2100  . ondansetron (ZOFRAN) tablet 4 mg  4 mg Oral Q8H PRN Domenic Moras, PA-C      . zolpidem (AMBIEN) tablet 5 mg  5 mg Oral QHS PRN Domenic Moras, PA-C       Current Outpatient Prescriptions  Medication Sig Dispense Refill  . benztropine (COGENTIN) 0.5 MG tablet Take 1 tablet (0.5 mg total) by mouth at bedtime. (Patient taking differently: Take 0.5 mg by mouth daily. ) 30 tablet 0  . lamoTRIgine (LAMICTAL) 25 MG tablet Take 1 tablet (25 mg total) by mouth daily. For mood stability 30 tablet 0  . OLANZapine (ZYPREXA) 10 MG tablet Take 10 mg by mouth at bedtime.    . medroxyPROGESTERone (PROVERA) 10 MG tablet Take  1 tablet (10 mg total) by mouth daily. Use for ten days (Patient not taking: Reported on 11/11/2015) 10 tablet 0  . metFORMIN (GLUCOPHAGE) 500 MG tablet Take 1 tablet (500 mg total) by mouth 2 (two) times daily with a meal. (Patient not taking: Reported on 12/17/2015) 60 tablet 5  . OLANZapine (ZYPREXA) 5 MG tablet Take 1 tablet (5 mg total) by mouth at bedtime. (Patient not taking: Reported on 12/17/2015) 30 tablet 0  . PRENATAL 27-1 MG TABS Take 1 tablet by mouth daily. (Patient not taking: Reported on 12/17/2015) 30 each 0  . traZODone (DESYREL) 100 MG tablet Take 1 tablet (100 mg total) by mouth at bedtime as needed for sleep. (Patient not taking: Reported on 12/17/2015) 30 tablet 0    Musculoskeletal: Unable to assess, camera  Psychiatric Specialty Exam: Physical Exam  Review of Systems  Psychiatric/Behavioral: Positive for hallucinations and suicidal ideas. The patient is nervous/anxious.   All other systems reviewed and are negative.   Blood pressure 114/69, pulse (!) 59, temperature 97.7 F (36.5 C), temperature source Oral, resp. rate 20, SpO2 100 %.There is no height or weight on file to calculate BMI.   General Appearance: Casual and Fairly Groomed  Eye Contact:  Fair  Speech:  Clear, coherent, mildly pressured  Volume:  Increased  Mood:  Anxious  Affect:  Congruent  Thought Process:  Tangential although improving  Orientation:  Full (Time, Place, and Person)  Thought Content: Denied paranoia and hallucinations    Suicidal Thoughts:  No  Homicidal Thoughts:  No  Memory:  Immediate;   Fair Recent;   Fair Remote;   Fair  Judgement:  Poor  Insight:  Lacking  Psychomotor Activity:  Normal  Concentration:  Concentration: Fair and Attention Span: Fair  Recall:  Poor  Fund of Knowledge:  Good  Language:  Good  Akathisia:  No  Handed:  Right  AIMS (if indicated):     Assets:  Agricultural consultant Resilience Social Support  ADL's:  Intact  Cognition:  WNL  Sleep:      Treatment Plan Summary: Bipolar disorder, curr episode mixed, severe, with psychotic features ([place])   Patient has been calm and cooperative and currently denied symptoms of depression, mania, anxiety, psychosis. Patient has no current suicidal/homicidal ideation, intention or plans. Patient contract for safety. Patient willing to follow up with outpatient medication management and also take care of her core day today patient is requesting a letter of be in hospital so that she can present with a court clerk. Patient seems to be sober from her drug of abuse and currently feeling back at baseline.  Patient does not meet criteria for acute psychiatric hospitalization so she will be referred to the outpatient medication management and counseling services at [facility]. Patient is willing to follow-up with her current medications as listed below.  Case discussed with the staff RN in the emergency department and informed about the changes in disposition plan.  Medications: Continue Zyprexa 10mg  po qhs and Zyprexa 5mg  po qam Continue Lexapro 10mg  po daily for  MDD Continue Lamictal 25mg  po daily for mood stabilization Continue Trazodone 50mg  po qhs prn insomnia   Disposition: No evidence of imminent risk to self or others at present.   Patient does not meet criteria for psychiatric inpatient admission. Supportive therapy provided about ongoing stressors.   Ambrose Finland, MD 12/17/2015 11:16 AM

## 2015-12-17 NOTE — ED Notes (Signed)
Eggs and a [place]ie ordered for patient.

## 2016-04-25 ENCOUNTER — Encounter: Payer: Self-pay | Admitting: Obstetrics & Gynecology

## 2016-04-25 ENCOUNTER — Ambulatory Visit (INDEPENDENT_AMBULATORY_CARE_PROVIDER_SITE_OTHER): Payer: Medicaid Other | Admitting: Obstetrics & Gynecology

## 2016-04-25 VITALS — BP 109/74 | HR 87 | Wt 224.8 lb

## 2016-04-25 DIAGNOSIS — N915 Oligomenorrhea, unspecified: Secondary | ICD-10-CM | POA: Diagnosis present

## 2016-04-25 DIAGNOSIS — E282 Polycystic ovarian syndrome: Secondary | ICD-10-CM

## 2016-04-25 DIAGNOSIS — Z01419 Encounter for gynecological examination (general) (routine) without abnormal findings: Secondary | ICD-10-CM

## 2016-04-25 MED ORDER — PROMETHAZINE HCL 25 MG PO TABS: 25.0000 mg | ORAL_TABLET | Freq: Four times a day (QID) | ORAL | 1 refills | Status: DC | PRN

## 2016-04-25 NOTE — Progress Notes (Signed)
Patient ID: [name] [name], female   DOB: March 10, 1985, 31 y.o.   MRN: [id number]  Chief Complaint  Patient presents with  . Gynecologic Exam    HPI [name] [name] is a 31 y.o. female.   HPI [name] [name] is a 31 y.o. female.  G0P0 Patient's last menstrual period was 03/30/2016. She has regular light menses with no hormone therapy of metformin. She wants to conceive and has been with the same partner for 6 years. He has one 64 y/o child. Past Medical History:  Diagnosis Date  . Anxiety   . Bipolar disorder ([place])   . Boils   . Headache(784.0)   . Mental disorder   . Polycystic ovarian syndrome   . Tumor of ovary     Past Surgical History:  Procedure Laterality Date  . TUMOR REMOVAL    left salpingectomy and paratubal cyst removed 2008  Family History  Problem Relation Age of Onset  . Mental illness Mother   . Thyroid disease Mother   . Alcohol abuse Brother     Social History Social History  Substance Use Topics  . Smoking status: Former Smoker    Packs/day: 0.25    Types: Cigarettes  . Smokeless tobacco: Never Used  . Alcohol use No     Comment: no alcohol for over a year    Allergies  Allergen Reactions  . Ibuprofen Nausea And Vomiting    Stomach upset    Current Outpatient Prescriptions  Medication Sig Dispense Refill  . benztropine (COGENTIN) 0.5 MG tablet Take 1 tablet (0.5 mg total) by mouth at bedtime. (Patient taking differently: Take 0.5 mg by mouth daily. ) 30 tablet 0  . lamoTRIgine (LAMICTAL) 25 MG tablet Take 1 tablet (25 mg total) by mouth daily. For mood stability 30 tablet 0  . medroxyPROGESTERone (PROVERA) 10 MG tablet Take 1 tablet (10 mg total) by mouth daily. Use for ten days (Patient not taking: Reported on 11/11/2015) 10 tablet 0  . metFORMIN (GLUCOPHAGE) 500 MG tablet Take 1 tablet (500 mg total) by mouth 2 (two) times daily with a meal. (Patient not taking: Reported on 12/17/2015) 60 tablet 5  . OLANZapine (ZYPREXA) 10  MG tablet Take 10 mg by mouth at bedtime.    Marland Kitchen OLANZapine (ZYPREXA) 5 MG tablet Take 1 tablet (5 mg total) by mouth at bedtime. (Patient not taking: Reported on 12/17/2015) 30 tablet 0  . PRENATAL 27-1 MG TABS Take 1 tablet by mouth daily. (Patient not taking: Reported on 12/17/2015) 30 each 0  . promethazine (PHENERGAN) 25 MG tablet Take 1 tablet (25 mg total) by mouth every 6 (six) hours as needed for nausea or vomiting. 30 tablet 1  . traZODone (DESYREL) 100 MG tablet Take 1 tablet (100 mg total) by mouth at bedtime as needed for sleep. (Patient not taking: Reported on 12/17/2015) 30 tablet 0   No current facility-administered medications for this visit.     Review of Systems Review of Systems  Constitutional: Negative.   Respiratory: Negative.   Gastrointestinal: Negative.   Genitourinary: Negative.     Blood pressure 109/74, pulse 87, weight 224 lb 12.8 oz (102 kg), last menstrual period 03/30/2016.  Physical Exam Physical Exam  Constitutional: She is oriented to person, place, and time. She appears well-developed. No distress.  Cardiovascular: Normal rate.   Pulmonary/Chest: Effort normal. No respiratory distress.  Breasts: breasts appear normal, no suspicious masses, no skin or nipple changes or axillary nodes.   Abdominal: Soft. There  is no tenderness.  Genitourinary:  Genitourinary Comments: Pelvic exam: normal external genitalia, vulva, vagina, cervix, uterus and adnexa.   Neurological: She is alert and oriented to person, place, and time.  Skin: Skin is warm and dry.  Psychiatric: She has a normal mood and affect. Her behavior is normal.    Data Reviewed Pap 2016 normal  Assessment    Secondary infertility with h/o PCOS Well woman exam Normal menstrual cycles with no treatment H/O left salpingectomy    Plan    Consider infertility evaluation May be female factor or tubal factor RTC yearly exam       [name] 04/25/2016, 11:27 AM

## 2016-09-02 IMAGING — US US TRANSVAGINAL NON-OB
1 series · 13 of 25 positions shown · non-contrast
Comparison: Prior pelvic ultrasound 05/12/2013

CLINICAL DATA: 29-year-old female with abdominal swelling and
history of prior left para ovarian cyst removal in 0370 and
polycystic ovarian syndrome.

EXAM:
TRANSABDOMINAL AND TRANSVAGINAL ULTRASOUND OF PELVIS
TECHNIQUE: Both transabdominal and transvaginal ultrasound examinations of the
pelvis were performed. Transabdominal technique was performed for
global imaging of the pelvis including uterus, ovaries, adnexal
regions, and pelvic cul-de-sac. It was necessary to proceed with
endovaginal exam following the transabdominal exam to visualize the
bilateral ovaries and adnexa.

[Series 1: us pelvis complete · 13 of 72 slices shown]
[im 1/72]
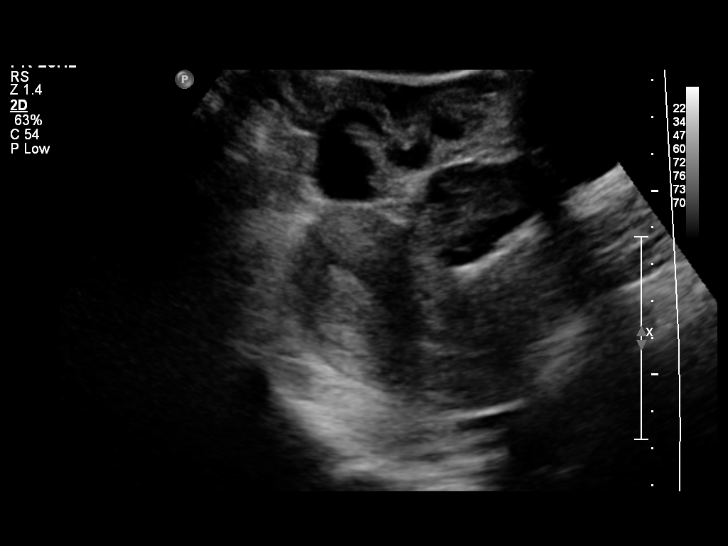
[im 6/72]
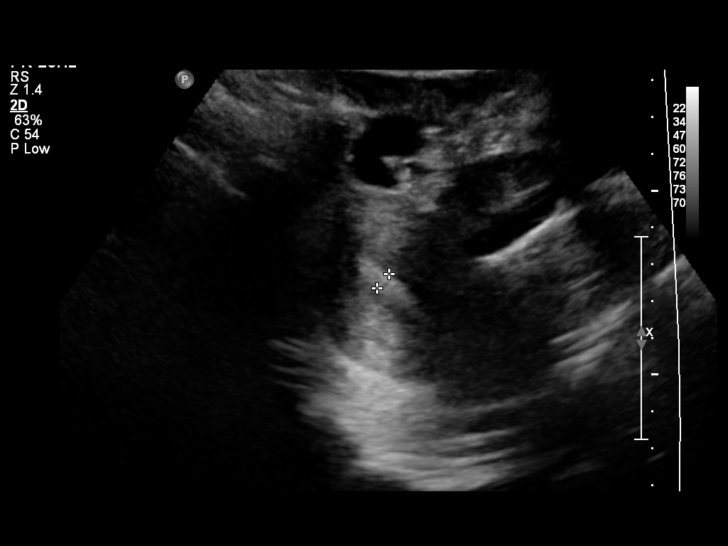
[im 12/72]
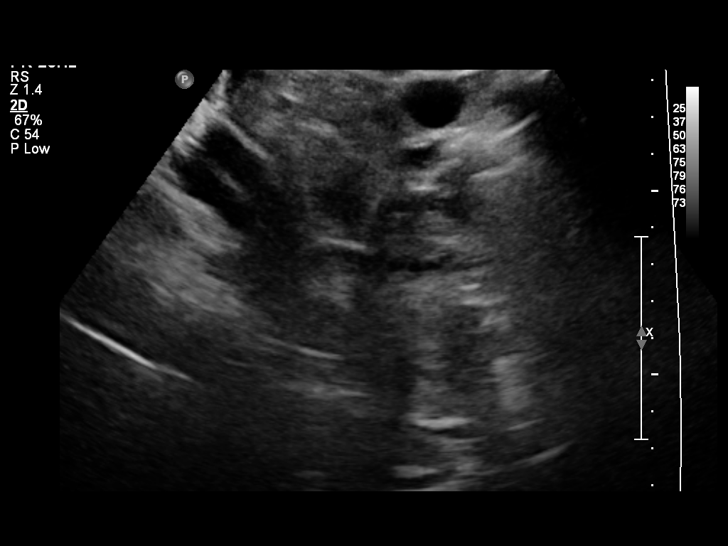
[im 18/72]
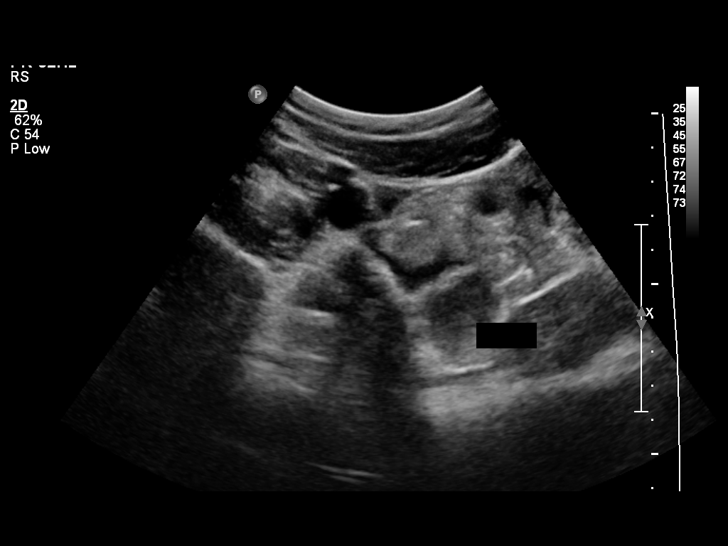
[im 24/72]
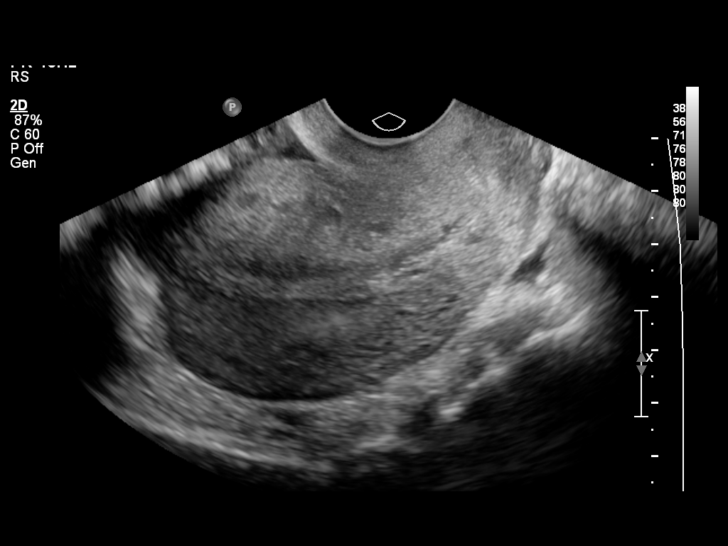
[im 30/72]
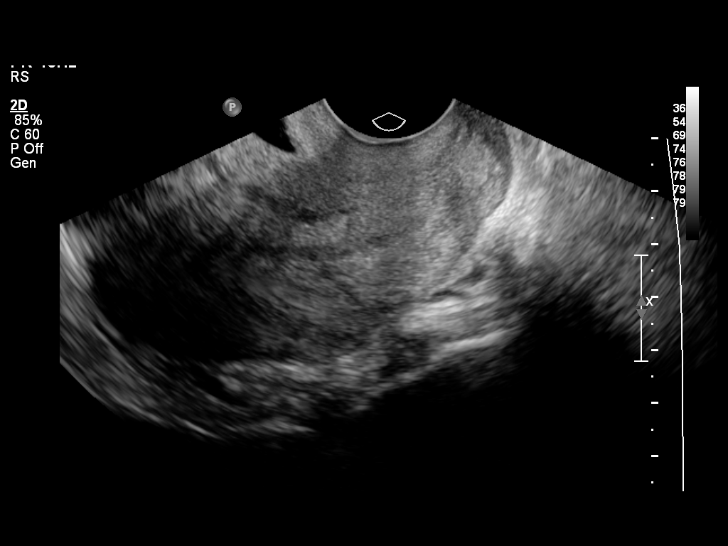
[im 36/72]
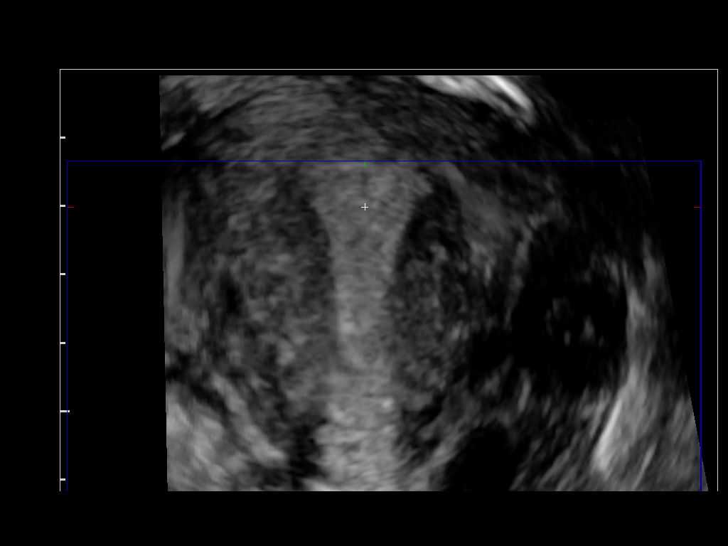
[im 42/72]
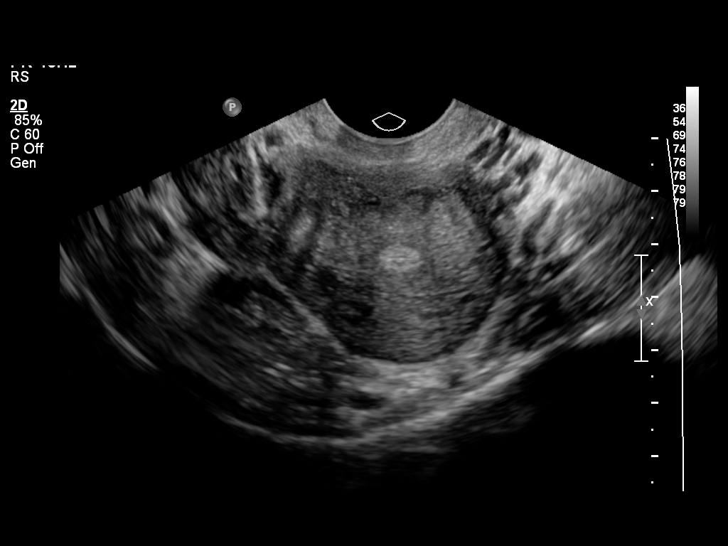
[im 48/72]
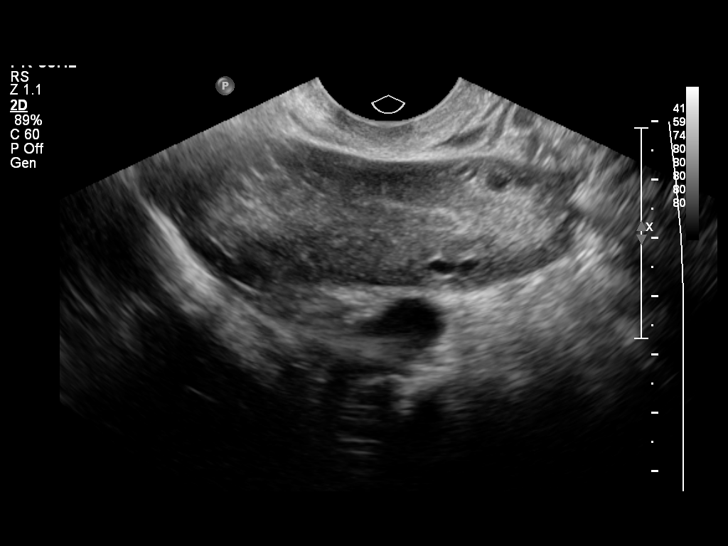
[im 54/72]
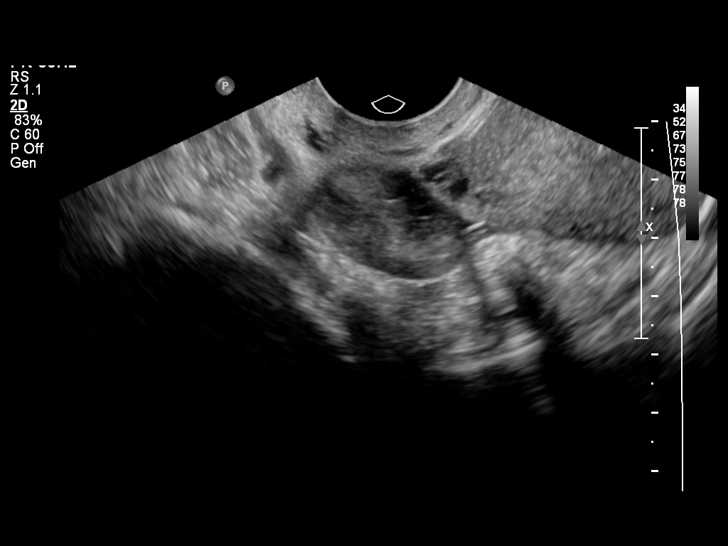
[im 60/72]
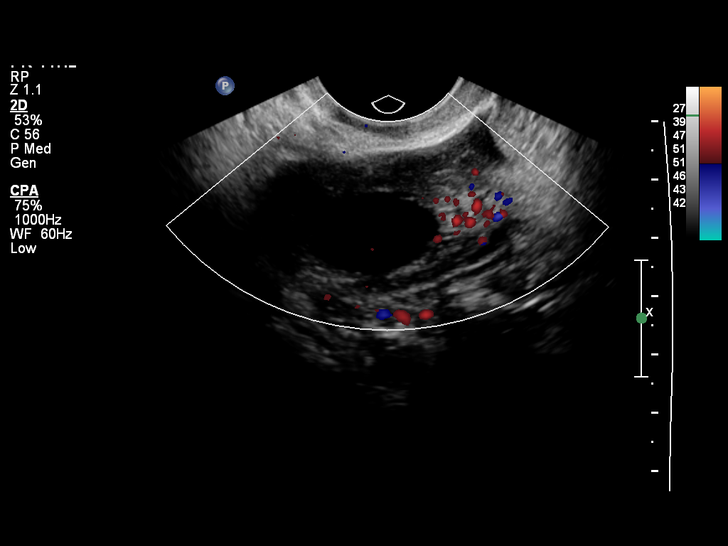
[im 66/72]
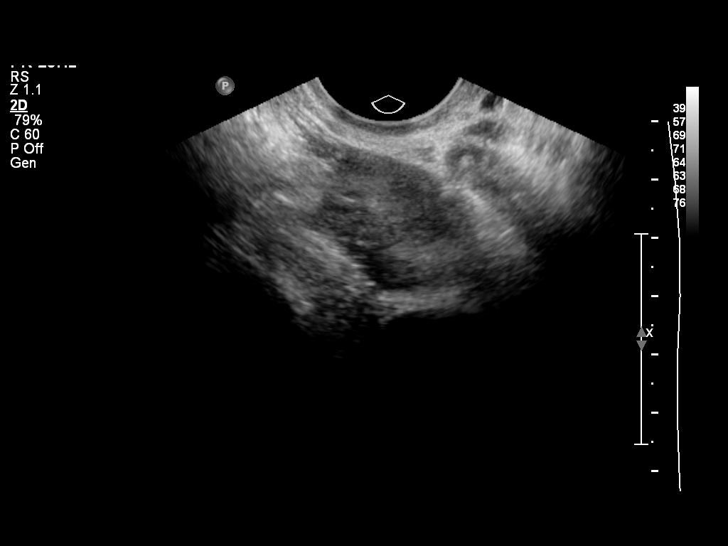
[im 72/72]
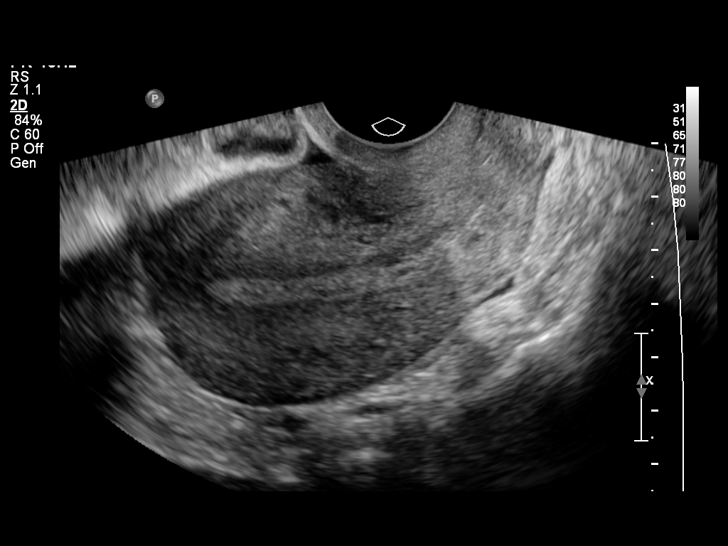

[13 of 25 positions shown; findings below may reference images not displayed]

FINDINGS: Uterus

Measurements: 7.4 x 4.5 x 5.2 cm. No fibroids or other mass
visualized.

Endometrium

Thickness: 5 mm.  No focal abnormality visualized.

Right ovary

Measurements: 7.2 x 2.0 x 2.8 cm (previously 6.8 x 3.0 x 2.2 cm).
Mildly enlarged but otherwise normal in appearance. No adnexal mass.

Left ovary

Measurements: 6.3 x 2.7 x 3.5 cm (previously 4.1 x 2.6 x 2.3 cm).
Mildly enlarged. There is a 2.9 cm anechoic simple cyst consistent
with a dominant follicle. This is a finding of no clinical
significance.

Other findings

Trace free fluid in the pelvis is likely physiologic.
IMPRESSION: 1. Similar appearance of enlarged ovaries bilaterally which can be
seen in the setting of polycystic ovarian syndrome.
2. Dominant follicular cyst on the left noted incidentally.

## 2016-12-18 ENCOUNTER — Encounter (HOSPITAL_COMMUNITY): Payer: Self-pay | Admitting: Emergency Medicine

## 2016-12-18 DIAGNOSIS — Z5321 Procedure and treatment not carried out due to patient leaving prior to being seen by health care provider: Secondary | ICD-10-CM | POA: Insufficient documentation

## 2016-12-18 DIAGNOSIS — R300 Dysuria: Secondary | ICD-10-CM | POA: Insufficient documentation

## 2016-12-18 LAB — URINALYSIS, ROUTINE W REFLEX MICROSCOPIC
BILIRUBIN URINE: NEGATIVE
Glucose, UA: NEGATIVE mg/dL
KETONES UR: 20 mg/dL — AB
Nitrite: NEGATIVE
Protein, ur: 30 mg/dL — AB
Specific Gravity, Urine: 1.021 (ref 1.005–1.030)
pH: 5 (ref 5.0–8.0)

## 2016-12-18 LAB — POC URINE PREG, ED: Preg Test, Ur: NEGATIVE

## 2016-12-18 NOTE — ED Triage Notes (Signed)
Pt states she has pain up under her ribs and is c/o painful urination with urgency and frequency

## 2016-12-19 ENCOUNTER — Emergency Department (HOSPITAL_COMMUNITY)
Admission: EM | Admit: 2016-12-19 | Discharge: 2016-12-19 | Disposition: A | Discharge status: Home / Self Care | Payer: Medicaid Other | Attending: Emergency Medicine | Admitting: Emergency Medicine

## 2016-12-19 ENCOUNTER — Encounter (HOSPITAL_COMMUNITY): Payer: Self-pay | Admitting: Emergency Medicine

## 2016-12-19 ENCOUNTER — Ambulatory Visit (HOSPITAL_COMMUNITY)
Admission: EM | Admit: 2016-12-19 | Discharge: 2016-12-19 | Disposition: A | Discharge status: Home / Self Care | Payer: Medicaid Other | Attending: Internal Medicine | Admitting: Internal Medicine

## 2016-12-19 DIAGNOSIS — F419 Anxiety disorder, unspecified: Secondary | ICD-10-CM | POA: Insufficient documentation

## 2016-12-19 DIAGNOSIS — N3090 Cystitis, unspecified without hematuria: Secondary | ICD-10-CM | POA: Insufficient documentation

## 2016-12-19 DIAGNOSIS — F319 Bipolar disorder, unspecified: Secondary | ICD-10-CM | POA: Insufficient documentation

## 2016-12-19 DIAGNOSIS — R350 Frequency of micturition: Secondary | ICD-10-CM | POA: Diagnosis present

## 2016-12-19 DIAGNOSIS — F17210 Nicotine dependence, cigarettes, uncomplicated: Secondary | ICD-10-CM | POA: Insufficient documentation

## 2016-12-19 MED ORDER — CEPHALEXIN 500 MG PO CAPS: 500.0000 mg | ORAL_CAPSULE | Freq: Four times a day (QID) | ORAL | 0 refills | Status: AC

## 2016-12-19 NOTE — ED Notes (Signed)
Pt states she is going to leave  Pt states she will call and get her results tomorrow

## 2016-12-19 NOTE — ED Triage Notes (Signed)
Pt c/o painful urination with urgency and frequency, thinks she has a UTI.

## 2016-12-19 NOTE — ED Provider Notes (Signed)
[place]    CSN: [id number] Arrival date & time: 12/19/16  1008     History   Chief Complaint Chief Complaint  Patient presents with  . Urinary Tract Infection    HPI [name] [name] is a 31 y.o. female.   31 year old female comes in for 1.5 week history of urinary frequency, urgency, dysuria. Has been having nausea with the symptoms without vomiting. Denies fever, chills, night sweats. Has noticed mild low abdominal pain. States she went to the ED yesterday, but left due to wait time. Due to dysuria, she tried AZO with some relief. Came in today for further evaluation.       Past Medical History:  Diagnosis Date  . Anxiety   . Bipolar disorder ([place])   . Boils   . Headache(784.0)   . Mental disorder   . Polycystic ovarian syndrome   . Tumor of ovary     Patient Active Problem List   Diagnosis Date Noted  . Bipolar disorder ([place]) 11/12/2015  . Bipolar disorder, curr episode mixed, severe, with psychotic features ([place]) 08/02/2014  . Cannabis use disorder, severe, dependence ([place]) 08/02/2014  . Recurrent UTI 05/15/2013  . PCOS (polycystic ovarian syndrome) 02/26/2012  . Hypomenorrhea/oligomenorrhea 02/26/2012    Past Surgical History:  Procedure Laterality Date  . LAPAROSCOPIC UNILATERAL SALPINGECTOMY Left    paratubal cystectomy    OB History    Gravida Para Term Preterm AB Living   0             SAB TAB Ectopic Multiple Live Births                   Home Medications    Prior to Admission medications   Medication Sig Start Date End Date Taking? Authorizing Provider  benztropine (COGENTIN) 0.5 MG tablet Take 1 tablet (0.5 mg total) by mouth at bedtime. Patient taking differently: Take 0.5 mg by mouth daily.  08/04/14  Yes Niel Hummer, NP  lamoTRIgine (LAMICTAL) 25 MG tablet Take 1 tablet (25 mg total) by mouth daily. For mood stability 08/04/14  Yes Niel Hummer, NP  OLANZapine (ZYPREXA) 5 MG tablet Take 1 tablet (5 mg total) by  mouth at bedtime. 08/04/14  Yes Niel Hummer, NP  promethazine (PHENERGAN) 25 MG tablet Take 1 tablet (25 mg total) by mouth every 6 (six) hours as needed for nausea or vomiting. 04/25/16  Yes Woodroe Mode, MD  cephALEXin (KEFLEX) 500 MG capsule Take 1 capsule (500 mg total) by mouth 4 (four) times daily. 12/19/16 12/29/16  Ok Edwards, PA-C    Family History Family History  Problem Relation Age of Onset  . Mental illness Mother   . Thyroid disease Mother   . Alcohol abuse Brother     Social History Social History  Substance Use Topics  . Smoking status: Current Every Day Smoker    Packs/day: 0.25    Types: Cigarettes  . Smokeless tobacco: Never Used  . Alcohol use Yes     Comment: rare     Allergies   Ibuprofen   Review of Systems Review of Systems  Reason unable to perform ROS: See HPI as above.     Physical Exam Triage Vital Signs ED Triage Vitals [12/19/16 1035]  Enc Vitals Group     BP 132/84     Pulse Rate 100     Resp 16     Temp 98.5 F (36.9 C)  Temp Source Oral     SpO2 100 %     Weight      Height      Head Circumference      Peak Flow      Pain Score 8     Pain Loc      Pain Edu?      Excl. in [place]?    No data found.   Updated Vital Signs BP 132/84   Pulse 100   Temp 98.5 F (36.9 C) (Oral)   Resp 16   LMP 12/01/2016 (Approximate)   SpO2 100%   Physical Exam  Constitutional: She is oriented to person, place, and time. She appears well-developed and well-nourished. No distress.  Eyes: Pupils are equal, round, and reactive to light. Conjunctivae are normal.  Cardiovascular: Normal rate, regular rhythm and normal heart sounds.  Exam reveals no gallop and no friction rub.   No murmur heard. Pulmonary/Chest: Effort normal and breath sounds normal. She has no wheezes. She has no rales.  Abdominal: Soft. Bowel sounds are normal. She exhibits no distension. There is no tenderness. There is CVA tenderness (right). There is no rebound and no  guarding.  Neurological: She is alert and oriented to person, place, and time.  Skin: Skin is warm and dry.     UC Treatments / Results  Labs (all labs ordered are listed, but only abnormal results are displayed) Labs Reviewed  URINE CULTURE    EKG  EKG Interpretation None       Radiology No results found.  Procedures Procedures (including critical care time)  Medications Ordered in UC Medications - No data to display   Initial Impression / Assessment and Plan / UC Course  I have reviewed the triage vital signs and the nursing notes.  Pertinent labs & imaging results that were available during my care of the patient were reviewed by me and considered in my medical decision making (see chart for details).    Given CVA tenderness on the right, will cover for pyelonephritis. Start keflex as directed. Push fluids. Return precautions given.   Final Clinical Impressions(s) / UC Diagnoses   Final diagnoses:  Cystitis    New Prescriptions Discharge Medication List as of 12/19/2016 10:58 AM    START taking these medications   Details  cephALEXin (KEFLEX) 500 MG capsule Take 1 capsule (500 mg total) by mouth 4 (four) times daily., Starting Fri 12/19/2016, Until Mon 12/29/2016, Normal          Yu, Amy V, PA-C 12/19/16 1117

## 2016-12-19 NOTE — Discharge Instructions (Signed)
Your urine was positive for an urinary tract infection. Start Keflex as directed. Keep hydrated, your urine should be clear to pale yellow in color. Monitor for any worsening of symptoms, fever, worsening abdominal pain, nausea/vomiting, flank pain, follow up for reevaluation.

## 2016-12-22 LAB — URINE CULTURE: Culture: 40000 — AB

## 2017-05-23 ENCOUNTER — Encounter (HOSPITAL_COMMUNITY): Payer: Self-pay | Admitting: Emergency Medicine

## 2017-05-23 ENCOUNTER — Ambulatory Visit (HOSPITAL_COMMUNITY)
Admission: EM | Admit: 2017-05-23 | Discharge: 2017-05-23 | Disposition: A | Discharge status: Home / Self Care | Payer: Medicaid Other | Attending: Family Medicine | Admitting: Family Medicine

## 2017-05-23 ENCOUNTER — Other Ambulatory Visit: Payer: Self-pay

## 2017-05-23 DIAGNOSIS — F17210 Nicotine dependence, cigarettes, uncomplicated: Secondary | ICD-10-CM | POA: Diagnosis not present

## 2017-05-23 DIAGNOSIS — Z711 Person with feared health complaint in whom no diagnosis is made: Secondary | ICD-10-CM

## 2017-05-23 DIAGNOSIS — N760 Acute vaginitis: Secondary | ICD-10-CM | POA: Diagnosis not present

## 2017-05-23 DIAGNOSIS — F319 Bipolar disorder, unspecified: Secondary | ICD-10-CM | POA: Diagnosis not present

## 2017-05-23 DIAGNOSIS — M545 Low back pain: Secondary | ICD-10-CM | POA: Diagnosis present

## 2017-05-23 DIAGNOSIS — R300 Dysuria: Secondary | ICD-10-CM | POA: Diagnosis not present

## 2017-05-23 DIAGNOSIS — Z3202 Encounter for pregnancy test, result negative: Secondary | ICD-10-CM | POA: Diagnosis not present

## 2017-05-23 DIAGNOSIS — N898 Other specified noninflammatory disorders of vagina: Secondary | ICD-10-CM | POA: Diagnosis not present

## 2017-05-23 DIAGNOSIS — Z113 Encounter for screening for infections with a predominantly sexual mode of transmission: Secondary | ICD-10-CM | POA: Diagnosis not present

## 2017-05-23 DIAGNOSIS — Z79899 Other long term (current) drug therapy: Secondary | ICD-10-CM | POA: Insufficient documentation

## 2017-05-23 LAB — POCT URINALYSIS DIP (DEVICE)
BILIRUBIN URINE: NEGATIVE
Glucose, UA: NEGATIVE mg/dL
Hgb urine dipstick: NEGATIVE
Ketones, ur: NEGATIVE mg/dL
NITRITE: NEGATIVE
PH: 6 (ref 5.0–8.0)
Protein, ur: NEGATIVE mg/dL
Specific Gravity, Urine: 1.01 (ref 1.005–1.030)
Urobilinogen, UA: 0.2 mg/dL (ref 0.0–1.0)

## 2017-05-23 LAB — POCT PREGNANCY, URINE: PREG TEST UR: NEGATIVE

## 2017-05-23 MED ORDER — AZITHROMYCIN 250 MG PO TABS
1000.0000 mg | ORAL_TABLET | Freq: Once | ORAL | Status: AC
  Administered 2017-05-23: 1000 mg via ORAL

## 2017-05-23 MED ORDER — AZITHROMYCIN 250 MG PO TABS
ORAL_TABLET | ORAL | Status: AC
  Filled 2017-05-23: qty 4

## 2017-05-23 MED ORDER — CEPHALEXIN 500 MG PO CAPS: 500.0000 mg | ORAL_CAPSULE | Freq: Two times a day (BID) | ORAL | 0 refills | Status: AC

## 2017-05-23 MED ORDER — CEFTRIAXONE SODIUM 250 MG IJ SOLR
INTRAMUSCULAR | Status: AC
  Filled 2017-05-23: qty 250

## 2017-05-23 MED ORDER — CEFTRIAXONE SODIUM 250 MG IJ SOLR
250.0000 mg | Freq: Once | INTRAMUSCULAR | Status: AC
  Administered 2017-05-23: 250 mg via INTRAMUSCULAR

## 2017-05-23 NOTE — ED Triage Notes (Signed)
uti symptoms for a week.  C/o lower back pain.  Reports taking mothers script for keflex.    Vaginal burning, itchy, whitish/yellow discharge present per patient

## 2017-05-23 NOTE — ED Provider Notes (Signed)
[name] [name]    CSN: [id number] Arrival date & time: 05/23/17  1918     History   Chief Complaint Chief Complaint  Patient presents with  . Urinary Tract Infection    HPI [name] [name] is a 32 y.o. female.   [name] presents with complaints of low back pain, pain with urination and frequency which started 1 week ago. She states felt similar to previous UTI's she has had in the past so she started Azo and then yesterday took keflex her mother had left ofter. She has taken 5 doses of keflex. Urinary symptosm have improved. Also with vaginal discharge for the past week. No odor or itching.this started after having unprotected sex with a new partner. She has two partners. She states she is concerned about std's. Has had in the past but was years ago. LMP was 1 month ago. Denies abdominal pain. Denies  Sores or lesions to vulva. No fevers.    ROS per HPI.       Past Medical History:  Diagnosis Date  . Anxiety   . Bipolar disorder ([place])   . Boils   . Headache(784.0)   . Mental disorder   . Polycystic ovarian syndrome   . Tumor of ovary     Patient Active Problem List   Diagnosis Date Noted  . Bipolar disorder ([place]) 11/12/2015  . Bipolar disorder, curr episode mixed, severe, with psychotic features ([place]) 08/02/2014  . Cannabis use disorder, severe, dependence ([place]) 08/02/2014  . Recurrent UTI 05/15/2013  . PCOS (polycystic ovarian syndrome) 02/26/2012  . Hypomenorrhea/oligomenorrhea 02/26/2012    Past Surgical History:  Procedure Laterality Date  . LAPAROSCOPIC UNILATERAL SALPINGECTOMY Left    paratubal cystectomy    OB History    Gravida  0   Para      Term      Preterm      AB      Living        SAB      TAB      Ectopic      Multiple      Live Births               Home Medications    Prior to Admission medications   Medication Sig Start Date End Date Taking? Authorizing Provider  benztropine (COGENTIN) 0.5 MG tablet  Take 1 tablet (0.5 mg total) by mouth at bedtime. Patient taking differently: Take 0.5 mg by mouth daily.  08/04/14   Niel Hummer, NP  cephALEXin (KEFLEX) 500 MG capsule Take 1 capsule (500 mg total) by mouth 2 (two) times daily for 7 days. 05/23/17 05/30/17  Zigmund Gottron, NP  lamoTRIgine (LAMICTAL) 25 MG tablet Take 1 tablet (25 mg total) by mouth daily. For mood stability 08/04/14   Niel Hummer, NP  OLANZapine (ZYPREXA) 5 MG tablet Take 1 tablet (5 mg total) by mouth at bedtime. 08/04/14   Niel Hummer, NP  promethazine (PHENERGAN) 25 MG tablet Take 1 tablet (25 mg total) by mouth every 6 (six) hours as needed for nausea or vomiting. 04/25/16   Woodroe Mode, MD    Family History Family History  Problem Relation Age of Onset  . Mental illness Mother   . Thyroid disease Mother   . Alcohol abuse Brother     Social History Social History   Tobacco Use  . Smoking status: Current Every Day Smoker    Packs/day: 0.25    Types: Cigarettes  .  Smokeless tobacco: Never Used  Substance Use Topics  . Alcohol use: Yes    Comment: rare  . Drug use: Yes    Types: Marijuana    Comment: smokes marijuana every day and no cocaine for almost a yearLAST  USE OF COCAINE MARCH,  LAST  MARIJUANA-  YESTERDAY      Allergies   Ibuprofen   Review of Systems Review of Systems   Physical Exam Triage Vital Signs ED Triage Vitals  Enc Vitals Group     BP 05/23/17 1949 132/76     Pulse Rate 05/23/17 1949 89     Resp 05/23/17 1949 18     Temp 05/23/17 1949 (!) 97.4 F (36.3 C)     Temp Source 05/23/17 1949 Oral     SpO2 05/23/17 1949 100 %     Weight --      Height --      Head Circumference --      Peak Flow --      Pain Score 05/23/17 1951 6     Pain Loc --      Pain Edu? --      Excl. in [place]? --    No data found.  Updated Vital Signs BP 132/76 (BP Location: Left Arm)   Pulse 89   Temp (!) 97.4 F (36.3 C) (Oral)   Resp 18   SpO2 100%   Visual Acuity Right Eye Distance:     Left Eye Distance:   Bilateral Distance:    Right Eye Near:   Left Eye Near:    Bilateral Near:     Physical Exam  Constitutional: She is oriented to person, place, and time. She appears well-developed and well-nourished. No distress.  Cardiovascular: Normal rate, regular rhythm and normal heart sounds.  Pulmonary/Chest: Effort normal and breath sounds normal.  Abdominal: Soft. She exhibits no distension and no mass. There is no tenderness. There is no rigidity, no rebound, no guarding, no CVA tenderness and negative Murphy's sign.  Denies vaginal bleeding, abdominal pain vulvar lesions or open sores; gu exam deferred   Neurological: She is alert and oriented to person, place, and time.  Skin: Skin is warm and dry.     UC Treatments / Results  Labs (all labs ordered are listed, but only abnormal results are displayed) Labs Reviewed  POCT URINALYSIS DIP (DEVICE) - Abnormal; Notable for the following components:      Result Value   Leukocytes, UA SMALL (*)    All other components within normal limits  URINE CULTURE  POCT PREGNANCY, URINE  URINE CYTOLOGY ANCILLARY ONLY    EKG None Radiology No results found.  Procedures Procedures (including critical care time)  Medications Ordered in UC Medications  azithromycin (ZITHROMAX) tablet 1,000 mg (1,000 mg Oral Given 05/23/17 2032)  cefTRIAXone (ROCEPHIN) injection 250 mg (250 mg Intramuscular Given 05/23/17 2032)     Initial Impression / Assessment and Plan / UC Course  I have reviewed the triage vital signs and the nursing notes.  Pertinent labs & imaging results that were available during my care of the patient were reviewed by me and considered in my medical decision making (see chart for details).     Urine sent for culture at this time, she has been taking keflex. Leukocytes to urine, will complete course of keflex. Empiric zithromax and rocephin provided at this time, urine cytology pending. Push fluid intake. Will  notify of any positive findings and if any changes to treatment are  needed.  Patient verbalized understanding and agreeable to plan.    Final Clinical Impressions(s) / UC Diagnoses   Final diagnoses:  Dysuria  Concern about STD in female without diagnosis  Acute vaginitis    ED Discharge Orders        Ordered    cephALEXin (KEFLEX) 500 MG capsule  2 times daily     05/23/17 2024       Controlled Substance Prescriptions [place] Controlled Substance Registry consulted? Not Applicable   Zigmund Gottron, NP 05/23/17 2101

## 2017-05-23 NOTE — Discharge Instructions (Signed)
We have treated you today for gonorrhea and chlamydia.  Please complete course of treatment for UTI as well.  I have sent your urine to be cultured and testing it for vaginal infections as well. Will notify of any positive findings and if any changes to treatment are needed.   Please withhold from intercourse for the next week. Please use condoms to prevent STD's.

## 2017-05-25 ENCOUNTER — Telehealth (HOSPITAL_COMMUNITY): Payer: Self-pay | Admitting: Emergency Medicine

## 2017-05-25 LAB — URINE CULTURE: CULTURE: NO GROWTH

## 2017-05-25 LAB — URINE CYTOLOGY ANCILLARY ONLY
Chlamydia: NEGATIVE
Neisseria Gonorrhea: NEGATIVE
Trichomonas: POSITIVE — AB

## 2017-05-25 NOTE — Telephone Encounter (Signed)
Pt called requesting lab results; informed on negative urine culture and pt sts she had been taking antibiotics prior to visit; STD screening still pending

## 2017-05-26 ENCOUNTER — Telehealth (HOSPITAL_COMMUNITY): Payer: Self-pay | Admitting: Emergency Medicine

## 2017-05-26 ENCOUNTER — Telehealth (HOSPITAL_COMMUNITY): Payer: Self-pay | Admitting: Internal Medicine

## 2017-05-26 LAB — URINE CYTOLOGY ANCILLARY ONLY: Candida vaginitis: NEGATIVE

## 2017-05-26 MED ORDER — METRONIDAZOLE 500 MG PO TABS: 500.0000 mg | ORAL_TABLET | Freq: Two times a day (BID) | ORAL | 0 refills | Status: DC

## 2017-05-26 NOTE — Telephone Encounter (Deleted)
Clinical staff, please let patient know that test for trichomonas was positive.  Rx metronidazole was sent to the pharmacy of record, [facility] on [address].  Please refrain from sexual intercourse for 7 days to give the medicine time to work.  Sexual partners need to be notified and tested/treated.  Condoms may reduce risk of reinfection.  Recheck for further evaluation if symptoms are not improving. LM

## 2017-05-26 NOTE — Telephone Encounter (Signed)
Note opened in error.  LM 

## 2017-05-26 NOTE — Telephone Encounter (Signed)
Pt called for results and informed of being positive for trichomonas; to send RX for flagyl to pharmacy per Dr Mannie Stabile

## 2017-06-02 ENCOUNTER — Telehealth (HOSPITAL_COMMUNITY): Payer: Self-pay

## 2017-06-02 NOTE — Telephone Encounter (Signed)
Pt contacted regarding recent visit and test results. Verbalized understanding.

## 2018-12-05 ENCOUNTER — Inpatient Hospital Stay (HOSPITAL_COMMUNITY)
Admission: AD | Admit: 2018-12-05 | Discharge: 2018-12-05 | Discharge status: Against Medical Advice | Payer: Medicaid Other | Attending: Obstetrics and Gynecology | Admitting: Obstetrics and Gynecology

## 2018-12-05 ENCOUNTER — Other Ambulatory Visit: Payer: Self-pay

## 2018-12-05 DIAGNOSIS — Z5321 Procedure and treatment not carried out due to patient leaving prior to being seen by health care provider: Secondary | ICD-10-CM | POA: Insufficient documentation

## 2018-12-05 DIAGNOSIS — R1030 Lower abdominal pain, unspecified: Secondary | ICD-10-CM | POA: Diagnosis not present

## 2018-12-05 LAB — POCT PREGNANCY, URINE: Preg Test, Ur: NEGATIVE

## 2018-12-05 NOTE — MAU Note (Signed)
Not in lobby

## 2018-12-05 NOTE — MAU Note (Signed)
[name] [name] is a 33 y.o. at here in MAU reporting: having lower abdominal pain and upper abdominal swelling for the past week or so. Has not done UPT at home. No vaginal bleeding, no abnormal discharge.  LMP: unknown, "one day last month"  Onset of complaint: the past week  Pain score: 5/10  Vitals:   12/05/18 1856  BP: 119/71  Pulse: 82  Resp: 18  Temp: 98.1 F (36.7 C)  SpO2: 97%      Lab orders placed from triage: UPT

## 2018-12-05 NOTE — MAU Provider Note (Signed)
Not in lobby.  Tamala Julian, [place], [place] 12/05/2018 7:10 PM

## 2018-12-09 ENCOUNTER — Other Ambulatory Visit: Payer: Self-pay

## 2018-12-09 DIAGNOSIS — Z20822 Contact with and (suspected) exposure to covid-19: Secondary | ICD-10-CM

## 2018-12-10 LAB — NOVEL CORONAVIRUS, NAA: SARS-CoV-2, NAA: NOT DETECTED

## 2019-01-18 DIAGNOSIS — F17200 Nicotine dependence, unspecified, uncomplicated: Secondary | ICD-10-CM

## 2019-01-18 HISTORY — DX: Nicotine dependence, unspecified, uncomplicated: F17.200

## 2019-06-27 ENCOUNTER — Ambulatory Visit (HOSPITAL_COMMUNITY)
Admission: EM | Admit: 2019-06-27 | Discharge: 2019-06-27 | Disposition: A | Discharge status: Home / Self Care | Payer: Medicaid Other | Attending: Family Medicine | Admitting: Family Medicine

## 2019-06-27 ENCOUNTER — Other Ambulatory Visit: Payer: Self-pay

## 2019-06-27 ENCOUNTER — Encounter (HOSPITAL_COMMUNITY): Payer: Self-pay

## 2019-06-27 DIAGNOSIS — S39012A Strain of muscle, fascia and tendon of lower back, initial encounter: Secondary | ICD-10-CM | POA: Diagnosis not present

## 2019-06-27 MED ORDER — TIZANIDINE HCL 4 MG PO TABS: 4.0000 mg | ORAL_TABLET | Freq: Four times a day (QID) | ORAL | 0 refills | Status: DC | PRN

## 2019-06-27 MED ORDER — NAPROXEN 500 MG PO TABS: 500.0000 mg | ORAL_TABLET | Freq: Two times a day (BID) | ORAL | 0 refills | Status: DC

## 2019-06-27 NOTE — Discharge Instructions (Signed)
Take naproxen 2 times a day with food This will help with inflammation and pain in your back Take tizanidine as needed muscle relaxer This is helpful at bedtime Use ice or heat to low back Stretch and gentle movements throughout the day Return if not improving by the end of the week
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
## 2019-12-23 ENCOUNTER — Emergency Department (HOSPITAL_COMMUNITY)
Admission: EM | Admit: 2019-12-23 | Discharge: 2019-12-23 | Disposition: A | Discharge status: Home / Self Care | Payer: Medicaid Other | Attending: Emergency Medicine | Admitting: Emergency Medicine

## 2019-12-23 ENCOUNTER — Other Ambulatory Visit: Payer: Self-pay

## 2019-12-23 ENCOUNTER — Emergency Department (HOSPITAL_COMMUNITY): Payer: Medicaid Other

## 2019-12-23 ENCOUNTER — Encounter (HOSPITAL_COMMUNITY): Payer: Self-pay | Admitting: Emergency Medicine

## 2019-12-23 DIAGNOSIS — W19XXXD Unspecified fall, subsequent encounter: Secondary | ICD-10-CM | POA: Diagnosis not present

## 2019-12-23 DIAGNOSIS — F17210 Nicotine dependence, cigarettes, uncomplicated: Secondary | ICD-10-CM | POA: Insufficient documentation

## 2019-12-23 DIAGNOSIS — W19XXXA Unspecified fall, initial encounter: Secondary | ICD-10-CM

## 2019-12-23 DIAGNOSIS — M549 Dorsalgia, unspecified: Secondary | ICD-10-CM | POA: Insufficient documentation

## 2019-12-23 DIAGNOSIS — Y9302 Activity, running: Secondary | ICD-10-CM | POA: Insufficient documentation

## 2019-12-23 DIAGNOSIS — M25552 Pain in left hip: Secondary | ICD-10-CM | POA: Diagnosis not present

## 2019-12-23 DIAGNOSIS — M533 Sacrococcygeal disorders, not elsewhere classified: Secondary | ICD-10-CM | POA: Diagnosis not present

## 2019-12-23 LAB — I-STAT BETA HCG BLOOD, ED (MC, WL, AP ONLY): I-stat hCG, quantitative: <5 m[IU]/mL (ref <5)

## 2019-12-23 MED ORDER — METHOCARBAMOL 500 MG PO TABS: 500.0000 mg | ORAL_TABLET | Freq: Two times a day (BID) | ORAL | 0 refills | Status: DC

## 2019-12-23 MED ORDER — HYDROCODONE-ACETAMINOPHEN 5-325 MG PO TABS
1.0000 | ORAL_TABLET | Freq: Once | ORAL | Status: DC
  Filled 2019-12-23: qty 1

## 2019-12-23 NOTE — ED Notes (Signed)
Pt was discharged but refused discharge questions and vitals. She took discharge paperwork and left.
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
## 2019-12-23 NOTE — ED Provider Notes (Signed)
[facility] DEPT Provider Note   CSN: [id number] Arrival date & time: 12/23/19  1911     History Chief Complaint  Patient presents with  . Fall    [name] [name] is a 34 y.o. female with past medical history significant for anxiety, bipolar, PCOS who presents for evaluation of sacral pain and left hip pain.  Patient states she was running away from a spider 2 days ago when she subsequently fell and landed on her sacrum and left hip.  Patient has had pain since then. States she has been unable to sit on her bottom since then due to pain. Has been able to walk however states it is painful.  Has not take anything for symptoms.  Rates her pain a 10/10.  Denies fever, chills, nausea, vomiting, abdominal pain, pelvic pain, vaginal discharge, dysuria, hematuria, flank pain, redness, swelling, warmth to extremities.  Denies chance of pregnancy. No IV drug use, bowel or bladder incontinence, saddle paresthesia.  Denies additional aggravating or alleviating factors.  History obtained from patient and past medical records.  No interpreter used.  HPI     Past Medical History:  Diagnosis Date  . Anxiety   . Bipolar disorder ([place])   . Boils   . Headache(784.0)   . Mental disorder   . Polycystic ovarian syndrome   . Tumor of ovary     Patient Active Problem List   Diagnosis Date Noted  . Bipolar disorder ([place]) 11/12/2015  . Bipolar disorder, curr episode mixed, severe, with psychotic features ([place]) 08/02/2014  . Cannabis use disorder, severe, dependence ([place]) 08/02/2014  . Recurrent UTI 05/15/2013  . PCOS (polycystic ovarian syndrome) 02/26/2012  . Hypomenorrhea/oligomenorrhea 02/26/2012    Past Surgical History:  Procedure Laterality Date  . LAPAROSCOPIC UNILATERAL SALPINGECTOMY Left    paratubal cystectomy     OB History    Gravida  0   Para      Term      Preterm      AB      Living        SAB      TAB      Ectopic       Multiple      Live Births              Family History  Problem Relation Age of Onset  . Mental illness Mother   . Thyroid disease Mother   . Alcohol abuse Brother     Social History   Tobacco Use  . Smoking status: Current Every Day Smoker    Packs/day: 0.25    Types: Cigarettes  . Smokeless tobacco: Never Used  Substance Use Topics  . Alcohol use: Yes    Comment: rare  . Drug use: Yes    Types: Marijuana    Comment: smokes marijuana every day and no cocaine for almost a yearLAST  USE OF COCAINE MARCH,  LAST  MARIJUANA-  YESTERDAY     Home Medications Prior to Admission medications   Medication Sig Start Date End Date Taking? Authorizing Provider  methocarbamol (ROBAXIN) 500 MG tablet Take 1 tablet (500 mg total) by mouth 2 (two) times daily. 12/23/19   Daylin Gruszka A, PA-C  naproxen (NAPROSYN) 500 MG tablet Take 1 tablet (500 mg total) by mouth 2 (two) times daily. 06/27/19   Raylene Everts, MD  OLANZapine (ZYPREXA) 5 MG tablet Take 1 tablet (5 mg total) by mouth at bedtime. 08/04/14   Rosana Hoes,  Hewitt Shorts, NP  tiZANidine (ZANAFLEX) 4 MG tablet Take 1-2 tablets (4-8 mg total) by mouth every 6 (six) hours as needed for muscle spasms. 06/27/19   Raylene Everts, MD  benztropine (COGENTIN) 0.5 MG tablet Take 1 tablet (0.5 mg total) by mouth at bedtime. Patient taking differently: Take 0.5 mg by mouth daily.  08/04/14 06/27/19  Niel Hummer, NP  lamoTRIgine (LAMICTAL) 25 MG tablet Take 1 tablet (25 mg total) by mouth daily. For mood stability 08/04/14 06/27/19  Niel Hummer, NP  promethazine (PHENERGAN) 25 MG tablet Take 1 tablet (25 mg total) by mouth every 6 (six) hours as needed for nausea or vomiting. 04/25/16 06/27/19  Woodroe Mode, MD    Allergies    Ibuprofen  Review of Systems   Review of Systems  Constitutional: Negative.   HENT: Negative.   Respiratory: Negative.   Cardiovascular: Negative.   Gastrointestinal: Negative.   Genitourinary: Negative.    Musculoskeletal: Positive for back pain.       Back, sacrum and left hip pain  Skin: Negative.   Neurological: Negative.   All other systems reviewed and are negative.   Physical Exam Updated Vital Signs BP 125/76 (BP Location: Left Arm)   Pulse (!) 101   Temp 98.7 F (37.1 C) (Oral)   Resp 20   Ht 5\' 11"  (1.803 m)   Wt 117.9 kg   SpO2 100%   BMI 36.26 kg/m   Physical Exam Musculoskeletal:       Legs:     Physical Exam  Constitutional: Pt appears well-developed and well-nourished. No distress.  HENT:  Head: Normocephalic and atraumatic.  Mouth/Throat: Oropharynx is clear and moist. No oropharyngeal exudate.  Eyes: Conjunctivae are normal.  Neck: Normal range of motion. Neck supple.  Full ROM without pain  Cardiovascular: Normal rate, regular rhythm and intact distal pulses.   Pulmonary/Chest: Effort normal and breath sounds normal. No respiratory distress. Pt has no wheezes.  Abdominal: Soft. Pt exhibits no distension. There is no tenderness, rebound or guarding. No abd bruit or pulsatile mass. No suprapubic tenderness. No obvious hernias. Non tender to inguinal crease. Musculoskeletal:  Full range of motion of the T-spine and L-spine with flexion, hyperextension, and lateral flexion. No midline tenderness or stepoffs. No tenderness to palpation of the spinous processes of the T-spine or L-spine. No tenderness to palpation of the paraspinous muscles of the L-spine. Unable to perform straight leg raise on left secondary to pain.  Diffuse pain over posterior, lateral left hip.Tenderness over sacrum. No sacral obvious abscess, fluctuance or induration. No shortening or rotation of legs. Wiggles toes without difficulty. Lymphadenopathy:    Pt has no cervical adenopathy.  Neurological: Pt is alert. Pt has normal reflexes.  Reflex Scores:      Bicep reflexes are 2+ on the right side and 2+ on the left side.      Brachioradialis reflexes are 2+ on the right side and 2+ on  the left side.      Patellar reflexes are 2+ on the right side and 2+ on the left side.      Achilles reflexes are 2+ on the right side and 2+ on the left side. Speech is clear and goal oriented, follows commands Normal 5/5 strength in upper and lower extremities bilaterally including dorsiflexion and plantar flexion, strong and equal grip strength Sensation normal to light and sharp touch Moves extremities without ataxia, coordination intact Normal gait Normal balance No Clonus Skin: Skin is warm  and dry. No rash noted or lesions noted. Pt is not diaphoretic. No erythema, ecchymosis,edema or warmth.  Psychiatric: Pt has a normal mood and affect. Behavior is normal.  Nursing note and vitals reviewed. ED Results / Procedures / Treatments   Labs (all labs ordered are listed, but only abnormal results are displayed) Labs Reviewed  I-STAT BETA HCG BLOOD, ED ([facility], [facility], [facility] ONLY)    EKG None  Radiology DG Sacrum/Coccyx  Result Date: 12/23/2019 CLINICAL DATA:  Pain after a fall EXAM: SACRUM AND COCCYX - 2+ VIEW COMPARISON:  None. FINDINGS: There is no evidence of fracture or other focal bone lesions. IMPRESSION: Negative. Electronically Signed   By: Lucienne Capers M.D.   On: 12/23/2019 21:16   DG Hip Unilat With Pelvis 2-3 Views Left  Result Date: 12/23/2019 CLINICAL DATA:  Pain after a fall EXAM: DG HIP (WITH OR WITHOUT PELVIS) 2-3V LEFT COMPARISON:  None. FINDINGS: Pelvis and left hip appear intact. No acute fracture or dislocation. No focal bone lesions. SI joints and symphysis pubis are nondisplaced. Soft tissues are unremarkable. IMPRESSION: Negative. Electronically Signed   By: Lucienne Capers M.D.   On: 12/23/2019 21:16    Procedures Procedures (including critical care time)  Medications Ordered in ED Medications  HYDROcodone-acetaminophen (NORCO/VICODIN) 5-325 MG per tablet 1 tablet (1 tablet Oral Refused 12/23/19 2019)   ED Course  I have reviewed the triage vital signs  and the nursing notes.  Pertinent labs & imaging results that were available during my care of the patient were reviewed by me and considered in my medical decision making (see chart for details).  63 presents for evaluation of back pain, sacral pain and left hip pain after running away from a spider 2 days ago with subsequent fall.  No overlying skin changes.  No abdominal pain, pelvic pain, dysuria, hematuria.  Significant tenderness to posterior, lateral left hip and into the sacrum. No overlying skin changes. No evidence of infectious process such as abscess. No acute no urinary complaints.  Abdomen soft, nontender.  Ambulatory from waiting room treatment room and RR without difficulty. Plan on pregnancy test, x-ray imaging and reassess  Pregnancy test negative.  Patient declines pain medication. Offered non narcotic pain medication however patient again declined.  DG pelvis, sacrum and left hip without any acute fracture or dislocation.  She has full range of motion of these areas.  I have low suspicion for septic joint, gout, hemarthrosis, myositis, VTE, occult fracture, neurosurgical emergency such as cauda equina, discitis, osteomyelitis, transverse myelitis, psoas abscess..  Reevaluation abdomen soft, nontender.  I have low suspicion for intraabdominal process such as appendicitis, bowel obstruction, bowel perforation, cholecystitis, diverticulitis, PID, torsion or ectopic pregnancy as cause of her sacral pain.  Patient discharged home with symptomatic treatment and given strict instructions for follow-up with their primary care physician.  I have also discussed reasons to return immediately to the ER.  Patient expresses understanding and agrees with plan.  The patient has been appropriately medically screened and/or stabilized in the ED. I have low suspicion for any other emergent medical condition which would require further screening, evaluation or treatment in the ED or require inpatient  management.  Patient is hemodynamically stable and in no acute distress.  Patient able to ambulate in department prior to ED.  Evaluation does not show acute pathology that would require ongoing or additional emergent interventions while in the emergency department or further inpatient treatment.  I have discussed the diagnosis with the patient and  answered all questions.  Pain is been managed while in the emergency department and patient has no further complaints prior to discharge.  Patient is comfortable with plan discussed in room and is stable for discharge at this time.  I have discussed strict return precautions for returning to the emergency department.  Patient was encouraged to follow-up with PCP/specialist refer to at discharge.    MDM Rules/Calculators/A&P                           Final Clinical Impression(s) / ED Diagnoses Final diagnoses:  Fall, initial encounter  Sacral pain    Rx / DC Orders ED Discharge Orders         Ordered    methocarbamol (ROBAXIN) 500 MG tablet  2 times daily        12/23/19 2124           Madelena Maturin A, PA-C 12/23/19 2134    Lacretia Leigh, MD 12/24/19 [phone]

## 2019-12-23 NOTE — Discharge Instructions (Signed)
Your x-ray did not show any evidence of broken bones.  It is likely you bruised your sacrum.  I would suggest taking anti-inflammatories.  I have also written you for muscle relaxers.  If you have any new worsening symptoms please seek reevaluation in the emergency department.

## 2019-12-24 ENCOUNTER — Emergency Department (HOSPITAL_COMMUNITY)
Admission: EM | Admit: 2019-12-24 | Discharge: 2019-12-25 | Disposition: A | Discharge status: Home / Self Care | Payer: Medicaid Other | Attending: Emergency Medicine | Admitting: Emergency Medicine

## 2019-12-24 ENCOUNTER — Encounter (HOSPITAL_COMMUNITY): Payer: Self-pay | Admitting: Emergency Medicine

## 2019-12-24 DIAGNOSIS — N760 Acute vaginitis: Secondary | ICD-10-CM | POA: Diagnosis not present

## 2019-12-24 DIAGNOSIS — R102 Pelvic and perineal pain: Secondary | ICD-10-CM | POA: Diagnosis present

## 2019-12-24 DIAGNOSIS — B9689 Other specified bacterial agents as the cause of diseases classified elsewhere: Secondary | ICD-10-CM | POA: Insufficient documentation

## 2019-12-24 DIAGNOSIS — A5901 Trichomonal vulvovaginitis: Secondary | ICD-10-CM | POA: Diagnosis not present

## 2019-12-24 LAB — URINALYSIS, ROUTINE W REFLEX MICROSCOPIC
Bacteria, UA: NONE SEEN
Glucose, UA: NEGATIVE mg/dL
Hgb urine dipstick: NEGATIVE
Ketones, ur: 80 mg/dL — AB
Leukocytes,Ua: NEGATIVE
Nitrite: NEGATIVE
Protein, ur: 30 mg/dL — AB
Specific Gravity, Urine: 1.024 (ref 1.005–1.030)
pH: 5 (ref 5.0–8.0)

## 2019-12-24 MED ORDER — ACETAMINOPHEN 325 MG PO TABS
650.0000 mg | ORAL_TABLET | Freq: Once | ORAL | Status: AC
  Administered 2019-12-24: 650 mg via ORAL
  Filled 2019-12-24: qty 2

## 2019-12-24 NOTE — ED Triage Notes (Signed)
Pt c/o severe lower abd/pelvis pain for several days, pt seen at [facility] last night to r/o pelvic fx from fall. Pt c/o dysuria, pelvic cramping, urinary retention. Pt reports she may have STI. Pt has been taking medication for UTI with no relief. Hx of severe kidney infections.

## 2019-12-25 ENCOUNTER — Other Ambulatory Visit: Payer: Self-pay

## 2019-12-25 LAB — WET PREP, GENITAL
Sperm: NONE SEEN
Yeast Wet Prep HPF POC: NONE SEEN

## 2019-12-25 MED ORDER — ACETAMINOPHEN 500 MG PO TABS
1000.0000 mg | ORAL_TABLET | Freq: Once | ORAL | Status: AC
  Administered 2019-12-25: 1000 mg via ORAL
  Filled 2019-12-25: qty 2

## 2019-12-25 MED ORDER — AZITHROMYCIN 250 MG PO TABS
1000.0000 mg | ORAL_TABLET | Freq: Once | ORAL | Status: AC
  Administered 2019-12-25: 1000 mg via ORAL
  Filled 2019-12-25: qty 4

## 2019-12-25 MED ORDER — CEFTRIAXONE SODIUM 500 MG IJ SOLR
250.0000 mg | Freq: Once | INTRAMUSCULAR | Status: AC
  Administered 2019-12-25: 250 mg via INTRAMUSCULAR
  Filled 2019-12-25: qty 500

## 2019-12-25 MED ORDER — FLUCONAZOLE 150 MG PO TABS: 150.0000 mg | ORAL_TABLET | Freq: Once | ORAL | 0 refills | Status: AC

## 2019-12-25 MED ORDER — METRONIDAZOLE 500 MG PO TABS: 500.0000 mg | ORAL_TABLET | Freq: Two times a day (BID) | ORAL | 0 refills | Status: DC

## 2019-12-25 NOTE — Discharge Instructions (Addendum)
Follow up with your doctor for further evaluation if symptoms persist.

## 2019-12-25 NOTE — ED Provider Notes (Signed)
[facility] EMERGENCY DEPARTMENT Provider Note   CSN: [id number] Arrival date & time: 12/24/19  2214     History Chief Complaint  Patient presents with  . Pelvic Pain    [name] [name] is a 34 y.o. female.  Patient to ED with suprapubic abdominal pain, urinary hesitancy for the past several days. She has a recent fall she felt was contributing to symptoms but reports being evaluated for fall injury last night at [facility] ED. She feels now like she might have a recurrent urinary tract infection despite being treated with Macrobid for the past 6 days. No fever, vomiting. She denies vaginal discharge, flank pain.   The history is provided by the patient. No language interpreter was used.       Past Medical History:  Diagnosis Date  . Anxiety   . Bipolar disorder ([place])   . Boils   . Headache(784.0)   . Mental disorder   . Polycystic ovarian syndrome   . Tumor of ovary     Patient Active Problem List   Diagnosis Date Noted  . Bipolar disorder ([place]) 11/12/2015  . Bipolar disorder, curr episode mixed, severe, with psychotic features ([place]) 08/02/2014  . Cannabis use disorder, severe, dependence ([place]) 08/02/2014  . Recurrent UTI 05/15/2013  . PCOS (polycystic ovarian syndrome) 02/26/2012  . Hypomenorrhea/oligomenorrhea 02/26/2012    Past Surgical History:  Procedure Laterality Date  . LAPAROSCOPIC UNILATERAL SALPINGECTOMY Left    paratubal cystectomy     OB History    Gravida  0   Para      Term      Preterm      AB      Living        SAB      TAB      Ectopic      Multiple      Live Births              Family History  Problem Relation Age of Onset  . Mental illness Mother   . Thyroid disease Mother   . Alcohol abuse Brother     Social History   Tobacco Use  . Smoking status: Current Every Day Smoker    Packs/day: 0.25    Types: Cigarettes  . Smokeless tobacco: Never Used  Substance Use Topics  . Alcohol use:  Yes    Comment: rare  . Drug use: Yes    Types: Marijuana    Comment: smokes marijuana every day and no cocaine for almost a yearLAST  USE OF COCAINE MARCH,  LAST  MARIJUANA-  YESTERDAY     Home Medications Prior to Admission medications   Medication Sig Start Date End Date Taking? Authorizing Provider  nitrofurantoin, macrocrystal-monohydrate, (MACROBID) 100 MG capsule Take 100 mg by mouth 2 (two) times daily. 12/16/19  Yes [provider]  methocarbamol (ROBAXIN) 500 MG tablet Take 1 tablet (500 mg total) by mouth 2 (two) times daily. Patient not taking: Reported on 12/25/2019 12/23/19   Henderly, Britni A, PA-C  naproxen (NAPROSYN) 500 MG tablet Take 1 tablet (500 mg total) by mouth 2 (two) times daily. Patient not taking: Reported on 12/25/2019 06/27/19   Raylene Everts, MD  OLANZapine (ZYPREXA) 5 MG tablet Take 1 tablet (5 mg total) by mouth at bedtime. Patient not taking: Reported on 12/25/2019 08/04/14   Niel Hummer, NP  tiZANidine (ZANAFLEX) 4 MG tablet Take 1-2 tablets (4-8 mg total) by mouth every 6 (six) hours  as needed for muscle spasms. Patient not taking: Reported on 12/25/2019 06/27/19   Raylene Everts, MD  benztropine (COGENTIN) 0.5 MG tablet Take 1 tablet (0.5 mg total) by mouth at bedtime. Patient taking differently: Take 0.5 mg by mouth daily.  08/04/14 06/27/19  Niel Hummer, NP  lamoTRIgine (LAMICTAL) 25 MG tablet Take 1 tablet (25 mg total) by mouth daily. For mood stability 08/04/14 06/27/19  Niel Hummer, NP  promethazine (PHENERGAN) 25 MG tablet Take 1 tablet (25 mg total) by mouth every 6 (six) hours as needed for nausea or vomiting. 04/25/16 06/27/19  Woodroe Mode, MD    Allergies    Ibuprofen  Review of Systems   Review of Systems  Constitutional: Negative for chills and fever.  HENT: Negative.   Respiratory: Negative.   Cardiovascular: Negative.   Gastrointestinal: Negative.  Negative for nausea and vomiting.  Genitourinary: Positive for  dysuria and pelvic pain. Negative for flank pain, vaginal bleeding and vaginal discharge.  Musculoskeletal: Negative.   Skin: Negative.   Neurological: Negative.     Physical Exam Updated Vital Signs BP 116/72   Pulse 63   Temp 98.6 F (37 C) (Oral)   Resp 16   SpO2 96%   Physical Exam Vitals and nursing note reviewed.  Constitutional:      Appearance: She is well-developed.  HENT:     Head: Normocephalic.  Cardiovascular:     Rate and Rhythm: Normal rate and regular rhythm.  Pulmonary:     Effort: Pulmonary effort is normal.     Breath sounds: Normal breath sounds. No wheezing, rhonchi or rales.  Abdominal:     General: Bowel sounds are normal.     Palpations: Abdomen is soft.     Tenderness: There is abdominal tenderness. There is no guarding or rebound.     Comments: Mild suprapubic tenderness.   Genitourinary:    Comments: Thick, white vaginal discharge present, yeast-like in appearance. No cervical discharge or CMT. No adnexal tenderness.  Musculoskeletal:        General: Normal range of motion.     Cervical back: Normal range of motion and neck supple.  Skin:    General: Skin is warm and dry.     Findings: No rash.  Neurological:     Mental Status: She is alert and oriented to person, place, and time.     ED Results / Procedures / Treatments   Labs (all labs ordered are listed, but only abnormal results are displayed) Labs Reviewed  WET PREP, GENITAL - Abnormal; Notable for the following components:      Result Value   Trich, Wet Prep PRESENT (*)    Clue Cells Wet Prep HPF POC PRESENT (*)    WBC, Wet Prep HPF POC FEW (*)    All other components within normal limits  URINALYSIS, ROUTINE W REFLEX MICROSCOPIC - Abnormal; Notable for the following components:   Color, Urine AMBER (*)    APPearance HAZY (*)    Bilirubin Urine SMALL (*)    Ketones, ur 80 (*)    Protein, ur 30 (*)    All other components within normal limits  GC/CHLAMYDIA PROBE AMP ([facility]) NOT AT [facility]   Results for orders placed or performed during the hospital encounter of 12/24/19  Wet prep, genital   Specimen: PATH Cytology Cervicovaginal Ancillary Only  Result Value Ref Range   Yeast Wet Prep HPF POC NONE SEEN NONE SEEN   Trich, Wet Prep  PRESENT (A) NONE SEEN   Clue Cells Wet Prep HPF POC PRESENT (A) NONE SEEN   WBC, Wet Prep HPF POC FEW (A) NONE SEEN   Sperm NONE SEEN   Urinalysis, Routine w reflex microscopic Urine, Clean Catch  Result Value Ref Range   Color, Urine AMBER (A) YELLOW   APPearance HAZY (A) CLEAR   Specific Gravity, Urine 1.024 1.005 - 1.030   pH 5.0 5.0 - 8.0   Glucose, UA NEGATIVE NEGATIVE mg/dL   Hgb urine dipstick NEGATIVE NEGATIVE   Bilirubin Urine SMALL (A) NEGATIVE   Ketones, ur 80 (A) NEGATIVE mg/dL   Protein, ur 30 (A) NEGATIVE mg/dL   Nitrite NEGATIVE NEGATIVE   Leukocytes,Ua NEGATIVE NEGATIVE   RBC / HPF 0-5 0 - 5 RBC/hpf   WBC, UA 0-5 0 - 5 WBC/hpf   Bacteria, UA NONE SEEN NONE SEEN   Squamous Epithelial / LPF 6-10 0 - 5   Mucus PRESENT   GC/Chlamydia probe amp  Result Value Ref Range   Neisseria Gonorrhea Negative    Chlamydia Negative    Comment Normal Reference Ranger Chlamydia - Negative    Comment      Normal Reference Range Neisseria Gonorrhea - Negative    EKG None  Radiology DG Sacrum/Coccyx  Result Date: 12/23/2019 CLINICAL DATA:  Pain after a fall EXAM: SACRUM AND COCCYX - 2+ VIEW COMPARISON:  None. FINDINGS: There is no evidence of fracture or other focal bone lesions. IMPRESSION: Negative. Electronically Signed   By: Lucienne Capers M.D.   On: 12/23/2019 21:16   DG Hip Unilat With Pelvis 2-3 Views Left  Result Date: 12/23/2019 CLINICAL DATA:  Pain after a fall EXAM: DG HIP (WITH OR WITHOUT PELVIS) 2-3V LEFT COMPARISON:  None. FINDINGS: Pelvis and left hip appear intact. No acute fracture or dislocation. No focal bone lesions. SI joints and symphysis pubis are nondisplaced. Soft tissues are  unremarkable. IMPRESSION: Negative. Electronically Signed   By: Lucienne Capers M.D.   On: 12/23/2019 21:16    Procedures Procedures (including critical care time)  Medications Ordered in ED Medications  cefTRIAXone (ROCEPHIN) injection 250 mg (has no administration in time range)  azithromycin (ZITHROMAX) tablet 1,000 mg (has no administration in time range)  acetaminophen (TYLENOL) tablet 650 mg (650 mg Oral Given 12/24/19 2238)  acetaminophen (TYLENOL) tablet 1,000 mg (1,000 mg Oral Given 12/25/19 0252)    ED Course  I have reviewed the triage vital signs and the nursing notes.  Pertinent labs & imaging results that were available during my care of the patient were reviewed by me and considered in my medical decision making (see chart for details).    MDM Rules/Calculators/A&P                          Patient to ED for further evaluation of pelvic pain, concerned for ongoing/recurrent UTI. Currently on Macrobid.   Pelvic exam is clinically positive for yeast vaginitis. She reports having a history of this with antibiotic use. Wet prep positive for both trichomonas and BV. Will add Flagyl to current regimen.   She is encouraged to follow up with her doctor if symptoms persist.     Final Clinical Impression(s) / ED Diagnoses Final diagnoses:  None   1. BV 2. Trichomoniasis  Rx / DC Orders ED Discharge Orders    None       Dennie Bible 12/27/19 0056    Fatima Blank, MD 12/27/19  0540  

## 2019-12-26 LAB — GC/CHLAMYDIA PROBE AMP (~~LOC~~) NOT AT ARMC
Chlamydia: NEGATIVE
Comment: NEGATIVE
Comment: NORMAL
Neisseria Gonorrhea: NEGATIVE

## 2020-03-02 ENCOUNTER — Other Ambulatory Visit: Payer: Self-pay

## 2020-03-02 ENCOUNTER — Inpatient Hospital Stay (HOSPITAL_COMMUNITY)
Admission: AD | Admit: 2020-03-02 | Discharge: 2020-03-02 | Disposition: A | Discharge status: Home / Self Care | Payer: Medicaid Other | Attending: Family Medicine | Admitting: Family Medicine

## 2020-03-02 ENCOUNTER — Inpatient Hospital Stay (HOSPITAL_COMMUNITY): Payer: Medicaid Other

## 2020-03-02 ENCOUNTER — Encounter (HOSPITAL_COMMUNITY): Payer: Self-pay | Admitting: Family Medicine

## 2020-03-02 DIAGNOSIS — Z3A08 8 weeks gestation of pregnancy: Secondary | ICD-10-CM | POA: Insufficient documentation

## 2020-03-02 DIAGNOSIS — R109 Unspecified abdominal pain: Secondary | ICD-10-CM

## 2020-03-02 DIAGNOSIS — O26891 Other specified pregnancy related conditions, first trimester: Secondary | ICD-10-CM

## 2020-03-02 DIAGNOSIS — R1030 Lower abdominal pain, unspecified: Secondary | ICD-10-CM | POA: Diagnosis present

## 2020-03-02 DIAGNOSIS — O208 Other hemorrhage in early pregnancy: Secondary | ICD-10-CM | POA: Insufficient documentation

## 2020-03-02 LAB — WET PREP, GENITAL
Sperm: NONE SEEN
Trich, Wet Prep: NONE SEEN
Yeast Wet Prep HPF POC: NONE SEEN

## 2020-03-02 LAB — CBC
HCT: 37.5 % (ref 36.0–46.0)
Hemoglobin: 12.7 g/dL (ref 12.0–15.0)
MCH: 32.3 pg (ref 26.0–34.0)
MCHC: 33.9 g/dL (ref 30.0–36.0)
MCV: 95.4 fL (ref 80.0–100.0)
Platelets: 285 10*3/uL (ref 150–400)
RBC: 3.93 MIL/uL (ref 3.87–5.11)
RDW: 12.2 % (ref 11.5–15.5)
WBC: 12.9 10*3/uL — ABNORMAL HIGH (ref 4.0–10.5)
nRBC: 0 % (ref 0.0–0.2)

## 2020-03-02 LAB — ABO/RH: ABO/RH(D): A POS

## 2020-03-02 LAB — HCG, QUANTITATIVE, PREGNANCY: hCG, Beta Chain, Quant, S: 82648 m[IU]/mL — ABNORMAL HIGH (ref <5)

## 2020-03-02 NOTE — MAU Note (Addendum)
Presents stating "I'm pregnant".  Reports went to [facility], told they couldn't do ultrasound or bloodwork, secondary limited service.  Reports LMP in September.  States Hx of previous ectopic and wants to know if "everything is ok".  Also reports having pain @ incision site on abdomen, states " I know its the scar tissue".

## 2020-03-03 NOTE — L&D Delivery Note (Signed)
Obstetrical Delivery Note   Date of Delivery:   10/07/2020 Primary OB:   [facility] Gestational Age/EDD: 23w2dReason for Admission: Elective IOL Antepartum complications: AMA, tobacco use, THC use, Bipolar, left hip labral tear  Delivered By:   CDurene RomansMD  Delivery Type:   vacuum, low  Delivery Details:   Called to see patient for urge to push. Patient DOA and +3 and pushed well to +4 in the next few contractions but then fetal bradycardia to 80s. Patient verbally consented for vacuum delivery and patient amenable. Foley removed and kiwi applied to flexion point and suction to green and with next contraction patient easily delivery baby. Light meconium noted and newborn immediately crying at delivery. 1st degree and left periuretheral repaired with 2-0 and 3-0 vicryl respectively.  Anesthesia:    epidural Intrapartum complications: None GBS:    Negative Laceration:    1st degree and left periurethral Episiotomy:    none Rectal exam:   deferred Placenta:    Delivered and expressed via active management. Intact: yes. To pathology: no.  Delayed Cord Clamping: yes Estimated Blood Loss:  1559m Baby:    Liveborn female, APGARs 9/9, weight 3535gm  ChDurene RomansMD Attending [facility]

## 2020-03-04 NOTE — MAU Provider Note (Signed)
S [name] is a 35 y.o. G1P0 pregnant female at unknown gestational age who presents to MAU today with complaint of +HPT with diffuse lower abdominal pain and anxiety regarding pregnancy viability as she has a history of ectopic with removal of a fallopian tube. She requested assessment but did not want to stay for the results because she did not have solid childcare available.  O BP 116/65 (BP Location: Right Arm)   Pulse 71   Temp 98.3 F (36.8 C) (Oral)   Resp 18   Ht 5\' 11"  (1.803 m)   Wt 212 lb 6.4 oz (96.3 kg)   LMP 06/06/2019   SpO2 100%   BMI 29.62 kg/m  Physical Exam Vitals and nursing note reviewed.  Constitutional:      General: She is not in acute distress.    Appearance: Normal appearance. She is normal weight. She is not ill-appearing.  HENT:     Nose: Nose normal.     Mouth/Throat:     Mouth: Mucous membranes are moist.  Eyes:     Pupils: Pupils are equal, round, and reactive to light.  Cardiovascular:     Rate and Rhythm: Normal rate.     Pulses: Normal pulses.  Pulmonary:     Effort: Pulmonary effort is normal.  Abdominal:     General: Bowel sounds are normal.     Palpations: Abdomen is soft.     Tenderness: There is no abdominal tenderness. There is no guarding.  Musculoskeletal:        General: Normal range of motion.     Cervical back: Normal range of motion.  Skin:    General: Skin is warm and dry.     Capillary Refill: Capillary refill takes less than 2 seconds.  Neurological:     Mental Status: She is alert and oriented to person, place, and time.  Psychiatric:        Mood and Affect: Mood normal.        Behavior: Behavior normal.        Thought Content: Thought content normal.        Judgment: Judgment normal.    CBC normal except elevated WBC consistent with pregnancy Blood type A+ Hcg: 82,648  Ultrasound report: CLINICAL DATA:  Abdominal pain  EXAM: OBSTETRIC <14 WK [place] AND TRANSVAGINAL OB US  TECHNIQUE: Both  transabdominal and transvaginal ultrasound examinations were performed for complete evaluation of the gestation as well as the maternal uterus, adnexal regions, and pelvic cul-de-sac. Transvaginal technique was performed to assess early pregnancy.  COMPARISON:  None.  FINDINGS: Intrauterine gestational sac: Single  Yolk sac:  Visualized.  Embryo:  Visualized.  Cardiac Activity: Visualized.  Heart Rate: 138 bpm  CRL: 16.9 mm   8 w   0 d                  [place] EDC: 10/12/2020  Subchorionic hemorrhage: There is a small subchorionic hemorrhage along the lower ventral margin of the gestational sac.  Maternal uterus/adnexae: Right ovary measures 7.0 x 2.8 x 2.3 cm and the left ovary measures 3.8 x 2.5 x 2.2 cm. No free fluid or pelvic mass.  IMPRESSION: 1. Single live intrauterine pregnancy as above, estimated age 26 weeks and 0 days. 2. Small subchorionic hemorrhage.   Electronically Signed   By: Randa Ngo M.D.   On: 03/02/2020 18:14  Pt left after ultrasound, results called in to her. Reassurance given and pt instructed to begin  care. She prefers care be at [facility] with Dr. Shawnie Pons whenever possible.  A SIUP at 8wks 0days Abdominal pain  P Discharge from MAU in stable condition with first trimester precautions Pt to establish care at [facility] Warning signs for worsening condition that would warrant emergency follow-up discussed  Bernerd Limbo, CNM 03/04/2020 4:23 AM

## 2020-03-05 LAB — GC/CHLAMYDIA PROBE AMP (~~LOC~~) NOT AT ARMC
Chlamydia: NEGATIVE
Comment: NEGATIVE
Comment: NORMAL
Neisseria Gonorrhea: NEGATIVE

## 2020-03-23 ENCOUNTER — Ambulatory Visit (INDEPENDENT_AMBULATORY_CARE_PROVIDER_SITE_OTHER): Payer: Medicaid Other | Admitting: Family Medicine

## 2020-03-23 ENCOUNTER — Encounter: Payer: Self-pay | Admitting: Family Medicine

## 2020-03-23 ENCOUNTER — Other Ambulatory Visit: Payer: Self-pay

## 2020-03-23 ENCOUNTER — Other Ambulatory Visit (HOSPITAL_COMMUNITY)
Admission: RE | Admit: 2020-03-23 | Discharge: 2020-03-23 | Disposition: A | Discharge status: Home / Self Care | Payer: Medicaid Other | Source: Ambulatory Visit | Attending: Family Medicine | Admitting: Family Medicine

## 2020-03-23 ENCOUNTER — Encounter: Payer: Self-pay | Admitting: *Deleted

## 2020-03-23 VITALS — BP 115/80 | HR 83 | Wt 209.2 lb

## 2020-03-23 DIAGNOSIS — O09529 Supervision of elderly multigravida, unspecified trimester: Secondary | ICD-10-CM | POA: Insufficient documentation

## 2020-03-23 DIAGNOSIS — F3170 Bipolar disorder, currently in remission, most recent episode unspecified: Secondary | ICD-10-CM

## 2020-03-23 DIAGNOSIS — O99330 Smoking (tobacco) complicating pregnancy, unspecified trimester: Secondary | ICD-10-CM

## 2020-03-23 DIAGNOSIS — F1220 Cannabis dependence, uncomplicated: Secondary | ICD-10-CM

## 2020-03-23 DIAGNOSIS — E282 Polycystic ovarian syndrome: Secondary | ICD-10-CM

## 2020-03-23 DIAGNOSIS — O0990 Supervision of high risk pregnancy, unspecified, unspecified trimester: Secondary | ICD-10-CM | POA: Diagnosis not present

## 2020-03-23 DIAGNOSIS — Z720 Tobacco use: Secondary | ICD-10-CM | POA: Insufficient documentation

## 2020-03-23 DIAGNOSIS — O09521 Supervision of elderly multigravida, first trimester: Secondary | ICD-10-CM

## 2020-03-23 NOTE — Progress Notes (Signed)
Subjective:   [name] [name] is a 35 y.o. G1P0000 at [redacted]w[redacted]d by LMP, early ultrasound being seen today for her first obstetrical visit.  Her obstetrical history is significant for advanced maternal age, smoker and PCOS.  Pregnancy history fully reviewed.  Patient reports no complaints.  HISTORY: OB History  Gravida Para Term Preterm AB Living  1 0 0 0 0 0  SAB IAB Ectopic Multiple Live Births  0 0 0 0 0    # Outcome Date GA Lbr Len/2nd Weight Sex Delivery Anes PTL Lv  1 Current            Last pap smear was  05/25/2019 and was normal Past Medical History:  Diagnosis Date  . Anxiety   . Bipolar disorder ([name])   . Boils   . Headache(784.0)   . Mental disorder   . Polycystic ovarian syndrome   . Tumor of ovary    Past Surgical History:  Procedure Laterality Date  . LAPAROSCOPIC UNILATERAL SALPINGECTOMY Left    paratubal cystectomy   Family History  Problem Relation Age of Onset  . Mental illness Mother   . Thyroid disease Mother   . Alcohol abuse Brother   . Breast cancer Paternal Aunt    Social History   Tobacco Use  . Smoking status: Current Every Day Smoker    Packs/day: 0.25    Types: Cigarettes  . Smokeless tobacco: Never Used  Substance Use Topics  . Alcohol use: Not Currently    Comment: rare  . Drug use: Yes    Types: Marijuana    Comment: smokes marijuana twice a week and no cocaine for almost a yearLAST  USE OF COCAINE MARCH,  LAST  MARIJUANA-  YESTERDAY    Allergies  Allergen Reactions  . Ibuprofen Nausea And Vomiting    Stomach upset   Current Outpatient Medications on File Prior to Visit  Medication Sig Dispense Refill  . Prenatal Vit-Fe Fumarate-FA (MULTIVITAMIN-PRENATAL) 27-0.8 MG TABS tablet Take 1 tablet by mouth daily at 12 noon.    . [DISCONTINUED] benztropine (COGENTIN) 0.5 MG tablet Take 1 tablet (0.5 mg total) by mouth at bedtime. (Patient taking differently: Take 0.5 mg by mouth daily. ) 30 tablet 0  . [DISCONTINUED]  lamoTRIgine (LAMICTAL) 25 MG tablet Take 1 tablet (25 mg total) by mouth daily. For mood stability 30 tablet 0  . [DISCONTINUED] promethazine (PHENERGAN) 25 MG tablet Take 1 tablet (25 mg total) by mouth every 6 (six) hours as needed for nausea or vomiting. 30 tablet 1   No current facility-administered medications on file prior to visit.     Exam   Vitals:   03/23/20 0939  BP: 115/80  Pulse: 83  Weight: 209 lb 3.2 oz (94.9 kg)   Fetal Heart Rate (bpm): 155  System: General: well-developed, well-nourished female in no acute distress   Skin: normal coloration and turgor, no rashes   Neurologic: oriented, normal, negative, normal mood   Extremities: normal strength, tone, and muscle mass, ROM of all joints is normal   HEENT PERRLA, extraocular movement intact and sclera clear, anicteric   Mouth/Teeth mucous membranes moist, pharynx normal without lesions and dental hygiene good   Neck supple and no masses   Cardiovascular: regular rate and rhythm   Respiratory:  no respiratory distress, normal breath sounds   Abdomen: soft, non-tender; bowel sounds normal; no masses,  no organomegaly     Assessment:   Pregnancy: G1P0000 Patient Active Problem List  Diagnosis Date Noted  . Supervision of high risk pregnancy, antepartum 03/23/2020  . AMA (advanced maternal age) multigravida 35+ 03/23/2020  . Bipolar disorder ([name]) 11/12/2015  . Bipolar disorder, curr episode mixed, severe, with psychotic features ([name]) 08/02/2014  . Cannabis use disorder, severe, dependence ([name]) 08/02/2014  . Recurrent UTI 05/15/2013  . PCOS (polycystic ovarian syndrome) 02/26/2012  . Hypomenorrhea/oligomenorrhea 02/26/2012     Plan:  1. Supervision of high risk pregnancy, antepartum - [facility] AMB BABYSCRIPTS SCHEDULE OPTIMIZATION - Culture, OB Urine - Genetic Screening - CBC/D/Plt+RPR+Rh+ABO+Rub Ab... - Cervicovaginal ancillary only( [name]) - [place] MFM OB COMP + 14 WK; Future  2. PCOS (polycystic  ovarian syndrome) - Hemoglobin A1c  3. Multigravida of advanced maternal age in first trimester NIPT  4. Bipolar affective disorder in remission ([name]) On no meds at present and x 2 years, feels ok.  5. Cannabis use disorder, severe, dependence ([name]) Down to 2x/week, + tobacco use--ongoing cessation discussed.   Initial labs drawn. Continue prenatal vitamins. Genetic Screening discussed, NIPS: ordered. Ultrasound discussed; fetal anatomic survey: ordered. Problem list reviewed and updated. The nature of [place] with multiple MDs and other Advanced Practice Providers was explained to patient; also emphasized that residents, students are part of our team. Routine obstetric precautions reviewed. Return in about 4 weeks (around 04/20/2020) for virtual, needs U/S.

## 2020-03-23 NOTE — Patient Instructions (Signed)

## 2020-03-26 LAB — HCV INTERPRETATION

## 2020-03-26 LAB — CERVICOVAGINAL ANCILLARY ONLY
Bacterial Vaginitis (gardnerella): POSITIVE — AB
Candida Glabrata: NEGATIVE
Candida Vaginitis: NEGATIVE
Chlamydia: NEGATIVE
Comment: NEGATIVE
Comment: NEGATIVE
Comment: NEGATIVE
Comment: NEGATIVE
Comment: NEGATIVE
Comment: NORMAL
Neisseria Gonorrhea: NEGATIVE
Trichomonas: NEGATIVE

## 2020-03-26 LAB — URINE CULTURE, OB REFLEX

## 2020-03-26 LAB — CBC/D/PLT+RPR+RH+ABO+RUB AB...
Antibody Screen: NEGATIVE
Basophils Absolute: 0 10*3/uL (ref 0.0–0.2)
Basos: 0 %
EOS (ABSOLUTE): 0.1 10*3/uL (ref 0.0–0.4)
Eos: 1 %
HCV Ab: <0.1 s/co ratio (ref 0.0–0.9)
HIV Screen 4th Generation wRfx: NONREACTIVE
Hematocrit: 37.2 % (ref 34.0–46.6)
Hemoglobin: 13.1 g/dL (ref 11.1–15.9)
Hepatitis B Surface Ag: NEGATIVE
Immature Grans (Abs): 0.1 10*3/uL (ref 0.0–0.1)
Immature Granulocytes: 1 %
Lymphocytes Absolute: 2.1 10*3/uL (ref 0.7–3.1)
Lymphs: 16 %
MCH: 33.4 pg — ABNORMAL HIGH (ref 26.6–33.0)
MCHC: 35.2 g/dL (ref 31.5–35.7)
MCV: 95 fL (ref 79–97)
Monocytes Absolute: 0.9 10*3/uL (ref 0.1–0.9)
Monocytes: 7 %
Neutrophils Absolute: 9.6 10*3/uL — ABNORMAL HIGH (ref 1.4–7.0)
Neutrophils: 75 %
Platelets: 272 10*3/uL (ref 150–450)
RBC: 3.92 x10E6/uL (ref 3.77–5.28)
RDW: 12.3 % (ref 11.7–15.4)
RPR Ser Ql: NONREACTIVE
Rh Factor: POSITIVE
Rubella Antibodies, IGG: 1.21 index (ref >0.99)
WBC: 12.9 10*3/uL — ABNORMAL HIGH (ref 3.4–10.8)

## 2020-03-26 LAB — CULTURE, OB URINE

## 2020-03-26 LAB — HEMOGLOBIN A1C
Est. average glucose Bld gHb Est-mCnc: 97 mg/dL
Hgb A1c MFr Bld: 5 % (ref 4.8–5.6)

## 2020-03-27 MED ORDER — CLINDAMYCIN PHOSPHATE 2 % VA CREA: 1.0000 | TOPICAL_CREAM | Freq: Every day | VAGINAL | 0 refills | Status: DC

## 2020-03-27 NOTE — Addendum Note (Signed)
Addended by: Donnamae Jude on: 03/27/2020 10:44 AM   Modules accepted: Orders

## 2020-04-02 ENCOUNTER — Other Ambulatory Visit: Payer: Self-pay | Admitting: *Deleted

## 2020-04-02 DIAGNOSIS — N760 Acute vaginitis: Secondary | ICD-10-CM

## 2020-04-02 DIAGNOSIS — B9689 Other specified bacterial agents as the cause of diseases classified elsewhere: Secondary | ICD-10-CM

## 2020-04-02 MED ORDER — METRONIDAZOLE 0.75 % VA GEL: 1.0000 | Freq: Every day | VAGINAL | 0 refills | Status: AC

## 2020-04-02 NOTE — Progress Notes (Signed)
Called pt re: Rx for Clindamycin requires prior authorization. I advised that I am working on the pre-auth and it may not be able to be completed until tomorrow as there is confusion about which carrier she has for Medicaid. Pt stated that she would rather have a different prescription so that she can begin treatment today. Rx for Metrogel received from Dr. Kennon Rounds and was e-prescribed. I called [place] and verified that it would be covered by Medicaid. Pt will have $3 co-pay as usual. I instructed the pharmacist to discontinue the Clindamycin Rx.

## 2020-04-19 ENCOUNTER — Encounter: Payer: Self-pay | Admitting: *Deleted

## 2020-04-19 ENCOUNTER — Other Ambulatory Visit: Payer: Self-pay

## 2020-04-19 ENCOUNTER — Telehealth (INDEPENDENT_AMBULATORY_CARE_PROVIDER_SITE_OTHER): Payer: Medicaid Other | Admitting: Family Medicine

## 2020-04-19 ENCOUNTER — Encounter: Payer: Self-pay | Admitting: Family Medicine

## 2020-04-19 ENCOUNTER — Telehealth: Payer: Self-pay | Admitting: Lactation Services

## 2020-04-19 VITALS — BP 125/72 | HR 69

## 2020-04-19 DIAGNOSIS — Z3A14 14 weeks gestation of pregnancy: Secondary | ICD-10-CM

## 2020-04-19 DIAGNOSIS — O09522 Supervision of elderly multigravida, second trimester: Secondary | ICD-10-CM

## 2020-04-19 DIAGNOSIS — O99332 Smoking (tobacco) complicating pregnancy, second trimester: Secondary | ICD-10-CM | POA: Diagnosis not present

## 2020-04-19 DIAGNOSIS — F17200 Nicotine dependence, unspecified, uncomplicated: Secondary | ICD-10-CM | POA: Diagnosis not present

## 2020-04-19 DIAGNOSIS — O99330 Smoking (tobacco) complicating pregnancy, unspecified trimester: Secondary | ICD-10-CM

## 2020-04-19 DIAGNOSIS — O0990 Supervision of high risk pregnancy, unspecified, unspecified trimester: Secondary | ICD-10-CM

## 2020-04-19 NOTE — Progress Notes (Signed)
TELEHEALTH OBSTETRICS VISIT ENCOUNTER NOTE  Provider location: [facility] for [employer] [facility] [facility]   I connected with [place] on 04/19/20 at  8:55 AM EST by telephone at home and verified that I am speaking with the correct person using two identifiers. Of note, unable to do video encounter due to technical difficulties.    I discussed the limitations, risks, security and privacy concerns of performing an evaluation and management service by telephone and the availability of in person appointments. I also discussed with the patient that there may be a patient responsible charge related to this service. The patient expressed understanding and agreed to proceed.  Subjective:  [name] [name] is a 35 y.o. G1P0000 at [redacted]w[redacted]d being followed for ongoing prenatal care.  She is currently monitored for the following issues for this high-risk pregnancy and has PCOS (polycystic ovarian syndrome); Hypomenorrhea/oligomenorrhea; Recurrent UTI; Bipolar disorder, curr episode mixed, severe, with psychotic features ([place]); Cannabis use disorder, severe, dependence ([place]); Bipolar disorder ([place]); Supervision of high risk pregnancy, antepartum; AMA (advanced maternal age) multigravida 6+; Tobacco use affecting pregnancy, antepartum; and Smoker on their problem list.  Patient reports no complaints. Reports fetal movement. Denies any contractions, bleeding or leaking of fluid.  Mother [name] [name] joined called.   The following portions of the patient's history were reviewed and updated as appropriate: allergies, current medications, past family history, past medical history, past social history, past surgical history and problem list.   Objective:  Blood pressure 125/72, pulse 69, last menstrual period 01/06/2020. General:  Alert, oriented and cooperative.   Mental Status: Normal mood and affect perceived. Normal judgment and thought content.  Rest of physical exam  deferred due to type of encounter  Assessment and Plan:  Pregnancy: G1P0000 at [redacted]w[redacted]d  .1. Supervision of high risk pregnancy, antepartum Reports having NIPT- low risk, female.  Reviewed paper copy in office, not yet scanned to Epic.  Discussed AFP for spina bifida  Reviewed desire for in person visits Discussed upcoming 3/21 [place]  2. Tobacco use affecting pregnancy, antepartum Continues to smokes 1 cpd max Used motivational interviewing to discuss barriers to tobacco cessation. Spent 3-10 minutes in direct counseling about ways to reduce use with the goal of eventual cessation. Reviewed health concerns for pregnancy and infant related to tobacco use.  Discussed gradual reduction and replacement of cigs with other activities.  Patient stated goal: remain low use  3. Multigravida of advanced maternal age in second trimester Low risk NIP   Preterm labor symptoms and general obstetric precautions including but not limited to vaginal bleeding, contractions, leaking of fluid and fetal movement were reviewed in detail with the patient.  I discussed the assessment and treatment plan with the patient. The patient was provided an opportunity to ask questions and all were answered. The patient agreed with the plan and demonstrated an understanding of the instructions. The patient was advised to call back or seek an in-person office evaluation/go to MAU at [facility] for any urgent or concerning symptoms. Please refer to After Visit Summary for other counseling recommendations.   I provided 15 minutes of non-face-to-face time during this encounter.  Return in about 4 weeks (around 05/17/2020) for Routine prenatal care, in person, MD or APP.  Future Appointments  Date Time Provider [place]  05/21/2020  8:30 AM [facility] US3 [facility] [place], [place] for [facility], [place]

## 2020-04-19 NOTE — Progress Notes (Signed)
Unable to connect with patient in virtual room. Called patient and verified identity with 2 identifiers. She gave consent in telephone visit. Patient stated that she was unable to connect with [place] virtually and that she received "error" message. Patient was educated on phone updates and how it was reccommended to update phone for meetings. She also mentioned that she would prefer "in person" visits only.  [name] [name], CMA

## 2020-04-19 NOTE — Telephone Encounter (Signed)
Patient called and LM on Nurse voicemail that she was not able to connect for My Chart visit this morning and wanted to come in for her appt.   Called patient and she reports visit was completed over the phone and she has gotten her next visit scheduled. She has no other questions or concerns.

## 2020-05-08 ENCOUNTER — Telehealth: Payer: Self-pay | Admitting: Lactation Services

## 2020-05-08 NOTE — Telephone Encounter (Signed)
Called patient with results of [facility] Screening results.   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 Reviewed we recommend she call the [place] at [phone] to schedule a telephone genetic counseling session and also discuss partner testing.   Patient was tearful, reviewed with her that this does not mean there is anything wrong with her infant and that these are precautionary tests. Enc her to call with any questions or concerns as needed.   Reviewed with patient that we do have a genetic counselor in house that we can get her scheduled with if she would like. Patient will let us know.

## 2020-05-18 ENCOUNTER — Inpatient Hospital Stay (HOSPITAL_COMMUNITY)
Admission: AD | Admit: 2020-05-18 | Discharge: 2020-05-18 | Disposition: A | Discharge status: Home / Self Care | Payer: Medicaid Other | Attending: Family Medicine | Admitting: Family Medicine

## 2020-05-18 ENCOUNTER — Encounter (HOSPITAL_COMMUNITY): Payer: Self-pay | Admitting: Family Medicine

## 2020-05-18 DIAGNOSIS — R102 Pelvic and perineal pain unspecified side: Secondary | ICD-10-CM

## 2020-05-18 DIAGNOSIS — Z3A19 19 weeks gestation of pregnancy: Secondary | ICD-10-CM | POA: Diagnosis not present

## 2020-05-18 DIAGNOSIS — O99342 Other mental disorders complicating pregnancy, second trimester: Secondary | ICD-10-CM | POA: Diagnosis not present

## 2020-05-18 DIAGNOSIS — F1290 Cannabis use, unspecified, uncomplicated: Secondary | ICD-10-CM | POA: Insufficient documentation

## 2020-05-18 DIAGNOSIS — F319 Bipolar disorder, unspecified: Secondary | ICD-10-CM | POA: Diagnosis not present

## 2020-05-18 DIAGNOSIS — O09512 Supervision of elderly primigravida, second trimester: Secondary | ICD-10-CM | POA: Insufficient documentation

## 2020-05-18 DIAGNOSIS — Z3492 Encounter for supervision of normal pregnancy, unspecified, second trimester: Secondary | ICD-10-CM

## 2020-05-18 DIAGNOSIS — O99322 Drug use complicating pregnancy, second trimester: Secondary | ICD-10-CM | POA: Insufficient documentation

## 2020-05-18 DIAGNOSIS — O26892 Other specified pregnancy related conditions, second trimester: Secondary | ICD-10-CM | POA: Insufficient documentation

## 2020-05-18 DIAGNOSIS — O26899 Other specified pregnancy related conditions, unspecified trimester: Secondary | ICD-10-CM

## 2020-05-18 LAB — URINALYSIS, ROUTINE W REFLEX MICROSCOPIC
Bilirubin Urine: NEGATIVE
Glucose, UA: NEGATIVE mg/dL
Hgb urine dipstick: NEGATIVE
Ketones, ur: NEGATIVE mg/dL
Leukocytes,Ua: NEGATIVE
Nitrite: NEGATIVE
Protein, ur: NEGATIVE mg/dL
Specific Gravity, Urine: 1.014 (ref 1.005–1.030)
pH: 6 (ref 5.0–8.0)

## 2020-05-18 LAB — RAPID URINE DRUG SCREEN, HOSP PERFORMED
Amphetamines: NOT DETECTED
Barbiturates: NOT DETECTED
Benzodiazepines: NOT DETECTED
Cocaine: POSITIVE — AB
Opiates: NOT DETECTED
Tetrahydrocannabinol: POSITIVE — AB

## 2020-05-18 LAB — WET PREP, GENITAL
Sperm: NONE SEEN
Trich, Wet Prep: NONE SEEN
Yeast Wet Prep HPF POC: NONE SEEN

## 2020-05-18 MED ORDER — ONDANSETRON 4 MG PO TBDP
8.0000 mg | ORAL_TABLET | Freq: Once | ORAL | Status: AC
  Administered 2020-05-18: 8 mg via ORAL
  Filled 2020-05-18: qty 2

## 2020-05-18 MED ORDER — METRONIDAZOLE 0.75 % VA GEL: 1.0000 | Freq: Two times a day (BID) | VAGINAL | 0 refills | Status: DC

## 2020-05-18 MED ORDER — CYCLOBENZAPRINE HCL 5 MG PO TABS
10.0000 mg | ORAL_TABLET | Freq: Once | ORAL | Status: AC
  Administered 2020-05-18: 10 mg via ORAL
  Filled 2020-05-18: qty 2

## 2020-05-18 NOTE — MAU Provider Note (Signed)
Event Date/Time   First Provider Initiated Contact with Patient 05/18/20 2005     S [name] [name] is a 35 y.o. G1P0000 pregnant female at [redacted]w[redacted]d who presents to MAU today with complaint of lower pelvic pain and nausea. Pt loudly crying and complaining of need to push on admission to MAU. Calmed quickly after cervical exam. Denies any other physical symptoms. Has hx of bipolar with psychotic features and THC use. Friend at bedside for support.   O BP 131/82   Pulse 79   Temp 98.1 F (36.7 C)   Resp 18   LMP 01/06/2020 (Approximate)  Physical Exam Vitals and nursing note reviewed.  Constitutional:      General: She is not in acute distress.    Appearance: She is not ill-appearing.  HENT:     Head: Normocephalic and atraumatic.  Eyes:     Pupils: Pupils are equal, round, and reactive to light.  Cardiovascular:     Rate and Rhythm: Normal rate.  Pulmonary:     Effort: Pulmonary effort is normal.  Abdominal:     Tenderness: There is no abdominal tenderness.  Genitourinary:    Vagina: Vaginal discharge present.     Comments: Dilation: Closed Effacement (%): Thick Cervical Position: Posterior Exam by:: Gaylan Gerold, CNM  Skin:    General: Skin is warm and dry.     Capillary Refill: Capillary refill takes less than 2 seconds.  Neurological:     Mental Status: She is alert and oriented to person, place, and time.    FHR: 152  Results for orders placed or performed during the hospital encounter of 05/18/20 (from the past 24 hour(s))  Urinalysis, Routine w reflex microscopic Urine, Clean Catch     Status: Abnormal   Collection Time: 05/18/20  7:56 PM  Result Value Ref Range   Color, Urine YELLOW YELLOW   APPearance HAZY (A) CLEAR   Specific Gravity, Urine 1.014 1.005 - 1.030   pH 6.0 5.0 - 8.0   Glucose, UA NEGATIVE NEGATIVE mg/dL   Hgb urine dipstick NEGATIVE NEGATIVE   Bilirubin Urine NEGATIVE NEGATIVE   Ketones, ur NEGATIVE NEGATIVE mg/dL   Protein,  ur NEGATIVE NEGATIVE mg/dL   Nitrite NEGATIVE NEGATIVE   Leukocytes,Ua NEGATIVE NEGATIVE  Rapid urine drug screen (hospital performed)     Status: Abnormal   Collection Time: 05/18/20  7:56 PM  Result Value Ref Range   Opiates NONE DETECTED NONE DETECTED   Cocaine POSITIVE (A) NONE DETECTED   Benzodiazepines NONE DETECTED NONE DETECTED   Amphetamines NONE DETECTED NONE DETECTED   Tetrahydrocannabinol POSITIVE (A) NONE DETECTED   Barbiturates NONE DETECTED NONE DETECTED  Wet prep, genital     Status: Abnormal   Collection Time: 05/18/20  7:56 PM   Specimen: Vaginal  Result Value Ref Range   Yeast Wet Prep HPF POC NONE SEEN NONE SEEN   Trich, Wet Prep NONE SEEN NONE SEEN   Clue Cells Wet Prep HPF POC PRESENT (A) NONE SEEN   WBC, Wet Prep HPF POC FEW (A) NONE SEEN   Sperm NONE SEEN    Given flexeril and zofran with complete relief of pain and nausea. Educated on round ligament pain  A Round ligament pain in second trimester Fetal heart tones present  P Discharge from MAU in stable condition with preterm labor precautions Follow up at [facility] as scheduled for ongoing prenatal care  Gabriel Carina, CNM 05/18/2020 9:58 PM

## 2020-05-18 NOTE — MAU Note (Incomplete)
Pt reports lower abd/pelvic pain that stared a few hours ago

## 2020-05-18 NOTE — Discharge Instructions (Signed)
Round Ligament Pain  The round ligament is a cord of muscle and tissue that helps support the uterus. It can become a source of pain during pregnancy if it becomes stretched or twisted as the baby grows. The pain usually begins in the second trimester (13-28 weeks) of pregnancy, and it can come and go until the baby is delivered. It is not a serious problem, and it does not cause harm to the baby. Round ligament pain is usually a short, sharp, and pinching pain, but it can also be a dull, lingering, and aching pain. The pain is felt in the lower side of the abdomen or in the groin. It usually starts deep in the groin and moves up to the outside of the hip area. The pain may occur when you:  Suddenly change position, such as quickly going from a sitting to standing position.  Roll over in bed.  Cough or sneeze.  Do physical activity. Follow these instructions at home:  Watch your condition for any changes.  When the pain starts, relax. Then try any of these methods to help with the pain: ? Sitting down. ? Flexing your knees up to your abdomen. ? Lying on your side with one pillow under your abdomen and another pillow between your legs. ? Sitting in a warm bath for 15-20 minutes or until the pain goes away.  Take over-the-counter and prescription medicines only as told by your health care provider.  Move slowly when you sit down or stand up.  Avoid long walks if they cause pain.  Stop or reduce your physical activities if they cause pain.  Keep all follow-up visits as told by your health care provider. This is important.   Contact a health care provider if:  Your pain does not go away with treatment.  You feel pain in your back that you did not have before.  Your medicine is not helping. Get help right away if:  You have a fever or chills.  You develop uterine contractions.  You have vaginal bleeding.  You have nausea or vomiting.  You have diarrhea.  You have pain  when you urinate. Summary  Round ligament pain is felt in the lower abdomen or groin. It is usually a short, sharp, and pinching pain. It can also be a dull, lingering, and aching pain.  This pain usually begins in the second trimester (13-28 weeks). It occurs because the uterus is stretching with the growing baby, and it is not harmful to the baby.  You may notice the pain when you suddenly change position, when you cough or sneeze, or during physical activity.  Relaxing, flexing your knees to your abdomen, lying on one side, or taking a warm bath may help to get rid of the pain.  Get help from your health care provider if the pain does not go away or if you have vaginal bleeding, nausea, vomiting, diarrhea, or painful urination. This information is not intended to replace advice given to you by your health care provider. Make sure you discuss any questions you have with your health care provider. Document Revised: 08/05/2017 Document Reviewed: 08/05/2017 Elsevier Patient Education  2021 [place].  Round Ligament Massage & Stretches  Massage: Starting at the middle of your pubic bone, trace little circles in a wide U from your pubic bone to your hip bones on both sides.  Then starting just above your pubic bone, press in and down, alternating sides to create a gentle rocking of your  uterus back and forth.  Move your hands up the sides of your belly and back down. Do this 3-5 times upon waking and before bed.  Stretches: Get on hands and knees and alternate arching your back deeply while inhaling, and then rounding your back while exhaling. Modified runners lunge:  - Sit on a chair with half of your bottom on the chair and half off.  - Sit up tall, plant your front foot, and stretch your other foot out behind you.  - Breathe deeply for 5 breaths and then do the other side.

## 2020-05-21 ENCOUNTER — Other Ambulatory Visit: Payer: Self-pay | Admitting: Family Medicine

## 2020-05-21 ENCOUNTER — Ambulatory Visit: Payer: Medicaid Other | Attending: Family Medicine

## 2020-05-21 ENCOUNTER — Other Ambulatory Visit: Payer: Self-pay

## 2020-05-21 ENCOUNTER — Encounter: Payer: Self-pay | Admitting: *Deleted

## 2020-05-21 ENCOUNTER — Encounter: Payer: Self-pay | Admitting: General Practice

## 2020-05-21 ENCOUNTER — Ambulatory Visit: Payer: Medicaid Other | Admitting: *Deleted

## 2020-05-21 ENCOUNTER — Ambulatory Visit (INDEPENDENT_AMBULATORY_CARE_PROVIDER_SITE_OTHER): Payer: Medicaid Other | Admitting: Family Medicine

## 2020-05-21 ENCOUNTER — Other Ambulatory Visit: Payer: Self-pay | Admitting: *Deleted

## 2020-05-21 VITALS — BP 114/65 | HR 62 | Wt 215.0 lb

## 2020-05-21 DIAGNOSIS — O09522 Supervision of elderly multigravida, second trimester: Secondary | ICD-10-CM

## 2020-05-21 DIAGNOSIS — O0990 Supervision of high risk pregnancy, unspecified, unspecified trimester: Secondary | ICD-10-CM

## 2020-05-21 DIAGNOSIS — O09512 Supervision of elderly primigravida, second trimester: Secondary | ICD-10-CM

## 2020-05-21 DIAGNOSIS — O99330 Smoking (tobacco) complicating pregnancy, unspecified trimester: Secondary | ICD-10-CM

## 2020-05-21 LAB — GC/CHLAMYDIA PROBE AMP (~~LOC~~) NOT AT ARMC
Chlamydia: NEGATIVE
Comment: NEGATIVE
Comment: NORMAL
Neisseria Gonorrhea: NEGATIVE

## 2020-05-21 NOTE — Assessment & Plan Note (Deleted)
No meds x 2 years, to see [facility]

## 2020-05-21 NOTE — Progress Notes (Signed)
Subjective:  [name] [name] is a 35 y.o. G1P0000 at [redacted]w[redacted]d being seen today for prenatal care.  Patient reports no complaints.  Contractions: Not present.  Vag. Bleeding: None. Movement: Present. Denies leaking of fluid. Reports round ligament pain.  The following portions of the patient's history were reviewed and updated as appropriate: allergies, current medications, past family history, past medical history, past social history, past surgical history and problem list.   Objective:   Vitals:   05/21/20 1004  BP: 114/65  Pulse: 62  Weight: 215 lb (97.5 kg)    Fetal Status: Fetal Heart Rate (bpm): 140   Movement: Present     General:  Alert, oriented and cooperative. Patient is in no acute distress.  Skin: Skin is warm and dry. No rash noted.   Cardiovascular: Normal heart rate noted  Respiratory: Normal respiratory effort, no problems with respiration noted  Abdomen: Soft, gravid, appropriate for gestational age. Pain/Pressure: Absent     Vaginal: Vag. Bleeding: None.       Cervix: Not evaluated        Extremities: Normal range of motion.  Edema: None  Mental Status: Normal mood and affect. Normal behavior. Normal judgment and thought content.   Urinalysis: Normal  Assessment and Plan:  Pregnancy: G1P0000 at [redacted]w[redacted]d  1. Supervision of high risk pregnancy, antepartum - AFP, Serum, Open Spina Bifida Normal anatomy today--incomplete--> f/u scheduled Continue prenatal care.  2. Multigravida of advanced maternal age in second trimester - Normal NIPT - declined amnio today  3. Tobacco use affecting pregnancy, antepartum - Working on quitting  General obstetric precautions including but not limited to vaginal bleeding, contractions, leaking of fluid and fetal movement were reviewed in detail with the patient. Please refer to After Visit Summary for other counseling recommendations.  Return in 4 weeks (on 06/18/2020).   Rennis Chris, Medical Student   Patient  seen in conjunction with the medical student.  I have taken the history and preformed the exam.  The above note has been edited as necessary.  Donnamae Jude, MD  05/21/2020 12:12 PM

## 2020-05-21 NOTE — Patient Instructions (Signed)

## 2020-05-28 ENCOUNTER — Encounter: Payer: Self-pay | Admitting: *Deleted

## 2020-05-29 LAB — AFP, SERUM, OPEN SPINA BIFIDA
AFP MoM: 1.16
AFP Value: 54 ng/mL
Gest. Age on Collection Date: 19.4 weeks
Maternal Age At EDD: 36.4 yr
OSBR Risk 1 IN: 10000
Test Results:: NEGATIVE
Weight: 215 [lb_av]

## 2020-06-18 ENCOUNTER — Other Ambulatory Visit: Payer: Self-pay | Admitting: *Deleted

## 2020-06-18 ENCOUNTER — Ambulatory Visit: Payer: Medicaid Other | Attending: Obstetrics

## 2020-06-18 ENCOUNTER — Other Ambulatory Visit: Payer: Self-pay | Admitting: Obstetrics

## 2020-06-18 ENCOUNTER — Encounter: Payer: Medicaid Other | Admitting: Family Medicine

## 2020-06-18 ENCOUNTER — Encounter: Payer: Self-pay | Admitting: *Deleted

## 2020-06-18 ENCOUNTER — Ambulatory Visit: Payer: Medicaid Other | Admitting: *Deleted

## 2020-06-18 ENCOUNTER — Other Ambulatory Visit: Payer: Self-pay

## 2020-06-18 DIAGNOSIS — F1910 Other psychoactive substance abuse, uncomplicated: Secondary | ICD-10-CM

## 2020-06-18 DIAGNOSIS — O09512 Supervision of elderly primigravida, second trimester: Secondary | ICD-10-CM

## 2020-06-18 DIAGNOSIS — Z362 Encounter for other antenatal screening follow-up: Secondary | ICD-10-CM | POA: Diagnosis not present

## 2020-06-18 DIAGNOSIS — O99332 Smoking (tobacco) complicating pregnancy, second trimester: Secondary | ICD-10-CM

## 2020-06-18 DIAGNOSIS — O99322 Drug use complicating pregnancy, second trimester: Secondary | ICD-10-CM | POA: Diagnosis present

## 2020-06-18 DIAGNOSIS — Z3686 Encounter for antenatal screening for cervical length: Secondary | ICD-10-CM

## 2020-06-18 DIAGNOSIS — O0990 Supervision of high risk pregnancy, unspecified, unspecified trimester: Secondary | ICD-10-CM

## 2020-06-18 DIAGNOSIS — O99342 Other mental disorders complicating pregnancy, second trimester: Secondary | ICD-10-CM

## 2020-06-18 DIAGNOSIS — O99512 Diseases of the respiratory system complicating pregnancy, second trimester: Secondary | ICD-10-CM | POA: Diagnosis not present

## 2020-06-18 DIAGNOSIS — F1490 Cocaine use, unspecified, uncomplicated: Secondary | ICD-10-CM

## 2020-06-18 DIAGNOSIS — E669 Obesity, unspecified: Secondary | ICD-10-CM

## 2020-06-18 DIAGNOSIS — Z3A23 23 weeks gestation of pregnancy: Secondary | ICD-10-CM

## 2020-06-18 DIAGNOSIS — O321XX0 Maternal care for breech presentation, not applicable or unspecified: Secondary | ICD-10-CM

## 2020-06-18 DIAGNOSIS — F1290 Cannabis use, unspecified, uncomplicated: Secondary | ICD-10-CM

## 2020-06-18 NOTE — Progress Notes (Unsigned)
MFM [facility] follow up   [name] [name] is a G1P0 who is here for follow up growth and to complete the fetal anatomy. She denies s/sx of preterm labor.  Normal interval growth with measurements consistent with dates.  The cervix was dynamic today with shortest measurement being 2.5 cm with fundal pressure.  There was normal amniotic fluid volume and good fetal movement.  [name] [name] was positive for [facility] and cocaine. I discussed with [name] [name] the increased risk for fetal growth restriction, preterm delivery, placental abruption, hypertension and neonatal admission for neonatal abstinence syndrome. I recommended that she discontinue cocaine and THC use, but more damaging cocaine use. She explained that she has not ingested cocaine but she did handle it to earn a few extra dollars.  Given this exposure we have scheduled [name] [name] to return in 8 weeks for growth.  I spent 15 minutes with > 50% in face to face consultation.  All questions answered.  Vikki Ports, MD.

## 2020-06-28 ENCOUNTER — Encounter: Payer: Medicaid Other | Admitting: Obstetrics & Gynecology

## 2020-07-02 ENCOUNTER — Ambulatory Visit (INDEPENDENT_AMBULATORY_CARE_PROVIDER_SITE_OTHER): Payer: Medicaid Other | Admitting: Obstetrics and Gynecology

## 2020-07-02 ENCOUNTER — Other Ambulatory Visit: Payer: Self-pay

## 2020-07-02 VITALS — BP 108/74 | HR 82 | Wt 224.1 lb

## 2020-07-02 DIAGNOSIS — F3164 Bipolar disorder, current episode mixed, severe, with psychotic features: Secondary | ICD-10-CM

## 2020-07-02 DIAGNOSIS — O99330 Smoking (tobacco) complicating pregnancy, unspecified trimester: Secondary | ICD-10-CM

## 2020-07-02 DIAGNOSIS — M25352 Other instability, left hip: Secondary | ICD-10-CM | POA: Insufficient documentation

## 2020-07-02 DIAGNOSIS — O0990 Supervision of high risk pregnancy, unspecified, unspecified trimester: Secondary | ICD-10-CM

## 2020-07-02 DIAGNOSIS — O09522 Supervision of elderly multigravida, second trimester: Secondary | ICD-10-CM

## 2020-07-02 DIAGNOSIS — F1220 Cannabis dependence, uncomplicated: Secondary | ICD-10-CM

## 2020-07-02 DIAGNOSIS — Z3A25 25 weeks gestation of pregnancy: Secondary | ICD-10-CM | POA: Insufficient documentation

## 2020-07-02 NOTE — Patient Instructions (Signed)
Oral Glucose Tolerance Test During Pregnancy Why am I having this test? The oral glucose tolerance test (OGTT) is done to check how your body processes blood sugar (glucose). This is one of several tests used to diagnose diabetes that develops during pregnancy (gestational diabetes mellitus). Gestational diabetes is a short-term form of diabetes that some women develop while they are pregnant. It usually occurs during the second trimester of pregnancy and goes away after delivery. Testing, or screening, for gestational diabetes usually occurs at weeks 24-28 of pregnancy. You may have the OGTT test after having a 1-hour glucose screening test if the results from that test indicate that you may have gestational diabetes. This test may also be needed if:  You have a history of gestational diabetes.  There is a history of giving birth to very large babies or of losing pregnancies (having stillbirths).  You have signs and symptoms of diabetes, such as: ? Changes in your eyesight. ? Tingling or numbness in your hands or feet. ? Changes in hunger, thirst, and urination, and these are not explained by your pregnancy. What is being tested? This test measures the amount of glucose in your blood at different times during a period of 3 hours. This shows how well your body can process glucose. What kind of sample is taken? Blood samples are required for this test. They are usually collected by inserting a needle into a blood vessel.   How do I prepare for this test?  For 3 days before your test, eat normally. Have plenty of carbohydrate-rich foods.  Follow instructions from your health care provider about: ? Eating or drinking restrictions on the day of the test. You may be asked not to eat or drink anything other than water (to fast) starting 8-10 hours before the test. ? Changing or stopping your regular medicines. Some medicines may interfere with this test. Tell a health care provider about:  All  medicines you are taking, including vitamins, herbs, eye drops, creams, and over-the-counter medicines.  Any blood disorders you have.  Any surgeries you have had.  Any medical conditions you have. What happens during the test? First, your blood glucose will be measured. This is referred to as your fasting blood glucose because you fasted before the test. Then, you will drink a glucose solution that contains a certain amount of glucose. Your blood glucose will be measured again 1, 2, and 3 hours after you drink the solution. This test takes about 3 hours to complete. You will need to stay at the testing location during this time. During the testing period:  Do not eat or drink anything other than the glucose solution.  Do not exercise.  Do not use any products that contain nicotine or tobacco, such as cigarettes, e-cigarettes, and chewing tobacco. These can affect your test results. If you need help quitting, ask your health care provider. The testing procedure may vary among health care providers and hospitals. How are the results reported? Your results will be reported as milligrams of glucose per deciliter of blood (mg/dL) or millimoles per liter (mmol/L). There is more than one source for screening and diagnosis reference values used to diagnose gestational diabetes. Your health care provider will compare your results to normal values that were established after testing a large group of people (reference values). Reference values may vary among labs and hospitals. For this test ([place]arpenter-Coustan), reference values are:  Fasting: 95 mg/dL (5.3 mmol/L).  1 hour: 180 mg/dL (10.0 mmol/L).  2 hour:   155 mg/dL (8.6 mmol/L).  3 hour: 140 mg/dL (7.8 mmol/L). What do the results mean? Results below the reference values are considered normal. If two or more of your blood glucose levels are at or above the reference values, you may be diagnosed with gestational diabetes. If only one level is  high, your health care provider may suggest repeat testing or other tests to confirm a diagnosis. Talk with your health care provider about what your results mean. Questions to ask your health care provider Ask your health care provider, or the department that is doing the test:  When will my results be ready?  How will I get my results?  What are my treatment options?  What other tests do I need?  What are my next steps? Summary  The oral glucose tolerance test (OGTT) is one of several tests used to diagnose diabetes that develops during pregnancy (gestational diabetes mellitus). Gestational diabetes is a short-term form of diabetes that some women develop while they are pregnant.  You may have the OGTT test after having a 1-hour glucose screening test if the results from that test show that you may have gestational diabetes. You may also have this test if you have any symptoms or risk factors for this type of diabetes.  Talk with your health care provider about what your results mean. This information is not intended to replace advice given to you by your health care provider. Make sure you discuss any questions you have with your health care provider. Document Revised: 07/28/2019 Document Reviewed: 07/28/2019 Elsevier Patient Education  2021 Elsevier Inc.  
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
## 2020-07-16 ENCOUNTER — Ambulatory Visit: Payer: Medicaid Other | Attending: Student | Admitting: Physical Therapy

## 2020-07-16 ENCOUNTER — Other Ambulatory Visit: Payer: Self-pay

## 2020-07-16 ENCOUNTER — Encounter: Payer: Self-pay | Admitting: Physical Therapy

## 2020-07-16 DIAGNOSIS — M6281 Muscle weakness (generalized): Secondary | ICD-10-CM | POA: Diagnosis not present

## 2020-07-16 DIAGNOSIS — M25552 Pain in left hip: Secondary | ICD-10-CM | POA: Insufficient documentation
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
## 2020-07-16 NOTE — Addendum Note (Signed)
Addended by: Vesta Mixer C on: 07/16/2020 12:56 PM   Modules accepted: Orders

## 2020-07-23 ENCOUNTER — Encounter: Payer: Medicaid Other | Admitting: Obstetrics and Gynecology

## 2020-07-23 ENCOUNTER — Other Ambulatory Visit: Payer: Medicaid Other

## 2020-07-24 ENCOUNTER — Other Ambulatory Visit: Payer: Self-pay

## 2020-07-24 ENCOUNTER — Encounter: Payer: Self-pay | Admitting: Physical Therapy

## 2020-07-24 ENCOUNTER — Ambulatory Visit: Payer: Medicaid Other | Admitting: Physical Therapy

## 2020-07-24 DIAGNOSIS — M25552 Pain in left hip: Secondary | ICD-10-CM

## 2020-07-24 DIAGNOSIS — M6281 Muscle weakness (generalized): Secondary | ICD-10-CM | POA: Diagnosis not present

## 2020-07-24 NOTE — Therapy (Signed)
[facility] [address], [place], [place], [address] Phone: [phone]   Fax:  [phone]  Physical Therapy Treatment  Patient Details  Name: [name] [name] MRN: [id number] Date of Birth: [birth year] Referring Provider (PT): Griffin Basil, MD   Encounter Date: 07/24/2020   PT End of Session - 07/24/20 1053    Visit Number 2    Number of Visits 4    Date for PT Re-Evaluation 10/08/20    Authorization Type Medicaid    Authorization Time Period 3 visits between 07/24/20 - 08/13/20    Authorization - Visit Number 2    Authorization - Number of Visits 4    PT Start Time 1055    PT Stop Time 1142    PT Time Calculation (min) 47 min    Activity Tolerance Patient limited by pain    Behavior During Therapy Anxious           Past Medical History:  Diagnosis Date  . Anxiety   . Bipolar disorder ([place])   . Boils   . Headache(784.0)   . Mental disorder   . Polycystic ovarian syndrome   . Tumor of ovary     Past Surgical History:  Procedure Laterality Date  . LAPAROSCOPIC UNILATERAL SALPINGECTOMY Left    paratubal cystectomy    There were no vitals filed for this visit.   Subjective Assessment - 07/24/20 1052    Subjective I didn't go to my OB appointment yesterday b/c I rescheduled so I could be with a female.  Early June is my next appointment.    Pertinent History [redacted]w[redacted]d pregnant at evaluation    Diagnostic tests xrays 2021 lumbar - negative    Patient Stated Goals assess pelvis to determine best mode for delivery    Currently in Pain? No/denies    Pain Type Chronic pain;Acute pain    Aggravating Factors  position and movement dependent                             [facility] Adult PT Treatment/Exercise - 07/24/20 0001      Self-Care   Self-Care Other Self-Care Comments    Other Self-Care Comments  use of pelvic model to educate Pt on pelvic floor muscles and how they relate to  childbirth, benefits of learning and using diaphragmatic breathing to open pelvic muscles/vagina for childbirth      Exercises   Exercises Lumbar;Knee/Hip      Lumbar Exercises: Standing   Other Standing Lumbar Exercises trunk draping UE hug big blue ball on mat table, rocking fwd/bwd/side-to-side x 2'      Lumbar Exercises: Seated   Other Seated Lumbar Exercises pelvic ROM on large blue ball x 10 each: tail wag lat pelvic tilts, tucks/lifts, circles each way      Lumbar Exercises: Quadruped   Other Quadruped Lumbar Exercises quadruped rocking x 10 with PF and SITS bones opening TC/VC    Other Quadruped Lumbar Exercises quadruped on elbows diaphragmatic breathing x 3'      Knee/Hip Exercises: Seated   Ball Squeeze 10x3 sec holds   Pt had vaginal pain with this so not added to HEP   Clamshell with Marga Hoots   20 reps                 PT Education - 07/24/20 1139    Education Details Access Code: [employer]) Educated  Patient    Methods Explanation;Demonstration;Handout;Verbal cues;Tactile cues    Comprehension Verbalized understanding;Returned demonstration               PT Long Term Goals - 07/24/20 1147      PT LONG TERM GOAL #1   Title Pt will be educated on and able to achieve various birthing positions to reduce pain for potential vaginal delivery option    Status On-going                 Plan - 07/24/20 1104    Clinical Impression Statement Pt rescheduled her OB appointment from yesterday to early June in order to see a female.  She arrived without pain having avoided positions and movements that aggravate hip.  PT educated Pt using pelvic model on pelvic floor, on diaphragmatic breathing, and explored positions for options for vaginal delivery that are more comfortable for Pt.  She has signif pain in supine especially with hip movement of flexion/abduction/ER required for vaginal delivery so avoiding this position.  She is able to achieve  quadruped on elbows without pain so worked here on learning how to open PF and perform barrel breathing.  Pt also able to perform pelvic ROM on ball and standing draping over ball on table today with good tolerance which PT reiterated will be good options for her for positioning while laboring and for delivery prep.  Pt had to take a phone call so treatment time was limited today.    Personal Factors and Comorbidities Behavior Pattern;Time since onset of injury/illness/exacerbation    Examination-Activity Limitations Transfers;Bed Mobility    Examination-Participation Restrictions Interpersonal Relationship;Cleaning;Community Activity;Shop    Stability/Clinical Decision Making Stable/Uncomplicated    Rehab Potential Fair    PT Frequency 2x / week    PT Duration 8 weeks    PT Treatment/Interventions ADLs/Self Care Home Management;Therapeutic exercise;Functional mobility training;Therapeutic activities;Patient/family education;Cryotherapy;Moist Heat;Manual techniques;Passive range of motion    PT Next Visit Plan review HEP, work on sit to stand from elevated surface with equal WB into Lt LE, continue exploring options for pelvic relaxation, breathing, Lt hip stabilization, avoid supine positioning and large Lt hip movements (no flexion/abd/ER combo)    PT Home Exercise Plan birthing positions handout    Consulted and Agree with Plan of Care Patient           Patient will benefit from skilled therapeutic intervention in order to improve the following deficits and impairments:  Abnormal gait,Pain,Decreased strength,Improper body mechanics,Decreased activity tolerance,Decreased range of motion  Visit Diagnosis: Muscle weakness (generalized)  Pain in left hip     Problem List Patient Active Problem List   Diagnosis Date Noted  . [redacted] weeks gestation of pregnancy 07/02/2020  . Hip instability, left 07/02/2020  . Supervision of high risk pregnancy, antepartum 03/23/2020  . AMA (advanced  maternal age) multigravida 35+ 03/23/2020  . Tobacco use affecting pregnancy, antepartum 03/23/2020  . Smoker 01/18/2019  . Bipolar disorder ([place]) 11/12/2015  . Bipolar disorder, curr episode mixed, severe, with psychotic features ([place]) 08/02/2014  . Cannabis use disorder, severe, dependence ([place]) 08/02/2014  . Recurrent UTI 05/15/2013  . PCOS (polycystic ovarian syndrome) 02/26/2012  . Hypomenorrhea/oligomenorrhea 02/26/2012    Baruch Merl, PT 07/24/20 11:47 AM   [facility] [address], [place], [place], [address] Phone: [phone]   Fax:  [phone]  Name: Darthy Manganelli MRN: [id number] Date of Birth: April 26, 1985

## 2020-07-24 NOTE — Patient Instructions (Signed)
Access Code: [email or web address] URL: [email or web address] Date: 07/24/2020 Prepared by: Venetia Night Tanzie Rothschild  Exercises Quadruped Diaphragmatic Breathing - 1 x daily - 7 x weekly - 2 sets - 10 reps Quadruped Rocking Slow - 1 x daily - 7 x weekly - 2 sets - 10 reps Seated Swiss Ball Pelvic Circles - 1 x daily - 7 x weekly - 1 sets - 10 reps Seated Lateral Pelvic Tilt on Swiss Ball - 1 x daily - 7 x weekly - 1 sets - 20 reps Pelvic Tilt on Swiss Ball - 1 x daily - 7 x weekly - 1 sets - 10 reps Seated Hip Abduction with Resistance - 1 x daily - 7 x weekly - 2 sets - 10 reps

## 2020-07-27 ENCOUNTER — Other Ambulatory Visit: Payer: Self-pay

## 2020-07-27 ENCOUNTER — Encounter (HOSPITAL_COMMUNITY): Payer: Self-pay | Admitting: Obstetrics & Gynecology

## 2020-07-27 ENCOUNTER — Inpatient Hospital Stay (HOSPITAL_COMMUNITY)
Admission: AD | Admit: 2020-07-27 | Discharge: 2020-07-27 | Disposition: A | Discharge status: Home / Self Care | Payer: Medicaid Other | Attending: Obstetrics & Gynecology | Admitting: Obstetrics & Gynecology

## 2020-07-27 DIAGNOSIS — O99283 Endocrine, nutritional and metabolic diseases complicating pregnancy, third trimester: Secondary | ICD-10-CM | POA: Insufficient documentation

## 2020-07-27 DIAGNOSIS — O0990 Supervision of high risk pregnancy, unspecified, unspecified trimester: Secondary | ICD-10-CM

## 2020-07-27 DIAGNOSIS — E282 Polycystic ovarian syndrome: Secondary | ICD-10-CM | POA: Insufficient documentation

## 2020-07-27 DIAGNOSIS — O99343 Other mental disorders complicating pregnancy, third trimester: Secondary | ICD-10-CM | POA: Diagnosis not present

## 2020-07-27 DIAGNOSIS — O23593 Infection of other part of genital tract in pregnancy, third trimester: Secondary | ICD-10-CM

## 2020-07-27 DIAGNOSIS — Z3A29 29 weeks gestation of pregnancy: Secondary | ICD-10-CM | POA: Insufficient documentation

## 2020-07-27 DIAGNOSIS — F17210 Nicotine dependence, cigarettes, uncomplicated: Secondary | ICD-10-CM | POA: Insufficient documentation

## 2020-07-27 DIAGNOSIS — O09523 Supervision of elderly multigravida, third trimester: Secondary | ICD-10-CM | POA: Diagnosis not present

## 2020-07-27 DIAGNOSIS — O98813 Other maternal infectious and parasitic diseases complicating pregnancy, third trimester: Secondary | ICD-10-CM | POA: Diagnosis not present

## 2020-07-27 DIAGNOSIS — O99333 Smoking (tobacco) complicating pregnancy, third trimester: Secondary | ICD-10-CM | POA: Diagnosis not present

## 2020-07-27 DIAGNOSIS — B3731 Acute candidiasis of vulva and vagina: Secondary | ICD-10-CM

## 2020-07-27 DIAGNOSIS — F319 Bipolar disorder, unspecified: Secondary | ICD-10-CM | POA: Insufficient documentation

## 2020-07-27 DIAGNOSIS — B373 Candidiasis of vulva and vagina: Secondary | ICD-10-CM | POA: Diagnosis not present

## 2020-07-27 DIAGNOSIS — Z3689 Encounter for other specified antenatal screening: Secondary | ICD-10-CM

## 2020-07-27 LAB — WET PREP, GENITAL
Clue Cells Wet Prep HPF POC: NONE SEEN
Sperm: NONE SEEN
Trich, Wet Prep: NONE SEEN
Yeast Wet Prep HPF POC: NONE SEEN

## 2020-07-27 LAB — URINALYSIS, ROUTINE W REFLEX MICROSCOPIC
Bilirubin Urine: NEGATIVE
Glucose, UA: NEGATIVE mg/dL
Hgb urine dipstick: NEGATIVE
Ketones, ur: NEGATIVE mg/dL
Leukocytes,Ua: NEGATIVE
Nitrite: NEGATIVE
Protein, ur: NEGATIVE mg/dL
Specific Gravity, Urine: 1.017 (ref 1.005–1.030)
pH: 6 (ref 5.0–8.0)

## 2020-07-27 LAB — GC/CHLAMYDIA PROBE AMP (~~LOC~~) NOT AT ARMC
Chlamydia: NEGATIVE
Comment: NEGATIVE
Comment: NORMAL
Neisseria Gonorrhea: NEGATIVE

## 2020-07-27 MED ORDER — TERCONAZOLE 0.4 % VA CREA: 1.0000 | TOPICAL_CREAM | Freq: Every day | VAGINAL | 0 refills | Status: AC

## 2020-07-27 MED ORDER — NYSTATIN-TRIAMCINOLONE 100000-0.1 UNIT/GM-% EX OINT
TOPICAL_OINTMENT | Freq: Two times a day (BID) | CUTANEOUS | Status: DC
  Administered 2020-07-27: 1 via TOPICAL
  Filled 2020-07-27: qty 15

## 2020-07-27 NOTE — Discharge Instructions (Signed)
Vaginal Yeast Infection, Adult  Vaginal yeast infection is a condition that causes vaginal discharge as well as soreness, swelling, and redness (inflammation) of the vagina. This is a common condition. Some women get this infection frequently. What are the causes? This condition is caused by a change in the normal balance of the yeast (candida) and bacteria that live in the vagina. This change causes an overgrowth of yeast, which causes the inflammation. What increases the risk? The condition is more likely to develop in women who:  Take antibiotic medicines.  Have diabetes.  Take birth control pills.  Are pregnant.  Douche often.  Have a weak body defense system (immune system).  Have been taking steroid medicines for a long time.  Frequently wear tight clothing. What are the signs or symptoms? Symptoms of this condition include:  White, thick, creamy vaginal discharge.  Swelling, itching, redness, and irritation of the vagina. The lips of the vagina (vulva) may be affected as well.  Pain or a burning feeling while urinating.  Pain during sex. How is this diagnosed? This condition is diagnosed based on:  Your medical history.  A physical exam.  A pelvic exam. Your health care provider will examine a sample of your vaginal discharge under a microscope. Your health care provider may send this sample for testing to confirm the diagnosis. How is this treated? This condition is treated with medicine. Medicines may be over-the-counter or prescription. You may be told to use one or more of the following:  Medicine that is taken by mouth (orally).  Medicine that is applied as a cream (topically).  Medicine that is inserted directly into the vagina (suppository). Follow these instructions at home: Lifestyle  Do not have sex until your health care provider approves. Tell your sex partner that you have a yeast infection. That person should go to his or her health care  provider and ask if they should also be treated.  Do not wear tight clothes, such as pantyhose or tight pants.  Wear breathable cotton underwear. General instructions  Take or apply over-the-counter and prescription medicines only as told by your health care provider.  Eat more yogurt. This may help to keep your yeast infection from returning.  Do not use tampons until your health care provider approves.  Try taking a sitz bath to help with discomfort. This is a warm water bath that is taken while you are sitting down. The water should only come up to your hips and should cover your buttocks. Do this 3-4 times per day or as told by your health care provider.  Do not douche.  If you have diabetes, keep your blood sugar levels under control.  Keep all follow-up visits as told by your health care provider. This is important.   Contact a health care provider if:  You have a fever.  Your symptoms go away and then return.  Your symptoms do not get better with treatment.  Your symptoms get worse.  You have new symptoms.  You develop blisters in or around your vagina.  You have blood coming from your vagina and it is not your menstrual period.  You develop pain in your abdomen. Summary  Vaginal yeast infection is a condition that causes discharge as well as soreness, swelling, and redness (inflammation) of the vagina.  This condition is treated with medicine. Medicines may be over-the-counter or prescription.  Take or apply over-the-counter and prescription medicines only as told by your health care provider.  Do not   douche. Do not have sex or use tampons until your health care provider approves.  Contact a health care provider if your symptoms do not get better with treatment or your symptoms go away and then return. This information is not intended to replace advice given to you by your health care provider. Make sure you discuss any questions you have with your health care  provider. Document Revised: 09/17/2018 Document Reviewed: 07/06/2017 Elsevier Patient Education  2021 Elsevier Inc.  

## 2020-07-27 NOTE — MAU Provider Note (Signed)
History     CSN: [id number]  Arrival date and time: 07/27/20 3846   None     Chief Complaint  Patient presents with  . Pelvic Pain  . Vaginal Discharge   [name] is a 34 y.o. G1P0000 at [redacted]w[redacted]d who presents to MAU for vaginal discharge and vaginal pain. Patient reports about 2 days ago she had sex with the same partner she has had for the past 10 years and reports since that time she has experienced extreme vaginal and vulvar pain. Patient reports the pain is minimal, and she is able to sleep in MAU through the pain when the area is not being touched/examined, but when the area is touched or examined the pain is extreme. Patient reports the pain is internal and does not extend to her pelvis or lower abdomen. Patient reports she tried using Monistat as she thought she had a yeast infection, but reports it burned. The only thing that made it better was getting in to a warm shower and letting water run over the area. Patient reports she has noticed thick, mucus-like discharge since that time as well. Patient denies any pregnancy-related concerns.  Pt denies VB, LOF, ctx, decreased FM, vaginal discharge/odor/itching. Pt denies N/V, abdominal pain, constipation, diarrhea, or urinary problems. Pt denies fever, chills, fatigue, sweating or changes in appetite. Pt denies SOB or chest pain. Pt denies dizziness, HA, light-headedness, weakness.  Allergies? ibuprofen Prenatal care provider? [facility], next appt 08/03/2020   OB History    Gravida  1   Para  0   Term  0   Preterm  0   AB  0   Living  0     SAB  0   IAB  0   Ectopic  0   Multiple  0   Live Births  0           Past Medical History:  Diagnosis Date  . Anxiety   . Bipolar disorder ([place])   . Boils   . Headache(784.0)   . Mental disorder   . Polycystic ovarian syndrome   . Tumor of ovary     Past Surgical History:  Procedure Laterality Date  . LAPAROSCOPIC UNILATERAL SALPINGECTOMY Left     paratubal cystectomy    Family History  Problem Relation Age of Onset  . Mental illness Mother   . Thyroid disease Mother   . Alcohol abuse Brother   . Breast cancer Paternal Aunt     Social History   Tobacco Use  . Smoking status: Current Every Day Smoker    Packs/day: 0.25    Types: Cigarettes  . Smokeless tobacco: Never Used  Vaping Use  . Vaping Use: Former  . Substances: THC  Substance Use Topics  . Alcohol use: Not Currently    Comment: rare  . Drug use: Yes    Types: Marijuana    Comment: smokes marijuana twice a week and no cocaine for almost a yearLAST  USE OF COCAINE MARCH,  LAST  MARIJUANA-  YESTERDAY     Allergies:  Allergies  Allergen Reactions  . Ibuprofen Nausea And Vomiting    Stomach upset    Medications Prior to Admission  Medication Sig Dispense Refill Last Dose  . Prenatal Vit-Fe Fumarate-FA (MULTIVITAMIN-PRENATAL) 27-0.8 MG TABS tablet Take 1 tablet by mouth daily at 12 noon.   07/26/2020 at Unknown time    Review of Systems  Constitutional: Negative for chills, diaphoresis, fatigue and fever.  Eyes:  Negative for visual disturbance.  Respiratory: Negative for shortness of breath.   Cardiovascular: Negative for chest pain.  Gastrointestinal: Negative for abdominal pain, constipation, diarrhea, nausea and vomiting.  Genitourinary: Positive for vaginal discharge and vaginal pain. Negative for dysuria, flank pain, frequency, pelvic pain, urgency and vaginal bleeding.  Neurological: Negative for dizziness, weakness, light-headedness and headaches.   Physical Exam   Blood pressure 120/64, pulse 91, temperature 98.1 F (36.7 C), resp. rate 18, height 5\' 11"  (1.803 m), weight 104.3 kg, last menstrual period 01/06/2020.  Patient Vitals for the past 24 hrs:  BP Temp Pulse Resp Height Weight  07/27/20 0335 120/64 98.1 F (36.7 C) 91 18 5\' 11"  (1.803 m) 104.3 kg   Physical Exam Vitals and nursing note reviewed. Exam conducted with a chaperone  present.  Constitutional:      General: She is in acute distress (during exam only, pt sleeping when provider entered room).     Appearance: Normal appearance. She is not ill-appearing, toxic-appearing or diaphoretic.  HENT:     Head: Normocephalic and atraumatic.  Pulmonary:     Effort: Pulmonary effort is normal.  Genitourinary:    General: Normal vulva.     Labia:        Right: No rash, tenderness or lesion.        Left: No rash, tenderness or lesion.      Vagina: Vaginal discharge and tenderness present. No bleeding or lesions.     Cervix: Discharge present. No friability, lesion or cervical bleeding.     Comments: GU exam normal, but pt in extreme pain during both external exam and internal exam. Mildly clumped, white, moderate discharge without blood present, small amount of mucus present at otherwise normal appearing cervix. Patient barely able to tolerate speculum exam, bimanual exam unable to be performed. Skin:    General: Skin is warm and dry.  Neurological:     Mental Status: She is alert and oriented to person, place, and time.  Psychiatric:        Mood and Affect: Mood normal.        Behavior: Behavior normal.        Thought Content: Thought content normal.        Judgment: Judgment normal.    Results for orders placed or performed during the hospital encounter of 07/27/20 (from the past 24 hour(s))  Urinalysis, Routine w reflex microscopic Urine, Clean Catch     Status: Abnormal   Collection Time: 07/27/20  3:38 AM  Result Value Ref Range   Color, Urine YELLOW YELLOW   APPearance CLOUDY (A) CLEAR   Specific Gravity, Urine 1.017 1.005 - 1.030   pH 6.0 5.0 - 8.0   Glucose, UA NEGATIVE NEGATIVE mg/dL   Hgb urine dipstick NEGATIVE NEGATIVE   Bilirubin Urine NEGATIVE NEGATIVE   Ketones, ur NEGATIVE NEGATIVE mg/dL   Protein, ur NEGATIVE NEGATIVE mg/dL   Nitrite NEGATIVE NEGATIVE   Leukocytes,Ua NEGATIVE NEGATIVE  Wet prep, genital     Status: Abnormal   Collection  Time: 07/27/20  4:02 AM   Specimen: Vaginal  Result Value Ref Range   Yeast Wet Prep HPF POC NONE SEEN NONE SEEN   Trich, Wet Prep NONE SEEN NONE SEEN   Clue Cells Wet Prep HPF POC NONE SEEN NONE SEEN   WBC, Wet Prep HPF POC MODERATE (A) NONE SEEN   Sperm NONE SEEN    No results found.  MAU Course  Procedures  MDM -grossly normal GU exam with  exception of extreme tenderness on part of patient, pt otherwise able to sleep -VSS -EFM: reactive       -baseline: 120       -variability: moderate       -accels: present, 15x15       -decels: absent       -TOCO: quiet -UA: cloudy, otherwise WNL -WetPrep: WBCs only -GC/CT collected -nystatin-triancinolone ointment given in MAU, pt reports rapid, will send RX terconazole for internal use, and patient to home with hospital dispensed medication tube -pt discharged to home in stable condition  Orders Placed This Encounter  Procedures  . Wet prep, genital    Standing Status:   Standing    Number of Occurrences:   1  . Urinalysis, Routine w reflex microscopic Urine, Clean Catch    Standing Status:   Standing    Number of Occurrences:   1  . Discharge patient    Order Specific Question:   Discharge disposition    Answer:   01-Home or Self Care [1]    Order Specific Question:   Discharge patient date    Answer:   07/27/2020   Meds ordered this encounter  Medications  . nystatin-triamcinolone ointment (MYCOLOG)  . terconazole (TERAZOL 7) 0.4 % vaginal cream    Sig: Place 1 applicator vaginally at bedtime for 7 days.    Dispense:  45 g    Refill:  0    Order Specific Question:   Supervising Provider    Answer:   Verita Schneiders A [[id number]]   Assessment and Plan   1. Vaginal yeast infection   2. Supervision of high risk pregnancy, antepartum   3. [redacted] weeks gestation of pregnancy   4. NST (non-stress test) reactive     Allergies as of 07/27/2020      Reactions   Ibuprofen Nausea And Vomiting   Stomach upset      Medication List     TAKE these medications   multivitamin-prenatal 27-0.8 MG Tabs tablet Take 1 tablet by mouth daily at 12 noon.   terconazole 0.4 % vaginal cream Commonly known as: TERAZOL 7 Place 1 applicator vaginally at bedtime for 7 days.       -return MAU precautions given -pt discharged to home in stable condition  [name] [name] 07/27/2020, 6:23 AM

## 2020-07-27 NOTE — MAU Note (Signed)
Pt reports she has vaginal irritation x 2 days. (ever since she had intercourse ) [employer] but it burned . Had some mucusy discharge come out tonight when she wiped  Clear to white. God fetal movement felt. eports pelvic pain and pressure on and off. Pt thinks they Might be braxton hicks ctx.

## 2020-08-01 ENCOUNTER — Ambulatory Visit: Payer: Medicaid Other | Attending: Student | Admitting: Physical Therapy

## 2020-08-02 ENCOUNTER — Other Ambulatory Visit: Payer: Self-pay | Admitting: *Deleted

## 2020-08-02 DIAGNOSIS — O0990 Supervision of high risk pregnancy, unspecified, unspecified trimester: Secondary | ICD-10-CM

## 2020-08-03 ENCOUNTER — Encounter: Payer: Medicaid Other | Admitting: Certified Nurse Midwife

## 2020-08-03 ENCOUNTER — Other Ambulatory Visit: Payer: Medicaid Other

## 2020-08-06 ENCOUNTER — Encounter: Payer: Self-pay | Admitting: Obstetrics and Gynecology

## 2020-08-06 ENCOUNTER — Telehealth: Payer: Self-pay

## 2020-08-06 ENCOUNTER — Telehealth: Payer: Self-pay | Admitting: Clinical

## 2020-08-06 ENCOUNTER — Other Ambulatory Visit: Payer: Medicaid Other

## 2020-08-06 ENCOUNTER — Ambulatory Visit (INDEPENDENT_AMBULATORY_CARE_PROVIDER_SITE_OTHER): Payer: Medicaid Other | Admitting: Obstetrics and Gynecology

## 2020-08-06 ENCOUNTER — Other Ambulatory Visit: Payer: Self-pay

## 2020-08-06 VITALS — BP 107/66 | HR 80 | Wt 237.5 lb

## 2020-08-06 DIAGNOSIS — F1910 Other psychoactive substance abuse, uncomplicated: Secondary | ICD-10-CM

## 2020-08-06 DIAGNOSIS — O99330 Smoking (tobacco) complicating pregnancy, unspecified trimester: Secondary | ICD-10-CM

## 2020-08-06 DIAGNOSIS — O285 Abnormal chromosomal and genetic finding on antenatal screening of mother: Secondary | ICD-10-CM | POA: Insufficient documentation

## 2020-08-06 DIAGNOSIS — O0990 Supervision of high risk pregnancy, unspecified, unspecified trimester: Secondary | ICD-10-CM

## 2020-08-06 DIAGNOSIS — Z3A30 30 weeks gestation of pregnancy: Secondary | ICD-10-CM

## 2020-08-06 DIAGNOSIS — O09523 Supervision of elderly multigravida, third trimester: Secondary | ICD-10-CM

## 2020-08-06 DIAGNOSIS — M25352 Other instability, left hip: Secondary | ICD-10-CM
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
## 2020-08-06 NOTE — Telephone Encounter (Signed)
Attempt to reschedule pt after call from [place], Pregnancy Care Manager calls [facility] to find out about getting pt rescheduled, after pt says she missed last appointment; Left HIPPA-compliant message to call back [name] from [employer] for [employer] at [facility] at [phone] [facility] office).

## 2020-08-06 NOTE — Telephone Encounter (Signed)
Called Pt to advise of Orthopedic Consult scheduled for 08/14/20 @ 9am w/ Dr. Erlinda Hong. Pt verbalized understanding.

## 2020-08-07 LAB — CBC
Hematocrit: 35.4 % (ref 34.0–46.6)
Hemoglobin: 12 g/dL (ref 11.1–15.9)
MCH: 32.7 pg (ref 26.6–33.0)
MCHC: 33.9 g/dL (ref 31.5–35.7)
MCV: 97 fL (ref 79–97)
Platelets: 233 10*3/uL (ref 150–450)
RBC: 3.67 x10E6/uL — ABNORMAL LOW (ref 3.77–5.28)
RDW: 12.2 % (ref 11.7–15.4)
WBC: 12.5 10*3/uL — ABNORMAL HIGH (ref 3.4–10.8)

## 2020-08-07 LAB — GLUCOSE TOLERANCE, 2 HOURS W/ 1HR
Glucose, 1 hour: 148 mg/dL (ref 65–179)
Glucose, 2 hour: 93 mg/dL (ref 65–152)
Glucose, Fasting: 86 mg/dL (ref 65–91)

## 2020-08-07 LAB — RPR: RPR Ser Ql: NONREACTIVE

## 2020-08-07 LAB — HIV ANTIBODY (ROUTINE TESTING W REFLEX): HIV Screen 4th Generation wRfx: NONREACTIVE

## 2020-08-08 ENCOUNTER — Inpatient Hospital Stay (HOSPITAL_COMMUNITY)
Admission: AD | Admit: 2020-08-08 | Discharge: 2020-08-08 | Disposition: A | Discharge status: Home / Self Care | Payer: Medicaid Other | Attending: Obstetrics and Gynecology | Admitting: Obstetrics and Gynecology

## 2020-08-08 ENCOUNTER — Encounter: Payer: Medicaid Other | Admitting: Physical Therapy

## 2020-08-08 ENCOUNTER — Inpatient Hospital Stay (HOSPITAL_BASED_OUTPATIENT_CLINIC_OR_DEPARTMENT_OTHER): Payer: Medicaid Other

## 2020-08-08 ENCOUNTER — Other Ambulatory Visit: Payer: Self-pay

## 2020-08-08 ENCOUNTER — Encounter (HOSPITAL_COMMUNITY): Payer: Self-pay | Admitting: Obstetrics and Gynecology

## 2020-08-08 DIAGNOSIS — O26893 Other specified pregnancy related conditions, third trimester: Secondary | ICD-10-CM | POA: Diagnosis present

## 2020-08-08 DIAGNOSIS — M25552 Pain in left hip: Secondary | ICD-10-CM | POA: Insufficient documentation

## 2020-08-08 DIAGNOSIS — R609 Edema, unspecified: Secondary | ICD-10-CM

## 2020-08-08 DIAGNOSIS — O1203 Gestational edema, third trimester: Secondary | ICD-10-CM | POA: Diagnosis not present

## 2020-08-08 DIAGNOSIS — Z3A30 30 weeks gestation of pregnancy: Secondary | ICD-10-CM | POA: Insufficient documentation

## 2020-08-08 DIAGNOSIS — O99333 Smoking (tobacco) complicating pregnancy, third trimester: Secondary | ICD-10-CM | POA: Insufficient documentation

## 2020-08-08 DIAGNOSIS — Z886 Allergy status to analgesic agent status: Secondary | ICD-10-CM | POA: Insufficient documentation

## 2020-08-08 DIAGNOSIS — R202 Paresthesia of skin: Secondary | ICD-10-CM

## 2020-08-08 DIAGNOSIS — R200 Anesthesia of skin: Secondary | ICD-10-CM | POA: Insufficient documentation

## 2020-08-08 DIAGNOSIS — F17210 Nicotine dependence, cigarettes, uncomplicated: Secondary | ICD-10-CM | POA: Diagnosis not present

## 2020-08-08 DIAGNOSIS — M7989 Other specified soft tissue disorders: Secondary | ICD-10-CM

## 2020-08-08 NOTE — MAU Note (Signed)
Pt reports numbness in her left foot this am, reports the foot has been swollen for the last 3 days. Denies pain. Reports good fetal movement.

## 2020-08-08 NOTE — Discharge Instructions (Signed)
Peripheral Edema  Peripheral edema is swelling that is caused by a buildup of fluid. Peripheral edema most often affects the lower legs, ankles, and feet. It can also develop in the arms, hands, and face. The area of the body that has peripheral edema will look swollen. It may also feel heavy or warm. Your clothes may start to feel tight. Pressing on the area may make a temporary dent in your skin. You may not be able to move your swollen arm or leg as much as usual. There are many causes of peripheral edema. It can happen because of a complication of other conditions such as congestive heart failure, kidney disease, or a problem with your blood circulation. It also can be a side effect of certain medicines or because of an infection. It often happens to women during pregnancy. Sometimes, the cause is not known. Follow these instructions at home: Managing pain, stiffness, and swelling  Raise (elevate) your legs while you are sitting or lying down.  Move around often to prevent stiffness and to lessen swelling.  Do not sit or stand for long periods of time.  Wear support stockings as told by your health care provider.   Medicines  Take over-the-counter and prescription medicines only as told by your health care provider.  Your health care provider may prescribe medicine to help your body get rid of excess water (diuretic). General instructions  Pay attention to any changes in your symptoms.  Follow instructions from your health care provider about limiting salt (sodium) in your diet. Sometimes, eating less salt may reduce swelling.  Moisturize skin daily to help prevent skin from cracking and draining.  Keep all follow-up visits as told by your health care provider. This is important. Contact a health care provider if you have:  A fever.  Edema that starts suddenly or is getting worse, especially if you are pregnant or have a medical condition.  Swelling in only one leg.  Increased  swelling, redness, or pain in one or both of your legs.  Drainage or sores at the area where you have edema. Get help right away if you:  Develop shortness of breath, especially when you are lying down.  Have pain in your chest or abdomen.  Feel weak.  Feel faint. Summary  Peripheral edema is swelling that is caused by a buildup of fluid. Peripheral edema most often affects the lower legs, ankles, and feet.  Move around often to prevent stiffness and to lessen swelling. Do not sit or stand for long periods of time.  Pay attention to any changes in your symptoms.  Contact a health care provider if you have edema that starts suddenly or is getting worse, especially if you are pregnant or have a medical condition.  Get help right away if you develop shortness of breath, especially when lying down. This information is not intended to replace advice given to you by your health care provider. Make sure you discuss any questions you have with your health care provider. Document Revised: 11/11/2017 Document Reviewed: 11/11/2017 Elsevier Patient Education  2021 [employer].

## 2020-08-08 NOTE — Progress Notes (Signed)
Left lower ext venous duplex  has been completed. Refer to [facility] under chart review to view preliminary results.   08/08/2020  8:06 AM Laniah Grimm, Bonnye Fava

## 2020-08-08 NOTE — MAU Provider Note (Addendum)
Chief Complaint:  Foot Swelling   Event Date/Time   First Provider Initiated Contact with Patient 08/08/20 0636     HPI: [name] [name] is a 35 y.o. G1P0000 at 27w5dwho presents to maternity admissions reporting 4 day history of left leg swelling.  Has had long term pain in left hip (>10 years), but swelling is new.  Woke up and felt numbness.  Tried to walk around to see if it would change, but it did not. . She reports good fetal movement, denies LOF, vaginal bleeding, vaginal itching/burning, urinary symptoms, h/a, dizziness, n/v, diarrhea, constipation or fever/chills.    Other This is a new problem. The current episode started in the past 7 days. The problem occurs constantly. The problem has been unchanged. Pertinent negatives include no abdominal pain, chills, fever, myalgias or weakness. Nothing aggravates the symptoms. She has tried walking for the symptoms. The treatment provided no relief.    RN note: Pt reports numbness in her left foot this am, reports the foot has been swollen for the last 3 days. Denies pain. Reports good fetal movement.   Past Medical History: Past Medical History:  Diagnosis Date  . Anxiety   . Bipolar disorder ([place])   . Boils   . Headache(784.0)   . PCOS (polycystic ovarian syndrome) 02/26/2012  . Recurrent UTI 05/15/2013   Neg 03/2020  E. Coli  . Smoker 01/18/2019    Past obstetric history: OB History  Gravida Para Term Preterm AB Living  1 0 0 0 0 0  SAB IAB Ectopic Multiple Live Births  0 0 0 0 0    # Outcome Date GA Lbr Len/2nd Weight Sex Delivery Anes PTL Lv  1 Current             Past Surgical History: Past Surgical History:  Procedure Laterality Date  . LAPAROSCOPIC UNILATERAL SALPINGECTOMY Left    paratubal cystectomy    Family History: Family History  Problem Relation Age of Onset  . Mental illness Mother   . Thyroid disease Mother   . Alcohol abuse Brother   . Breast cancer Paternal Aunt     Social  History: Social History   Tobacco Use  . Smoking status: Current Every Day Smoker    Packs/day: 0.25    Types: Cigarettes  . Smokeless tobacco: Never Used  Vaping Use  . Vaping Use: Former  . Substances: THC  Substance Use Topics  . Alcohol use: Not Currently    Comment: rare  . Drug use: Yes    Types: Marijuana    Comment: smokes marijuana twice a week and no cocaine for almost a yearLAST  USE OF COCAINE MARCH,  LAST  MARIJUANA-  YESTERDAY     Allergies:  Allergies  Allergen Reactions  . Ibuprofen Nausea And Vomiting    Stomach upset    Meds:  Medications Prior to Admission  Medication Sig Dispense Refill Last Dose  . Prenatal Vit-Fe Fumarate-FA (MULTIVITAMIN-PRENATAL) 27-0.8 MG TABS tablet Take 1 tablet by mouth daily at 12 noon.   08/07/2020 at Unknown time    I have reviewed patient's Past Medical Hx, Surgical Hx, Family Hx, Social Hx, medications and allergies.   ROS:  Review of Systems  Constitutional: Negative for chills and fever.  Gastrointestinal: Negative for abdominal pain.  Musculoskeletal: Negative for myalgias.  Neurological: Negative for weakness.   Other systems negative  Physical Exam   Patient Vitals for the past 24 hrs:  BP Temp Temp src Pulse Resp  SpO2  08/08/20 0635 109/63 (!) 97.5 F (36.4 C) Oral 82 17 98 %   Constitutional: Well-developed, well-nourished female in no acute distress.  Cardiovascular: normal rate and rhythm Respiratory: normal effort, clear to auscultation bilaterally GI: Abd soft, non-tender, gravid appropriate for gestational age.   No rebound or guarding. MS: Extremities nontender, Trace Right leg edema, 1+ left leg edema, normal ROM        Right calf 38cm    Left calf  38.5cm        Negative Homans bilaterally Neurologic: Alert and oriented x 4.  GU: Neg CVAT.   FHT:  Baseline 125 , moderate variability, accelerations present, no decelerations Contractions: occasional irritability   Labs:  No results found for  this or any previous visit (from the past 24 hour(s)). A/Positive/-- (01/21 1039)  Imaging:  Left leg Doppler study ordered  MAU Course/MDM: Ordered Doppler study of left leg Awaiting transport to Radiology Report given to oncoming provider    Hansel Feinstein CNM, MSN Certified Nurse-Midwife 08/08/2020 0800   Negative doppler study - no sign of DVT Reviewed results with patient. Discussed tx of edema at home. Continue to wear maternity support belt & start wearing knee high compression socks. Reviewed reasons to return to MAU. Patient has f/u with OB & with ortho next week.      A/P: 1. Edema during pregnancy in third trimester   2. [redacted] weeks gestation of pregnancy    -compression socks -reviewed reasons to return to MAU -keep f/u appts  Jorje Guild, NP

## 2020-08-13 ENCOUNTER — Other Ambulatory Visit: Payer: Self-pay

## 2020-08-13 ENCOUNTER — Ambulatory Visit: Payer: Medicaid Other | Attending: Maternal & Fetal Medicine | Admitting: *Deleted

## 2020-08-13 ENCOUNTER — Ambulatory Visit: Payer: Medicaid Other

## 2020-08-13 ENCOUNTER — Ambulatory Visit (INDEPENDENT_AMBULATORY_CARE_PROVIDER_SITE_OTHER): Payer: Medicaid Other | Admitting: Obstetrics and Gynecology

## 2020-08-13 ENCOUNTER — Encounter: Payer: Self-pay | Admitting: *Deleted

## 2020-08-13 ENCOUNTER — Ambulatory Visit (HOSPITAL_BASED_OUTPATIENT_CLINIC_OR_DEPARTMENT_OTHER): Payer: Medicaid Other

## 2020-08-13 VITALS — BP 114/70 | HR 68

## 2020-08-13 VITALS — BP 101/66 | HR 81 | Wt 237.2 lb

## 2020-08-13 DIAGNOSIS — M25352 Other instability, left hip: Secondary | ICD-10-CM

## 2020-08-13 DIAGNOSIS — O0990 Supervision of high risk pregnancy, unspecified, unspecified trimester: Secondary | ICD-10-CM

## 2020-08-13 DIAGNOSIS — F3164 Bipolar disorder, current episode mixed, severe, with psychotic features: Secondary | ICD-10-CM

## 2020-08-13 DIAGNOSIS — O99333 Smoking (tobacco) complicating pregnancy, third trimester: Secondary | ICD-10-CM

## 2020-08-13 DIAGNOSIS — O285 Abnormal chromosomal and genetic finding on antenatal screening of mother: Secondary | ICD-10-CM

## 2020-08-13 DIAGNOSIS — O99323 Drug use complicating pregnancy, third trimester: Secondary | ICD-10-CM | POA: Diagnosis not present

## 2020-08-13 DIAGNOSIS — O09513 Supervision of elderly primigravida, third trimester: Secondary | ICD-10-CM | POA: Diagnosis not present

## 2020-08-13 DIAGNOSIS — F1910 Other psychoactive substance abuse, uncomplicated: Secondary | ICD-10-CM

## 2020-08-13 DIAGNOSIS — Z3A31 31 weeks gestation of pregnancy: Secondary | ICD-10-CM

## 2020-08-13 DIAGNOSIS — O99340 Other mental disorders complicating pregnancy, unspecified trimester: Secondary | ICD-10-CM | POA: Insufficient documentation

## 2020-08-13 DIAGNOSIS — Z362 Encounter for other antenatal screening follow-up: Secondary | ICD-10-CM | POA: Diagnosis not present

## 2020-08-13 DIAGNOSIS — O99343 Other mental disorders complicating pregnancy, third trimester: Secondary | ICD-10-CM

## 2020-08-13 DIAGNOSIS — O99330 Smoking (tobacco) complicating pregnancy, unspecified trimester: Secondary | ICD-10-CM

## 2020-08-13 DIAGNOSIS — F17210 Nicotine dependence, cigarettes, uncomplicated: Secondary | ICD-10-CM

## 2020-08-13 DIAGNOSIS — O99213 Obesity complicating pregnancy, third trimester: Secondary | ICD-10-CM | POA: Diagnosis not present

## 2020-08-13 DIAGNOSIS — E669 Obesity, unspecified: Secondary | ICD-10-CM

## 2020-08-13 DIAGNOSIS — O09523 Supervision of elderly multigravida, third trimester: Secondary | ICD-10-CM

## 2020-08-13 NOTE — Progress Notes (Signed)
   PRENATAL VISIT NOTE  Subjective:  [name] [name] is a 35 y.o. G1P0000 at [redacted]w[redacted]d being seen today for ongoing prenatal care.  She is currently monitored for the following issues for this high-risk pregnancy and has Hypomenorrhea/oligomenorrhea; Polysubstance abuse ([employer]); Bipolar disorder, curr episode mixed, severe, with psychotic features ([place]); Cannabis use disorder, severe, dependence ([place]); Supervision of high risk pregnancy, antepartum; AMA (advanced maternal age) multigravida 36+; Tobacco use affecting pregnancy, antepartum; Hip instability, left; Increased SMA carrier risk on Horizon testing; and [redacted] weeks gestation of pregnancy on their problem list.  Patient doing well with no acute concerns today. She reports no complaints.  Contractions: Not present. Vag. Bleeding: None.  Movement: Present. Denies leaking of fluid.   Discussed BTL with patient.  She was under the impression that it was semi-permanent and could be reversed.  Advised pt that she should consider IUD or nexplanon if she wanted something reversible.  Pt also does not want to deliver at [facility] because she feels her boyfriend's ex works on labor and delivery and will hurt her or her baby.  She wants to deliver at [facility] advised we do not deliver at [facility] and she may want to consider transfer to a practice that delivers there.  The following portions of the patient's history were reviewed and updated as appropriate: allergies, current medications, past family history, past medical history, past social history, past surgical history and problem list. Problem list updated.  Objective:   Vitals:   08/13/20 0910  BP: 101/66  Pulse: 81  Weight: 237 lb 3.2 oz (107.6 kg)    Fetal Status: Fetal Heart Rate (bpm): 131 Fundal Height: 31 cm Movement: Present     General:  Alert, oriented and cooperative. Patient is in no acute distress.  Skin: Skin is warm and dry. No rash noted.    Cardiovascular: Normal heart rate noted  Respiratory: Normal respiratory effort, no problems with respiration noted  Abdomen: Soft, gravid, appropriate for gestational age.  Pain/Pressure: Present     Pelvic: Cervical exam deferred        Extremities: Normal range of motion.  Edema: Trace  Mental Status:  Normal mood and affect. Normal behavior. Normal judgment and thought content.   Assessment and Plan:  Pregnancy: G1P0000 at [redacted]w[redacted]d  1. Supervision of high risk pregnancy, antepartum Continue routine care.  Discuss BTL plan at next visit - Amb ref to [place] of advanced maternal age in third trimester   3. Tobacco use affecting pregnancy, antepartum   4. Hip instability, left Per PT, pt may not do well with traditional positioning for birth, consider primary c/s ?  5. Increased SMA carrier risk on Horizon testing   6. Bipolar disorder, curr episode mixed, severe, with psychotic features ([place]) Due to pt's delivery concerns and anxiety, recommend she see behavorial health to discuss - Amb ref to [place]  7. [redacted] weeks gestation of pregnancy   Preterm labor symptoms and general obstetric precautions including but not limited to vaginal bleeding, contractions, leaking of fluid and fetal movement were reviewed in detail with the patient.  Please refer to After Visit Summary for other counseling recommendations.   Return in about 2 weeks (around 08/27/2020) for [facility], in person.   Lynnda Shields, MD Faculty Attending Center for [facility]

## 2020-08-14 ENCOUNTER — Encounter: Payer: Self-pay | Admitting: Orthopaedic Surgery

## 2020-08-14 ENCOUNTER — Ambulatory Visit (INDEPENDENT_AMBULATORY_CARE_PROVIDER_SITE_OTHER): Payer: Medicaid Other | Admitting: Orthopaedic Surgery

## 2020-08-14 DIAGNOSIS — M25552 Pain in left hip: Secondary | ICD-10-CM | POA: Diagnosis not present

## 2020-08-14 NOTE — BH Specialist Note (Signed)
Integrated Behavioral Health via Telemedicine Visit  08/14/2020 [name] [name] [name] [id number]  Number of [place] visits: 1 Session Start time: 2:42 Session End time: 3:04 Total time:  22  Referring Provider: Lynnda Shields, MD Patient/Family location: [place], [place] [facility] Provider location: Center for [place] [facility]  All persons participating in visit: [name] and [place]   Types of Service: Telephone visit  I connected with [name] and/or [name]  via  Telephone or [employer]  (Video is Caregility application) and verified that I am speaking with the correct person using two identifiers. Discussed confidentiality: Yes   I discussed the limitations of telemedicine and the availability of in person appointments.  Discussed there is a possibility of technology failure and discussed alternative modes of communication if that failure occurs.  I discussed that engaging in this telemedicine visit, they consent to the provision of behavioral healthcare and the services will be billed under their insurance.  Patient and/or legal guardian expressed understanding and consented to Telemedicine visit: Yes   Presenting Concerns: Patient and/or family reports the following symptoms/concerns: Pt states her primary concern today is worry over uncertainty and safety in labor and delivery; requests specific employees (with personal relationship with [name]) to not handle her baby or prepare food she eats, during her stay at the hospital. Pt also concerned that her birth doula is no longer practicing and uncertainty about having support person(s) in labor.  Duration of problem: Current pregnancy; Severity of problem: mild  Patient and/or Family's Strengths/Protective Factors: Sense of purpose  Goals Addressed: Patient will:  Reduce  symptoms of: stress   Increase knowledge and/or ability of: stress reduction   Demonstrate ability to: Increase healthy adjustment to current life circumstances and Increase adequate support systems for patient/family  Progress towards Goals: Ongoing  Interventions: Interventions utilized:  Solution-Focused Strategies, Psychoeducation and/or Health Education, and Link to [employer] Standardized Assessments completed:  PHQ9/GAD7 given in past two weeks  Patient and/or Family Response: Pt agrees with treatment plan  Assessment: Patient currently experiencing Psychosocial stress and History of bipolar affective disorder.   Patient may benefit from psychoeducation and brief therapeutic interventions regarding coping with symptoms of anxiety .  Plan: Follow up with behavioral health clinician on : 1-2 weeks Behavioral recommendations:  -Continue taking prenatal vitamin daily as recommended by medical provider -Consider obtaining another doula (list on AVS) -Consider registering for childbirth education class -Discuss pain management options at upcoming medical appointment on 09/11/20 -Read through Postpartum Planner (on After Visit Summary) Referral(s): [place] (In Clinic)  I discussed the assessment and treatment plan with the patient and/or parent/guardian. They were provided an opportunity to ask questions and all were answered. They agreed with the plan and demonstrated an understanding of the instructions.   They were advised to call back or seek an in-person evaluation if the symptoms worsen or if the condition fails to improve as anticipated.  [name], LCSW  Depression screen [facility] 2/9 08/27/2020 08/13/2020 07/02/2020 05/21/2020 03/23/2020  Decreased Interest 0 0 0 0 0  Down, Depressed, Hopeless 0 0 0 0 0  PHQ - 2 Score 0 0 0 0 0  Altered sleeping 0 0 1 0 0  Tired, decreased energy 0 0 0 0 0  Change in appetite 0 0 1 0 0  Feeling bad  or failure about yourself  0 0  0 0 0  Trouble concentrating 0 0 0 0 0  Moving slowly or fidgety/restless 0 0 0 0 0  Suicidal thoughts 0 0 0 0 0  PHQ-9 Score 0 0 2 0 0  Difficult doing work/chores - - Not difficult at all - -   GAD 7 : Generalized Anxiety Score 08/27/2020 08/13/2020 07/02/2020 05/21/2020  Nervous, Anxious, on Edge 0 0 1 0  Control/stop worrying 0 0 0 0  Worry too much - different things 0 0 0 0  Trouble relaxing 0 0 0 0  Restless 0 0 0 0  Easily annoyed or irritable 0 0 1 0  Afraid - awful might happen 0 0 0 0  Total GAD 7 Score 0 0 2 0

## 2020-08-14 NOTE — Progress Notes (Signed)
Office Visit Note   Patient: [name] [name]           Date of Birth: [birth year]           MRN: [id number] Visit Date: 08/14/2020              Requested by: Aletha Halim, MD [place],  [place] [address] PCP: [facility]   Assessment & Plan: Visit Diagnoses:  1. Pain in left hip     Plan: Impression is left hip probable labral tear.  At this point, her history and clinical exam correlate with a labral tear.  We have discussed the fact that if she were not pregnant we would refer her to Dr. Junius Roads for ultrasound-guided cortisone injection.  For now, she will need to wait until after delivery to proceed with this as this is not healthy for the unborn fetus.  In the meantime, we do not feel as though we can make a recommendation on C-section versus vaginal birth, but would prefer her to talk further with her OB/GYN about this.  She will follow-up with [place] as needed.  Follow-Up Instructions: No follow-ups on file.   Orders:  No orders of the defined types were placed in this encounter.  No orders of the defined types were placed in this encounter.     Procedures: No procedures performed   Clinical Data: No additional findings.   Subjective: Chief Complaint  Patient presents with   Left Hip - Pain   Right Hip - Pain    HPI patient is a pleasant 35 year old female who comes in today with left hip pain.  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  She fell landing awkwardly in a split like position on the left.  She has had pain to the groin since.  She is now [redacted] weeks pregnant with continued pain.  All her pain is deep within the groin.  This is intermittent in nature but worse getting in and out of the car as well as with other activities internally and externally rotate her hip.  She does not take medication for the pain.  She denies any paresthesias.  She has  recently been to physical therapy which did not help.  She does have swelling to the left lower extremity over the past week but notes that she recently underwent Doppler ultrasound for this which was negative for DVT.  Today, her main concern is going through a vaginal birth.  Review of Systems as detailed in HPI.  All others reviewed and are negative.   Objective: Vital Signs: LMP 01/06/2020 (Approximate)   Physical Exam well-developed well-nourished female in no acute distress.  Alert and oriented x3.  Ortho Exam left hip exam shows a negative logroll.  Markedly positive FADIR.  Negative straight leg raise.  Minimal tenderness to the greater trochanter.  No focal weakness.  She is neurovascular intact distally.  Specialty Comments:  No specialty comments available.  Imaging: [place] MFM OB FOLLOW UP  Result Date: 08/13/2020 ----------------------------------------------------------------------  OBSTETRICS REPORT                       (Signed Final 08/13/2020 11:58 am) ---------------------------------------------------------------------- Patient Info  ID #:       [id number]  D.O.B.:  07/02/1985 (35 yrs)  Name:       [name] [name]             Visit Date: 08/13/2020 11:23 am              WHITE ---------------------------------------------------------------------- Performed By  Attending:        Johnell Comings MD         Ref. Address:     [address]                                                             [place], [place]  Performed By:     Rodrigo Ran BS      [facility]                                                             [facility]                                                             [facility]  Referred By:      [facility]  ---------------------------------------------------------------------- Orders  #  Description                           Code        Ordered By  1  [place] MFM OB FOLLOW UP                   70962.83    Sander Nephew ----------------------------------------------------------------------  #  Order #                     Accession #  Episode #  1  [id number]                   [id number]                 [id number] ---------------------------------------------------------------------- Indications  Drug use complicating pregnancy, third         O99.323  trimester  Advanced maternal age primigravida 58+,        O4.513  third trimester  [redacted] weeks gestation of pregnancy                E9B.28  Obesity complicating pregnancy, third          O99.213  trimester  Tobacco use complicating pregnancy, third      O99.333  trimester  Other mental disorder complicating             O99.340  pregnancy, unspecified trimester  LR NIPS  Encounter for other antenatal screening        Z36.2  follow-up ---------------------------------------------------------------------- Fetal Evaluation  Num Of Fetuses:         1  Fetal Heart Rate(bpm):  135  Cardiac Activity:       Observed  Presentation:           Cephalic  P. Cord Insertion:      Previously Visualized  Amniotic Fluid  AFI FV:      Within normal limits  AFI Sum(cm)     %Tile       Largest Pocket(cm)  18.1            67          5.3  RUQ(cm)       RLQ(cm)       LUQ(cm)        LLQ(cm)  5.3           4             4.7            4.1 ---------------------------------------------------------------------- Biometry  BPD:      78.7  mm     G. Age:  31w 4d         44  %    CI:        74.37   %    70 - 86                                                          FL/HC:      19.1   %    19.3 - 21.3  HC:      289.7  mm     G. Age:  31w 6d         25  %    HC/AC:      1.02        0.96 - 1.17  AC:      284.1  mm     G. Age:  32w 3d          76  %    FL/BPD:     70.3   %    71 - 87  FL:       55.3  mm     G. Age:  29w 1d        1.9  %  FL/AC:      19.5   %    20 - 24  HUM:      53.5  mm     G. Age:  31w 1d         46  %  Est. FW:    1745  gm    3 lb 14 oz      35  % ---------------------------------------------------------------------- OB History  Blood Type:   A+  Gravidity:    1         Term:   0        Prem:   0        SAB:   0  TOP:          0       Ectopic:  0        Living: 0 ---------------------------------------------------------------------- Gestational Age  LMP:           31w 3d        Date:  01/06/20                 EDD:   10/12/20  U/S Today:     31w 2d                                        EDD:   10/13/20  Best:          31w 3d     Det. By:  LMP  (01/06/20)          EDD:   10/12/20 ---------------------------------------------------------------------- Anatomy  Cranium:               Appears normal         LVOT:                   Appears normal  Cavum:                 Previously seen        Aortic Arch:            Previously seen  Ventricles:            Previously seen        Ductal Arch:            Previously seen  Choroid Plexus:        Previously seen        Diaphragm:              Previously seen  Cerebellum:            Previously seen        Stomach:                Appears normal, left                                                                        sided  Posterior Fossa:       Previously seen        Abdomen:                Appears normal  Nuchal Fold:  Previously seen        Abdominal Wall:         Previously seen  Face:                  Appears normal         Cord Vessels:           Previously seen                         (orbits and profile)  Lips:                  Appears normal         Kidneys:                Appear normal  Palate:                Appears normal         Bladder:                Appears normal  Thoracic:              Appears normal         Spine:                  Previously seen  Heart:                  Previously seen        Upper Extremities:      Previously seen  RVOT:                  Previously seen        Lower Extremities:      Previously seen  Other:  Heels previously visualized. Nasal bone visualized. Lenses          visualized. VC, 3VV and 3VTV previously visualized. ---------------------------------------------------------------------- Cervix Uterus Adnexa  Cervix  Not visualized (advanced GA >24wks)  Uterus  No abnormality visualized.  Right Ovary  Not visualized.  Left Ovary  Not visualized.  Cul De Sac  No free fluid seen.  Adnexa  No abnormality visualized. ---------------------------------------------------------------------- Comments  This patient was seen for a follow up growth scan due to  maternal drug use and advanced maternal age.  She admits  to smoking marijuana daily.  She denies any problems since  her last exam.  She was informed that the fetal growth and amniotic fluid  level appears appropriate for her gestational age.  As the fetal growth is within normal limits, no further exams  were scheduled in our office. ----------------------------------------------------------------------                   Johnell Comings, MD Electronically Signed Final Report   08/13/2020 11:58 am ----------------------------------------------------------------------    PMFS History: Patient Active Problem List   Diagnosis Date Noted   [redacted] weeks gestation of pregnancy 08/13/2020   Increased SMA carrier risk on Horizon testing 08/06/2020   Hip instability, left 07/02/2020   Supervision of high risk pregnancy, antepartum 03/23/2020   AMA (advanced maternal age) multigravida 35+ 03/23/2020   Tobacco use affecting pregnancy, antepartum 03/23/2020   Bipolar disorder, curr episode mixed, severe, with psychotic features ([place]) 08/02/2014   Cannabis use disorder, severe, dependence ([place]) 08/02/2014   Polysubstance abuse ([place]) 05/12/2013   Hypomenorrhea/oligomenorrhea 02/26/2012   Past Medical History:   Diagnosis Date   Anxiety    Bipolar disorder ([place])  Boils    Headache(784.0)    PCOS (polycystic ovarian syndrome) 02/26/2012   Recurrent UTI 05/15/2013   Neg 03/2020  E. Coli   Smoker 01/18/2019    Family History  Problem Relation Age of Onset   Mental illness Mother    Thyroid disease Mother    Alcohol abuse Brother    Breast cancer Paternal Aunt     Past Surgical History:  Procedure Laterality Date   LAPAROSCOPIC UNILATERAL SALPINGECTOMY Left    paratubal cystectomy   Social History   Occupational History   Not on file  Tobacco Use   Smoking status: Every Day    Packs/day: 0.25    Pack years: 0.00    Types: Cigarettes   Smokeless tobacco: Never  Vaping Use   Vaping Use: Former   Substances: THC  Substance and Sexual Activity   Alcohol use: Not Currently    Comment: rare   Drug use: Yes    Types: Marijuana    Comment: smokes marijuana twice a week and no cocaine for almost a yearLAST  USE OF COCAINE MARCH,  LAST  MARIJUANA-  YESTERDAY    Sexual activity: Yes    Birth control/protection: None

## 2020-08-27 ENCOUNTER — Other Ambulatory Visit: Payer: Self-pay

## 2020-08-27 ENCOUNTER — Ambulatory Visit (INDEPENDENT_AMBULATORY_CARE_PROVIDER_SITE_OTHER): Payer: Medicaid Other | Admitting: Clinical

## 2020-08-27 ENCOUNTER — Ambulatory Visit (INDEPENDENT_AMBULATORY_CARE_PROVIDER_SITE_OTHER): Payer: Medicaid Other | Admitting: Family Medicine

## 2020-08-27 VITALS — BP 109/67 | HR 86 | Wt 234.9 lb

## 2020-08-27 DIAGNOSIS — Z8659 Personal history of other mental and behavioral disorders: Secondary | ICD-10-CM | POA: Diagnosis not present

## 2020-08-27 DIAGNOSIS — O0990 Supervision of high risk pregnancy, unspecified, unspecified trimester: Secondary | ICD-10-CM

## 2020-08-27 DIAGNOSIS — O09523 Supervision of elderly multigravida, third trimester: Secondary | ICD-10-CM

## 2020-08-27 DIAGNOSIS — Z658 Other specified problems related to psychosocial circumstances: Secondary | ICD-10-CM | POA: Diagnosis not present

## 2020-08-27 NOTE — Progress Notes (Signed)
   PRENATAL VISIT NOTE  Subjective:  [name] [name] is a 35 y.o. G1P0000 at [redacted]w[redacted]d being seen today for ongoing prenatal care.  She is currently monitored for the following issues for this low-risk pregnancy and has Hypomenorrhea/oligomenorrhea; Polysubstance abuse ([place]); Bipolar disorder, curr episode mixed, severe, with psychotic features ([place]); Cannabis use disorder, severe, dependence ([place]); Supervision of high risk pregnancy, antepartum; AMA (advanced maternal age) multigravida 39+; Tobacco use affecting pregnancy, antepartum; Hip instability, left; and Increased SMA carrier risk on Horizon testing on their problem list.  Patient reports no complaints.  Contractions: Irritability. Vag. Bleeding: None.  Movement: Present. Denies leaking of fluid.   The following portions of the patient's history were reviewed and updated as appropriate: allergies, current medications, past family history, past medical history, past social history, past surgical history and problem list.   Objective:   Vitals:   08/27/20 1108  BP: 109/67  Pulse: 86  Weight: 234 lb 14.4 oz (106.5 kg)    Fetal Status: Fetal Heart Rate (bpm): 131   Movement: Present     General:  Alert, oriented and cooperative. Patient is in no acute distress.  Skin: Skin is warm and dry. No rash noted.   Cardiovascular: Normal heart rate noted  Respiratory: Normal respiratory effort, no problems with respiration noted  Abdomen: Soft, gravid, appropriate for gestational age.  Pain/Pressure: Present     Pelvic: Cervical exam deferred        Extremities: Normal range of motion.  Edema: Trace  Mental Status: Normal mood and affect. Normal behavior. Normal judgment and thought content.   Assessment and Plan:  Pregnancy: G1P0000 at [redacted]w[redacted]d 1. Multigravida of advanced maternal age in third trimester Low risk NIPT  2. Supervision of high risk pregnancy, antepartum Continue routine prenatal care.  3. Has hip pain Per ortho, ok  for attempts at vaginal delivery  4. Bipolar/anxiety Needs to see [name]  Preterm labor symptoms and general obstetric precautions including but not limited to vaginal bleeding, contractions, leaking of fluid and fetal movement were reviewed in detail with the patient. Please refer to After Visit Summary for other counseling recommendations.   Return in 2 weeks (on 09/10/2020) for please book with me.  Future Appointments  Date Time Provider Department Center  09/11/2020  9:55 AM Donnamae Jude, MD [facility]    Donnamae Jude, MD

## 2020-08-27 NOTE — Patient Instructions (Signed)
[email or web address].com/.aspx">  Third Trimester of Pregnancy  The third trimester of pregnancy is from week 28 through week 40. This is months 7 through 9. The third trimester is a time when the unborn baby (fetus) is growing rapidly. At the end of the ninth month, the fetus is about 20inches long and weighs 6-10 pounds. Body changes during your third trimester During the third trimester, your body will continue to go through many changes.The changes vary and generally return to normal after your baby is born. Physical changes Your weight will continue to increase. You can expect to gain 25-35 pounds (11-16 kg) by the end of the pregnancy if you begin pregnancy at a normal weight. If you are underweight, you can expect to gain 28-40 lb (about 13-18 kg), and if you are overweight, you can expect to gain 15-25 lb (about 7-11 kg). You may begin to get stretch marks on your hips, abdomen, and breasts. Your breasts will continue to grow and may hurt. A yellow fluid (colostrum) may leak from your breasts. This is the first milk you are producing for your baby. You may have changes in your hair. These can include thickening of your hair, rapid growth, and changes in texture. Some people also have hair loss during or after pregnancy, or hair that feels dry or thin. Your belly button may stick out. You may notice more swelling in your hands, face, or ankles. Health changes You may have heartburn. You may have constipation. You may develop hemorrhoids. You may develop swollen, bulging veins (varicose veins) in your legs. You may have increased body aches in the pelvis, back, or thighs. This is due to weight gain and increased hormones that are relaxing your joints. You may have increased tingling or numbness in your hands, arms, and legs. The skin on your abdomen may also feel numb. You may feel short of breath because of your expanding uterus. Other  changes You may urinate more often because the fetus is moving lower into your pelvis and pressing on your bladder. You may have more problems sleeping. This may be caused by the size of your abdomen, an increased need to urinate, and an increase in your body's metabolism. You may notice the fetus "dropping," or moving lower in your abdomen (lightening). You may have increased vaginal discharge. You may notice that you have pain around your pelvic bone as your uterus distends. Follow these instructions at home: Medicines Follow your health care provider's instructions regarding medicine use. Specific medicines may be either safe or unsafe to take during pregnancy. Do not take any medicines unless approved by your health care provider. Take a prenatal vitamin that contains at least 600 micrograms (mcg) of folic acid. Eating and drinking Eat a healthy diet that includes fresh fruits and vegetables, whole grains, good sources of protein such as meat, eggs, or tofu, and low-fat dairy products. Avoid raw meat and unpasteurized juice, milk, and cheese. These carry germs that can harm you and your baby. Eat 4 or 5 small meals rather than 3 large meals a day. You may need to take these actions to prevent or treat constipation: Drink enough fluid to keep your urine pale yellow. Eat foods that are high in fiber, such as beans, whole grains, and fresh fruits and vegetables. Limit foods that are high in fat and processed sugars, such as fried or sweet foods. Activity Exercise only as directed by your health care provider. Most people can continue their usual exercise routine during pregnancy. Try  to exercise for 30 minutes at least 5 days a week. Stop exercising if you experience contractions in the uterus. Stop exercising if you develop pain or cramping in the lower abdomen or lower back. Avoid heavy lifting. Do not exercise if it is very hot or humid or if you are at a high altitude. If you choose to,  you may continue to have sex unless your health care provider tells you not to. Relieving pain and discomfort Take frequent breaks and rest with your legs raised (elevated) if you have leg cramps or low back pain. Take warm sitz baths to soothe any pain or discomfort caused by hemorrhoids. Use hemorrhoid cream if your health care provider approves. Wear a supportive bra to prevent discomfort from breast tenderness. If you develop varicose veins: Wear support hose as told by your health care provider. Elevate your feet for 15 minutes, 3-4 times a day. Limit salt in your diet. Safety Talk to your health care provider before traveling far distances. Do not use hot tubs, steam rooms, or saunas. Wear your seat belt at all times when driving or riding in a car. Talk with your health care provider if someone is verbally or physically abusive to you. Preparing for birth To prepare for the arrival of your baby: Take prenatal classes to understand, practice, and ask questions about labor and delivery. Visit the hospital and tour the maternity area. Purchase a rear-facing car seat and make sure you know how to install it in your car. Prepare the baby's room or sleeping area. Make sure to remove all pillows and stuffed animals from the baby's crib to prevent suffocation. General instructions Avoid cat litter boxes and soil used by cats. These carry germs that can cause birth defects in the baby. If you have a cat, ask someone to clean the litter box for you. Do not douche or use tampons. Do not use scented sanitary pads. Do not use any products that contain nicotine or tobacco, such as cigarettes, e-cigarettes, and chewing tobacco. If you need help quitting, ask your health care provider. Do not use any herbal remedies, illegal drugs, or medicines that were not prescribed to you. Chemicals in these products can harm your baby. Do not drink alcohol. You will have more frequent prenatal exams during the  third trimester. During a routine prenatal visit, your health care provider will do a physical exam, perform tests, and discuss your overall health. Keep all follow-up visits. This is important. Where to find more information American Pregnancy Association: americanpregnancy.[place] and Gynecologists: PoolDevices.com.pt Office on [employer] Health: KeywordPortfolios.com.br Contact a health care provider if you have: A fever. Mild pelvic cramps, pelvic pressure, or nagging pain in your abdominal area or lower back. Vomiting or diarrhea. Bad-smelling vaginal discharge or foul-smelling urine. Pain when you urinate. A headache that does not go away when you take medicine. Visual changes or see spots in front of your eyes. Get help right away if: Your water breaks. You have regular contractions less than 5 minutes apart. You have spotting or bleeding from your vagina. You have severe abdominal pain. You have difficulty breathing. You have chest pain. You have fainting spells. You have not felt your baby move for the time period told by your health care provider. You have new or increased pain, swelling, or redness in an arm or leg. Summary The third trimester of pregnancy is from week 28 through week 40 (months 7 through 9). You may have more problems  sleeping. This can be caused by the size of your abdomen, an increased need to urinate, and an increase in your body's metabolism. You will have more frequent prenatal exams during the third trimester. Keep all follow-up visits. This is important. This information is not intended to replace advice given to you by your health care provider. Make sure you discuss any questions you have with your healthcare provider. Document Revised: 07/27/2019 Document Reviewed: 06/02/2019 Elsevier Patient Education  2022 [employer].

## 2020-08-27 NOTE — Patient Instructions (Addendum)
Center for [facility] [place], [place] [address] [phone] (main office) [phone] [facility] office)   [email or web address] (view virtual tour of [facility], register for childbirth classes, etc)  DOULA LIST   Beautiful Beginnings Doula  [place]  [phone]  [place].[email or web address] .[email or web address]  beautifulbeginningsdoula.com  Zula the Frank [phone]  [email or web address]   Precious Lumberton   [email or web address]   ??THE MOTHERLY DOULA?? Lajuana Ripple   [phone]   themotherlydoula_1 .com     The Abundant Life Doula  Mattie Marlin  [phone]    Theabundantlifedoula_2 .com evelyntinsley.org   [facility] [place]     [phone]     angiesdoulaservices_3 .com angeisdoulaservcies.com   Lenoria Farrier: [place] [phone]       Remmcmillen_4 .com  [email or web address]   [place] [phone]   ameliamattocks.com   [place], [place]  Llana Aliment  [phone]  tiffany_5 .com   [email or web address]   Ease Doula Collaborative   Burney Gauze   [phone]  Easedoulas_6 .com easedoulas.[place]  [phone] MaryWaltNCDoula_7 .com ContactWire.is  Natural Baby Doulas  Almyra Brace         [facility]       Deretha Emory     [phone] contact_8 .com  naturalbabydoulas.[place] [phone] Info_9 .com   Priscille Kluver Doula Services  Abbie Sons     [phone]  Devoteddoulaservices_10 .com BuilderWeekly.hu  Soleil Doula  [place]     [phone]  soleildoulaco_11 .com  Facebook and IG _12 .Vivia Birmingham   [phone] bccooper_13 .Dola Argyle [phone] bmgrant7_14 .com   Delanna Ahmadi  [phone] [email or web address] .[email or web address]     Libby Maw  [phone] madaboutmemories_16 .com   IG _17    Henrine Screws    [phone] cishealthnetwork_18 .com   Ellis Savage" Free  [phone] [email or web address]_19 .com    Mtende Roll  [phone] Rollmtende_20 .com   Susie Williams   ss.[email or web address]_21 .com    Crissie Reese    [phone] Lnavachavez_22 .com     Carlean Jews  [phone] Jsscayivi942_23 .com    Rigoberto Noel  [phone] Thedoulazar_24 .com thelaborladies.com/    [place] Rhem    [phone]   Baby on the Brain Karie Mainland  [phone] [facility].doula_25 .com babyonthebrain.org  Doula Mama Hollie [place] [phone] Katie_26 .com Doulamamanc.com  Baby on the Brain Karie Mainland  [phone] [facility].doula_27 .com babyonthebrain.Antoine Owens Shark [phone]  bethanndoulaservices_28 .com  www.[email or web address].Elfredia Nevins Harris-Jones  [phone] shawntina129_29 .com   Renaee Munda [phone] Tgietzen_30 .[email or web address]   Einar Grad [phone] carlee.henry_31 .com   Lance Muss  [phone] leatrice.priest_32 .com  [employer]  Deri Fuelling  [phone] moments714_33 .com   Maxwell Caul [phone] lshevon85_34 .com  MOOR Divine Myeka Dunn  moordivine_35 .com   Threasa Alpha [phone] tsheana.turner_36 .Paula Compton [phone] info_37 .com   Juante Randleman [phone] juante.[email or web address]_38 .com        BRAINSTORMING  Develop a Plan Goals: Provide a way to start conversation about your new life with a baby Assist parents in recognizing and using resources within their reach Help pave the way before birth for an easier period of transition afterwards.  Make a list of the following information to  keep in a central location: Full name of Mom and Partner: _____________________________________________ 28 full name and Date of Birth: ___________________________________________ Home Address: ___________________________________________________________ ________________________________________________________________________ Home Phone: ____________________________________________________________  Parents' cell numbers: _____________________________________________________ ________________________________________________________________________ Name and contact info for OB: ______________________________________________ Name and contact info for Pediatrician:________________________________________ Contact info for Lactation Consultants: ________________________________________  REST and SLEEP *You each need at least 4-5 hours of uninterrupted sleep every day. Write specific names and contact information.* How are you going to rest in the postpartum period? While partner's home? When partner returns to work? When you both return to work? Where will your baby sleep? Who is available to help during the day? Evening? Night? Who could move in for a period to help support you? What are some ideas to help you get enough sleep? __________________________________________________________________________________________________________________________________________________________________________________________________________________________________________ NUTRITIOUS FOOD AND DRINK *Plan for meals before your baby is born so you can have healthy food to eat during the immediate postpartum period.* Who will look after breakfast? Lunch? Dinner? List names and contact information. Brainstorm quick, healthy ideas for each meal. What can you do before baby is born to prepare meals for the postpartum period? How can others help you with meals? Which grocery stores provide online shopping and  delivery? Which restaurants offer take-out or delivery options? ______________________________________________________________________________________________________________________________________________________________________________________________________________________________________________________________________________________________________________________________________________________________________________________________________  CARE FOR MOM *It's important that mom is cared for and pampered in the postpartum period. Remember, the most important ways new mothers need care are: sleep, nutrition, gentle exercise, and time off.* Who can come take care of mom during this period? Make a list of people with their contact information. List some activities that make you feel cared for, rested, and energized? Who can make sure you have opportunities to do these things? Does mom have a space of her very own within your home that's just for her? Make a "[facility]" where she can be comfortable, rest, and renew herself daily. ______________________________________________________________________________________________________________________________________________________________________________________________________________________________________________________________________________________________________________________________________________________________________________________________________    CARE FOR AND FEEDING BABY *Knowledgeable and encouraging people will offer the best support with regard to feeding your baby.* Educate yourself and choose the best feeding option for your baby. Make a list of people who will guide, support, and be a resource for you as your care for and feed your baby. (Friends that have breastfed or are currently breastfeeding, lactation consultants, breastfeeding support groups, etc.) Consider a postpartum doula. (These websites can give you  information: dona.org & BuyingShow.es) Seek out local breastfeeding resources like the breastfeeding support group at [employer] or [employer]. ______________________________________________________________________________________________________________________________________________________________________________________________________________________________________________________________________________________________________________________________________________________________________________________________________  Verner Chol AND ERRANDS Who can help with a thorough cleaning before baby is born? Make a list of people who will help with housekeeping and chores, like laundry, light cleaning, dishes, bathrooms, etc. Who can run some errands for you? What can you do to make sure you are stocked with basic supplies before baby is born? Who is going to do the shopping? ______________________________________________________________________________________________________________________________________________________________________________________________________________________________________________________________________________________________________________________________________________________________________________________________________     Family Adjustment *Nurture yourselves.it helps parents be more loving and allows for better bonding with their child.* What sorts of things do you and partner enjoy doing together? Which activities help you to connect and strengthen your relationship? Make a list of those things. Make a list of people whom you trust to care for your baby so you can have some time together as a couple. What types of things help partner feel connected to Mom? Make a list. What needs will partner have in order to bond with baby? Other children? Who will care for them when you go into labor and while you are in the hospital? Think about what the needs of your  older children might be. Who can help you meet those needs? In what  ways are you helping them prepare for bringing baby home? List some specific strategies you have for family adjustment. _______________________________________________________________________________________________________________________________________________________________________________________________________________________________________________________________________________________________________________________________________________  SUPPORT *Someone who can empathize with experiences normalizes your problems and makes them more bearable.* Make a list of other friends, neighbors, and/or co-workers you know with infants (and small children, if applicable) with whom you can connect. Make a list of local or online support groups, mom groups, etc. in which you can be involved. ______________________________________________________________________________________________________________________________________________________________________________________________________________________________________________________________________________________________________________________________________________________________________________________________________  Childcare Plans Investigate and plan for childcare if mom is returning to work. Talk about mom's concerns about her transition back to work. Talk about partner's concerns regarding this transition.  Mental Health *Your mental health is one of the highest priorities for a pregnant or postpartum mom.* 1 in 5 women experience anxiety and/or depression from the time of conception through the first year after birth. Postpartum Mood Disorders are the #1 complication of pregnancy and childbirth and the suffering experienced by these mothers is not necessary! These illnesses are temporary and respond well to treatment, which often includes self-care, social support,  talk therapy, and medication when needed. Women experiencing anxiety and depression often say things like: "I'm supposed to be happy.why do I feel so sad?", "Why can't I snap out of it?", "I'm having thoughts that scare me." There is no need to be embarrassed if you are feeling these symptoms: Overwhelmed, anxious, angry, sad, guilty, irritable, hopeless, exhausted but can't sleep You are NOT alone. You are NOT to blame. With help, you WILL be well. Where can I find help? Medical professionals such as your OB, midwife, gynecologist, family practitioner, primary care provider, pediatrician, or mental health providers; [facility] support groups: Feelings After Birth, Breastfeeding Support Group, Baby and Me Group, and Fit 4 Two exercise classes. You have permission to ask for help. It will confirm your feelings, validate your experiences, share/learn coping strategies, and gain support and encouragement as you heal. You are important! BRAINSTORM Make a list of local resources, including resources for mom and for partner. Identify support groups. Identify people to call late at night - include names and contact info. Talk with partner about perinatal mood and anxiety disorders. Talk with your OB, midwife, and doula about baby blues and about perinatal mood and anxiety disorders. Talk with your pediatrician about perinatal mood and anxiety disorders.   Support & Sanity Savers   What do you really need?  Basics In preparing for a new baby, many expectant parents spend hours shopping for baby clothes, decorating the nursery, and deciding which car seat to buy. Yet most don't think much about what the reality of parenting a newborn will be like, and what they need to make it through that. So, here is the advice of experienced parents. We know you'll read this, and think "they're exaggerating, I don't really need that." Just trust [place] on these, OK? Plan for all of this, and if it turns out you don't  need it, come back and teach [place] how you did it!  Must-Haves (Once baby's survival needs are met, make sure you attend to your own survival needs!) Sleep An average newborn sleeps 16-18 hours per day, over 6-7 sleep periods, rarely more than three hours at a time. It is normal and healthy for a newborn to wake throughout the night... but really hard on parents!! Naps. Prioritize sleep above any responsibilities like: cleaning house, visiting friends, running errands, etc.  Sleep whenever baby sleeps. If you can't nap, at least have restful times when  baby eats. The more rest you get, the more patient you will be, the more emotionally stable, and better at solving problems.  Food You may not have realized it would be difficult to eat when you have a newborn. Yet, when we talk to countless new parents, they say things like "it may be 2:00 pm when I realize I haven't had breakfast yet." Or "every time we sit down to dinner, baby needs to eat, and my food gets cold, so I don't bother to eat it." Finger food. Before your baby is born, stock up with one months' worth of food that: 1) you can eat with one hand while holding a baby, 2) doesn't need to be prepped, 3) is good hot or cold, 4) doesn't spoil when left out for a few hours, and 5) you like to eat. Think about: nuts, dried fruit, Clif bars, pretzels, jerky, gogurt, baby carrots, apples, bananas, crackers, cheez-n-crackers, string cheese, hot pockets or frozen burritos to microwave, garden burgers and breakfast pastries to put in the toaster, yogurt drinks, etc. Restaurant Menus. Make lists of your favorite restaurants & menu items. When family/friends want to help, you can give specific information without much thought. They can either bring you the food or send gift cards for just the right meals. Freezer Meals.  Take some time to make a few meals to put in the freezer ahead of time.  Easy to freeze meals can be anything such as soup, lasagna, chicken  pie, or spaghetti sauce. Set up a Meal Schedule.  Ask friends and family to sign up to bring you meals during the first few weeks of being home. (It can be passed around at baby showers!) You have no idea how helpful this will be until you are in the throes of parenting.  https://[email or web address]-woodard.com/ is a great website to check out. Emotional Support Know who to call when you're stressed out. Parenting a newborn is very challenging work. There are times when it totally overwhelms your normal coping abilities. EVERY NEW PARENT NEEDS TO HAVE A PLAN FOR WHO TO CALL WHEN THEY JUST CAN'T COPE ANY MORE. (And it has to be someone other than the baby's other parent!) Before your baby is born, come up with at least one person you can call for support - write their phone number down and post it on the refrigerator. Anxiety & Sadness. Baby blues are normal after pregnancy; however, there are more severe types of anxiety & sadness which can occur and should not be ignored.  They are always treatable, but you have to take the first step by reaching out for help. [facility] offers a "Mom Talk" group which meets every Tuesday from 10 am - 11 am.  This group is for new moms who need support and connection after their babies are born.  Call [phone].  Really, Really Helpful (Plan for them! Make sure these happen often!!) Physical Support with Taking Care of Yourselves Asking friends and family. Before your baby is born, set up a schedule of people who can come and visit and help out (or ask a friend to schedule for you). Any time someone says "let me know what I can do to help," sign them up for a day. When they get there, their job is not to take care of the baby (that's your job and your joy). Their job is to take care of you!  Postpartum doulas. If you don't have anyone you can call on for support, look  into postpartum doulas:  professionals at helping parents with caring for baby, caring for themselves, getting  breastfeeding started, and helping with household tasks. www.padanc.org is a helpful website for learning about doulas in our area. Peer Support / Parent Groups Why: One of the greatest ideas for new parents is to be around other new parents. Parent groups give you a chance to share and listen to others who are going through the same season of life, get a sense of what is normal infant development by watching several babies learn and grow, share your stories of triumph and struggles with empathetic ears, and forgive your own mistakes when you realize all parents are learning by trial and error. Where to find: There are many places you can meet other new parents throughout our community.  [facility] offers the following classes for new moms and their little ones:  Baby and Me (Birth to [place]) and Breastfeeding Support Group. Go to www.conehealthybaby.com or call [phone] for more information. Time for your Relationship It's easy to get so caught up in meeting baby's immediate needs that it's hard to find time to connect with your partner, and meet the needs of your relationship. It's also easy to forget what "quality time with your partner" actually looks like. If you take your baby on a date, you'd be amazed how much of your couple time is spent feeding the baby, diapering the baby, admiring the baby, and talking about the baby. Dating: Try to take time for just the two of you. Babysitter tip: Sometimes when moms are breastfeeding a newborn, they find it hard to figure out how to schedule outings around baby's unpredictable feeding schedules. Have the babysitter come for a three hour period. When she comes over, if baby has just eaten, you can leave right away, and come back in two hours. If baby hasn't fed recently, you start the date at home. Once baby gets hungry and gets a good feeding in, you can head out for the rest of your date time. Date Nights at Home: If you can't get out, at least set  aside one evening a week to prioritize your relationship: whenever baby dozes off or doesn't have any immediate needs, spend a little time focusing on each other. Potential conflicts: The main relationship conflicts that come up for new parents are: issues related to sexuality, financial stresses, a feeling of an unfair division of household tasks, and conflicts in parenting styles. The more you can work on these issues before baby arrives, the better!  Fun and Frills (Don't forget these. and don't feel guilty for indulging in them!) Everyone has something in life that is a fun little treat that they do just for themselves. It may be: reading the morning paper, or going for a daily jog, or having coffee with a friend once a week, or going to a movie on Friday nights, or fine chocolates, or bubble baths, or curling up with a good book. Unless you do fun things for yourself every now and then, it's hard to have the energy for fun with your baby. Whatever your "special" treats are, make sure you find a way to continue to indulge in them after your baby is born. These special moments can recharge you, and allow you to return to baby with a new joy   PERINATAL MOOD DISORDERS: [place]   Emergency and Crisis Resources:  If you are an imminent risk to self or  others, are experiencing intense personal distress, and/or have noticed significant changes in activities of daily living, call:  [facility]: [place]: [place]: [phone] Or visit the following crisis centers: Local Emergency Departments [facility]: [address], [place]. Hours: 8:30AM-5PM. Insurance Accepted: Medicaid, Medicare, and Uninsured.  [facility]  [address] [place] Mon-Friday 8am-3pm  [phone]                                                                                     Non-Crisis Resources: To identify specific providers that are covered by your insurance, contact your insurance company or local agencies: [place]: [phone] [facility] and [place]: [phone] [place]: [phone] Postpartum Support International- [place] [phone]                                                      Outpatient therapy and medication management providers:  [facility] [phone] Hours: 9AM-5PM  Insurance Accepted: Alben Spittle, Lorella Nimrod, Freddrick March, [place], Medicare  [facility] ([place]) [phone] Hours: 8AM-5PM  nsurance Accepted: All insurances EXCEPT AARP, [place], [place], and [place]: [phone]             Hours: 8AM-8PM Insurance Accepted: Cristal Ford, Freddrick March, [place], Medicare, [phone] [facility]: [phone] Hours: 8:30AM-7PM Insurance Accepted: Cristal Ford, Medicaid, Medicare, Tricare, [employer] Counseling:  [place] Accepted:  Holland Falling, Lorella Nimrod, [facility], [place], [facility] [phone] Hours: 9AM-5:30PM Insurance Accepted: Alben Spittle, Charlotte Crumb, and Medicaid, Medicare, [employer] [facility] Counseling:  [phone] Hours: 9am-5pm Insurance Accepted: BCBS; they do not accept Medicaid/Medicare [place]: [phone] Hours: 9am-9pm Insurance Accepted: All major insurance including Medicaid and Medicare [facility]: [phone] Hours: 9AM-5:30PM Insurance Accepted: All insurances EXCEPT Medicaid and Medicare. [facility]: [place]:  [phone] [place]:  [place] (support for children in the NICU and/or with special needs), [phone]  Mental Health Support Groups Mental Health Association: [phone]                                                                                     Online Resources: Postpartum Support International: http://jones-berg.com/  841-282-0SHN 2Moms Supporting Moms:  www.momssupportingmoms.net

## 2020-08-30 NOTE — BH Specialist Note (Signed)
Integrated Behavioral Health via Telemedicine Visit  08/30/2020 [name] [name] [id number]  Number of [place] visits: 2 Session Start time: 10:53  Session End time: 11:12 Total time:  47  Referring Provider: Lynnda Shields, MD Patient/Family location: Home [facility] Provider location: [facility]  All persons participating in visit: [name] and [name] [name]   Types of Service: Telephone visit  I connected with [name] [name] and/or [name] [name]  via  Telephone or [employer]  (Video is Caregility application) and verified that I am speaking with the correct person using two identifiers. Discussed confidentiality: Yes   I discussed the limitations of telemedicine and the availability of in person appointments.  Discussed there is a possibility of technology failure and discussed alternative modes of communication if that failure occurs.  I discussed that engaging in this telemedicine visit, they consent to the provision of behavioral healthcare and the services will be billed under their insurance.  Patient and/or legal guardian expressed understanding and consented to Telemedicine visit: Yes   Presenting Concerns: Patient and/or family reports the following symptoms/concerns: Pt states her primary concern today is missing appointments due to positive covid; is testing negative now; pt feels well-prepared for baby's arrival, mildly anxious about childbirth.  Duration of problem: Current pregnancy; Severity of problem: mild  Patient and/or Family's Strengths/Protective Factors: Sense of purpose  Goals Addressed: Patient will:  Reduce symptoms of: anxiety and stress   Demonstrate ability to: Increase healthy adjustment to current life circumstances  Progress towards Goals: Ongoing  Interventions: Interventions  utilized:  Psychoeducation and/or Health Education and Supportive Reflection Standardized Assessments completed: Not Needed  Patient and/or Family Response: Pt agrees to treatment plan  Assessment: Patient currently experiencing Psychosocial stress.   Patient may benefit from continued psychoeducation and brief therapeutic interventions regarding coping with symptoms of stress and mild anxiety .  Plan: Follow up with behavioral health clinician on : Call [name] [name] as needed at [phone] Behavioral recommendations:  -Continue taking prenatal vitamin daily -Continue prioritizing healthy sleeping and eating in pregnancy -Consider registering for childbirth education class, as needed, at [email or web address]  -Pick one or two support persons for labor/delivery Referral(s): [place] (In Clinic)  I discussed the assessment and treatment plan with the patient and/or parent/guardian. They were provided an opportunity to ask questions and all were answered. They agreed with the plan and demonstrated an understanding of the instructions.   They were advised to call back or seek an in-person evaluation if the symptoms worsen or if the condition fails to improve as anticipated.  [name] Fair, LCSW  Depression screen [facility] 2/9 08/27/2020 08/13/2020 07/02/2020 05/21/2020 03/23/2020  Decreased Interest 0 0 0 0 0  Down, Depressed, Hopeless 0 0 0 0 0  PHQ - 2 Score 0 0 0 0 0  Altered sleeping 0 0 1 0 0  Tired, decreased energy 0 0 0 0 0  Change in appetite 0 0 1 0 0  Feeling bad or failure about yourself  0 0 0 0 0  Trouble concentrating 0 0 0 0 0  Moving slowly or fidgety/restless 0 0 0 0 0  Suicidal thoughts 0 0 0 0 0  PHQ-9 Score 0 0 2 0 0  Difficult doing work/chores - - Not difficult at all - -   GAD 7 : Generalized Anxiety Score 08/27/2020 08/13/2020 07/02/2020 05/21/2020  Nervous,  Anxious, on Edge 0 0 1 0  Control/stop worrying 0 0 0 0  Worry too much -  different things 0 0 0 0  Trouble relaxing 0 0 0 0  Restless 0 0 0 0  Easily annoyed or irritable 0 0 1 0  Afraid - awful might happen 0 0 0 0  Total GAD 7 Score 0 0 2 0

## 2020-09-05 ENCOUNTER — Telehealth: Payer: Self-pay | Admitting: Family Medicine

## 2020-09-05 ENCOUNTER — Ambulatory Visit: Payer: Self-pay | Admitting: *Deleted

## 2020-09-05 NOTE — Telephone Encounter (Signed)
Pt states she is [redacted] weeks pregnant. Boyfriend tested positive covid. Pt tested today at [facility], results pending. Pt reports "Allergy type symptoms" cough, clear mucous, yellow in AM. States she called OB/GYN 45 minutes to an hour ago, has not heard back, called community line. Advised OB/GYN best resource, will have someone on call. Advised may need to give them more time to CB. Advised to call OB/GYN, call us back if needed.

## 2020-09-05 NOTE — Telephone Encounter (Signed)
Patient has a scheduled appointment with [place] on Friday, Her boyfriend tested positive for Covid, she has not yet took a test, she is concerned because she don't want her baby to be at harm, can someone call her. She also state she is not able to do a mychart visit

## 2020-09-05 NOTE — Telephone Encounter (Signed)
Reason for Disposition . [1] HIGH RISK for severe COVID complications (e.g., weak immune system, age > 3 years, obesity with BMI > 25, pregnant, chronic lung disease or other chronic medical condition) AND [2] COVID symptoms (e.g., cough, fever)  (Exceptions: Already seen by PCP and no new or worsening symptoms.)  Protocols used: Coronavirus (COVID-19) Diagnosed or Suspected-A-AH

## 2020-09-06 NOTE — Telephone Encounter (Signed)
Called pt; pt reports taking COVID test that resulted positive. Pt is tearful and is very concerned for baby. PT is upset that she has received COVID vaccine and has still contracted the virus. Reviewed recommendation for receiving COVID vaccine even if this does not mean you are completely immune to virus. Reviewed that it is best to treat your symptoms and monitor yourself at home. Encouraged pt to go to the MAU with any difficulty breathing, pain, vaginal bleeding, or decreased fetal movement. Recommended extra strength Tylenol every 6 hours for fever. Mucinex for sinus congestion. Pt states her mother can pick up medication from pharmacy. Pt states she will follow up with any concerns.

## 2020-09-10 ENCOUNTER — Encounter: Payer: Self-pay | Admitting: Obstetrics and Gynecology

## 2020-09-10 DIAGNOSIS — S73192A Other sprain of left hip, initial encounter: Secondary | ICD-10-CM | POA: Insufficient documentation

## 2020-09-11 ENCOUNTER — Encounter: Payer: Medicaid Other | Admitting: Family Medicine

## 2020-09-11 ENCOUNTER — Encounter: Payer: Self-pay | Admitting: Family Medicine

## 2020-09-11 NOTE — Progress Notes (Signed)
Patient did not keep appointment today. She will be called to reschedule.  

## 2020-09-12 ENCOUNTER — Ambulatory Visit (INDEPENDENT_AMBULATORY_CARE_PROVIDER_SITE_OTHER): Payer: Self-pay | Admitting: Clinical

## 2020-09-12 DIAGNOSIS — Z658 Other specified problems related to psychosocial circumstances: Secondary | ICD-10-CM

## 2020-09-18 ENCOUNTER — Other Ambulatory Visit: Payer: Self-pay

## 2020-09-18 ENCOUNTER — Ambulatory Visit (INDEPENDENT_AMBULATORY_CARE_PROVIDER_SITE_OTHER): Payer: Medicaid Other | Admitting: Student

## 2020-09-18 ENCOUNTER — Other Ambulatory Visit (HOSPITAL_COMMUNITY)
Admission: RE | Admit: 2020-09-18 | Discharge: 2020-09-18 | Disposition: A | Discharge status: Home / Self Care | Payer: Medicaid Other | Source: Ambulatory Visit | Attending: Student | Admitting: Student

## 2020-09-18 VITALS — BP 105/73 | HR 93

## 2020-09-18 DIAGNOSIS — O0990 Supervision of high risk pregnancy, unspecified, unspecified trimester: Secondary | ICD-10-CM | POA: Insufficient documentation

## 2020-09-18 DIAGNOSIS — Z3A36 36 weeks gestation of pregnancy: Secondary | ICD-10-CM

## 2020-09-18 NOTE — Progress Notes (Signed)
   PRENATAL VISIT NOTE  Subjective:  [name] [name] is a 35 y.o. G1P0000 at [redacted]w[redacted]d being seen today for ongoing prenatal care.  She is currently monitored for the following issues for this high-risk pregnancy and has Hypomenorrhea/oligomenorrhea; Polysubstance abuse ([place]); Bipolar disorder, curr episode mixed, severe, with psychotic features ([place]); Cannabis use disorder, severe, dependence ([place]); Supervision of high risk pregnancy, antepartum; AMA (advanced maternal age) multigravida 3+; Tobacco use affecting pregnancy, antepartum; Hip instability, left; Increased SMA carrier risk on Horizon testing; and Labral tear of left hip joint on their problem list.  Patient appears distressed. She is crying with head down. SHe is only nodding yes or no to questions; states that she wants to be checked for "everything" but is not forthcoming about timing of STI exposure nor circumstances. Does not want to talk about it.   Contractions: Not present. Vag. Bleeding: None.  Movement: Present. Denies leaking of fluid.   The following portions of the patient's history were reviewed and updated as appropriate: allergies, current medications, past family history, past medical history, past social history, past surgical history and problem list.   Objective:   Vitals:   09/18/20 1500  BP: 105/73  Pulse: 93    Fetal Status: Fetal Heart Rate (bpm): 140 Fundal Height: 37 cm Movement: Present  Presentation: Vertex  General:  Alert, oriented and cooperative. Patient is in no acute distress.  Skin: Skin is warm and dry. No rash noted.   Cardiovascular: Normal heart rate noted  Respiratory: Normal respiratory effort, no problems with respiration noted  Abdomen: Soft, gravid, appropriate for gestational age.  Pain/Pressure: Absent     Pelvic: Cervical exam deferred Dilation: Closed Effacement (%): Thick Station: Ballotable  Extremities: Normal range of motion.  Edema: None  Mental Status: Normal mood and  affect. Normal behavior. Normal judgment and thought content.   Assessment and Plan:  Pregnancy: G1P0000 at [redacted]w[redacted]d 1. Supervision of high risk pregnancy, antepartum -will do all STI testing today -Discussed reasons for the [facility] CT/GBS -cervix checked; patient is vertex by [place] -discussed birth control, patient is undecided.  - HIV Antibody (routine testing w rflx) - RPR - Cervicovaginal ancillary only( [place]) - Culture, beta strep (group b only) - Hepatitis B Surface AntiGEN - Hepatitis C Antibody  Preterm labor symptoms and general obstetric precautions including but not limited to vaginal bleeding, contractions, leaking of fluid and fetal movement were reviewed in detail with the patient. Please refer to After Visit Summary for other counseling recommendations.   Return in about 1 week (around 09/25/2020), or HROB with MD.  Future Appointments  Date Time Provider [place]  09/27/2020  9:25 AM Donnamae Jude, MD [facility]    [place], CNM

## 2020-09-19 LAB — HEPATITIS B SURFACE ANTIGEN: Hepatitis B Surface Ag: NEGATIVE

## 2020-09-19 LAB — CERVICOVAGINAL ANCILLARY ONLY
Bacterial Vaginitis (gardnerella): NEGATIVE
Candida Glabrata: NEGATIVE
Candida Vaginitis: NEGATIVE
Chlamydia: NEGATIVE
Comment: NEGATIVE
Comment: NEGATIVE
Comment: NEGATIVE
Comment: NEGATIVE
Comment: NEGATIVE
Comment: NORMAL
Neisseria Gonorrhea: NEGATIVE
Trichomonas: NEGATIVE

## 2020-09-19 LAB — HIV ANTIBODY (ROUTINE TESTING W REFLEX): HIV Screen 4th Generation wRfx: NONREACTIVE

## 2020-09-19 LAB — HEPATITIS C ANTIBODY: Hep C Virus Ab: <0.1 s/co ratio (ref 0.0–0.9)

## 2020-09-19 LAB — RPR: RPR Ser Ql: NONREACTIVE

## 2020-09-22 LAB — CULTURE, BETA STREP (GROUP B ONLY): Strep Gp B Culture: NEGATIVE

## 2020-09-27 ENCOUNTER — Ambulatory Visit (INDEPENDENT_AMBULATORY_CARE_PROVIDER_SITE_OTHER): Payer: Medicaid Other | Admitting: Family Medicine

## 2020-09-27 ENCOUNTER — Other Ambulatory Visit: Payer: Self-pay

## 2020-09-27 VITALS — BP 115/70 | HR 97 | Wt 239.6 lb

## 2020-09-27 DIAGNOSIS — O99330 Smoking (tobacco) complicating pregnancy, unspecified trimester: Secondary | ICD-10-CM

## 2020-09-27 DIAGNOSIS — O0990 Supervision of high risk pregnancy, unspecified, unspecified trimester: Secondary | ICD-10-CM

## 2020-09-27 DIAGNOSIS — S73192D Other sprain of left hip, subsequent encounter: Secondary | ICD-10-CM

## 2020-09-27 DIAGNOSIS — O09523 Supervision of elderly multigravida, third trimester: Secondary | ICD-10-CM

## 2020-09-27 DIAGNOSIS — Z3A37 37 weeks gestation of pregnancy: Secondary | ICD-10-CM

## 2020-09-27 NOTE — Progress Notes (Signed)
   PRENATAL VISIT NOTE  Subjective:  [name] [name] is a 35 y.o. G1P0000 at 78w6dbeing seen today for ongoing prenatal care.  She is currently monitored for the following issues for this high-risk pregnancy and has Hypomenorrhea/oligomenorrhea; Polysubstance abuse ([place]; Bipolar disorder, curr episode mixed, severe, with psychotic features ([place]; Cannabis use disorder, severe, dependence ([place]; Supervision of high risk pregnancy, antepartum; AMA (advanced maternal age) multigravida 368+ Tobacco use affecting pregnancy, antepartum; Hip instability, left; Increased SMA carrier risk on Horizon testing; and Labral tear of left hip joint on their problem list.  Patient reports no complaints.  Contractions: Irritability. Vag. Bleeding: None.  Movement: Present. Denies leaking of fluid.   The following portions of the patient's history were reviewed and updated as appropriate: allergies, current medications, past family history, past medical history, past social history, past surgical history and problem list.   Objective:   Vitals:   09/27/20 0909  BP: 115/70  Pulse: 97  Weight: 239 lb 9.6 oz (108.7 kg)    Fetal Status: Fetal Heart Rate (bpm): 130 Fundal Height: 37 cm Movement: Present  Presentation: Vertex  General:  Alert, oriented and cooperative. Patient is in no acute distress.  Skin: Skin is warm and dry. No rash noted.   Cardiovascular: Normal heart rate noted  Respiratory: Normal respiratory effort, no problems with respiration noted  Abdomen: Soft, gravid, appropriate for gestational age.  Pain/Pressure: Present     Pelvic: Cervical exam deferred        Extremities: Normal range of motion.  Edema: None  Mental Status: Normal mood and affect. Normal behavior. Normal judgment and thought content.   Assessment and Plan:  Pregnancy: G1P0000 at 328w6d. Supervision of high risk pregnancy, antepartum Continue prenatal care. GBS negative.  2. Tear of left acetabular  labrum, subsequent encounter OK for TOL  3. Multigravida of advanced maternal age in third trimester For IOL at 3930 weeks Labor orders placed  4. Tobacco use affecting pregnancy, antepartum   Term labor symptoms and general obstetric precautions including but not limited to vaginal bleeding, contractions, leaking of fluid and fetal movement were reviewed in detail with the patient. Please refer to After Visit Summary for other counseling recommendations.   [facility] in 1 week (on 10/04/2020) [facility].  Future Appointments  Date Time Provider [place]8/03/2020 10:55 AM PrDonnamae JudeMD [facility]/08/2020 12:00 AM [facility] SCHED ROOM [facility]-INDC None    TaDonnamae JudeMD

## 2020-09-27 NOTE — Patient Instructions (Signed)
[email or web address].[email or web address]">  Third Trimester of Pregnancy  The third trimester of pregnancy is from week 28 through week 40. This is months 7 through 9. The third trimester is a time when the unborn baby (fetus) is growing rapidly. At the end of the ninth month, the fetus is about 20inches long and weighs 6-10 pounds. Body changes during your third trimester During the third trimester, your body will continue to go through many changes.The changes vary and generally return to normal after your baby is born. Physical changes Your weight will continue to increase. You can expect to gain 25-35 pounds (11-16 kg) by the end of the pregnancy if you begin pregnancy at a normal weight. If you are underweight, you can expect to gain 28-40 lb (about 13-18 kg), and if you are overweight, you can expect to gain 15-25 lb (about 7-11 kg). You may begin to get stretch marks on your hips, abdomen, and breasts. Your breasts will continue to grow and may hurt. A yellow fluid (colostrum) may leak from your breasts. This is the first milk you are producing for your baby. You may have changes in your hair. These can include thickening of your hair, rapid growth, and changes in texture. Some people also have hair loss during or after pregnancy, or hair that feels dry or thin. Your belly button may stick out. You may notice more swelling in your hands, face, or ankles. Health changes You may have heartburn. You may have constipation. You may develop hemorrhoids. You may develop swollen, bulging veins (varicose veins) in your legs. You may have increased body aches in the pelvis, back, or thighs. This is due to weight gain and increased hormones that are relaxing your joints. You may have increased tingling or numbness in your hands, arms, and legs. The skin on your abdomen may also feel numb. You may feel short of breath because of your expanding uterus. Other  changes You may urinate more often because the fetus is moving lower into your pelvis and pressing on your bladder. You may have more problems sleeping. This may be caused by the size of your abdomen, an increased need to urinate, and an increase in your body's metabolism. You may notice the fetus "dropping," or moving lower in your abdomen (lightening). You may have increased vaginal discharge. You may notice that you have pain around your pelvic bone as your uterus distends. Follow these instructions at home: Medicines Follow your health care provider's instructions regarding medicine use. Specific medicines may be either safe or unsafe to take during pregnancy. Do not take any medicines unless approved by your health care provider. Take a prenatal vitamin that contains at least 600 micrograms (mcg) of folic acid. Eating and drinking Eat a healthy diet that includes fresh fruits and vegetables, whole grains, good sources of protein such as meat, eggs, or tofu, and low-fat dairy products. Avoid raw meat and unpasteurized juice, milk, and cheese. These carry germs that can harm you and your baby. Eat 4 or 5 small meals rather than 3 large meals a day. You may need to take these actions to prevent or treat constipation: Drink enough fluid to keep your urine pale yellow. Eat foods that are high in fiber, such as beans, whole grains, and fresh fruits and vegetables. Limit foods that are high in fat and processed sugars, such as fried or sweet foods. Activity Exercise only as directed by your health care provider. Most people can continue their usual exercise routine during pregnancy. Try  to exercise for 30 minutes at least 5 days a week. Stop exercising if you experience contractions in the uterus. Stop exercising if you develop pain or cramping in the lower abdomen or lower back. Avoid heavy lifting. Do not exercise if it is very hot or humid or if you are at a high altitude. If you choose to,  you may continue to have sex unless your health care provider tells you not to. Relieving pain and discomfort Take frequent breaks and rest with your legs raised (elevated) if you have leg cramps or low back pain. Take warm sitz baths to soothe any pain or discomfort caused by hemorrhoids. Use hemorrhoid cream if your health care provider approves. Wear a supportive bra to prevent discomfort from breast tenderness. If you develop varicose veins: Wear support hose as told by your health care provider. Elevate your feet for 15 minutes, 3-4 times a day. Limit salt in your diet. Safety Talk to your health care provider before traveling far distances. Do not use hot tubs, steam rooms, or saunas. Wear your seat belt at all times when driving or riding in a car. Talk with your health care provider if someone is verbally or physically abusive to you. Preparing for birth To prepare for the arrival of your baby: Take prenatal classes to understand, practice, and ask questions about labor and delivery. Visit the hospital and tour the maternity area. Purchase a rear-facing car seat and make sure you know how to install it in your car. Prepare the baby's room or sleeping area. Make sure to remove all pillows and stuffed animals from the baby's crib to prevent suffocation. General instructions Avoid cat litter boxes and soil used by cats. These carry germs that can cause birth defects in the baby. If you have a cat, ask someone to clean the litter box for you. Do not douche or use tampons. Do not use scented sanitary pads. Do not use any products that contain nicotine or tobacco, such as cigarettes, e-cigarettes, and chewing tobacco. If you need help quitting, ask your health care provider. Do not use any herbal remedies, illegal drugs, or medicines that were not prescribed to you. Chemicals in these products can harm your baby. Do not drink alcohol. You will have more frequent prenatal exams during the  third trimester. During a routine prenatal visit, your health care provider will do a physical exam, perform tests, and discuss your overall health. Keep all follow-up visits. This is important. Where to find more information American Pregnancy Association: americanpregnancy.[place] and Gynecologists: PoolDevices.com.pt Office on [employer] Health: KeywordPortfolios.com.br Contact a health care provider if you have: A fever. Mild pelvic cramps, pelvic pressure, or nagging pain in your abdominal area or lower back. Vomiting or diarrhea. Bad-smelling vaginal discharge or foul-smelling urine. Pain when you urinate. A headache that does not go away when you take medicine. Visual changes or see spots in front of your eyes. Get help right away if: Your water breaks. You have regular contractions less than 5 minutes apart. You have spotting or bleeding from your vagina. You have severe abdominal pain. You have difficulty breathing. You have chest pain. You have fainting spells. You have not felt your baby move for the time period told by your health care provider. You have new or increased pain, swelling, or redness in an arm or leg. Summary The third trimester of pregnancy is from week 28 through week 40 (months 7 through 9). You may have more problems  sleeping. This can be caused by the size of your abdomen, an increased need to urinate, and an increase in your body's metabolism. You will have more frequent prenatal exams during the third trimester. Keep all follow-up visits. This is important. This information is not intended to replace advice given to you by your health care provider. Make sure you discuss any questions you have with your healthcare provider. Document Revised: 07/27/2019 Document Reviewed: 06/02/2019 Elsevier Patient Education  2022 [employer].

## 2020-09-28 ENCOUNTER — Telehealth (HOSPITAL_COMMUNITY): Payer: Self-pay | Admitting: *Deleted

## 2020-09-28 ENCOUNTER — Encounter (HOSPITAL_COMMUNITY): Payer: Self-pay | Admitting: *Deleted

## 2020-09-28 NOTE — Telephone Encounter (Signed)
Preadmission screen  

## 2020-10-01 ENCOUNTER — Other Ambulatory Visit: Payer: Self-pay

## 2020-10-01 ENCOUNTER — Ambulatory Visit (INDEPENDENT_AMBULATORY_CARE_PROVIDER_SITE_OTHER): Payer: Medicaid Other | Admitting: Family Medicine

## 2020-10-01 VITALS — BP 109/79 | HR 94 | Wt 241.0 lb

## 2020-10-01 DIAGNOSIS — O0990 Supervision of high risk pregnancy, unspecified, unspecified trimester: Secondary | ICD-10-CM

## 2020-10-01 DIAGNOSIS — O09523 Supervision of elderly multigravida, third trimester: Secondary | ICD-10-CM

## 2020-10-01 DIAGNOSIS — O99330 Smoking (tobacco) complicating pregnancy, unspecified trimester: Secondary | ICD-10-CM

## 2020-10-01 NOTE — Patient Instructions (Signed)
[email or web address]">  Third Trimester of Pregnancy  The third trimester of pregnancy is from week 28 through week 40. This is months 7 through 9. The third trimester is a time when the unborn baby (fetus) is growing rapidly. At the end of the ninth month, the fetus is about 20inches long and weighs 6-10 pounds. Body changes during your third trimester During the third trimester, your body will continue to go through many changes.The changes vary and generally return to normal after your baby is born. Physical changes Your weight will continue to increase. You can expect to gain 25-35 pounds (11-16 kg) by the end of the pregnancy if you begin pregnancy at a normal weight. If you are underweight, you can expect to gain 28-40 lb (about 13-18 kg), and if you are overweight, you can expect to gain 15-25 lb (about 7-11 kg). You may begin to get stretch marks on your hips, abdomen, and breasts. Your breasts will continue to grow and may hurt. A yellow fluid (colostrum) may leak from your breasts. This is the first milk you are producing for your baby. You may have changes in your hair. These can include thickening of your hair, rapid growth, and changes in texture. Some people also have hair loss during or after pregnancy, or hair that feels dry or thin. Your belly button may stick out. You may notice more swelling in your hands, face, or ankles. Health changes You may have heartburn. You may have constipation. You may develop hemorrhoids. You may develop swollen, bulging veins (varicose veins) in your legs. You may have increased body aches in the pelvis, back, or thighs. This is due to weight gain and increased hormones that are relaxing your joints. You may have increased tingling or numbness in your hands, arms, and legs. The skin on your abdomen may also feel numb. You may feel short of breath because of your expanding uterus. Other  changes You may urinate more often because the fetus is moving lower into your pelvis and pressing on your bladder. You may have more problems sleeping. This may be caused by the size of your abdomen, an increased need to urinate, and an increase in your body's metabolism. You may notice the fetus "dropping," or moving lower in your abdomen (lightening). You may have increased vaginal discharge. You may notice that you have pain around your pelvic bone as your uterus distends. Follow these instructions at home: Medicines Follow your health care provider's instructions regarding medicine use. Specific medicines may be either safe or unsafe to take during pregnancy. Do not take any medicines unless approved by your health care provider. Take a prenatal vitamin that contains at least 600 micrograms (mcg) of folic acid. Eating and drinking Eat a healthy diet that includes fresh fruits and vegetables, whole grains, good sources of protein such as meat, eggs, or tofu, and low-fat dairy products. Avoid raw meat and unpasteurized juice, milk, and cheese. These carry germs that can harm you and your baby. Eat 4 or 5 small meals rather than 3 large meals a day. You may need to take these actions to prevent or treat constipation: Drink enough fluid to keep your urine pale yellow. Eat foods that are high in fiber, such as beans, whole grains, and fresh fruits and vegetables. Limit foods that are high in fat and processed sugars, such as fried or sweet foods. Activity Exercise only as directed by your health care provider. Most people can continue their usual exercise routine during pregnancy. Try  to exercise for 30 minutes at least 5 days a week. Stop exercising if you experience contractions in the uterus. Stop exercising if you develop pain or cramping in the lower abdomen or lower back. Avoid heavy lifting. Do not exercise if it is very hot or humid or if you are at a high altitude. If you choose to,  you may continue to have sex unless your health care provider tells you not to. Relieving pain and discomfort Take frequent breaks and rest with your legs raised (elevated) if you have leg cramps or low back pain. Take warm sitz baths to soothe any pain or discomfort caused by hemorrhoids. Use hemorrhoid cream if your health care provider approves. Wear a supportive bra to prevent discomfort from breast tenderness. If you develop varicose veins: Wear support hose as told by your health care provider. Elevate your feet for 15 minutes, 3-4 times a day. Limit salt in your diet. Safety Talk to your health care provider before traveling far distances. Do not use hot tubs, steam rooms, or saunas. Wear your seat belt at all times when driving or riding in a car. Talk with your health care provider if someone is verbally or physically abusive to you. Preparing for birth To prepare for the arrival of your baby: Take prenatal classes to understand, practice, and ask questions about labor and delivery. Visit the hospital and tour the maternity area. Purchase a rear-facing car seat and make sure you know how to install it in your car. Prepare the baby's room or sleeping area. Make sure to remove all pillows and stuffed animals from the baby's crib to prevent suffocation. General instructions Avoid cat litter boxes and soil used by cats. These carry germs that can cause birth defects in the baby. If you have a cat, ask someone to clean the litter box for you. Do not douche or use tampons. Do not use scented sanitary pads. Do not use any products that contain nicotine or tobacco, such as cigarettes, e-cigarettes, and chewing tobacco. If you need help quitting, ask your health care provider. Do not use any herbal remedies, illegal drugs, or medicines that were not prescribed to you. Chemicals in these products can harm your baby. Do not drink alcohol. You will have more frequent prenatal exams during the  third trimester. During a routine prenatal visit, your health care provider will do a physical exam, perform tests, and discuss your overall health. Keep all follow-up visits. This is important. Where to find more information American Pregnancy Association: americanpregnancy.[place] and Gynecologists: PoolDevices.com.pt Office on [employer]: KeywordPortfolios.com.br Contact a health care provider if you have: A fever. Mild pelvic cramps, pelvic pressure, or nagging pain in your abdominal area or lower back. Vomiting or diarrhea. Bad-smelling vaginal discharge or foul-smelling urine. Pain when you urinate. A headache that does not go away when you take medicine. Visual changes or see spots in front of your eyes. Get help right away if: Your water breaks. You have regular contractions less than 5 minutes apart. You have spotting or bleeding from your vagina. You have severe abdominal pain. You have difficulty breathing. You have chest pain. You have fainting spells. You have not felt your baby move for the time period told by your health care provider. You have new or increased pain, swelling, or redness in an arm or leg. Summary The third trimester of pregnancy is from week 28 through week 40 (months 7 through 9). You may have more problems  sleeping. This can be caused by the size of your abdomen, an increased need to urinate, and an increase in your body's metabolism. You will have more frequent prenatal exams during the third trimester. Keep all follow-up visits. This is important. This information is not intended to replace advice given to you by your health care provider. Make sure you discuss any questions you have with your healthcare provider. Document Revised: 07/27/2019 Document Reviewed: 06/02/2019 Elsevier Patient Education  2022 [employer].

## 2020-10-02 ENCOUNTER — Other Ambulatory Visit: Payer: Self-pay | Admitting: Advanced Practice Midwife

## 2020-10-02 NOTE — Progress Notes (Signed)
   PRENATAL VISIT NOTE  Subjective:  [name] [name] is a 35 y.o. G1P0000 at 4w4dbeing seen today for ongoing prenatal care.  She is currently monitored for the following issues for this high-risk pregnancy and has Hypomenorrhea/oligomenorrhea; Polysubstance abuse ([place]; Bipolar disorder, curr episode mixed, severe, with psychotic features ([place]; Cannabis use disorder, severe, dependence ([place]; Supervision of high risk pregnancy, antepartum; AMA (advanced maternal age) multigravida 337+ Tobacco use affecting pregnancy, antepartum; Hip instability, left; Increased SMA carrier risk on Horizon testing; and Labral tear of left hip joint on their problem list.  Patient reports no complaints.  Contractions: Irritability. Vag. Bleeding: None.  Movement: Present. Denies leaking of fluid.   The following portions of the patient's history were reviewed and updated as appropriate: allergies, current medications, past family history, past medical history, past social history, past surgical history and problem list.   Objective:   Vitals:   10/01/20 1118  BP: 109/79  Pulse: 94  Weight: 241 lb (109.3 kg)    Fetal Status: Fetal Heart Rate (bpm): 132 Fundal Height: 38 cm Movement: Present     General:  Alert, oriented and cooperative. Patient is in no acute distress.  Skin: Skin is warm and dry. No rash noted.   Cardiovascular: Normal heart rate noted  Respiratory: Normal respiratory effort, no problems with respiration noted  Abdomen: Soft, gravid, appropriate for gestational age.  Pain/Pressure: Present     Pelvic: Cervical exam deferred        Extremities: Normal range of motion.  Edema: Trace  Mental Status: Normal mood and affect. Normal behavior. Normal judgment and thought content.   Assessment and Plan:  Pregnancy: G1P0000 at 364w4d. Multigravida of advanced maternal age in third trimester Low risk NIPT  2. Supervision of high risk pregnancy, antepartum For IOL at 39  wks  3. Tobacco use affecting pregnancy, antepartum   Term labor symptoms and general obstetric precautions including but not limited to vaginal bleeding, contractions, leaking of fluid and fetal movement were reviewed in detail with the patient. Please refer to After Visit Summary for other counseling recommendations.   Return in 4 weeks (on 10/29/2020) for pp check.  Future Appointments  Date Time Provider DeRodanthe8/08/2020 12:00 AM [facility] SCHED ROOM [facility] None  11/07/2020  1:15 PM PrDonnamae JudeMD [facility]  TaDonnamae JudeMD

## 2020-10-05 ENCOUNTER — Other Ambulatory Visit: Payer: Self-pay | Admitting: Family Medicine

## 2020-10-05 LAB — SARS CORONAVIRUS 2 (TAT 6-24 HRS): SARS Coronavirus 2: NEGATIVE

## 2020-10-06 ENCOUNTER — Other Ambulatory Visit: Payer: Self-pay

## 2020-10-06 NOTE — H&P (Signed)
[name] [name] is a 35 y.o. female, G1P0 at 39.2 weeks, presenting for elective IOL.  Patient receives care at [facility] and was supervised for a high risk pregnancy. Pregnancy and medical history significant for problems as listed below. She is GBS Negative and expresses a desire for epidural for pain management.  She is anticipating a female infant and plans to utilize condoms for PP birth control method.     Patient Active Problem List   Diagnosis Date Noted   Labral tear of left hip joint 09/10/2020   Increased SMA carrier risk on Horizon testing 08/06/2020   Hip instability, left 07/02/2020   Supervision of high risk pregnancy, antepartum 03/23/2020   AMA (advanced maternal age) multigravida 35+ 03/23/2020   Tobacco use affecting pregnancy, antepartum 03/23/2020   Bipolar disorder, curr episode mixed, severe, with psychotic features ([place]) 08/02/2014   Cannabis use disorder, severe, dependence ([place]) 08/02/2014   Polysubstance abuse ([place]) 05/12/2013   Hypomenorrhea/oligomenorrhea 02/26/2012    History of present pregnancy:  Last evaluation:  10/01/2020 in office by Dr. Fransico Meadow.   BP: 109/79  Pulse: 94  Weight: 241 lb (109.3 kg)     Nursing Staff Provider  Office Location  [facility] Dating  LMP = 8 wk u/s  Language  English Anatomy US  WNL--incomplete f/u 4 weeks  Flu Vaccine  declines Genetic Screen  NIPS:  Low risk, female  AFP:  Neg   TDaP Vaccine   declined tdap Hgb A1C or  GTT Early A1C 5.0 Third trimester  Glucose, Fasting 65 - 91 mg/dL 86   Glucose, 1 hour 65 - 179 mg/dL 148   Glucose, 2 hour 65 - 152 mg/dL 9 3   Resulting Agency       COVID Vaccine Jan/feb   LAB RESULTS   Rhogam  n/a Blood Type A/Positive/-- (01/21 1039)   Feeding Plan Breast pump Antibody Negative (01/21 1039)  Contraception Condoms  Rubella 1.21 (01/21 1039)  Circumcision GIRL  RPR Non Reactive (01/21 1039)   Pediatrician   HBsAg Negative (01/21 1039)   Support Person [name]? HCVAb Negative   Prenatal Classes  HIV Non Reactive (01/21 1039)     BTL Consent  GBS   GBS neg   VBAC Consent  Pap WNL 05/25/19 in Care Everywhere    Hgb Electro  Neg Horizon  BP Cuff Given in office CF Neg Horizon    SMA Carrier     [employer]  '[ ]'$  Class '[ ]'$  Consent '[ ]'$  CNM visit    Induction  '[ ]'$  Orders Entered '[ ]'$ Foley Y/N     OB History     Gravida  1   Para  0   Term  0   Preterm  0   AB  0   Living  0      SAB  0   IAB  0   Ectopic  0   Multiple  0   Live Births  0             Past Medical History:  Diagnosis Date   Anxiety    Bipolar disorder ([place])    Boils    Headache(784.0)    PCOS (polycystic ovarian syndrome) 02/26/2012   Recurrent UTI 05/15/2013   Neg 03/2020  E. Coli   Smoker 01/18/2019   Past Surgical History:  Procedure Laterality Date   LAPAROSCOPIC UNILATERAL SALPINGECTOMY Left    paratubal cystectomy   Family History: family history includes Alcohol abuse  in her brother; Breast cancer in her paternal aunt; Mental illness in her mother; Thyroid disease in her mother. Social History:  reports that she has been smoking cigarettes. She has been smoking an average of .25 packs per day. She has never used smokeless tobacco. She reports previous alcohol use. She reports current drug use. Drug: Marijuana.   Prenatal Transfer Tool  Maternal Diabetes: No Genetic Screening: Normal Maternal Ultrasounds/Referrals: Normal Fetal Ultrasounds or other Referrals:  None Maternal Substance Abuse:  Yes:  Type: Smoker, Marijuana, Cocaine-March 2022 Significant Maternal Medications:  None Significant Maternal Lab Results: Group B Strep negative   Maternal Assessment:  ROS: +Contractions, -LOF, -Vaginal Bleeding, +Fetal Movement  All other systems reviewed and negative.    Allergies  Allergen Reactions   Ibuprofen Nausea And Vomiting    Stomach upset       Last menstrual period 01/06/2020.  Physical Exam Vitals reviewed. Exam conducted with a  chaperone present.  Constitutional:      Appearance: Normal appearance.  HENT:     Head: Normocephalic and atraumatic.  Eyes:     Conjunctiva/sclera: Conjunctivae normal.  Cardiovascular:     Rate and Rhythm: Normal rate and regular rhythm.     Heart sounds: Normal heart sounds.  Pulmonary:     Effort: Pulmonary effort is normal.     Breath sounds: Normal breath sounds.  Abdominal:     General: Bowel sounds are normal.     Tenderness: There is no abdominal tenderness.     Comments: Gravid  Musculoskeletal:     Cervical back: Normal range of motion.     Right lower leg: No edema.     Left lower leg: No edema.  Skin:    General: Skin is warm and dry.  Neurological:     Mental Status: She is alert and oriented to person, place, and time.  Psychiatric:        Mood and Affect: Mood normal.        Behavior: Behavior normal.        Thought Content: Thought content normal.    Fetal Assessment: [place]: -Pelvis:Adequate -EFW: 7lbs -Presentation: Vertex  FHR: 125 bpm, Mod Var, -Decels, +Accels UCs:  Irregular, palpates mild    Assessment IUP at 39.2 weeks Cat I FT Elective IOL AMA Tobacco User H/O THC/Cocaine Use Bipolar D/O H/O Left Acetabular Labrum Tear   Plan: Admit to [facility]  Routine Labor and Delivery Orders per Protocol In room to complete assessment and discuss POC: -Discussed r/b of induction including fetal distress, serial induction, pain, and increased risk of c/s delivery -Discussed induction methods including cervical ripening agents, foley bulbs, and pitocin -Patient becomes tearful, but will not explain to provider why she is crying. -Discussed starting induction with cytotec dosing.  Patient agreeable -65mg placed in posterior fornix without issues. -Plan for SW consult d/t h/o BPD. -UDS ordered.    JLoann Quill MSN 10/06/2020, 1:55 AM

## 2020-10-07 ENCOUNTER — Inpatient Hospital Stay (HOSPITAL_COMMUNITY): Payer: Medicaid Other | Admitting: Anesthesiology

## 2020-10-07 ENCOUNTER — Inpatient Hospital Stay (HOSPITAL_COMMUNITY): Payer: Medicaid Other

## 2020-10-07 ENCOUNTER — Inpatient Hospital Stay (HOSPITAL_COMMUNITY)
Admission: AD | Admit: 2020-10-07 | Discharge: 2020-10-09 | DRG: 807 | Disposition: A | Discharge status: Home / Self Care | Payer: Medicaid Other | Attending: Obstetrics and Gynecology | Admitting: Obstetrics and Gynecology

## 2020-10-07 ENCOUNTER — Encounter (HOSPITAL_COMMUNITY): Payer: Self-pay | Admitting: Family Medicine

## 2020-10-07 DIAGNOSIS — Z886 Allergy status to analgesic agent status: Secondary | ICD-10-CM | POA: Diagnosis not present

## 2020-10-07 DIAGNOSIS — O99334 Smoking (tobacco) complicating childbirth: Secondary | ICD-10-CM | POA: Diagnosis present

## 2020-10-07 DIAGNOSIS — F1220 Cannabis dependence, uncomplicated: Secondary | ICD-10-CM | POA: Diagnosis present

## 2020-10-07 DIAGNOSIS — O99330 Smoking (tobacco) complicating pregnancy, unspecified trimester: Secondary | ICD-10-CM | POA: Diagnosis present

## 2020-10-07 DIAGNOSIS — Z3490 Encounter for supervision of normal pregnancy, unspecified, unspecified trimester: Secondary | ICD-10-CM

## 2020-10-07 DIAGNOSIS — O26893 Other specified pregnancy related conditions, third trimester: Secondary | ICD-10-CM | POA: Diagnosis present

## 2020-10-07 DIAGNOSIS — Z3A39 39 weeks gestation of pregnancy: Secondary | ICD-10-CM

## 2020-10-07 DIAGNOSIS — O99324 Drug use complicating childbirth: Secondary | ICD-10-CM | POA: Diagnosis present

## 2020-10-07 DIAGNOSIS — F1910 Other psychoactive substance abuse, uncomplicated: Secondary | ICD-10-CM | POA: Diagnosis present

## 2020-10-07 DIAGNOSIS — Z720 Tobacco use: Secondary | ICD-10-CM | POA: Diagnosis present

## 2020-10-07 DIAGNOSIS — F1410 Cocaine abuse, uncomplicated: Secondary | ICD-10-CM | POA: Diagnosis present

## 2020-10-07 DIAGNOSIS — O09529 Supervision of elderly multigravida, unspecified trimester: Secondary | ICD-10-CM

## 2020-10-07 DIAGNOSIS — F17210 Nicotine dependence, cigarettes, uncomplicated: Secondary | ICD-10-CM | POA: Diagnosis present

## 2020-10-07 DIAGNOSIS — F3164 Bipolar disorder, current episode mixed, severe, with psychotic features: Secondary | ICD-10-CM | POA: Diagnosis present

## 2020-10-07 LAB — CBC
HCT: 37.7 % (ref 36.0–46.0)
Hemoglobin: 13 g/dL (ref 12.0–15.0)
MCH: 33.3 pg (ref 26.0–34.0)
MCHC: 34.5 g/dL (ref 30.0–36.0)
MCV: 96.7 fL (ref 80.0–100.0)
Platelets: 214 10*3/uL (ref 150–400)
RBC: 3.9 MIL/uL (ref 3.87–5.11)
RDW: 12.9 % (ref 11.5–15.5)
WBC: 12.6 10*3/uL — ABNORMAL HIGH (ref 4.0–10.5)
nRBC: 0 % (ref 0.0–0.2)

## 2020-10-07 LAB — RAPID URINE DRUG SCREEN, HOSP PERFORMED
Amphetamines: NOT DETECTED
Barbiturates: NOT DETECTED
Benzodiazepines: NOT DETECTED
Cocaine: POSITIVE — AB
Opiates: NOT DETECTED
Tetrahydrocannabinol: POSITIVE — AB

## 2020-10-07 LAB — RPR: RPR Ser Ql: NONREACTIVE

## 2020-10-07 MED ORDER — LACTATED RINGERS IV SOLN
500.0000 mL | INTRAVENOUS | Status: DC | PRN
Start: 2020-10-07 — End: 2020-10-07
  Administered 2020-10-07 (×2): 250 mL via INTRAVENOUS

## 2020-10-07 MED ORDER — FENTANYL CITRATE (PF) 100 MCG/2ML IJ SOLN
INTRAMUSCULAR | Status: AC
  Administered 2020-10-07: 50 ug via INTRAVENOUS
  Filled 2020-10-07: qty 2

## 2020-10-07 MED ORDER — MISOPROSTOL 25 MCG QUARTER TABLET
25.0000 ug | ORAL_TABLET | ORAL | Status: DC | PRN
  Administered 2020-10-07: 25 ug via VAGINAL
  Filled 2020-10-07: qty 1

## 2020-10-07 MED ORDER — FLEET ENEMA 7-19 GM/118ML RE ENEM: 1.0000 | ENEMA | RECTAL | Status: DC | PRN

## 2020-10-07 MED ORDER — ONDANSETRON HCL 4 MG/2ML IJ SOLN
4.0000 mg | Freq: Four times a day (QID) | INTRAMUSCULAR | Status: DC | PRN
  Administered 2020-10-07: 4 mg via INTRAVENOUS
  Filled 2020-10-07: qty 2

## 2020-10-07 MED ORDER — PHENYLEPHRINE 40 MCG/ML (10ML) SYRINGE FOR IV PUSH (FOR BLOOD PRESSURE SUPPORT): 80.0000 ug | PREFILLED_SYRINGE | INTRAVENOUS | Status: DC | PRN

## 2020-10-07 MED ORDER — SOD CITRATE-CITRIC ACID 500-334 MG/5ML PO SOLN: 30.0000 mL | ORAL | Status: DC | PRN

## 2020-10-07 MED ORDER — EPHEDRINE 5 MG/ML INJ: 10.0000 mg | INTRAVENOUS | Status: DC | PRN

## 2020-10-07 MED ORDER — LIDOCAINE HCL (PF) 1 % IJ SOLN
INTRAMUSCULAR | Status: DC | PRN
  Administered 2020-10-07: 11 mL via EPIDURAL

## 2020-10-07 MED ORDER — OXYTOCIN-SODIUM CHLORIDE 30-0.9 UT/500ML-% IV SOLN
2.5000 [IU]/h | INTRAVENOUS | Status: DC
  Administered 2020-10-07: 2.5 [IU]/h via INTRAVENOUS
  Filled 2020-10-07: qty 500

## 2020-10-07 MED ORDER — LIDOCAINE HCL (PF) 1 % IJ SOLN: 30.0000 mL | INTRAMUSCULAR | Status: DC | PRN

## 2020-10-07 MED ORDER — OXYTOCIN BOLUS FROM INFUSION
333.0000 mL | Freq: Once | INTRAVENOUS | Status: AC
  Administered 2020-10-07: 333 mL via INTRAVENOUS

## 2020-10-07 MED ORDER — OXYTOCIN-SODIUM CHLORIDE 30-0.9 UT/500ML-% IV SOLN
1.0000 m[IU]/min | INTRAVENOUS | Status: DC
  Administered 2020-10-07: 2 m[IU]/min via INTRAVENOUS

## 2020-10-07 MED ORDER — FENTANYL CITRATE (PF) 100 MCG/2ML IJ SOLN
50.0000 ug | INTRAMUSCULAR | Status: DC | PRN
  Administered 2020-10-07 (×3): 100 ug via INTRAVENOUS
  Filled 2020-10-07 (×3): qty 2

## 2020-10-07 MED ORDER — EPHEDRINE 5 MG/ML INJ
10.0000 mg | INTRAVENOUS | Status: DC | PRN
  Filled 2020-10-07: qty 5

## 2020-10-07 MED ORDER — FENTANYL-BUPIVACAINE-NACL 0.5-0.125-0.9 MG/250ML-% EP SOLN
12.0000 mL/h | EPIDURAL | Status: DC | PRN
  Administered 2020-10-07: 12 mL/h via EPIDURAL
  Filled 2020-10-07: qty 250

## 2020-10-07 MED ORDER — TERBUTALINE SULFATE 1 MG/ML IJ SOLN: 0.2500 mg | Freq: Once | INTRAMUSCULAR | Status: DC | PRN

## 2020-10-07 MED ORDER — OXYCODONE-ACETAMINOPHEN 5-325 MG PO TABS: 1.0000 | ORAL_TABLET | ORAL | Status: DC | PRN

## 2020-10-07 MED ORDER — OXYCODONE-ACETAMINOPHEN 5-325 MG PO TABS: 2.0000 | ORAL_TABLET | ORAL | Status: DC | PRN

## 2020-10-07 MED ORDER — ACETAMINOPHEN 325 MG PO TABS: 650.0000 mg | ORAL_TABLET | ORAL | Status: DC | PRN

## 2020-10-07 MED ORDER — DIPHENHYDRAMINE HCL 50 MG/ML IJ SOLN
12.5000 mg | INTRAMUSCULAR | Status: DC | PRN
Start: 2020-10-07 — End: 2020-10-07

## 2020-10-07 MED ORDER — LACTATED RINGERS IV SOLN
500.0000 mL | Freq: Once | INTRAVENOUS | Status: AC
  Administered 2020-10-07: 500 mL via INTRAVENOUS

## 2020-10-07 MED ORDER — LACTATED RINGERS IV SOLN: INTRAVENOUS | Status: DC

## 2020-10-07 NOTE — Progress Notes (Signed)
[name] [name] is a 35 y.o. G1P0000 at 52w2dby admitted for eIOL  Subjective: Reports she would like to have baby. Reports some pressure with contractions but no pain. Epidural working well. Wondering when she can be AROMed.  Objective: BP (!) 117/94   Pulse 65   Temp (!) 97.1 F (36.2 C) (Axillary)   Resp 18   Ht '5\' 11"'$  (1.803 m)   Wt 111.9 kg   LMP 01/06/2020 (Approximate)   SpO2 100%   BMI 34.42 kg/m  No intake/output data recorded. No intake/output data recorded.  FHT:  FHR: 120 bpm, variability: moderate,  accelerations:  Present,  decelerations:  Present intermittent late decel but with reassuring accels UC:   regular, every q3-4 minutes SVE:   Dilation: 6.5 Effacement (%): 100 Station: 0 Exam by:: rzhang,rnc-ob  Labs: Lab Results  Component Value Date   WBC 12.6 (H) 10/07/2020   HGB 13.0 10/07/2020   HCT 37.7 10/07/2020   MCV 96.7 10/07/2020   PLT 214 10/07/2020    Assessment / Plan: Induction of labor due to elective,  progressing well on pitocin  Labor: Progressing on Pitocin, will continue to increase then AROM once attending out of OR.  Fetal Wellbeing:  Category II with intermittent late decelerations, not recurrent (<50%), resolved with position change and IV bolus and with appropriate accels. Will continue to monitor Pain Control:  Epidural I/D:   GBS negative Anticipated MOD:  NSVD  ARenard Matter MD, MPH OB Fellow, Faculty Practice

## 2020-10-07 NOTE — Progress Notes (Signed)
[name] [name] MRN: [id number]  Subjective: -Nurse calls and states patient requesting provider to bedside.  Upon arrival patient expresses concern about "the heart rate going up and down."  Patient also requesting c/s.   Objective: BP (!) 104/57   Pulse 70   Resp 16   Ht '5\' 11"'$  (1.803 m)   Wt 111.9 kg   LMP 01/06/2020 (Approximate)   BMI 34.42 kg/m  No intake/output data recorded. No intake/output data recorded.  Fetal Monitoring: FHT: 125 bpm, Mod Var, -Decels, +Accels UC: Palpates mild    Vaginal Exam: SVE:   Dilation: Fingertip Effacement (%): 90 Station: -2 Exam by:: J.Suprena Travaglini, CNM Membranes:Intact Internal Monitors: None  Augmentation/Induction: Pitocin:None Cytotec: S/p 1st Dose  Assessment:  IUP at 39 weeks Cat I FT  eIOL Bipolar Disorder  Plan: -Reassured that heart rate with deceleration, but has since improved. -Discussed option for discharge or continuation of induction process. -Informed that no indication for C/S despite one variable. -Patient extremely tearful  -Patient informed that provider will contact MD expressing concern, but will likely not get a c/s. -Dr. Harolyn Rutherford contacted and agrees with provider.  Advised that patient can be discharged home with follow up at primary office or can continue process.  -Patient informed and continues to be tearful. Comfort given. -Provider allows patient time to process and encouraged to contact nurse once she has decided.   Riley Churches, CNM Advanced Practice Provider, [facility] [place] 10/07/2020, 6:04 AM   Addendum 7:03 AM  -Patient opts for continuation of induction process.  -Nurse reports patient requested exam and FT/50 -Patient with contractions every 1-3 minutes, palpates moderate. -Cooks attempted and effacement noted to be at least 90%. -Patient remains tearful. Comfort given by provider and nurse. -Discussed potentially starting pitocin once/if contractions start to  slow down. -Patient offered and accepts pain medication. -Fentanyl ordered. -Continue other mgmt as ordered. -L&D team to be updated on patient status.   Maryann Conners MSN, CNM Advanced Practice Provider, [facility]

## 2020-10-07 NOTE — Anesthesia Procedure Notes (Signed)
Epidural Patient location during procedure: OB Start time: 10/07/2020 2:54 PM End time: 10/07/2020 3:08 PM  Staffing Anesthesiologist: Lynda Rainwater, MD Performed: anesthesiologist   Preanesthetic Checklist Completed: patient identified, IV checked, site marked, risks and benefits discussed, surgical consent, monitors and equipment checked, pre-op evaluation and timeout performed  Epidural Patient position: sitting Prep: ChloraPrep Patient monitoring: heart rate, cardiac monitor, continuous pulse ox and blood pressure Approach: midline Location: L2-L3 Injection technique: LOR saline  Needle:  Needle type: Tuohy  Needle gauge: 17 G Needle length: 9 cm Needle insertion depth: 7 cm Catheter type: closed end flexible Catheter size: 20 Guage Catheter at skin depth: 11 cm Test dose: negative  Assessment Events: blood not aspirated, injection not painful, no injection resistance, no paresthesia and negative IV test  Additional Notes Reason for block:procedure for pain

## 2020-10-07 NOTE — Progress Notes (Signed)
Labor Progress Note [name] [name] is a 35 y.o. G1P0000 at 30w2dpresented for eIOL S:  Doing well, ready for her epidural and feeling more pelvic pressure  O:  BP (!) 96/58   Pulse (!) 154   Temp 98 F (36.7 C) (Axillary)   Resp 18   Ht '5\' 11"'$  (1.803 m)   Wt 111.9 kg   LMP 01/06/2020 (Approximate)   SpO2 99%   BMI 34.42 kg/m  EFM: 110bpm/moderate variability/+accels, no decels Toco: q6 min  CVE: Dilation: 3 Effacement (%): 100 Cervical Position: Anterior Station: -1, 0 Presentation: Vertex Exam by:: rzhang,rnc-ob   A&P: 35y.o. G1P0000 332w2dIOL #Labor: Early labor, cytotec x1 @ [id number] pitocin @ 1358 #Pain: PRN #FWB: Cat 1 #GBS negative #H/O Left labrum tear: consider alternative positions for delivery like hands and knees  AnWaldon MerlMD 2:12 PM

## 2020-10-07 NOTE — Plan of Care (Signed)
Care plan progressing  Gaylyn Rong RN

## 2020-10-07 NOTE — Progress Notes (Signed)
Labor Progress Note [name] [name] is a 35 y.o. G1P0000 at 30w2dpresented for eIOL S:  Flat affect but engages in conversation. Wants to know when this will be all done. Discussed with patient that inductions do not have a set time but that if she is feeling uncomfortable that is a good sign that her labor is progressing  O:  BP 117/70   Pulse (!) 59   Temp (!) 97 F (36.1 C) (Oral)   Resp 18   Ht '5\' 11"'$  (1.803 m)   Wt 111.9 kg   LMP 01/06/2020 (Approximate)   SpO2 99%   BMI 34.42 kg/m  EFM: 110bpm/moderate variability/+accels, no decels Toco: q4 min  CVE: Dilation: Fingertip Effacement (%): 90 Cervical Position: Anterior Station: -2 Presentation: Vertex Exam by:: J.Emly, CNM   A&P: 35y.o. G1P0000 350w2dIOL #Labor: Early labor, cytotec x1 @ 0141 but was contracting too much for a second one. As contractions are starting to space out will check within the next hour and give second cytotec vs pitocin #Pain: PRN #FWB: Cat 1 #GBS negative #H/O Left labrum tear: consider alternative positions for delivery like hands and knees  AnWaldon MerlMD 9:46 AM

## 2020-10-07 NOTE — Progress Notes (Signed)
Labor Progress Note Dot Anzel Bobbitt [name] is a 35 y.o. G1P0000 at 57w2dpresented for eIOL S:  Paged to room for significantly prolonged decel to 60 that resolved with pitocin stopping and positional changes  O:  BP 119/82   Pulse 61   Temp 98 F (36.7 C) (Axillary)   Resp 20   Ht '5\' 11"'$  (1.803 m)   Wt 111.9 kg   LMP 01/06/2020 (Approximate)   SpO2 100%   BMI 34.42 kg/m  EFM: 110bpm/moderate variability/+accels, prolonged decel to 60 lasting 4 minutes Toco: q2-3 min  CVE: Dilation: 3 Effacement (%): 100 Cervical Position: Anterior Station: -1, 0 Presentation: Vertex Exam by:: rzhang,rnc-ob   A&P: 35y.o. G1P0000 330w2dIOL #Labor: Early labor, cytotec x1 @ [id number] pitocin @ 1358. Now off pitocin for prolonged decel but will restart once fetal recovery. Consider AROM however bulging bag and fetal head not fully engaged #Pain: PRN #FWB: Cat 2 but reassuring after pitocin stopped. Continue to monitor #GBS negative #H/O Left labrum tear: consider alternative positions for delivery like hands and knees  AnWaldon MerlMD 3:53 PM

## 2020-10-07 NOTE — Anesthesia Preprocedure Evaluation (Signed)
Anesthesia Evaluation  Patient identified by MRN, date of birth, ID band Patient awake    Reviewed: Allergy & Precautions, NPO status , Patient's Chart, lab work & pertinent test results  Airway Mallampati: II  TM Distance: >3 FB Neck ROM: Full    Dental no notable dental hx.    Pulmonary neg pulmonary ROS, Current Smoker and Patient abstained from smoking.,    Pulmonary exam normal breath sounds clear to auscultation       Cardiovascular negative cardio ROS Normal cardiovascular exam Rhythm:Regular Rate:Normal     Neuro/Psych  Headaches, Anxiety Bipolar Disorder negative psych ROS   GI/Hepatic negative GI ROS, Neg liver ROS,   Endo/Other  negative endocrine ROS  Renal/GU negative Renal ROS  negative genitourinary   Musculoskeletal negative musculoskeletal ROS (+)   Abdominal   Peds negative pediatric ROS (+)  Hematology negative hematology ROS (+)   Anesthesia Other Findings   Reproductive/Obstetrics (+) Pregnancy                             Anesthesia Physical Anesthesia Plan  ASA: 2  Anesthesia Plan: Epidural   Post-op Pain Management:    Induction:   PONV Risk Score and Plan:   Airway Management Planned:   Additional Equipment:   Intra-op Plan:   Post-operative Plan:   Informed Consent:   Plan Discussed with:   Anesthesia Plan Comments:         Anesthesia Quick Evaluation

## 2020-10-07 NOTE — Plan of Care (Signed)
  Problem: Education: Goal: Knowledge of Childbirth will improve Outcome: Adequate for Discharge Goal: Ability to make informed decisions regarding treatment and plan of care will improve Outcome: Adequate for Discharge Goal: Ability to state and carry out methods to decrease the pain will improve Outcome: Completed/Met Goal: Individualized Educational Video(s) Outcome: Not Applicable   Problem: Coping: Goal: Ability to verbalize concerns and feelings about labor and delivery will improve Outcome: Completed/Met   Problem: Life Cycle: Goal: Ability to make normal progression through stages of labor will improve Outcome: Completed/Met Goal: Ability to effectively push during vaginal delivery will improve Outcome: Completed/Met   Problem: Role Relationship: Goal: Will demonstrate positive interactions with the child Outcome: Completed/Met   Problem: Safety: Goal: Risk of complications during labor and delivery will decrease Outcome: Completed/Met   Problem: Pain Management: Goal: Relief or control of pain from uterine contractions will improve Outcome: Completed/Met   Problem: Education: Goal: Knowledge of General Education information will improve Description: Including pain rating scale, medication(s)/side effects and non-pharmacologic comfort measures Outcome: Adequate for Discharge   Problem: Health Behavior/Discharge Planning: Goal: Ability to manage health-related needs will improve Outcome: Adequate for Discharge

## 2020-10-08 ENCOUNTER — Other Ambulatory Visit: Payer: Self-pay | Admitting: Family Medicine

## 2020-10-08 LAB — TYPE AND SCREEN
ABO/RH(D): A POS
Antibody Screen: NEGATIVE

## 2020-10-08 MED ORDER — OXYTOCIN-SODIUM CHLORIDE 30-0.9 UT/500ML-% IV SOLN: 2.5000 [IU]/h | INTRAVENOUS | Status: DC | PRN

## 2020-10-08 MED ORDER — SIMETHICONE 80 MG PO CHEW: 80.0000 mg | CHEWABLE_TABLET | ORAL | Status: DC | PRN

## 2020-10-08 MED ORDER — DIBUCAINE (PERIANAL) 1 % EX OINT: 1.0000 "application " | TOPICAL_OINTMENT | CUTANEOUS | Status: DC | PRN

## 2020-10-08 MED ORDER — ACETAMINOPHEN 325 MG PO TABS
650.0000 mg | ORAL_TABLET | ORAL | Status: DC | PRN
  Administered 2020-10-08 (×4): 650 mg via ORAL
  Filled 2020-10-08 (×5): qty 2

## 2020-10-08 MED ORDER — DIPHENHYDRAMINE HCL 25 MG PO CAPS: 25.0000 mg | ORAL_CAPSULE | Freq: Four times a day (QID) | ORAL | Status: DC | PRN

## 2020-10-08 MED ORDER — COCONUT OIL OIL: 1.0000 "application " | TOPICAL_OIL | Status: DC | PRN

## 2020-10-08 MED ORDER — ONDANSETRON HCL 4 MG PO TABS: 4.0000 mg | ORAL_TABLET | ORAL | Status: DC | PRN

## 2020-10-08 MED ORDER — BENZOCAINE-MENTHOL 20-0.5 % EX AERO
1.0000 "application " | INHALATION_SPRAY | CUTANEOUS | Status: DC | PRN
  Administered 2020-10-08: 1 via TOPICAL
  Filled 2020-10-08: qty 56

## 2020-10-08 MED ORDER — OXYCODONE HCL 5 MG PO TABS
5.0000 mg | ORAL_TABLET | Freq: Four times a day (QID) | ORAL | Status: DC | PRN
Start: 2020-10-08 — End: 2020-10-09
  Administered 2020-10-08 (×2): 5 mg via ORAL
  Filled 2020-10-08 (×2): qty 1

## 2020-10-08 MED ORDER — WITCH HAZEL-GLYCERIN EX PADS: 1.0000 "application " | MEDICATED_PAD | CUTANEOUS | Status: DC | PRN

## 2020-10-08 MED ORDER — TETANUS-DIPHTH-ACELL PERTUSSIS 5-2.5-18.5 LF-MCG/0.5 IM SUSY: 0.5000 mL | PREFILLED_SYRINGE | Freq: Once | INTRAMUSCULAR | Status: DC

## 2020-10-08 MED ORDER — ONDANSETRON HCL 4 MG/2ML IJ SOLN: 4.0000 mg | INTRAMUSCULAR | Status: DC | PRN

## 2020-10-08 MED ORDER — PRENATAL MULTIVITAMIN CH
1.0000 | ORAL_TABLET | Freq: Every day | ORAL | Status: DC
  Administered 2020-10-08 – 2020-10-09 (×2): 1 via ORAL
  Filled 2020-10-08 (×2): qty 1

## 2020-10-08 NOTE — Discharge Summary (Signed)
Postpartum Discharge Summary     Patient Name: [name] [name] DOB: 16-Aug-1985 MRN: [id number]  Date of admission: 10/07/2020 Delivery date:10/07/2020  Delivering provider: Aletha Halim  Date of discharge: 10/09/2020  Admitting diagnosis: Term pregnancy [Z34.90] Intrauterine pregnancy: [redacted]w[redacted]d    Secondary diagnosis:  Active Problems:   Polysubstance abuse ([place]   Bipolar disorder, curr episode mixed, severe, with psychotic features ([place]   AMA (advanced maternal age) multigravida 35+   Tobacco use affecting pregnancy, antepartum   Term pregnancy   Vacuum-assisted vaginal delivery  Additional problems: none    Discharge diagnosis: Term Pregnancy Delivered                                              Post partum procedures: Augmentation: Pitocin and Cytotec Complications: None  Hospital course: Induction of Labor With Vaginal Delivery   35y.o. yo G1P0000 at 36w3das admittedas admitted to the hospital 10/07/2020 for induction of labor.  Indication for induction: Elective.  Patient had an uncomplicated labor course as follows: Membrane Rupture Time/Date: 8:20 PM ,10/07/2020   Delivery Method:Vaginal, Vacuum (Extractor)  Episiotomy:   Lacerations:  1st degree;Perineal;Periurethral  Details of delivery can be found in separate delivery note.  Patient had a routine postpartum course. Patient is discharged home 10/09/20.  Newborn Data: Birth date:10/07/2020  Birth time:9:33 PM  Gender:Female  Living status:Living  Apgars:9 ,9  Weight:3535 g   Magnesium Sulfate received: No BMZ received: No Rhophylac:No MMR:No T-DaP: Declined  Flu: No Transfusion:No  Physical exam  Vitals:   10/08/20 0900 10/08/20 1253 10/08/20 1950 10/09/20 0630  BP: 120/78 124/80 128/81 (!) 147/86  Pulse: 65 64 67 82  Resp: 18 16 17 18   Temp: (!) 97.3 F (36.3 C) 98.6 F (37 C) 97.6 F (36.4 C) 98.1 F (36.7 C)  TempSrc: Axillary Axillary Axillary Axillary  SpO2: 99% 99% 95% 98%  Weight:       Height:       General: alert, cooperative, and no distress Lochia: appropriate Uterine Fundus: firm Incision: N/A DVT Evaluation: No evidence of DVT seen on physical exam. Negative Homan's sign. No cords or calf tenderness. Labs: Lab Results  Component Value Date   WBC 12.6 (H) 10/07/2020   HGB 13.0 10/07/2020   HCT 37.7 10/07/2020   MCV 96.7 10/07/2020   PLT 214 10/07/2020   CMP Latest Ref Rng & Units 12/15/2015  Glucose 65 - 99 mg/dL 97  BUN 6 - 20 mg/dL 5(L)  Creatinine 0.44 - 1.00 mg/dL 0.87  Sodium 135 - 145 mmol/L 140  Potassium 3.5 - 5.1 mmol/L 3.8  Chloride 101 - 111 mmol/L 111  CO2 22 - 32 mmol/L 22  Calcium 8.9 - 10.3 mg/dL 9.1  Total Protein 6.5 - 8.1 g/dL 6.2(L)  Total Bilirubin 0.3 - 1.2 mg/dL 0.5  Alkaline Phos 38 - 126 U/L 54  AST 15 - 41 U/L 47(H)  ALT 14 - 54 U/L 46   Edinburgh Score: Edinburgh Postnatal Depression Scale Screening Tool 10/08/2020  I have been able to laugh and see the funny side of things. 0  I have looked forward with enjoyment to things. 0  I have blamed myself unnecessarily when things went wrong. 1  I have been anxious or worried for no good reason. 1  I have felt scared or panicky for no good reason.  1  Things have been getting on top of me. 1  I have been so unhappy that I have had difficulty sleeping. 1  I have felt sad or miserable. 1  I have been so unhappy that I have been crying. 0  The thought of harming myself has occurred to me. 0  Edinburgh Postnatal Depression Scale Total 6     After visit meds:  Allergies as of 10/09/2020       Reactions   Ibuprofen Nausea And Vomiting   Stomach upset        Medication List     TAKE these medications    multivitamin-prenatal 27-0.8 MG Tabs tablet Take 1 tablet by mouth daily at 12 noon.   naproxen 500 MG tablet Commonly known as: NAPROSYN Take 1 tablet (500 mg total) by mouth 2 (two) times daily with a meal.         Discharge home in stable condition Infant  Feeding: Bottle Infant Disposition: pending CPS Discharge instruction: per After Visit Summary and Postpartum booklet. Activity: Advance as tolerated. Pelvic rest for 6 weeks.  Diet: routine diet Future Appointments: Future Appointments  Date Time Provider [place]  11/07/2020  1:15 PM Donnamae Jude, MD [facility]   Follow up Visit:  [place] for [place] at [facility] Follow up on 11/07/2020.   Specialty: Obstetrics and Gynecology Why: for postpartum checkup Contact information: [place] [phone]                Message sent by Dr. Higinio Plan on 8/8 to [facility]  Please schedule this patient for a In person postpartum visit in 4 weeks with the following provider: Any provider. Additional Postpartum F/U: None   High risk pregnancy complicated by:  AMA/Tobacco use/Bipolar disorder Delivery mode:  Vaginal, Vacuum (Extractor)  Anticipated Birth Control:  Condoms   10/09/2020 Christin Fudge, CNM

## 2020-10-08 NOTE — Anesthesia Postprocedure Evaluation (Signed)
Anesthesia Post Note  Patient: [name] [name]  Procedure(s) Performed: AN AD HOC LABOR EPIDURAL     Anesthesia Type: Epidural Anesthetic complications: no   No notable events documented.  Last Vitals:  Vitals:   10/08/20 0510 10/08/20 0900  BP: 118/77 120/78  Pulse: 68 65  Resp: 16 18  Temp:  (!) 36.3 C  SpO2: 100% 99%    Last Pain:  Vitals:   10/08/20 1141  TempSrc:   PainSc: 4    Pain Goal:                   [employer]

## 2020-10-08 NOTE — Progress Notes (Signed)
POSTPARTUM PROGRESS NOTE  Post Partum Day 1  Subjective:  [name] [name] is a 35 y.o. G1P0000 s/p VAVD at [redacted]w[redacted]d  She reports she is doing well. No acute events overnight. She denies any problems with ambulating, voiding or po intake. Denies nausea or vomiting.  Pain is well controlled.  Lochia is mild.  Objective: Blood pressure 118/77, pulse 68, temperature (!) 97.4 F (36.3 C), temperature source Axillary, resp. rate 16, height '5\' 11"'$  (1.803 m), weight 111.9 kg, last menstrual period 01/06/2020, SpO2 100 %.  Physical Exam:  General: alert, cooperative and no distress Chest: no respiratory distress Heart: distal pulses intact Uterine Fundus: firm, appropriately tender DVT Evaluation: No calf swelling or tenderness Extremities: No edema Skin: warm, dry  Recent Labs    10/07/20 0037  HGB 13.0  HCT 37.7    Assessment/Plan: [name] [name] a 35y.o. G1P0000 s/p VAVD at 324w3d PPD#1 - Doing well  Routine postpartum care  Contraception: condoms, aware of alternative options  Feeding: bottle   #THC, tobacco, and cocaine use in pregnancy  Bipolar disorder: No current mood stabilizer. UDS + for thc/cocaine. SW consulted.   Dispo: Plan for discharge tomorrow pending SW.   LOS: 1 day   SaDarrelyn HillockDO  OB Fellow  10/08/2020, 7:25 AM

## 2020-10-08 NOTE — Clinical Social Work Maternal (Signed)
CLINICAL SOCIAL WORK MATERNAL/CHILD NOTE  Patient Details  Name: [name] [name] MRN: [id number] Date of Birth: 01-17-1986  Date:  10/08/2020  Clinical Social Worker Initiating Note:  Darra Lis, [place] Date/Time: Initiated:  10/08/20/0945     Child's Name:  Vicki Mallet   Biological Parents:  Mother, Father [name] 09/06/1974)   Need for Interpreter:  None   Reason for Referral:  Current Substance Use/Substance Use During Pregnancy  , Behavioral Health Concerns   Address:  [address] [place] [address]    Phone number:  [phone] (home)     Additional phone number:   Household Members/Support Persons (HM/SP):   Household Member/Support Person 1, Household Member/Support Person 2, Household Member/Support Person 3   HM/SP Name Relationship DOB or Age  HM/SP -28 [place] Stepmother 71s  HM/SP -[place] Sister 78  HM/SP -[place] Sister 16  HM/SP -4        HM/SP -5        HM/SP -6        HM/SP -7        HM/SP -8          Natural Supports (not living in the home):  [employer], Medical laboratory scientific officer, Building services engineer other   Professional Supports: Organized support group (Comment) ([employer] [facility])   Employment: Ship broker   Type of Work:     Education:  [place] arranged:    Museum/gallery curator Resources:  Medicaid   Other Resources:      Cultural/Religious Considerations Which May Impact Care:    Strengths:  Ability to meet basic needs  , Home prepared for child     Psychotropic Medications:         Pediatrician:       Pediatrician List:   [place]      Pediatrician Fax Number:    Risk Factors/Current Problems:  Substance Use     Cognitive State:  Alert  , Goal Oriented  , Insightful  , Linear Thinking     Mood/Affect:  Happy  , Interested  , Relaxed     CSW Assessment: CSW consulted for history of bipolar,  anxiety, depression and THC/Cocaine positive UDS.  CSW met with [name] to assess and offer support. CSW introduced self and role. CSW observed [name] Bobby sleeping on couch. CSW informed [name] assessment could be completed later in private, however [name] declined and stated anything could be discussed with [name] present, including substance use.  CSW inquired on [name] social situation. [name] reported she lives with her stepmother and two sisters. [name] stated she receives [facility] and food stamps. Additionally, [name] receives $841 monthly in disability. [name] aware to contact [place] to have infant added to benefits.   CSW inquired on [name] current emotions and mental health history. [name] smiled and stated she is doing well and had a good pregnancy. [name] disclosed she was diagnosed with bipolar, depression and anxiety in 2012. [name] shared that she experienced some anxiety during the pregnancy and coped by going to church, music and being around positive people. CSW asked [name] when she last experienced bipolar symptoms. [name] stated 2012 was the last time she experienced bipolar symptoms, which presented as psychosis. [name] reported she has been on Lexapro, Seroquel and received injections in the past. [name] stated she was last on medications two  years ago. [name] expressed she did not find the medications to be helpful due to the side effects she experienced. CSW discussed therapy with [name]. [name] stated she attended therapy in the past, as well as during pregnancy which she found to be helpful. [name] plans to continue therapy postpartum and was receptive to resources provided. [name] identified [name], her church family, sisters and stepmother as supports. [name] is also involved with [employer] and has a case worker Tillie Rung who will be supportive over the next couple years. [name] denies any current SI or HI.   CSW inquired on [name] substance use. [name] reported she has not ingested any cocaine. [name] stated she was helping sell cocaine to financially support  preparing for infant and the cocaine must have been absorbed through her pores. [name] reported "this is my first pregnancy and I didn't know how things would go. I have everything I need now and [name] is involved." CSW asked [name] when she last handled the cocaine, she stated two weeks ago and denies current selling. [name] disclosed she smoked THC throughout the pregnancy to help with eating and sleeping. [name] stated she informed her prenatal doctors and they tried to educate and coach her on down to cut down. CSW informed [name] of the hospital drug screen policy. [name] aware infant UDS is positive for THC and the CDS will be followed. CSW informed [name] a [facility] CPS report will be made due to infant's positive UDS. [name] expressed understanding.   CSW provided education regarding the baby blues period versus PPD and provided resources. CSW provided the New Mom Checklist and encouraged [name] to self evaluate and contact a medical professional if symptoms are noted at any time.   CSW provided review of Sudden Infant Death Syndrome (SIDS) precautions. [name] stated she has all infant essentials, including a bassinet, packn'play and car seat. [name] denies any transportation barriers to follow-up care.   CSW filed [facility] CPS report for positive UDS. CSW will continue to follow CDS and make a CPS report if warranted. CSW identifies no further need for intervention.   Barriers to discharge at this time.   CSW Plan/Description:  CSW Will Continue to Monitor Umbilical Cord Tissue Drug Screen Results and Make Report if Warranted, Child Protective Service Report  , Perinatal Mood and Anxiety Disorder (PMADs) Education, Sudden Infant Death Syndrome (SIDS) Education, [place], Other Patient/Family Education, Other Information/Referral to [employer], [place], [place] 10/08/2020, 10:31 AM

## 2020-10-08 NOTE — Progress Notes (Signed)
Patient states she has never had any Suicidal thoughts.

## 2020-10-08 NOTE — Social Work (Signed)
Case accepted by [place]. Assigned to [place] [employer] [phone].   Barriers to discharge at this time, pending CPS assessment.   Darra Lis, MSW, [place] Work [employer] and [employer] [phone]

## 2020-10-09 ENCOUNTER — Encounter: Payer: Self-pay | Admitting: *Deleted

## 2020-10-09 MED ORDER — NAPROXEN 250 MG PO TABS
500.0000 mg | ORAL_TABLET | Freq: Two times a day (BID) | ORAL | Status: DC
  Administered 2020-10-09: 500 mg via ORAL
  Filled 2020-10-09 (×2): qty 2

## 2020-10-09 MED ORDER — NAPROXEN 500 MG PO TABS: 500.0000 mg | ORAL_TABLET | Freq: Two times a day (BID) | ORAL | 0 refills | Status: DC

## 2020-10-09 NOTE — Progress Notes (Signed)
Interim progress note  S:Received page from RN that patient was experiencing severe low back and hip pain on the right.  She was given oxycodone and Tylenol with minimal improvement.  Evaluated patient at bedside.  She reports this pain has been present since 8/7 following delivery and has not progressed but continually bothersome.  She initially did not pay much attention to it as she had just delivered, however despite 2 days of it not getting better she has become worried.  It is like a dull ache in her right side low back and hurts with walking and moving around.  No associated numbness, extremity weakness, bowel/bladder incontinence/retention, or saddle anesthesia.  She has a documented allergy to ibuprofen with N/V/stomach upset.  She reports that she has been able to take Aleve numerous times without any stomach upset.   O:Blood pressure 128/81, pulse 67, temperature 97.6 F (36.4 C), temperature source Axillary, resp. rate 17, height '5\' 11"'$  (1.803 m), weight 111.9 kg, last menstrual period 01/06/2020, SpO2 95 %.  General: Sitting up comfortably in bed holding infant MSK: Lumbar spine: - Inspection: no gross deformity or asymmetry, swelling or ecchymosis, no rash seen - Palpation: No TTP over the spinous processes, however mildly tender to right paraspinal musculature and peri-SI joint on the right. - ROM: full active ROM of the lumbar spine in flexion and extension with some discomfort - Strength: 5/5 strength of lower extremity in L4-S1 nerve root distributions b/l - Neuro: sensation intact in the L4-S1 nerve root distribution b/l  A/P: Hip/lumbar dysfunction in the setting of recent delivery/postpartum transition. Reassuringly without any concerning red flags.  Will add on naproxen 500 mg twice daily with food and heating pad.  Encouraged range of motion as tolerated.  Patient aware of alarm s/sx.   Patriciaann Clan, DO

## 2020-10-09 NOTE — Progress Notes (Signed)
Pt requested RN to call MD due to severe lower back and hip pain. Pt rates pain score 8 out of 10. RN has administered Tylenol and '5mg'$  Oxy, pt states no improvement with pain. Pt is tearful at this time, RN offered heat therapy but pt declined at this time. RN notified Dr. Higinio Plan of the situation and she plans to come assess the pt in a little bit. RN will continue to monitor pt.     Marguerita Beards, RN

## 2020-10-09 NOTE — Social Work (Addendum)
11:50a CSW informed by [name] that she has spoken with CPS SW Clide Deutscher [phone] and they have a meeting scheduled for 2pm at her home today.  CSW attempted to contact CPS SW via phone and e-mail, currently awaiting follow-up. CSW also unsuccessfully attempted to contact [place].   [place] has not made contact regarding any concerns related to discharge. Pediatrician, as well as RN expressed no concerns regarding [name]'s ability to care for infant. CSW has observed [name] appropriately interacting with infant.   Update: CSW spoke with CPS SW Deloria Lair and verified he will be following-up with [name] in the community.  No barriers to discharge at this time.  Darra Lis, MSW, [place] Work [employer] and [employer] [phone]

## 2020-10-19 ENCOUNTER — Telehealth (HOSPITAL_COMMUNITY): Payer: Self-pay | Admitting: *Deleted

## 2020-10-19 NOTE — Telephone Encounter (Signed)
Mom reports feeling good. Trying to dry up her milk. Suggested she use ice packs, snug fitting sports bra, and keep stimulation low. No concerns about herself. EPDS=5 (hospital score=6) Mom reports baby is well. Feeding, peeing, and pooping without difficulty. Sleeps in crib on back. No concerns about baby.  Odis Hollingshead, RN 10-19-2020 at 12:03pm

## 2020-11-07 ENCOUNTER — Encounter: Payer: Self-pay | Admitting: Family Medicine

## 2020-11-07 ENCOUNTER — Ambulatory Visit: Payer: Self-pay | Admitting: Family Medicine

## 2020-11-07 NOTE — Progress Notes (Signed)
Patient did not keep appointment today. She will be called to reschedule.  

## 2020-11-26 ENCOUNTER — Encounter: Payer: Self-pay | Admitting: General Practice

## 2020-11-26 ENCOUNTER — Other Ambulatory Visit (HOSPITAL_COMMUNITY)
Admission: RE | Admit: 2020-11-26 | Discharge: 2020-11-26 | Disposition: A | Discharge status: Home / Self Care | Payer: Medicaid Other | Source: Ambulatory Visit | Attending: Family Medicine | Admitting: Family Medicine

## 2020-11-26 ENCOUNTER — Ambulatory Visit (INDEPENDENT_AMBULATORY_CARE_PROVIDER_SITE_OTHER): Payer: Medicaid Other | Admitting: General Practice

## 2020-11-26 ENCOUNTER — Other Ambulatory Visit: Payer: Self-pay

## 2020-11-26 VITALS — BP 127/70 | HR 95 | Ht 71.0 in | Wt 232.0 lb

## 2020-11-26 DIAGNOSIS — Z113 Encounter for screening for infections with a predominantly sexual mode of transmission: Secondary | ICD-10-CM

## 2020-11-26 DIAGNOSIS — R300 Dysuria: Secondary | ICD-10-CM

## 2020-11-26 MED ORDER — PHENAZOPYRIDINE HCL 200 MG PO TABS: 200.0000 mg | ORAL_TABLET | Freq: Three times a day (TID) | ORAL | 0 refills | Status: AC | PRN

## 2020-11-26 MED ORDER — NITROFURANTOIN MONOHYD MACRO 100 MG PO CAPS: 100.0000 mg | ORAL_CAPSULE | Freq: Two times a day (BID) | ORAL | 0 refills | Status: AC

## 2020-11-26 NOTE — Progress Notes (Signed)
Patient presents to office today reporting dysuria, frequency, hematuria, and incomplete emptying that all started this morning. Patient reports history of urinary tract infections "that get really bad". Patient is also requesting self swab for std testing. UA is suspicious for UTI. Macrobid & pyridium sent to pharmacy per protocol. Urine culture ordered. Patient was instructed in self swab & specimen was collected. Discussed results would be back in 24-48 hours and available via mychart. Patient verbalized understanding to all.   Koren Bound RN BSN 11/26/20

## 2020-11-27 LAB — POCT URINALYSIS DIP (DEVICE)
Bilirubin Urine: NEGATIVE
Glucose, UA: NEGATIVE mg/dL
Ketones, ur: NEGATIVE mg/dL
Nitrite: NEGATIVE
Protein, ur: 100 mg/dL — AB
Specific Gravity, Urine: 1.02 (ref 1.005–1.030)
Urobilinogen, UA: 0.2 mg/dL (ref 0.0–1.0)
pH: 7 (ref 5.0–8.0)

## 2020-11-27 LAB — CERVICOVAGINAL ANCILLARY ONLY
Chlamydia: NEGATIVE
Comment: NEGATIVE
Comment: NEGATIVE
Comment: NORMAL
Neisseria Gonorrhea: NEGATIVE
Trichomonas: NEGATIVE

## 2020-11-27 NOTE — Progress Notes (Signed)
Pt called today and asking for return to work note. Note written and sent in through mychart.  Colletta Maryland, RN

## 2020-11-27 NOTE — Progress Notes (Signed)
Agree with A & P. 

## 2020-11-30 LAB — URINE CULTURE

## 2020-12-04 ENCOUNTER — Telehealth: Payer: Self-pay | Admitting: General Practice

## 2020-12-04 ENCOUNTER — Other Ambulatory Visit: Payer: Self-pay

## 2020-12-04 ENCOUNTER — Ambulatory Visit (INDEPENDENT_AMBULATORY_CARE_PROVIDER_SITE_OTHER): Payer: Medicaid Other

## 2020-12-04 VITALS — BP 132/84 | HR 97 | Ht 71.0 in | Wt 225.0 lb

## 2020-12-04 DIAGNOSIS — R300 Dysuria: Secondary | ICD-10-CM | POA: Diagnosis not present

## 2020-12-04 DIAGNOSIS — N390 Urinary tract infection, site not specified: Secondary | ICD-10-CM | POA: Diagnosis not present

## 2020-12-04 DIAGNOSIS — Z3202 Encounter for pregnancy test, result negative: Secondary | ICD-10-CM

## 2020-12-04 DIAGNOSIS — R319 Hematuria, unspecified: Secondary | ICD-10-CM | POA: Diagnosis not present

## 2020-12-04 LAB — POCT PREGNANCY, URINE: Preg Test, Ur: NEGATIVE

## 2020-12-04 MED ORDER — CEFTRIAXONE SODIUM 500 MG IJ SOLR
500.0000 mg | Freq: Once | INTRAMUSCULAR | Status: AC
  Administered 2020-12-04: 500 mg via INTRAMUSCULAR

## 2020-12-04 NOTE — Progress Notes (Signed)
Pt here today for treatment of UTI. Pt states still having urinary frequency since finishing Rx Macrobid for + UC. Pt talked to [place], [place] today and was advised by Dr Rip Harbour to come in for Rocephin 500mg  x 1 IM injection today. Pt also worried about being pregnant again. Had intercourse since delivery and no protection. UPT today in office was negative. Pt given results. Pt denies any fever, chills or back pain at this time.  Rocephin given per Dr Rip Harbour. See MAR. Pt tolerated well. Pt also aware of getting Rx Keflex from pharmacy to begin as well.   Pt advised to continue to increase water intake and monitor symptoms. Pt verbalized understanding and agreeable to plan of care.   Colletta Maryland, RN

## 2020-12-04 NOTE — Telephone Encounter (Signed)
Patient called back into front office returning our phone call. Patient states she completed antibiotics on Saturday and symptoms only improved minimally. She also states she feels like pyridium made it worse so she is not taking that anymore. Patient denies flank pain or fever/chills. Reports continued dysuria with difficulty emptying bladder. Discussed with Dr Rip Harbour who orders rocephin 500mg  x 1 in office today and keflex 500mg  TID x7 days. Patient will need repeat urine culture in 10 days following completion of antibiotics. Discussed with patient and offered appt at 2pm today for injection. Patient verbalized understanding to all.

## 2020-12-04 NOTE — Telephone Encounter (Signed)
Patient called into front office stating that the medication that was given to her is not helping and she wants something else sent in.  Attempted to call patient back several times, no answer- phone just continually rings without voicemail. Will send mychart message.

## 2020-12-05 MED ORDER — CEPHALEXIN 500 MG PO CAPS: 500.0000 mg | ORAL_CAPSULE | Freq: Three times a day (TID) | ORAL | 0 refills | Status: AC

## 2020-12-05 MED ORDER — CEPHALEXIN 500 MG PO CAPS: 500.0000 mg | ORAL_CAPSULE | Freq: Three times a day (TID) | ORAL | 0 refills | Status: DC

## 2020-12-05 NOTE — Progress Notes (Signed)
Agree with nurses's documentation of this patient's clinic encounter.  Jaleena Viviani L, MD  

## 2020-12-05 NOTE — Addendum Note (Signed)
Addended by: Georgia Lopes on: 12/05/2020 08:58 AM   Modules accepted: Orders

## 2020-12-05 NOTE — Progress Notes (Signed)
Pt called today asking for RTW note. Note given and sent to mychart. Pt aware.  Colletta Maryland, RN

## 2020-12-05 NOTE — Addendum Note (Signed)
Addended by: Georgia Lopes on: 12/05/2020 09:00 AM   Modules accepted: Orders

## 2020-12-13 ENCOUNTER — Ambulatory Visit: Payer: Medicaid Other | Admitting: Certified Nurse Midwife

## 2021-01-29 ENCOUNTER — Telehealth: Payer: Self-pay | Admitting: Family Medicine

## 2021-01-29 NOTE — Telephone Encounter (Signed)
Returned call to pt and heard message stating that the call could not be completed @ this time - call back later. One additional attempt will be made to contact pt. Pt can be informed that next available Gyn appt in this office is January 2023. If pt's symptoms are not Gyn related, she should be advised to contact PCP for evaluation. Per chart review, pt DNKA for PP exam on 9/7 and 10/13.

## 2021-01-29 NOTE — Telephone Encounter (Signed)
Patient delivered in august 2022, state she is having issues with her stomach, having a lot of pain a discomfort. Patient had a vaginal delivery.

## 2021-01-31 NOTE — Telephone Encounter (Signed)
I called [facility] preferred numer ( mobile ) and again heard a message call cannot be completed at this time. I called her home number and she said she is going to urgent care and can't talk right now because she is trying to get sleep while the baby sleeps. I advised her to call us back if needed. She voices understanding. Hiep Ollis,RN

## 2021-02-20 ENCOUNTER — Other Ambulatory Visit (HOSPITAL_COMMUNITY)
Admission: RE | Admit: 2021-02-20 | Discharge: 2021-02-20 | Disposition: A | Discharge status: Home / Self Care | Payer: Medicaid Other | Source: Ambulatory Visit | Attending: Family Medicine | Admitting: Family Medicine

## 2021-02-20 ENCOUNTER — Other Ambulatory Visit: Payer: Self-pay

## 2021-02-20 ENCOUNTER — Ambulatory Visit (INDEPENDENT_AMBULATORY_CARE_PROVIDER_SITE_OTHER): Payer: Medicaid Other

## 2021-02-20 VITALS — BP 148/81 | HR 79 | Wt 231.2 lb

## 2021-02-20 DIAGNOSIS — Z3200 Encounter for pregnancy test, result unknown: Secondary | ICD-10-CM

## 2021-02-20 DIAGNOSIS — N898 Other specified noninflammatory disorders of vagina: Secondary | ICD-10-CM | POA: Insufficient documentation

## 2021-02-20 DIAGNOSIS — Z3202 Encounter for pregnancy test, result negative: Secondary | ICD-10-CM

## 2021-02-20 LAB — POCT PREGNANCY, URINE: Preg Test, Ur: NEGATIVE

## 2021-02-20 NOTE — Progress Notes (Signed)
Here today with complaint of vaginal itching and odor. Reports this began recently. No change in partner. Requests STD screening. Test included for yeast and BV due to symptoms reported. Self-swab instructions given and specimen obtained. Patient also requests UPT; result negative. Explained our office will contact patient with any abnormal results.   Apolonio Schneiders RN 02/20/21

## 2021-02-20 NOTE — Progress Notes (Signed)
Patient was evaluated by nursing staff. Agree with assessment and plan.

## 2021-02-21 ENCOUNTER — Other Ambulatory Visit: Payer: Self-pay | Admitting: Family Medicine

## 2021-02-21 DIAGNOSIS — N760 Acute vaginitis: Secondary | ICD-10-CM

## 2021-02-21 LAB — CERVICOVAGINAL ANCILLARY ONLY
Bacterial Vaginitis (gardnerella): POSITIVE — AB
Candida Glabrata: NEGATIVE
Candida Vaginitis: NEGATIVE
Chlamydia: NEGATIVE
Comment: NEGATIVE
Comment: NEGATIVE
Comment: NEGATIVE
Comment: NEGATIVE
Comment: NEGATIVE
Comment: NORMAL
Neisseria Gonorrhea: NEGATIVE
Trichomonas: NEGATIVE

## 2021-02-21 MED ORDER — METRONIDAZOLE 500 MG PO TABS: 500.0000 mg | ORAL_TABLET | Freq: Two times a day (BID) | ORAL | 0 refills | Status: AC

## 2021-03-17 ENCOUNTER — Encounter (HOSPITAL_COMMUNITY): Payer: Self-pay | Admitting: Emergency Medicine

## 2021-03-17 ENCOUNTER — Other Ambulatory Visit: Payer: Self-pay

## 2021-03-17 ENCOUNTER — Emergency Department (HOSPITAL_COMMUNITY)
Admission: EM | Admit: 2021-03-17 | Discharge: 2021-03-17 | Disposition: A | Discharge status: Home / Self Care | Payer: Medicaid Other | Attending: Emergency Medicine | Admitting: Emergency Medicine

## 2021-03-17 DIAGNOSIS — M5431 Sciatica, right side: Secondary | ICD-10-CM | POA: Diagnosis not present

## 2021-03-17 DIAGNOSIS — M533 Sacrococcygeal disorders, not elsewhere classified: Secondary | ICD-10-CM | POA: Insufficient documentation

## 2021-03-17 LAB — POC URINE PREG, ED: Preg Test, Ur: NEGATIVE

## 2021-03-17 MED ORDER — HYDROCODONE-ACETAMINOPHEN 5-325 MG PO TABS
1.0000 | ORAL_TABLET | Freq: Once | ORAL | Status: AC
  Administered 2021-03-17: 1 via ORAL
  Filled 2021-03-17: qty 1

## 2021-03-17 MED ORDER — HYDROCODONE-ACETAMINOPHEN 5-325 MG PO TABS: 1.0000 | ORAL_TABLET | Freq: Four times a day (QID) | ORAL | 0 refills | Status: DC | PRN

## 2021-03-17 MED ORDER — KETOROLAC TROMETHAMINE 30 MG/ML IJ SOLN
30.0000 mg | Freq: Once | INTRAMUSCULAR | Status: AC
  Administered 2021-03-17: 30 mg via INTRAMUSCULAR
  Filled 2021-03-17: qty 1

## 2021-03-17 MED ORDER — PREDNISONE 50 MG PO TABS: 50.0000 mg | ORAL_TABLET | Freq: Every day | ORAL | 0 refills | Status: DC

## 2021-03-17 MED ORDER — CYCLOBENZAPRINE HCL 10 MG PO TABS: 10.0000 mg | ORAL_TABLET | Freq: Two times a day (BID) | ORAL | 0 refills | Status: DC | PRN

## 2021-03-17 NOTE — Discharge Instructions (Signed)
Take the medications as needed for pain.  Follow-up with a doctor if the symptoms or not improving in the next week

## 2021-03-17 NOTE — ED Triage Notes (Signed)
BIB EMS from home, complains of low back pain. States this has happened before and she was pregnant.

## 2021-03-17 NOTE — ED Provider Notes (Signed)
[facility] DEPT Provider Note   CSN: [id number] Arrival date & time: 03/17/21  1843     History  Right buttock pain  [name] [name] [name] [name] is a 36 y.o. female.  HPI  Patient presents to the ED with complaints of pain in the right buttock area.  Patient states this started the last day or so.  Pain is sharp but does not radiate down her leg.  She is not having any numbness or weakness.  She denies any abdominal pain.  Patient is concerned that she might be pregnant.  Her last menstrual period was somewhat irregular.  She had similar symptoms in the past when she was pregnant and that is why she is concerned.  Home Medications Prior to Admission medications   Medication Sig Start Date End Date Taking? Authorizing Provider  cyclobenzaprine (FLEXERIL) 10 MG tablet Take 1 tablet (10 mg total) by mouth 2 (two) times daily as needed for muscle spasms. 03/17/21  Yes Dorie Rank, MD  HYDROcodone-acetaminophen (NORCO/VICODIN) 5-325 MG tablet Take 1 tablet by mouth every 6 (six) hours as needed. 03/17/21  Yes Dorie Rank, MD  predniSONE (DELTASONE) 50 MG tablet Take 1 tablet (50 mg total) by mouth daily. 03/17/21  Yes Dorie Rank, MD  Prenatal Vit-Fe Fumarate-FA (MULTIVITAMIN-PRENATAL) 27-0.8 MG TABS tablet Take 1 tablet by mouth daily at 12 noon.    [provider]  benztropine (COGENTIN) 0.5 MG tablet Take 1 tablet (0.5 mg total) by mouth at bedtime. Patient taking differently: Take 0.5 mg by mouth daily.  08/04/14 06/27/19  Niel Hummer, NP  lamoTRIgine (LAMICTAL) 25 MG tablet Take 1 tablet (25 mg total) by mouth daily. For mood stability 08/04/14 06/27/19  Niel Hummer, NP  promethazine (PHENERGAN) 25 MG tablet Take 1 tablet (25 mg total) by mouth every 6 (six) hours as needed for nausea or vomiting. 04/25/16 06/27/19  Woodroe Mode, MD      Allergies    Ibuprofen    Review of Systems   Review of Systems  Constitutional:  Negative for fever.   Genitourinary:  Negative for dysuria.   Physical Exam Updated Vital Signs BP 117/89 (BP Location: Right Arm)    Pulse 71    Temp (!) 97.2 F (36.2 C) (Oral)    Resp 18    Ht 1.803 m (5\' 11" )    Wt 105 kg    SpO2 98%    BMI 32.29 kg/m  Physical Exam Vitals and nursing note reviewed.  Constitutional:      General: She is not in acute distress.    Appearance: She is well-developed.  HENT:     Head: Normocephalic and atraumatic.     Right Ear: External ear normal.     Left Ear: External ear normal.  Eyes:     General: No scleral icterus.       Right eye: No discharge.        Left eye: No discharge.     Conjunctiva/sclera: Conjunctivae normal.  Neck:     Trachea: No tracheal deviation.  Cardiovascular:     Rate and Rhythm: Normal rate.  Pulmonary:     Effort: Pulmonary effort is normal. No respiratory distress.     Breath sounds: No stridor.  Abdominal:     General: There is no distension.  Musculoskeletal:        General: No swelling or deformity.     Cervical back: Neck supple.     Comments: Tenderness palpation  right buttock region  Skin:    General: Skin is warm and dry.     Findings: No rash.  Neurological:     Mental Status: She is alert.     Cranial Nerves: Cranial nerve deficit: no gross deficits.     Comments: Normal strength and sensation bilateral lower extremities.    ED Results / Procedures / Treatments   Labs (all labs ordered are listed, but only abnormal results are displayed) Labs Reviewed  POC URINE PREG, ED     Procedures Procedures    Medications Ordered in ED Medications  HYDROcodone-acetaminophen (NORCO/VICODIN) 5-325 MG per tablet 1 tablet (has no administration in time range)  ketorolac (TORADOL) 30 MG/ML injection 30 mg (has no administration in time range)    ED Course/ Medical Decision Making/ A&P                           Medical Decision Making  Patient's pregnancy test is negative.  Her symptoms are suggestive of possibility  of sciatica.  No signs of any acute neurologic deficits.  Will discharge home with prescription occasions.  Discussed outpatient follow-up if symptoms do not resolve.        Final Clinical Impression(s) / ED Diagnoses Final diagnoses:  Sciatica of right side    Rx / DC Orders ED Discharge Orders          Ordered    predniSONE (DELTASONE) 50 MG tablet  Daily        03/17/21 1934    cyclobenzaprine (FLEXERIL) 10 MG tablet  2 times daily PRN        03/17/21 1934    HYDROcodone-acetaminophen (NORCO/VICODIN) 5-325 MG tablet  Every 6 hours PRN        03/17/21 1934              Dorie Rank, MD 03/17/21 1934

## 2021-04-29 ENCOUNTER — Ambulatory Visit: Payer: Medicaid Other | Admitting: Family Medicine

## 2021-04-29 ENCOUNTER — Encounter: Payer: Self-pay | Admitting: Family Medicine

## 2021-04-29 NOTE — Progress Notes (Signed)
Patient did not keep appointment today. She may call to reschedule.  

## 2021-07-04 ENCOUNTER — Encounter (HOSPITAL_COMMUNITY): Payer: Self-pay | Admitting: *Deleted

## 2021-07-04 ENCOUNTER — Other Ambulatory Visit: Payer: Self-pay

## 2021-07-04 ENCOUNTER — Inpatient Hospital Stay (HOSPITAL_COMMUNITY)
Admission: AD | Admit: 2021-07-04 | Discharge: 2021-07-04 | Disposition: A | Discharge status: Home / Self Care | Payer: Medicaid Other | Attending: Obstetrics & Gynecology | Admitting: Obstetrics & Gynecology

## 2021-07-04 DIAGNOSIS — Z3A01 Less than 8 weeks gestation of pregnancy: Secondary | ICD-10-CM

## 2021-07-04 DIAGNOSIS — O26891 Other specified pregnancy related conditions, first trimester: Secondary | ICD-10-CM

## 2021-07-04 DIAGNOSIS — O23591 Infection of other part of genital tract in pregnancy, first trimester: Secondary | ICD-10-CM | POA: Diagnosis not present

## 2021-07-04 DIAGNOSIS — O30041 Twin pregnancy, dichorionic/diamniotic, first trimester: Secondary | ICD-10-CM | POA: Diagnosis not present

## 2021-07-04 DIAGNOSIS — R102 Pelvic and perineal pain: Secondary | ICD-10-CM

## 2021-07-04 DIAGNOSIS — N760 Acute vaginitis: Secondary | ICD-10-CM

## 2021-07-04 DIAGNOSIS — O30091 Twin pregnancy, unable to determine number of placenta and number of amniotic sacs, first trimester: Secondary | ICD-10-CM

## 2021-07-04 DIAGNOSIS — B9689 Other specified bacterial agents as the cause of diseases classified elsewhere: Secondary | ICD-10-CM | POA: Diagnosis not present

## 2021-07-04 LAB — URINALYSIS, ROUTINE W REFLEX MICROSCOPIC
Bilirubin Urine: NEGATIVE
Glucose, UA: NEGATIVE mg/dL
Hgb urine dipstick: NEGATIVE
Ketones, ur: NEGATIVE mg/dL
Leukocytes,Ua: NEGATIVE
Nitrite: NEGATIVE
Protein, ur: NEGATIVE mg/dL
Specific Gravity, Urine: 1.023 (ref 1.005–1.030)
pH: 5 (ref 5.0–8.0)

## 2021-07-04 LAB — WET PREP, GENITAL
Sperm: NONE SEEN
Trich, Wet Prep: NONE SEEN
WBC, Wet Prep HPF POC: <10 (ref <10)
Yeast Wet Prep HPF POC: NONE SEEN

## 2021-07-04 LAB — POCT PREGNANCY, URINE: Preg Test, Ur: POSITIVE — AB

## 2021-07-04 MED ORDER — METRONIDAZOLE 500 MG PO TABS: 500.0000 mg | ORAL_TABLET | Freq: Two times a day (BID) | ORAL | 0 refills | Status: DC

## 2021-07-04 NOTE — MAU Note (Signed)
[name] [name] is a 36 y.o. at 7wks1day here in MAU reporting: pelvic pain that began several weeks ago.  Denies abdominal pain and VB.  +UPT x3 ?LMP: 05/15/2021 ?Onset of complaint: several weeks ago ?Pain score: 4/10 ?Vitals:  ? 07/04/21 1415  ?BP: 131/69  ?Pulse: 89  ?Resp: 18  ?Temp: 97.8 ?F (36.6 ?C)  ?SpO2: 98%  ?   ?FHT:N/A ?Lab orders placed from triage:   UPT & UA ?

## 2021-07-04 NOTE — Progress Notes (Signed)
Pt informed that the ultrasound is considered a limited OB ultrasound and is not intended to be a complete ultrasound exam.  Patient also informed that the ultrasound is not being completed with the intent of assessing for fetal or placental anomalies or any pelvic abnormalities.  Explained that the purpose of today?s ultrasound is to assess for  viability.  Patient acknowledges the purpose of the exam and the limitations of the study.   ? ?My read: viable twin IUP, appears dichorionic/diamniotic. Normal fetal cardiac activity observed for both fetuses.  ?

## 2021-07-04 NOTE — MAU Provider Note (Signed)
?History  ?  ? ?[id number] ? ?Arrival date and time: 07/04/21 1333 ?  ? ?Chief Complaint  ?Patient presents with  ? Pelvic Pain  ? ? ? ?HPI ?[name] [name] is a 36 y.o. at 65w1dby LMP who presents for pelvic pain. Reports left lower pelvic pain that has been an ongoing issue since the end of her last pregnancy (10/2020). Denies vaginal bleeding, dysuria. Reports vaginal discharge & irritation.  ? ?OB History   ? ? Gravida  ?2  ? Para  ?1  ? Term  ?1  ? Preterm  ?0  ? AB  ?0  ? Living  ?1  ?  ? ? SAB  ?0  ? IAB  ?0  ? Ectopic  ?0  ? Multiple  ?0  ? Live Births  ?1  ?   ?  ?  ? ? ?Past Medical History:  ?Diagnosis Date  ? Anxiety   ? [place]   ? Boils   ? Headache(784.0)   ? PCOS (polycystic ovarian syndrome) 02/26/2012  ? Recurrent UTI 05/15/2013  ? Neg 03/2020  E. Coli  ? Smoker 01/18/2019  ? ? ?Past Surgical History:  ?Procedure Laterality Date  ? LAPAROSCOPIC UNILATERAL SALPINGECTOMY Left   ? paratubal cystectomy  ? ? ?Family History  ?Problem Relation Age of Onset  ? Mental illness Mother   ? Thyroid disease Mother   ? Alcohol abuse Brother   ? Breast cancer Paternal Aunt   ? ? ?Allergies  ?Allergen Reactions  ? Ibuprofen Nausea And Vomiting  ?  Stomach upset  ? ? ?No current facility-administered medications on file prior to encounter.  ? ?Current Outpatient Medications on File Prior to Encounter  ?Medication Sig Dispense Refill  ? cyclobenzaprine (FLEXERIL) 10 MG tablet Take 1 tablet (10 mg total) by mouth 2 (two) times daily as needed for muscle spasms. 20 tablet 0  ? HYDROcodone-acetaminophen (NORCO/VICODIN) 5-325 MG tablet Take 1 tablet by mouth every 6 (six) hours as needed. 12 tablet 0  ? predniSONE (DELTASONE) 50 MG tablet Take 1 tablet (50 mg total) by mouth daily. 5 tablet 0  ? Prenatal Vit-Fe Fumarate-FA (MULTIVITAMIN-PRENATAL) 27-0.8 MG TABS tablet Take 1 tablet by mouth daily at 12 noon.    ? [DISCONTINUED] benztropine (COGENTIN) 0.5 MG tablet Take 1 tablet (0.5 mg total) by mouth at  bedtime. (Patient taking differently: Take 0.5 mg by mouth daily. ) 30 tablet 0  ? [DISCONTINUED] lamoTRIgine (LAMICTAL) 25 MG tablet Take 1 tablet (25 mg total) by mouth daily. For mood stability 30 tablet 0  ? [DISCONTINUED] promethazine (PHENERGAN) 25 MG tablet Take 1 tablet (25 mg total) by mouth every 6 (six) hours as needed for nausea or vomiting. 30 tablet 1  ? ? ? ?ROS ?Pertinent positives and negative per HPI, all others reviewed and negative ? ?Physical Exam  ? ?BP 131/69 (BP Location: Right Arm)   Pulse 89   Temp 97.8 ?F (36.6 ?C) (Oral)   Resp 18   Ht '5\' 11"'$  (1.803 m)   Wt 105.8 kg   LMP 05/15/2021   SpO2 98%   BMI 32.52 kg/m?  ? ?Patient Vitals for the past 24 hrs: ? BP Temp Temp src Pulse Resp SpO2 Height Weight  ?07/04/21 1415 131/69 97.8 ?F (36.6 ?C) Oral 89 18 98 % -- --  ?07/04/21 1405 -- -- -- -- -- -- '5\' 11"'$  (1.803 m) 105.8 kg  ? ? ?Physical Exam ?Vitals and nursing note reviewed.  ?Constitutional:   ?  General: She is not in acute distress. ?   Appearance: Normal appearance.  ?HENT:  ?   Head: Normocephalic and atraumatic.  ?Eyes:  ?   General: No scleral icterus. ?   Conjunctiva/sclera: Conjunctivae normal.  ?   Pupils: Pupils are equal, round, and reactive to light.  ?Pulmonary:  ?   Effort: Pulmonary effort is normal. No respiratory distress.  ?Abdominal:  ?   Palpations: Abdomen is soft.  ?   Tenderness: There is no abdominal tenderness. There is no guarding.  ?Skin: ?   General: Skin is warm and dry.  ?Neurological:  ?   General: No focal deficit present.  ?   Mental Status: She is alert.  ?Psychiatric:     ?   Mood and Affect: Mood normal.     ?   Behavior: Behavior normal.  ?  ? ?Labs ?Results for orders placed or performed during the hospital encounter of 07/04/21 (from the past 24 hour(s))  ?Pregnancy, urine POC     Status: Abnormal  ? Collection Time: 07/04/21  2:06 PM  ?Result Value Ref Range  ? Preg Test, Ur POSITIVE (A) NEGATIVE  ?Urinalysis, Routine w reflex microscopic  Urine, Clean Catch     Status: Abnormal  ? Collection Time: 07/04/21  2:08 PM  ?Result Value Ref Range  ? Color, Urine YELLOW YELLOW  ? APPearance HAZY (A) CLEAR  ? Specific Gravity, Urine 1.023 1.005 - 1.030  ? pH 5.0 5.0 - 8.0  ? Glucose, UA NEGATIVE NEGATIVE mg/dL  ? Hgb urine dipstick NEGATIVE NEGATIVE  ? Bilirubin Urine NEGATIVE NEGATIVE  ? Ketones, ur NEGATIVE NEGATIVE mg/dL  ? Protein, ur NEGATIVE NEGATIVE mg/dL  ? Nitrite NEGATIVE NEGATIVE  ? Leukocytes,Ua NEGATIVE NEGATIVE  ?Wet prep, genital     Status: Abnormal  ? Collection Time: 07/04/21  2:35 PM  ? Specimen: Vaginal  ?Result Value Ref Range  ? Yeast Wet Prep HPF POC NONE SEEN NONE SEEN  ? Trich, Wet Prep NONE SEEN NONE SEEN  ? Clue Cells Wet Prep HPF POC PRESENT (A) NONE SEEN  ? WBC, Wet Prep HPF POC <10 <10  ? Sperm NONE SEEN   ? ? ?Imaging ?No results found. ? ?MAU Course  ?Procedures ?Lab Orders    ?     Wet prep, genital    ?     Urinalysis, Routine w reflex microscopic Urine, Clean Catch    ?     Pregnancy, urine POC    ? ?Meds ordered this encounter  ?Medications  ? metroNIDAZOLE (FLAGYL) 500 MG tablet  ?  Sig: Take 1 tablet (500 mg total) by mouth 2 (two) times daily.  ?  Dispense:  14 tablet  ?  Refill:  0  ?  Order Specific Question:   Supervising Provider  ?  AnswerClarnce Flock [[id number]]  ? ?Imaging Orders  ?No imaging studies ordered today  ? ? ?MDM ?BSUS performed by Dr. Dione Plover (see his note) showing di/di twin IUP. Will order f/u outpatient ultrasound to confirm dating.  ? ?Wet prep positive for clue cells. Patient reports increase in discharge & requesting treatment for BV ?U/a negative ? ?Assessment and Plan  ? ?1. Pelvic pain affecting pregnancy in first trimester, antepartum  ?-Reviewed SAB precautions & reasons to return to MAU ?-Message to [place] for dating ultrasound   ?2. Dichorionic diamniotic twin pregnancy in first trimester   ?3. Bacterial vaginosis  ?-Rx flagyl ?-GC/CG pending  ?4. [redacted] weeks  gestation of pregnancy    ? ? ? ?Jorje Guild, NP ?07/04/21 ?3:33 PM ? ? ?

## 2021-07-04 NOTE — Discharge Instructions (Signed)
Return to care  If you have heavier bleeding that soaks through more than 2 pads per hour for an hour or more If you bleed so much that you feel like you might pass out or you do pass out If you have significant abdominal pain that is not improved with Tylenol   

## 2021-07-04 NOTE — Progress Notes (Signed)
Ultrasound done by Dr. Dione Plover in triage.  Gestational sac x2 noted with FHR's  noted. ?

## 2021-07-05 LAB — GC/CHLAMYDIA PROBE AMP (~~LOC~~) NOT AT ARMC
Chlamydia: NEGATIVE
Comment: NEGATIVE
Comment: NORMAL
Neisseria Gonorrhea: NEGATIVE

## 2021-07-09 ENCOUNTER — Inpatient Hospital Stay (HOSPITAL_COMMUNITY): Payer: Medicaid Other

## 2021-07-09 ENCOUNTER — Other Ambulatory Visit: Payer: Self-pay

## 2021-07-09 ENCOUNTER — Inpatient Hospital Stay (HOSPITAL_COMMUNITY)
Admission: AD | Admit: 2021-07-09 | Discharge: 2021-07-09 | Disposition: A | Discharge status: Home / Self Care | Payer: Medicaid Other | Attending: Obstetrics and Gynecology | Admitting: Obstetrics and Gynecology

## 2021-07-09 DIAGNOSIS — Z3A01 Less than 8 weeks gestation of pregnancy: Secondary | ICD-10-CM | POA: Insufficient documentation

## 2021-07-09 DIAGNOSIS — O208 Other hemorrhage in early pregnancy: Secondary | ICD-10-CM | POA: Insufficient documentation

## 2021-07-09 DIAGNOSIS — O3481 Maternal care for other abnormalities of pelvic organs, first trimester: Secondary | ICD-10-CM | POA: Diagnosis not present

## 2021-07-09 DIAGNOSIS — O30041 Twin pregnancy, dichorionic/diamniotic, first trimester: Secondary | ICD-10-CM | POA: Diagnosis not present

## 2021-07-09 DIAGNOSIS — O09521 Supervision of elderly multigravida, first trimester: Secondary | ICD-10-CM | POA: Insufficient documentation

## 2021-07-09 DIAGNOSIS — O26891 Other specified pregnancy related conditions, first trimester: Secondary | ICD-10-CM | POA: Diagnosis present

## 2021-07-09 DIAGNOSIS — R109 Unspecified abdominal pain: Secondary | ICD-10-CM | POA: Diagnosis present

## 2021-07-09 DIAGNOSIS — N83202 Unspecified ovarian cyst, left side: Secondary | ICD-10-CM | POA: Diagnosis not present

## 2021-07-09 DIAGNOSIS — O418X12 Other specified disorders of amniotic fluid and membranes, first trimester, fetus 2: Secondary | ICD-10-CM

## 2021-07-09 LAB — URINALYSIS, ROUTINE W REFLEX MICROSCOPIC
Bilirubin Urine: NEGATIVE
Glucose, UA: NEGATIVE mg/dL
Hgb urine dipstick: NEGATIVE
Ketones, ur: NEGATIVE mg/dL
Nitrite: NEGATIVE
Protein, ur: NEGATIVE mg/dL
Specific Gravity, Urine: 1.019 (ref 1.005–1.030)
pH: 5 (ref 5.0–8.0)

## 2021-07-09 LAB — CBC
HCT: 37.2 % (ref 36.0–46.0)
Hemoglobin: 12.9 g/dL (ref 12.0–15.0)
MCH: 32.1 pg (ref 26.0–34.0)
MCHC: 34.7 g/dL (ref 30.0–36.0)
MCV: 92.5 fL (ref 80.0–100.0)
Platelets: 283 10*3/uL (ref 150–400)
RBC: 4.02 MIL/uL (ref 3.87–5.11)
RDW: 12 % (ref 11.5–15.5)
WBC: 10.8 10*3/uL — ABNORMAL HIGH (ref 4.0–10.5)
nRBC: 0 % (ref 0.0–0.2)

## 2021-07-09 LAB — POCT PREGNANCY, URINE: Preg Test, Ur: POSITIVE — AB

## 2021-07-09 NOTE — MAU Note (Signed)
[name] [name] is a 36 y.o. at 56w6dhere in MAU reporting: pelvic pain on the left side.  Reports pain is intermittent and sharp.  Reports pain is worsening and she thinks she needs a ultrasound.  Denies VB ? ?Onset of complaint: last week ?Pain score: 7/10 ?Vitals:  ? 07/09/21 1059  ?BP: 128/78  ?Pulse: 77  ?Resp: 18  ?Temp: 98.2 ?F (36.8 ?C)  ?SpO2: 96%  ?   ?FHT:N/A ?Lab orders placed from triage:   UA ?
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
## 2021-07-15 ENCOUNTER — Telehealth: Payer: Self-pay | Admitting: Family Medicine

## 2021-07-16 ENCOUNTER — Telehealth: Payer: Self-pay | Admitting: General Practice

## 2021-07-16 ENCOUNTER — Other Ambulatory Visit: Payer: Medicaid Other

## 2021-07-16 NOTE — Telephone Encounter (Signed)
Patient called into front office with several questions/concerns regarding her health and her babies. Patient states she was seen in the ER recently and told she has a pretty large cyst on her ovary. She is concerned about this because in the past she had a cyst that she had to be rushed into surgery for. Patient states she has had brown spotting off/on but this morning it was bright red when wiping. She is concerned about her next appt being all the way in June. Patient states they also talked to her about genetic testing but an order hasn't been placed for this. Discussed with Dr Dione Plover who states sooner ultrasound/intervention for cyst isn't needed especially given pregnancy. Patient is fine to follow up in June but should return to MAU if bleeding becomes heavy like a period or patient has severe pain. Discussed with patient her cyst was a decent size but cysts are common and usually go away on their own without intervention. Told patient we especially do not usually intervene in pregnancy and the best recommendation right now is follow up ultrasound in 6-12 weeks unless something changes. Reviewed ultrasound showing [facility] which can cause spotting/light bleeding. Advised to return to MAU if bleeding becomes heavy like a period or she has severe pain. Offered new OB intake appt in person next week for reassurance and discussed genetic testing can be done then with other needed bloodwork. Patient verbalized understanding to all & was appreciative. Patient had no other questions at this time. ?

## 2021-07-21 ENCOUNTER — Other Ambulatory Visit: Payer: Self-pay

## 2021-07-21 ENCOUNTER — Inpatient Hospital Stay (HOSPITAL_COMMUNITY)
Admission: AD | Admit: 2021-07-21 | Discharge: 2021-07-21 | Disposition: A | Discharge status: Home / Self Care | Payer: Medicaid Other | Attending: Obstetrics and Gynecology | Admitting: Obstetrics and Gynecology

## 2021-07-21 ENCOUNTER — Encounter (HOSPITAL_COMMUNITY): Payer: Self-pay | Admitting: Obstetrics and Gynecology

## 2021-07-21 DIAGNOSIS — R1032 Left lower quadrant pain: Secondary | ICD-10-CM | POA: Diagnosis not present

## 2021-07-21 DIAGNOSIS — O26891 Other specified pregnancy related conditions, first trimester: Secondary | ICD-10-CM | POA: Insufficient documentation

## 2021-07-21 DIAGNOSIS — O3481 Maternal care for other abnormalities of pelvic organs, first trimester: Secondary | ICD-10-CM | POA: Diagnosis not present

## 2021-07-21 DIAGNOSIS — O418X10 Other specified disorders of amniotic fluid and membranes, first trimester, not applicable or unspecified: Secondary | ICD-10-CM | POA: Diagnosis not present

## 2021-07-21 DIAGNOSIS — O09521 Supervision of elderly multigravida, first trimester: Secondary | ICD-10-CM | POA: Diagnosis not present

## 2021-07-21 DIAGNOSIS — O208 Other hemorrhage in early pregnancy: Secondary | ICD-10-CM | POA: Insufficient documentation

## 2021-07-21 DIAGNOSIS — O468X1 Other antepartum hemorrhage, first trimester: Secondary | ICD-10-CM | POA: Diagnosis not present

## 2021-07-21 DIAGNOSIS — N83209 Unspecified ovarian cyst, unspecified side: Secondary | ICD-10-CM | POA: Diagnosis not present

## 2021-07-21 DIAGNOSIS — Z3A09 9 weeks gestation of pregnancy: Secondary | ICD-10-CM | POA: Diagnosis not present

## 2021-07-21 LAB — URINALYSIS, ROUTINE W REFLEX MICROSCOPIC
Bilirubin Urine: NEGATIVE
Glucose, UA: NEGATIVE mg/dL
Hgb urine dipstick: NEGATIVE
Ketones, ur: NEGATIVE mg/dL
Leukocytes,Ua: NEGATIVE
Nitrite: NEGATIVE
Protein, ur: NEGATIVE mg/dL
Specific Gravity, Urine: 1.031 — ABNORMAL HIGH (ref 1.005–1.030)
pH: 5 (ref 5.0–8.0)

## 2021-07-21 NOTE — Discharge Instructions (Signed)
Return to care  If you have heavier bleeding that soaks through more than 2 pads per hour for an hour or more If you bleed so much that you feel like you might pass out or you do pass out If you have significant abdominal pain that is not improved with Tylenol   

## 2021-07-21 NOTE — MAU Provider Note (Signed)
History     [id number]  Arrival date and time: 07/21/21 1550    Chief Complaint  Patient presents with   Vaginal Bleeding   left groin     HPI [name] [name] is a 36 y.o. at 56w4dwho presents for vaginal bleeding. Reports red spotting x 3 days. Also reports pain in left groin that has been ongoing last year. Not passing blood clots or bleeding into pads. Denies fever, vomiting, diarrhea, or constipation.  Attributes pain to known ovarian cyst. No change in pain.   OB History     Gravida  2   Para  1   Term  1   Preterm  0   AB  0   Living  1      SAB  0   IAB  0   Ectopic  0   Multiple  0   Live Births  1           Past Medical History:  Diagnosis Date   Anxiety    [place] disorder [place]    Boils    Headache(784.0)    PCOS (polycystic ovarian syndrome) 02/26/2012   Recurrent UTI 05/15/2013   Neg 03/2020  E. Coli   Smoker 01/18/2019    Past Surgical History:  Procedure Laterality Date   LAPAROSCOPIC UNILATERAL SALPINGECTOMY Left    paratubal cystectomy    Family History  Problem Relation Age of Onset   Mental illness Mother    Thyroid disease Mother    Alcohol abuse Brother    Breast cancer Paternal Aunt     Allergies  Allergen Reactions   Ibuprofen Nausea And Vomiting    Stomach upset    No current facility-administered medications on file prior to encounter.   Current Outpatient Medications on File Prior to Encounter  Medication Sig Dispense Refill   Prenatal Vit-Fe Fumarate-FA (MULTIVITAMIN-PRENATAL) 27-0.8 MG TABS tablet Take 1 tablet by mouth daily at 12 noon.     [DISCONTINUED] benztropine (COGENTIN) 0.5 MG tablet Take 1 tablet (0.5 mg total) by mouth at bedtime. (Patient taking differently: Take 0.5 mg by mouth daily. ) 30 tablet 0   [DISCONTINUED] lamoTRIgine (LAMICTAL) 25 MG tablet Take 1 tablet (25 mg total) by mouth daily. For mood stability 30 tablet 0   [DISCONTINUED] promethazine (PHENERGAN) 25 MG tablet Take 1  tablet (25 mg total) by mouth every 6 (six) hours as needed for nausea or vomiting. 30 tablet 1     ROS Pertinent positives and negative per HPI, all others reviewed and negative  Physical Exam   BP 124/69   Pulse 96   Temp 98.5 F (36.9 C) (Oral)   Resp 18   Ht '5\' 11"'$  (1.803 m)   Wt 104.3 kg   LMP 05/15/2021   SpO2 98%   BMI 32.08 kg/m   Patient Vitals for the past 24 hrs:  BP Temp Temp src Pulse Resp SpO2 Height Weight  07/21/21 1909 124/69 -- -- 96 -- -- -- --  07/21/21 1839 122/88 -- -- 88 -- -- -- --  07/21/21 1819 137/89 98.5 F (36.9 C) Oral (!) 102 18 98 % -- --  07/21/21 1811 -- -- -- -- -- -- '5\' 11"'$  (1.803 m) 104.3 kg    Physical Exam Vitals and nursing note reviewed.  Constitutional:      General: She is not in acute distress.    Appearance: Normal appearance.  HENT:     Head: Normocephalic and atraumatic.  Eyes:  General: No scleral icterus.    Conjunctiva/sclera: Conjunctivae normal.  Pulmonary:     Effort: Pulmonary effort is normal. No respiratory distress.  Abdominal:     Palpations: Abdomen is soft.     Tenderness: There is no abdominal tenderness. There is no guarding or rebound.  Skin:    General: Skin is warm and dry.  Neurological:     Mental Status: She is alert.  Psychiatric:        Mood and Affect: Mood normal.        Behavior: Behavior normal.     Bedside Ultrasound Pt informed that the ultrasound is considered a limited OB ultrasound and is not intended to be a complete ultrasound exam.  Patient also informed that the ultrasound is not being completed with the intent of assessing for fetal or placental anomalies or any pelvic abnormalities.  Explained that the purpose of today's ultrasound is to assess for  viability.  Patient acknowledges the purpose of the exam and the limitations of the study.     My interpretation: Twin pregnancy, FHR 150/162 bpm   Labs Results for orders placed or performed during the hospital encounter  of 07/21/21 (from the past 24 hour(s))  Urinalysis, Routine w reflex microscopic Urine, Clean Catch     Status: Abnormal   Collection Time: 07/21/21  6:48 PM  Result Value Ref Range   Color, Urine YELLOW YELLOW   APPearance HAZY (A) CLEAR   Specific Gravity, Urine 1.031 (H) 1.005 - 1.030   pH 5.0 5.0 - 8.0   Glucose, UA NEGATIVE NEGATIVE mg/dL   Hgb urine dipstick NEGATIVE NEGATIVE   Bilirubin Urine NEGATIVE NEGATIVE   Ketones, ur NEGATIVE NEGATIVE mg/dL   Protein, ur NEGATIVE NEGATIVE mg/dL   Nitrite NEGATIVE NEGATIVE   Leukocytes,Ua NEGATIVE NEGATIVE    Imaging No results found.  MAU Course  Procedures Lab Orders         Urinalysis, Routine w reflex microscopic Urine, Clean Catch    No orders of the defined types were placed in this encounter.  Imaging Orders  No imaging studies ordered today    MDM Patient with know subchorionic hemorrhage, previously seen for vaginal bleeding. No active bleeding now. RH positive. FHT present per ultrasound.   Has known ovarian cyst. No worsening symptoms. Patient in no distress. Low concern for torsion.  Assessment and Plan   1. Subchorionic hematoma in first trimester, single or unspecified fetus  -pelvic rest -reviewed bleeding precautions & reasons to return to MAU  2. Ovarian cyst during pregnancy in first trimester  -reviewed torsion precautions  3. [redacted] weeks gestation of pregnancy      Jorje Guild, NP 07/21/21 7:33 PM

## 2021-07-21 NOTE — MAU Note (Signed)
Gracelynn Bircher is a 36 y.o. at 56w4dhere in MAU reporting: VB and pain in left groin due to ovarian cyst. States VB is small, began 2 days ago but not passing clots.    Onset of complaint: 2 days ago Pain score: 0 Vitals:   07/21/21 1819  BP: 137/89  Pulse: (!) 102  Resp: 18  Temp: 98.5 F (36.9 C)  SpO2: 98%     FHT:N/A Lab orders placed from triage:

## 2021-07-24 ENCOUNTER — Encounter: Payer: Self-pay | Admitting: Obstetrics and Gynecology

## 2021-07-24 ENCOUNTER — Other Ambulatory Visit: Payer: Self-pay | Admitting: Certified Nurse Midwife

## 2021-07-24 ENCOUNTER — Ambulatory Visit (INDEPENDENT_AMBULATORY_CARE_PROVIDER_SITE_OTHER): Payer: Medicaid Other

## 2021-07-24 ENCOUNTER — Other Ambulatory Visit (HOSPITAL_COMMUNITY)
Admission: RE | Admit: 2021-07-24 | Discharge: 2021-07-24 | Disposition: A | Discharge status: Home / Self Care | Payer: Medicaid Other | Source: Ambulatory Visit | Attending: Certified Nurse Midwife | Admitting: Certified Nurse Midwife

## 2021-07-24 DIAGNOSIS — O0990 Supervision of high risk pregnancy, unspecified, unspecified trimester: Secondary | ICD-10-CM | POA: Diagnosis present

## 2021-07-24 DIAGNOSIS — Z3A00 Weeks of gestation of pregnancy not specified: Secondary | ICD-10-CM | POA: Diagnosis not present

## 2021-07-24 DIAGNOSIS — O30009 Twin pregnancy, unspecified number of placenta and unspecified number of amniotic sacs, unspecified trimester: Secondary | ICD-10-CM | POA: Insufficient documentation

## 2021-07-24 DIAGNOSIS — O30049 Twin pregnancy, dichorionic/diamniotic, unspecified trimester: Secondary | ICD-10-CM | POA: Insufficient documentation

## 2021-07-24 DIAGNOSIS — N83202 Unspecified ovarian cyst, left side: Secondary | ICD-10-CM | POA: Insufficient documentation

## 2021-07-24 NOTE — Progress Notes (Addendum)
New OB Intake Patient came in person for her New Ob Intake.  I discussed the limitations, risks, security and privacy concerns of performing an evaluation and management service by telephone and the availability of in person appointments. I also discussed with the patient that there may be a patient responsible charge related to this service. The patient expressed understanding and agreed to proceed.  I explained I am completing New OB Intake today. We discussed her EDD of 02/19/2022 that is based on LMP of 05/15/2021. Pt is G2/P1. I reviewed her allergies, medications, Medical/Surgical/OB history, and appropriate screenings. I informed her of [facility] services. Based on history, this is an pregnancy  complicated by :   Patient Active Problem List   Diagnosis Date Noted   Twin pregnancy 07/24/2021   Left ovarian cyst 07/24/2021   Supervision of high risk pregnancy, antepartum 07/24/2021   Labral tear of left hip joint 09/10/2020   Increased SMA carrier risk on Horizon testing 08/06/2020   Hip instability, left 07/02/2020   AMA (advanced maternal age) multigravida 35+ 03/23/2020   Tobacco use affecting pregnancy, antepartum 03/23/2020   Bipolar disorder, curr episode mixed, severe, with psychotic features ([place]) 08/02/2014   Cannabis use disorder, severe, dependence ([place]) 08/02/2014   Polysubstance abuse ([place]) 05/12/2013    Concerns addressed today  Delivery Plans:  Plans to deliver at [facility].   MyChart/Babyscripts MyChart access verified. I explained pt will have some visits in office and some virtually. Babyscripts instructions given and order placed. Patient verifies receipt of registration text/e-mail. Account successfully created and app downloaded.  Blood Pressure Cuff  Patient still has her previous BP cuff from her last pregnancy.   Weight scale: Patient does weight scale. Weight scale ordered for patient to pick up from [employer].   Anatomy US Explained first scheduled  [place] will be around 19 weeks. Anatomy US scheduled for 09/25/2021 at 10:30AM. Pt notified to arrive at 10:15AM.  Labs Discussed [place]ater[place] genetic screening with patient. Would like Panorama drawn at new OB visit. Also if interested in genetic testing, tell patient she will need AFP 15-21 weeks to complete genetic testing .Routine prenatal labs needed.  Covid Vaccine Patient has not covid vaccine.   Is patient a CenteringPregnancy candidate?  Declined Declined due to Childcare  Is patient a Mom+Baby Combined Care candidate?  Not a candidate    Is patient interested in [place]?  No   Informed patient of [facility] website  and placed link in her AVS.   Social Determinants of Health Food Insecurity: Patient denies food insecurity. WIC Referral: Patient is interested in referral to [facility].  Transportation: Patient denies transportation needs. Childcare: Discussed no children allowed at ultrasound appointments. Offered childcare services; patient declines childcare services at this time.  Send link to Pregnancy Navigators   Placed OB Box on problem list and updated  First visit review I reviewed new OB appt with pt. I explained she will have a pelvic exam, ob bloodwork with genetic screening, and PAP smear. Explained pt will be seen by Dr. Dione Plover MD at first visit; encounter routed to appropriate provider. Explained that patient will be seen by pregnancy navigator following visit with provider. [facility] information placed in AVS.   Mariane Baumgarten, CMA 07/24/2021  1:13 PM

## 2021-07-24 NOTE — Progress Notes (Signed)
Informal bedside ultrasound performed to assess FHRs, FHR 155bpm & 159bpm

## 2021-07-25 LAB — CBC/D/PLT+RPR+RH+ABO+RUBIGG...
Antibody Screen: NEGATIVE
Basophils Absolute: 0 10*3/uL (ref 0.0–0.2)
Basos: 0 %
EOS (ABSOLUTE): 0.2 10*3/uL (ref 0.0–0.4)
Eos: 1 %
HCV Ab: NONREACTIVE
HIV Screen 4th Generation wRfx: NONREACTIVE
Hematocrit: 37.1 % (ref 34.0–46.6)
Hemoglobin: 13.1 g/dL (ref 11.1–15.9)
Hepatitis B Surface Ag: NEGATIVE
Immature Grans (Abs): 0 10*3/uL (ref 0.0–0.1)
Immature Granulocytes: 0 %
Lymphocytes Absolute: 1.9 10*3/uL (ref 0.7–3.1)
Lymphs: 16 %
MCH: 32.9 pg (ref 26.6–33.0)
MCHC: 35.3 g/dL (ref 31.5–35.7)
MCV: 93 fL (ref 79–97)
Monocytes Absolute: 0.8 10*3/uL (ref 0.1–0.9)
Monocytes: 7 %
Neutrophils Absolute: 9.3 10*3/uL — ABNORMAL HIGH (ref 1.4–7.0)
Neutrophils: 76 %
Platelets: 289 10*3/uL (ref 150–450)
RBC: 3.98 x10E6/uL (ref 3.77–5.28)
RDW: 12.1 % (ref 11.7–15.4)
RPR Ser Ql: NONREACTIVE
Rh Factor: POSITIVE
Rubella Antibodies, IGG: 1.21 index (ref >0.99)
WBC: 12.3 10*3/uL — ABNORMAL HIGH (ref 3.4–10.8)

## 2021-07-25 LAB — GC/CHLAMYDIA PROBE AMP (~~LOC~~) NOT AT ARMC
Chlamydia: NEGATIVE
Comment: NEGATIVE
Comment: NORMAL
Neisseria Gonorrhea: NEGATIVE

## 2021-07-25 LAB — HEMOGLOBIN A1C
Est. average glucose Bld gHb Est-mCnc: 100 mg/dL
Hgb A1c MFr Bld: 5.1 % (ref 4.8–5.6)

## 2021-07-25 LAB — HCV INTERPRETATION

## 2021-07-26 ENCOUNTER — Encounter: Payer: Self-pay | Admitting: *Deleted

## 2021-07-26 LAB — URINE CULTURE, OB REFLEX

## 2021-07-26 LAB — CULTURE, OB URINE

## 2021-08-01 ENCOUNTER — Encounter: Payer: Self-pay | Admitting: *Deleted

## 2021-08-13 ENCOUNTER — Ambulatory Visit (INDEPENDENT_AMBULATORY_CARE_PROVIDER_SITE_OTHER): Payer: Medicaid Other | Admitting: Family Medicine

## 2021-08-13 ENCOUNTER — Encounter: Payer: Self-pay | Admitting: Certified Nurse Midwife

## 2021-08-13 ENCOUNTER — Encounter: Payer: Self-pay | Admitting: Family Medicine

## 2021-08-13 VITALS — BP 113/75 | HR 95 | Wt 230.8 lb

## 2021-08-13 DIAGNOSIS — O285 Abnormal chromosomal and genetic finding on antenatal screening of mother: Secondary | ICD-10-CM

## 2021-08-13 DIAGNOSIS — O99330 Smoking (tobacco) complicating pregnancy, unspecified trimester: Secondary | ICD-10-CM

## 2021-08-13 DIAGNOSIS — O30002 Twin pregnancy, unspecified number of placenta and unspecified number of amniotic sacs, second trimester: Secondary | ICD-10-CM

## 2021-08-13 DIAGNOSIS — F1910 Other psychoactive substance abuse, uncomplicated: Secondary | ICD-10-CM

## 2021-08-13 DIAGNOSIS — O0990 Supervision of high risk pregnancy, unspecified, unspecified trimester: Secondary | ICD-10-CM

## 2021-08-13 DIAGNOSIS — F3164 Bipolar disorder, current episode mixed, severe, with psychotic features: Secondary | ICD-10-CM

## 2021-08-13 DIAGNOSIS — O09522 Supervision of elderly multigravida, second trimester: Secondary | ICD-10-CM

## 2021-08-13 MED ORDER — NICOTINE POLACRILEX 2 MG MT LOZG: 2.0000 mg | LOZENGE | OROMUCOSAL | 0 refills | Status: DC | PRN

## 2021-08-13 NOTE — Progress Notes (Signed)
Subjective:   [name] [name] is a 36 y.o. G2P1001 at 56w6dby LMP, c/w 7 week UKoreabeing seen today for her first obstetrical visit.  Her obstetrical history is significant for advanced maternal age and hx of use disorder, tobacco use, short interval pregnancy, and di/di twin pregnancy . Patient does not intend to breast feed. Pregnancy history fully reviewed.  Patient reports  anxiety .  HISTORY: OB History  Gravida Para Term Preterm AB Living  '2 1 1 '$ 0 0 1  SAB IAB Ectopic Multiple Live Births  0 0 0 0 1    # Outcome Date GA Lbr Len/2nd Weight Sex Delivery Anes PTL Lv  2 Current           1 Term 10/07/20 367w2d  Vag-Vacuum   LIV     Last pap smear: No results found for: "DIAGPAP", "HPV", "HPZoarIn Care Everywhere: 05/25/2019 NILM, neg HPV  Past Medical History:  Diagnosis Date   Anxiety    Bipolar disorder ([place]   Boils    Headache(784.0)    PCOS (polycystic ovarian syndrome) 02/26/2012   Recurrent UTI 05/15/2013   Neg 03/2020  E. Coli   Smoker 01/18/2019   Past Surgical History:  Procedure Laterality Date   LAPAROSCOPIC UNILATERAL SALPINGECTOMY Left    paratubal cystectomy   Family History  Problem Relation Age of Onset   Mental illness Mother    Thyroid disease Mother    Alcohol abuse Brother    Breast cancer Paternal Aunt    Social History   Tobacco Use   Smoking status: Every Day    Packs/day: 0.25    Types: Cigarettes   Smokeless tobacco: Never  Vaping Use   Vaping Use: Former   Substances: THC  Substance Use Topics   Alcohol use: Not Currently    Comment: rare   Drug use: Yes    Types: Marijuana    Comment: smokes marijuana twice a week and no cocaine for almost a yearLAST  USE OF COCAINE MARCH,  LAST  MARIJUANA-  YESTERDAY    Allergies  Allergen Reactions   Ibuprofen Nausea And Vomiting    Stomach upset   Current Outpatient Medications on File Prior to Visit  Medication Sig Dispense Refill   Prenatal Vit-Fe Fumarate-FA  (MULTIVITAMIN-PRENATAL) 27-0.8 MG TABS tablet Take 1 tablet by mouth daily at 12 noon.     [DISCONTINUED] benztropine (COGENTIN) 0.5 MG tablet Take 1 tablet (0.5 mg total) by mouth at bedtime. (Patient taking differently: Take 0.5 mg by mouth daily. ) 30 tablet 0   [DISCONTINUED] lamoTRIgine (LAMICTAL) 25 MG tablet Take 1 tablet (25 mg total) by mouth daily. For mood stability 30 tablet 0   [DISCONTINUED] promethazine (PHENERGAN) 25 MG tablet Take 1 tablet (25 mg total) by mouth every 6 (six) hours as needed for nausea or vomiting. 30 tablet 1   No current facility-administered medications on file prior to visit.     Exam   Vitals:   08/13/21 1028  BP: 113/75  Pulse: 95  Weight: 230 lb 12.8 oz (104.7 kg)   Fetal Heart Rate (bpm): 133/139  System: General: well-developed, well-nourished female in no acute distress   Skin: normal coloration and turgor, no rashes   Neurologic: oriented, normal, negative, normal mood   Extremities: normal strength, tone, and muscle mass, ROM of all joints is normal   HEENT PERRLA, extraocular movement intact and sclera clear, anicteric   Neck supple and  no masses   Respiratory:  no respiratory distress      Assessment:   Pregnancy: G2P1001 Patient Active Problem List   Diagnosis Date Noted   Twin pregnancy 07/24/2021   Left ovarian cyst 07/24/2021   Supervision of high risk pregnancy, antepartum 07/24/2021   Labral tear of left hip joint 09/10/2020   Increased SMA carrier risk on Horizon testing 08/06/2020   Hip instability, left 07/02/2020   AMA (advanced maternal age) multigravida 35+ 03/23/2020   Tobacco use affecting pregnancy, antepartum 03/23/2020   Bipolar disorder, curr episode mixed, severe, with psychotic features ([place]) 08/02/2014   Cannabis use disorder, severe, dependence ([place]) 08/02/2014   Polysubstance abuse ([place]) 05/12/2013     Plan:  1. Supervision of high risk pregnancy, antepartum BP and FHR's both normal Initial labs  drawn. Continue prenatal vitamins. Genetic Screening discussed, NIPS: results reviewed. Ultrasound discussed; fetal anatomic survey: ordered. Problem list reviewed and updated. The nature of [place] with multiple MDs and other Advanced Practice Providers was explained to patient; also emphasized that residents, students are part of our team. - [facility] AMB BABYSCRIPTS SCHEDULE OPTIMIZATION  2. Twin gestation in second trimester, unspecified multiple gestation type Confirmed di/di twin gestation on 7 wk [place] as well as NIPT To follow w MFM  52. Multigravida of advanced maternal age in second trimester   4. Tobacco use affecting pregnancy, antepartum Still smoking loosies from time to time Lozenges sent, encouraged to quit  5. Bipolar disorder, curr episode mixed, severe, with psychotic features ([place]) Significant symptoms of anxiety Diagnosis not totally clear Agrees to [facility] referral to [place], but also recommended she go to [facility] visit across the street to establish psychiatric care, she agrees with this  6. Polysubstance abuse ([place]) Using THC vaping pens only Recommend stopping  7. Increased SMA carrier risk on Horizon testing Will discuss testing with partner   Routine obstetric precautions reviewed. Return in 4 weeks (on 09/10/2021) for [facility], ob visit, needs MD.

## 2021-08-13 NOTE — Patient Instructions (Signed)

## 2021-08-15 ENCOUNTER — Telehealth: Payer: Self-pay | Admitting: Clinical

## 2021-08-15 NOTE — Telephone Encounter (Signed)
Attempt call regarding referral; Left HIPPA-compliant message to call back Taya Ashbaugh from [facility] at  [phone] (Jaelyn Cloninger's office).    

## 2021-09-09 ENCOUNTER — Encounter: Payer: Self-pay | Admitting: Family Medicine

## 2021-09-09 ENCOUNTER — Other Ambulatory Visit: Payer: Self-pay

## 2021-09-09 ENCOUNTER — Ambulatory Visit (INDEPENDENT_AMBULATORY_CARE_PROVIDER_SITE_OTHER): Payer: Medicaid Other | Admitting: Obstetrics and Gynecology

## 2021-09-09 ENCOUNTER — Encounter: Payer: Self-pay | Admitting: *Deleted

## 2021-09-09 ENCOUNTER — Telehealth: Payer: Self-pay | Admitting: *Deleted

## 2021-09-09 VITALS — BP 111/61 | HR 85 | Wt 229.3 lb

## 2021-09-09 DIAGNOSIS — R102 Pelvic and perineal pain: Secondary | ICD-10-CM

## 2021-09-09 DIAGNOSIS — O09892 Supervision of other high risk pregnancies, second trimester: Secondary | ICD-10-CM

## 2021-09-09 DIAGNOSIS — O0990 Supervision of high risk pregnancy, unspecified, unspecified trimester: Secondary | ICD-10-CM

## 2021-09-09 DIAGNOSIS — Z3A16 16 weeks gestation of pregnancy: Secondary | ICD-10-CM

## 2021-09-09 DIAGNOSIS — O09522 Supervision of elderly multigravida, second trimester: Secondary | ICD-10-CM

## 2021-09-09 DIAGNOSIS — O26899 Other specified pregnancy related conditions, unspecified trimester: Secondary | ICD-10-CM

## 2021-09-09 DIAGNOSIS — O30042 Twin pregnancy, dichorionic/diamniotic, second trimester: Secondary | ICD-10-CM

## 2021-09-09 NOTE — Progress Notes (Unsigned)
    PRENATAL VISIT NOTE  Subjective:  [name] [name] is a 36 y.o. G2P1001 at 1w5dbeing seen today for ongoing prenatal care.  She is currently monitored for the following issues for this high-risk pregnancy and has Polysubstance abuse ([place]; Bipolar disorder, curr episode mixed, severe, with psychotic features ([place]; Cannabis use disorder, severe, dependence ([place]; AMA (advanced maternal age) multigravida 354+ Tobacco use affecting pregnancy, antepartum; Hip instability, left; Increased SMA carrier risk on Horizon testing; Labral tear of left hip joint; Dichorionic diamniotic twin gestation; Left ovarian cyst; and Supervision of high risk pregnancy, antepartum on their problem list.  Patient reports  vaginal/hip pain. Patient works as CTechnical brewer.  Contractions: Irritability. Vag. Bleeding: None.  Movement: Absent. Denies leaking of fluid.   The following portions of the patient's history were reviewed and updated as appropriate: allergies, current medications, past family history, past medical history, past social history, past surgical history and problem list.   Objective:   Vitals:   09/09/21 1619  BP: 111/61  Pulse: 85  Weight: 229 lb 4.8 oz (104 kg)    Fetal Status: Fetal Heart Rate (bpm): 132/134   Movement: Absent     General:  Alert, oriented and cooperative. Patient is in no acute distress.  Skin: Skin is warm and dry. No rash noted.   Cardiovascular: Normal heart rate noted  Respiratory: Normal respiratory effort, no problems with respiration noted  Abdomen: Soft, gravid, appropriate for gestational age.  Pain/Pressure: Present     Pelvic: Patient declines cervix check3        Extremities: Normal range of motion.  Edema: None  Mental Status: Normal mood and affect. Normal behavior. Normal judgment and thought content.   Assessment and Plan:  Pregnancy: G2P1001 at 152w5d. Pelvic pain in pregnancy Pt with h/o left hip labral tear. I told her I recommend PT and work  letter but she had similar issues with last pregnancy and this is a short interval pregnancy - Ambulatory referral to Physical Therapy  2. [redacted] weeks gestation of pregnancy Declines afp. Follow up anatomy u/s  3. Dichorionic diamniotic twin pregnancy in second trimester  4. Multigravida of advanced maternal age in second trimester Low risk panorama  5. Supervision of high risk pregnancy, antepartum Pt amenable to low dose asa  Preterm labor symptoms and general obstetric precautions including but not limited to vaginal bleeding, contractions, leaking of fluid and fetal movement were reviewed in detail with the patient. Please refer to After Visit Summary for other counseling recommendations.   Return in 16 days (on 09/25/2021) for in person, md visit, high risk ob. same day as u/s.  Future Appointments  Date Time Provider [place]7/26/2023 10:15 AM [facility]7/26/2023 10:30 AM [facility] US3 [facility]7/26/2023  2:35 PM DuRadene GunningMD WMK[facility] CampusM[facility]  ChAletha HalimMD

## 2021-09-09 NOTE — Telephone Encounter (Signed)
Almira called front desk and call transferred to nurse at patient request. She wants to be get a doctors note because she thinks she needs to be taken out of work. She states she is pregnant with twins , and is having pain in her " slip of her vagina bone" . She denies any vaginal bleeding. 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  States went to ER but couldn't be seen because she had her child with her and can't be seen without someone to care for child. States she is on verge of being fired. Patient teary. Support given. We discussed she would need to be evaluated by a doctor for doctor to determine if she needs to be taken out of work. I explained we do not usually take patients out of work at this early stage unless absolutely necessary. I explained I will talk wit registars and see if we can get her in asap instead of waiting until her next ob on 09/16/21. She voices understanding.  I talked with registar and we can get her in at 4:15 today. I called patient but could not reach her. Registrar will also call her. I will send a MyChart message also. LStaci Acosta

## 2021-09-10 DIAGNOSIS — O09892 Supervision of other high risk pregnancies, second trimester: Secondary | ICD-10-CM | POA: Insufficient documentation

## 2021-09-10 MED ORDER — ASPIRIN 81 MG PO TBEC: 81.0000 mg | DELAYED_RELEASE_TABLET | Freq: Every day | ORAL | 2 refills | Status: DC

## 2021-09-16 ENCOUNTER — Encounter: Payer: Medicaid Other | Admitting: Obstetrics & Gynecology

## 2021-09-21 ENCOUNTER — Encounter (HOSPITAL_COMMUNITY): Payer: Self-pay | Admitting: Emergency Medicine

## 2021-09-21 ENCOUNTER — Inpatient Hospital Stay (HOSPITAL_COMMUNITY): Payer: Medicaid Other

## 2021-09-21 ENCOUNTER — Emergency Department (HOSPITAL_COMMUNITY)
Admission: EM | Admit: 2021-09-21 | Discharge: 2021-09-21 | Disposition: A | Discharge status: Home / Self Care | Payer: Medicaid Other | Attending: Emergency Medicine | Admitting: Emergency Medicine

## 2021-09-21 ENCOUNTER — Inpatient Hospital Stay (EMERGENCY_DEPARTMENT_HOSPITAL)
Admission: AD | Admit: 2021-09-21 | Discharge: 2021-09-21 | Disposition: A | Discharge status: Home / Self Care | Payer: Medicaid Other | Source: Home / Self Care | Attending: Obstetrics and Gynecology | Admitting: Obstetrics and Gynecology

## 2021-09-21 ENCOUNTER — Inpatient Hospital Stay (HOSPITAL_BASED_OUTPATIENT_CLINIC_OR_DEPARTMENT_OTHER): Payer: Medicaid Other

## 2021-09-21 ENCOUNTER — Emergency Department (HOSPITAL_COMMUNITY): Payer: Medicaid Other

## 2021-09-21 DIAGNOSIS — O30042 Twin pregnancy, dichorionic/diamniotic, second trimester: Secondary | ICD-10-CM | POA: Insufficient documentation

## 2021-09-21 DIAGNOSIS — O26892 Other specified pregnancy related conditions, second trimester: Secondary | ICD-10-CM

## 2021-09-21 DIAGNOSIS — D72829 Elevated white blood cell count, unspecified: Secondary | ICD-10-CM | POA: Diagnosis not present

## 2021-09-21 DIAGNOSIS — N83202 Unspecified ovarian cyst, left side: Secondary | ICD-10-CM | POA: Insufficient documentation

## 2021-09-21 DIAGNOSIS — Z7982 Long term (current) use of aspirin: Secondary | ICD-10-CM | POA: Insufficient documentation

## 2021-09-21 DIAGNOSIS — M25552 Pain in left hip: Secondary | ICD-10-CM | POA: Insufficient documentation

## 2021-09-21 DIAGNOSIS — R739 Hyperglycemia, unspecified: Secondary | ICD-10-CM | POA: Insufficient documentation

## 2021-09-21 DIAGNOSIS — O99322 Drug use complicating pregnancy, second trimester: Secondary | ICD-10-CM | POA: Insufficient documentation

## 2021-09-21 DIAGNOSIS — R1084 Generalized abdominal pain: Secondary | ICD-10-CM | POA: Insufficient documentation

## 2021-09-21 DIAGNOSIS — O09522 Supervision of elderly multigravida, second trimester: Secondary | ICD-10-CM | POA: Insufficient documentation

## 2021-09-21 DIAGNOSIS — O210 Mild hyperemesis gravidarum: Secondary | ICD-10-CM | POA: Insufficient documentation

## 2021-09-21 DIAGNOSIS — R102 Pelvic and perineal pain: Secondary | ICD-10-CM | POA: Diagnosis not present

## 2021-09-21 DIAGNOSIS — O99282 Endocrine, nutritional and metabolic diseases complicating pregnancy, second trimester: Secondary | ICD-10-CM | POA: Insufficient documentation

## 2021-09-21 DIAGNOSIS — O3482 Maternal care for other abnormalities of pelvic organs, second trimester: Secondary | ICD-10-CM | POA: Insufficient documentation

## 2021-09-21 DIAGNOSIS — M79605 Pain in left leg: Secondary | ICD-10-CM | POA: Insufficient documentation

## 2021-09-21 DIAGNOSIS — R109 Unspecified abdominal pain: Secondary | ICD-10-CM

## 2021-09-21 DIAGNOSIS — R112 Nausea with vomiting, unspecified: Secondary | ICD-10-CM | POA: Diagnosis not present

## 2021-09-21 DIAGNOSIS — R1032 Left lower quadrant pain: Secondary | ICD-10-CM | POA: Insufficient documentation

## 2021-09-21 DIAGNOSIS — O99810 Abnormal glucose complicating pregnancy: Secondary | ICD-10-CM | POA: Diagnosis not present

## 2021-09-21 DIAGNOSIS — Z3A18 18 weeks gestation of pregnancy: Secondary | ICD-10-CM | POA: Insufficient documentation

## 2021-09-21 DIAGNOSIS — F1290 Cannabis use, unspecified, uncomplicated: Secondary | ICD-10-CM | POA: Insufficient documentation

## 2021-09-21 DIAGNOSIS — E876 Hypokalemia: Secondary | ICD-10-CM | POA: Diagnosis not present

## 2021-09-21 DIAGNOSIS — O219 Vomiting of pregnancy, unspecified: Secondary | ICD-10-CM | POA: Insufficient documentation

## 2021-09-21 LAB — RAPID URINE DRUG SCREEN, HOSP PERFORMED
Amphetamines: NOT DETECTED
Barbiturates: NOT DETECTED
Benzodiazepines: NOT DETECTED
Cocaine: NOT DETECTED
Opiates: POSITIVE — AB
Tetrahydrocannabinol: POSITIVE — AB

## 2021-09-21 LAB — URINALYSIS, ROUTINE W REFLEX MICROSCOPIC
Bilirubin Urine: NEGATIVE
Bilirubin Urine: NEGATIVE
Glucose, UA: 150 mg/dL — AB
Glucose, UA: 50 mg/dL — AB
Hgb urine dipstick: NEGATIVE
Hgb urine dipstick: NEGATIVE
Ketones, ur: 20 mg/dL — AB
Ketones, ur: 80 mg/dL — AB
Leukocytes,Ua: NEGATIVE
Leukocytes,Ua: NEGATIVE
Nitrite: NEGATIVE
Nitrite: NEGATIVE
Protein, ur: NEGATIVE mg/dL
Protein, ur: NEGATIVE mg/dL
Specific Gravity, Urine: 1.013 (ref 1.005–1.030)
Specific Gravity, Urine: 1.018 (ref 1.005–1.030)
pH: 6 (ref 5.0–8.0)
pH: 6 (ref 5.0–8.0)

## 2021-09-21 LAB — COMPREHENSIVE METABOLIC PANEL
ALT: 11 U/L (ref 0–44)
ALT: 12 U/L (ref 0–44)
AST: 18 U/L (ref 15–41)
AST: 15 U/L (ref 15–41)
Albumin: 3.2 g/dL — ABNORMAL LOW (ref 3.5–5.0)
Albumin: 3.3 g/dL — ABNORMAL LOW (ref 3.5–5.0)
Alkaline Phosphatase: 55 U/L (ref 38–126)
Alkaline Phosphatase: 53 U/L (ref 38–126)
Anion gap: 7 (ref 5–15)
Anion gap: 8 (ref 5–15)
BUN: 6 mg/dL (ref 6–20)
BUN: <5 mg/dL — ABNORMAL LOW (ref 6–20)
CO2: 22 mmol/L (ref 22–32)
CO2: 22 mmol/L (ref 22–32)
Calcium: 9 mg/dL (ref 8.9–10.3)
Calcium: 9 mg/dL (ref 8.9–10.3)
Chloride: 104 mmol/L (ref 98–111)
Chloride: 105 mmol/L (ref 98–111)
Creatinine, Ser: 0.64 mg/dL (ref 0.44–1.00)
Creatinine, Ser: 0.46 mg/dL (ref 0.44–1.00)
GFR, Estimated: >60 mL/min (ref >60)
GFR, Estimated: >60 mL/min (ref >60)
Glucose, Bld: 103 mg/dL — ABNORMAL HIGH (ref 70–99)
Glucose, Bld: 113 mg/dL — ABNORMAL HIGH (ref 70–99)
Potassium: 3.7 mmol/L (ref 3.5–5.1)
Potassium: 3.4 mmol/L — ABNORMAL LOW (ref 3.5–5.1)
Sodium: 133 mmol/L — ABNORMAL LOW (ref 135–145)
Sodium: 135 mmol/L (ref 135–145)
Total Bilirubin: 0.4 mg/dL (ref 0.3–1.2)
Total Bilirubin: 0.5 mg/dL (ref 0.3–1.2)
Total Protein: 6.5 g/dL (ref 6.5–8.1)
Total Protein: 6.9 g/dL (ref 6.5–8.1)

## 2021-09-21 LAB — CBC WITH DIFFERENTIAL/PLATELET
Abs Immature Granulocytes: 0.08 10*3/uL — ABNORMAL HIGH (ref 0.00–0.07)
Basophils Absolute: 0 10*3/uL (ref 0.0–0.1)
Basophils Relative: 0 %
Eosinophils Absolute: 0.2 10*3/uL (ref 0.0–0.5)
Eosinophils Relative: 1 %
HCT: 34 % — ABNORMAL LOW (ref 36.0–46.0)
Hemoglobin: 11.8 g/dL — ABNORMAL LOW (ref 12.0–15.0)
Immature Granulocytes: 1 %
Lymphocytes Relative: 18 %
Lymphs Abs: 2.6 10*3/uL (ref 0.7–4.0)
MCH: 32.6 pg (ref 26.0–34.0)
MCHC: 34.7 g/dL (ref 30.0–36.0)
MCV: 93.9 fL (ref 80.0–100.0)
Monocytes Absolute: 1 10*3/uL (ref 0.1–1.0)
Monocytes Relative: 7 %
Neutro Abs: 10.6 10*3/uL — ABNORMAL HIGH (ref 1.7–7.7)
Neutrophils Relative %: 73 %
Platelets: 249 10*3/uL (ref 150–400)
RBC: 3.62 MIL/uL — ABNORMAL LOW (ref 3.87–5.11)
RDW: 12.5 % (ref 11.5–15.5)
WBC: 14.4 10*3/uL — ABNORMAL HIGH (ref 4.0–10.5)
nRBC: 0 % (ref 0.0–0.2)

## 2021-09-21 LAB — CBC
HCT: 34.6 % — ABNORMAL LOW (ref 36.0–46.0)
Hemoglobin: 12 g/dL (ref 12.0–15.0)
MCH: 32.3 pg (ref 26.0–34.0)
MCHC: 34.7 g/dL (ref 30.0–36.0)
MCV: 93.3 fL (ref 80.0–100.0)
Platelets: 261 10*3/uL (ref 150–400)
RBC: 3.71 MIL/uL — ABNORMAL LOW (ref 3.87–5.11)
RDW: 12.4 % (ref 11.5–15.5)
WBC: 18.2 10*3/uL — ABNORMAL HIGH (ref 4.0–10.5)
nRBC: 0 % (ref 0.0–0.2)

## 2021-09-21 MED ORDER — DIPHENHYDRAMINE HCL 50 MG/ML IJ SOLN
50.0000 mg | Freq: Once | INTRAMUSCULAR | Status: AC
Start: 2021-09-21 — End: 2021-09-21
  Administered 2021-09-21: 50 mg via INTRAVENOUS

## 2021-09-21 MED ORDER — HYDROMORPHONE HCL 1 MG/ML IJ SOLN
1.0000 mg | Freq: Once | INTRAMUSCULAR | Status: AC
  Administered 2021-09-21: 1 mg via INTRAVENOUS

## 2021-09-21 MED ORDER — ONDANSETRON HCL 4 MG/2ML IJ SOLN
4.0000 mg | Freq: Once | INTRAMUSCULAR | Status: AC
  Administered 2021-09-21: 4 mg via INTRAVENOUS
  Filled 2021-09-21: qty 2

## 2021-09-21 MED ORDER — HYDROMORPHONE HCL 1 MG/ML IJ SOLN
1.0000 mg | Freq: Once | INTRAMUSCULAR | Status: AC
  Administered 2021-09-21: 1 mg via INTRAVENOUS
  Filled 2021-09-21: qty 1

## 2021-09-21 MED ORDER — HYDROMORPHONE HCL 1 MG/ML IJ SOLN
1.0000 mg | Freq: Once | INTRAMUSCULAR | Status: AC
  Administered 2021-09-21: 1 mg via INTRAMUSCULAR
  Filled 2021-09-21: qty 1

## 2021-09-21 MED ORDER — DIPHENHYDRAMINE HCL 50 MG/ML IJ SOLN
INTRAMUSCULAR | Status: AC
  Filled 2021-09-21: qty 1

## 2021-09-21 MED ORDER — IBUPROFEN 600 MG PO TABS: 600.0000 mg | ORAL_TABLET | Freq: Once | ORAL | Status: DC

## 2021-09-21 MED ORDER — SODIUM CHLORIDE 0.9 % IV SOLN
12.5000 mg | Freq: Once | INTRAVENOUS | Status: DC
  Filled 2021-09-21: qty 0.5

## 2021-09-21 MED ORDER — HYDROMORPHONE HCL 1 MG/ML IJ SOLN
0.5000 mg | Freq: Once | INTRAMUSCULAR | Status: AC
  Administered 2021-09-21: 0.5 mg via INTRAVENOUS
  Filled 2021-09-21: qty 1

## 2021-09-21 MED ORDER — IOHEXOL 300 MG/ML  SOLN
100.0000 mL | Freq: Once | INTRAMUSCULAR | Status: AC | PRN
  Administered 2021-09-21: 100 mL via INTRAVENOUS

## 2021-09-21 MED ORDER — LACTATED RINGERS IV BOLUS
1000.0000 mL | Freq: Once | INTRAVENOUS | Status: AC
  Administered 2021-09-21: 1000 mL via INTRAVENOUS

## 2021-09-21 MED ORDER — HALOPERIDOL LACTATE 5 MG/ML IJ SOLN
5.0000 mg | Freq: Once | INTRAMUSCULAR | Status: AC
  Administered 2021-09-21: 5 mg via INTRAVENOUS
  Filled 2021-09-21: qty 1

## 2021-09-21 MED ORDER — LACTATED RINGERS IV SOLN
Freq: Once | INTRAVENOUS | Status: AC
Start: 2021-09-21 — End: 2021-09-21

## 2021-09-21 MED ORDER — SODIUM CHLORIDE (PF) 0.9 % IJ SOLN
INTRAMUSCULAR | Status: AC
  Filled 2021-09-21: qty 50

## 2021-09-21 MED ORDER — ONDANSETRON HCL 8 MG PO TABS: 8.0000 mg | ORAL_TABLET | Freq: Three times a day (TID) | ORAL | 0 refills | Status: DC | PRN

## 2021-09-21 MED ORDER — HYDROMORPHONE HCL 1 MG/ML IJ SOLN
1.0000 mg | Freq: Once | INTRAMUSCULAR | Status: DC
  Filled 2021-09-21: qty 1

## 2021-09-21 NOTE — MAU Provider Note (Cosign Needed Addendum)
Chief Complaint:  Abdominal Pain   None    HPI: [name] [name] is a 36 y.o. G2P1001 at 59w3dho presents to maternity admissions reporting severe LLQ pain which radiates down left leg.  Patient is out of control, writhing.  Has a history of polysubstance use, left hip pain, and a large left ovarian cyst (6cm in May).  Has a twin gestation. Also has been vomiting "all day today". She reports good fetal movement, denies LOF, vaginal bleeding, urinary symptoms, h/a, dizziness, diarrhea, constipation or fever/chills.    Abdominal Pain This is a new problem. The current episode started today. The problem occurs constantly. The problem has been unchanged. The pain is located in the LLQ. The pain is severe. The quality of the pain is sharp and cramping. Pain radiation: left leg. Associated symptoms include nausea and vomiting. Pertinent negatives include no constipation, diarrhea, dysuria, fever, frequency or myalgias. The pain is relieved by Nothing. She has tried nothing for the symptoms.   Past Medical History: Past Medical History:  Diagnosis Date   Anxiety    Bipolar disorder (HFreer    Boils    Headache(784.0)    PCOS (polycystic ovarian syndrome) 02/26/2012   Recurrent UTI 05/15/2013   Neg 03/2020  E. Coli   Smoker 01/18/2019    Past obstetric history: OB History  Gravida Para Term Preterm AB Living  '2 1 1 '$ 0 0 1  SAB IAB Ectopic Multiple Live Births  0 0 0 0 1    # Outcome Date GA Lbr Len/2nd Weight Sex Delivery Anes PTL Lv  2 Current           1 Term 10/07/20 339w2d  Vag-Vacuum   LIV    Past Surgical History: Past Surgical History:  Procedure Laterality Date   LAPAROSCOPIC UNILATERAL SALPINGECTOMY Left    paratubal cystectomy    Family History: Family History  Problem Relation Age of Onset   Mental illness Mother    Thyroid disease Mother    Alcohol abuse Brother    Breast cancer Paternal Aunt     Social History: Social History   Tobacco Use   Smoking  status: Every Day    Packs/day: 0.25    Types: Cigarettes   Smokeless tobacco: Never  Vaping Use   Vaping Use: Former   Substances: THC  Substance Use Topics   Alcohol use: Not Currently    Comment: rare   Drug use: Yes    Types: Marijuana    Comment: smokes marijuana twice a week and no cocaine for almost a yearLAST  USE OF COCAINE MARCH,  LAST  MARIJUANA-  YESTERDAY     Allergies:  Allergies  Allergen Reactions   Ibuprofen Nausea And Vomiting    Stomach upset    Meds:  Medications Prior to Admission  Medication Sig Dispense Refill Last Dose   aspirin EC 81 MG tablet Take 1 tablet (81 mg total) by mouth daily. 60 tablet 2    nicotine polacrilex (NICOTINE MINI) 2 MG lozenge Take 1 lozenge (2 mg total) by mouth as needed for smoking cessation. 100 tablet 0    Prenatal Vit-Fe Fumarate-FA (MULTIVITAMIN-PRENATAL) 27-0.8 MG TABS tablet Take 1 tablet by mouth daily at 12 noon.       I have reviewed patient's Past Medical Hx, Surgical Hx, Family Hx, Social Hx, medications and allergies.   ROS:  Review of Systems  Constitutional:  Negative for fever.  Gastrointestinal:  Positive for abdominal pain, nausea and  vomiting. Negative for constipation and diarrhea.  Genitourinary:  Negative for dysuria and frequency.  Musculoskeletal:  Negative for myalgias.   Other systems negative  Physical Exam  Patient Vitals for the past 24 hrs:  BP Temp Temp src Pulse Resp SpO2 Height  09/21/21 0111 109/71 97.9 F (36.6 C) Oral 85 (!) 26 98 % '5\' 11"'$  (1.803 m)   Constitutional: Well-developed, well-nourished female in no acute distress.  Cardiovascular: normal rate and rhythm Respiratory: normal effort, clear to auscultation bilaterally GI: Abd soft, non-tender, gravid appropriate for gestational age.   No rebound or guarding. MS: Extremities nontender, no edema, normal ROM Neurologic: Alert and oriented x 4.  GU: Neg CVAT.  PELVIC EXAM: Cervix pink, visually closed, without lesion, scant  white creamy discharge, vaginal walls and external genitalia normal Cervix closed internal os,`1cm external os and 40%effaced  FHT:  160s both twin   Labs: UA ordered A/Positive/-- (05/24 1122)  Imaging:  Verbal report:  L ovarian cyst is now down to 3cm (from 6cm) Bedside US by me to check FHTs:  twins active, FHR 160s both twins Renal US and cervical length ordered  MAU Course/MDM: I have reviewed the triage vital signs and the nursing notes.   Pertinent labs & imaging results that were available during my care of the patient were reviewed by me and considered in my medical decision making (see chart for details).      I have reviewed her medical records including past results, notes and treatments.   I have ordered labs and reviewed results. UA and pelvic US to evaluate L Ovarian cyst  Treatments in MAU included Dilaudid x 1 dose for pain to facilitate exam.  Zofran for nausea.  IV fluids for nausea and vomiting. .  Phenergan and Haldol ordered per consult Dr Ilda Basset He recommends Renal US and cervical length.   Assessment: Twin IUP at 28w3dLLQ pain radiating to left leg Hx Left hip Labrum tear New onset vomiting today  Plan:  MHansel FeinsteinCNM, MSN Certified Nurse-Midwife 09/21/2021 2:40 AM      Patient given dilaudid, haldol, & benadryl. Reports complete resolution of symptoms & requesting to be discharged home. Renal ultrasound normal & u/a negative. OB limited ultrasound performed - tech unable to visualize cervical length but patient refused transvaginal ultrasound.  Discussed with patient that without cervical length we can't exclude cervical insufficiency. Patient states she currently feels much better & continues to decline TVUS. Cervix closed per previous provider's exam. Will discharge patient per her request.   UDS positive for opiates & marijuana. Urine was collected after patient received IM dilaudid.     A/P 1. Cannabinoid hyperemesis syndrome   2.  Abdominal pain during pregnancy in second trimester   3. Dichorionic diamniotic twin pregnancy in second trimester   4. [redacted] weeks gestation of pregnancy    -Discontinue marijuana -Reviewed reasons to return to MAU  LJorje Guild NP

## 2021-09-21 NOTE — ED Provider Notes (Signed)
[facility] DEPT Provider Note   CSN: [id number] Arrival date & time: 09/21/21  1239     History  Chief Complaint  Patient presents with   Abdominal Pain    [name] [name] is a 36 y.o. female.  HPI 36 year old female G2, P1 with current pregnancy reported to be at 18 weeks 4 days with twin gestation.  Patient reports that she was feeling all right until last night at approximately 1145 when she started having sharp pain in the left flank radiating into the left groin and down the left leg.  Pain is worse with laying on her left side.  It is relieved by getting up and moving around some.  She has had associated nausea and vomiting.  She reports that she had poor appetite for 2 days prior to this.  She denies any UTI symptoms including frequency of urination, burning with urination, or blood in urine.  She was seen and evaluated at [facility] today.  There she had an ultrasound that did not show any acute abnormalities although a stick structure was seen in the left ovary measuring 2.4 x 1.6 cm suggesting a dominant follicle that had no evidence of left ovarian cyst, but of concern for torsion Ultrasound was also obtained of renal with no report of abnormality Patient reports no UTI symptoms labs earlier today showed a [name] count of 14,400 Mild anemia with hemoglobin 11.8 Mild hyponatremia with sodium of 133 Glucose of 103 Urine drug screen positive for opiates which she was given in the ED and [facility] which she was not     Home Medications Prior to Admission medications   Medication Sig Start Date End Date Taking? Authorizing Provider  ondansetron (ZOFRAN) 8 MG tablet Take 1 tablet (8 mg total) by mouth every 8 (eight) hours as needed for nausea or vomiting. 09/21/21  Yes Pattricia Boss, MD  aspirin EC 81 MG tablet Take 1 tablet (81 mg total) by mouth daily. 09/10/21   Aletha Halim, MD  nicotine polacrilex (NICOTINE MINI) 2 MG lozenge Take  1 lozenge (2 mg total) by mouth as needed for smoking cessation. 08/13/21   Clarnce Flock, MD  Prenatal Vit-Fe Fumarate-FA (MULTIVITAMIN-PRENATAL) 27-0.8 MG TABS tablet Take 1 tablet by mouth daily at 12 noon.    [provider]  benztropine (COGENTIN) 0.5 MG tablet Take 1 tablet (0.5 mg total) by mouth at bedtime. Patient taking differently: Take 0.5 mg by mouth daily.  08/04/14 06/27/19  Niel Hummer, NP  lamoTRIgine (LAMICTAL) 25 MG tablet Take 1 tablet (25 mg total) by mouth daily. For mood stability 08/04/14 06/27/19  Niel Hummer, NP  promethazine (PHENERGAN) 25 MG tablet Take 1 tablet (25 mg total) by mouth every 6 (six) hours as needed for nausea or vomiting. 04/25/16 06/27/19  Woodroe Mode, MD      Allergies    Ibuprofen    Review of Systems   Review of Systems  Physical Exam Updated Vital Signs BP 125/73   Pulse (!) 58   Temp (!) 97.4 F (36.3 C) (Oral)   Resp 16   LMP 05/15/2021 Comment: Doctor and patient signed consent form  SpO2 97%  Physical Exam Vitals reviewed.  Constitutional:      General: She is in acute distress.     Appearance: She is well-developed.  HENT:     Head: Normocephalic.  Cardiovascular:     Rate and Rhythm: Normal rate and regular rhythm.  Abdominal:  Tenderness: There is abdominal tenderness.     Comments: Gravid Tenderness noted to left side of lower abdomen diffusely  Genitourinary:    Vagina: Normal.     Comments: External exam vaginal exam reveals no evidence of acute abnormality do not bimanual performed with mild tenderness Unable to definitively palpate cervix Neurological:     Mental Status: She is alert.     ED Results / Procedures / Treatments   Labs (all labs ordered are listed, but only abnormal results are displayed) Labs Reviewed  URINALYSIS, ROUTINE W REFLEX MICROSCOPIC - Abnormal; Notable for the following components:      Result Value   Glucose, UA 50 (*)    Ketones, ur 80 (*)    All other  components within normal limits  CBC - Abnormal; Notable for the following components:   WBC 18.2 (*)    RBC 3.71 (*)    HCT 34.6 (*)    All other components within normal limits  COMPREHENSIVE METABOLIC PANEL - Abnormal; Notable for the following components:   Potassium 3.4 (*)    Glucose, Bld 113 (*)    BUN <5 (*)    Albumin 3.3 (*)    All other components within normal limits    EKG None  Radiology CT ABDOMEN PELVIS W CONTRAST  Result Date: 09/21/2021 CLINICAL DATA:  Abdominal pain, acute, nonlocalized. Patient is [redacted] weeks pregnant-she had [place] of pelvis and renal US earlier today without acute abnormalities Pregnancy Informed consent signed by patient and Dr. Jeanell Sparrow Abdominal pain EXAM: CT ABDOMEN AND PELVIS WITH CONTRAST TECHNIQUE: Multidetector CT imaging of the abdomen and pelvis was performed using the standard protocol following bolus administration of intravenous contrast. RADIATION DOSE REDUCTION: This exam was performed according to the departmental dose-optimization program which includes automated exposure control, adjustment of the mA and/or kV according to patient size and/or use of iterative reconstruction technique. CONTRAST:  144m OMNIPAQUE IOHEXOL 300 MG/ML  SOLN COMPARISON:  None Available. FINDINGS: Lower chest: No acute abnormality.  Query tiny hiatal hernia. Hepatobiliary: No focal liver abnormality. No gallstones, gallbladder wall thickening, or pericholecystic fluid. No biliary dilatation. Pancreas: No focal lesion. Normal pancreatic contour. No surrounding inflammatory changes. No main pancreatic ductal dilatation. Spleen: Normal in size without focal abnormality. Adrenals/Urinary Tract: No adrenal nodule bilaterally. Bilateral kidneys enhance symmetrically. No hydronephrosis. No hydroureter. The urinary bladder is unremarkable. Stomach/Bowel: Stomach is within normal limits. No evidence of bowel wall thickening or dilatation. Appendix appears normal. Vascular/Lymphatic: No  abdominal aorta or iliac aneurysm. No abdominal, pelvic, or inguinal lymphadenopathy. Reproductive: Gravid uterus with twin pregnancy. Otherwise uterus and bilateral adnexa are unremarkable. Other: No intraperitoneal free fluid. No intraperitoneal free gas. No organized fluid collection. Musculoskeletal: Small fat containing umbilical hernia. No suspicious lytic or blastic osseous lesions. No acute displaced fracture. L5-S1 intervertebral disc space narrowing and vacuum phenomenon. IMPRESSION: 1. Query tiny hiatal hernia. Otherwise no acute intra-abdominal or intrapelvic abnormality. 2. Gravid uterus with twin pregnancy. Please see separately dictated ultrasound Ob 09/21/2021. Electronically Signed   By: MIven FinnM.D.   On: 09/21/2021 16:05   [place]FM OB Limited  Result Date: 09/21/2021 ----------------------------------------------------------------------  OBSTETRICS REPORT                        (Signed Final 09/21/2021 11:32 am) ---------------------------------------------------------------------- Patient Info  ID #:       [id number]  D.O.B.:  07-Oct-1985 (36 yrs)  Name:       [name] [name]               Visit Date: 09/21/2021 04:54 am              [name] [name] ---------------------------------------------------------------------- Performed By  Attending:        Johnell Comings MD         Ref. Address:      [address]                                                              [place], [place]  Performed By:     Berlinda Last          Secondary Phy.:    [facility] MAU/Triage                    RDMS  Referred By:      Wilkie Aye                [facility]                    [place] ---------------------------------------------------------------------- Orders  #  Description                           Code        Ordered By  1  [place] MFM OB LIMITED                      [phone]    Hansel Feinstein ----------------------------------------------------------------------  #  Order #                     Accession #                Episode #  1  [id number]                   [id number]                 [id number] ---------------------------------------------------------------------- Indications  Twin pregnancy, di/di, second trimester         O30.042  Pelvic pain affecting pregnancy in second       O26.892  trimester (LLQ)  [redacted] weeks gestation of pregnancy                 Z3A.18 ---------------------------------------------------------------------- Fetal Evaluation (Fetus A)  Num Of Fetuses:          2  Fetal Heart Rate(bpm):   130  Cardiac Activity:        Observed  Fetal Lie:  Maternal right side  Presentation:            Cephalic  Placenta:                Anterior  P. Cord Insertion:       Visualized  Membrane Desc:      Dividing Membrane seen - Dichorionic.  Amniotic Fluid  AFI FV:      Within normal limits                              Largest Pocket(cm)                              5.36  Comment:    No placental abruption or previa identified. ---------------------------------------------------------------------- OB History  Gravidity:    2         Term:   1        Prem:   0        SAB:   0  TOP:          0       Ectopic:  0        Living: 1 ---------------------------------------------------------------------- Gestational Age (Fetus A)  LMP:           18w 3d        Date:  05/15/21                  EDD:   02/19/22  Best:          Renae Fickle 3d     Det. By:  LMP  (05/15/21)          EDD:   02/19/22 ---------------------------------------------------------------------- Anatomy (Fetus A)  Cranium:               Appears normal         Stomach:                Appears normal, left                                                                        sided  Choroid Plexus:        Appears normal         Kidneys:                Appear normal  Diaphragm:             Appears normal          Bladder:                Appears normal ---------------------------------------------------------------------- Fetal Evaluation (Fetus B)  Num Of Fetuses:          2  Fetal Heart Rate(bpm):   135  Cardiac Activity:        Observed  Fetal Lie:               Upper left Fetus  Presentation:            Transverse, head to maternal left  Placenta:                Posterior  P. Cord Insertion:       Visualized  Membrane Desc:      Dividing Membrane seen - Dichorionic.  Amniotic Fluid  AFI FV:      Within normal limits                              Largest Pocket(cm)                              5  Comment:    No placental abruption or previa identified. ---------------------------------------------------------------------- Gestational Age (Fetus B)  LMP:           18w 3d        Date:  05/15/21                  EDD:   02/19/22  Best:          Renae Fickle 3d     Det. By:  LMP  (05/15/21)          EDD:   02/19/22 ---------------------------------------------------------------------- Anatomy (Fetus B)  Cranium:               Appears normal         Stomach:                Appears normal, left                                                                        sided  Ventricles:            Appears normal         Kidneys:                Appear normal  Diaphragm:             Appears normal         Bladder:                Appears normal ---------------------------------------------------------------------- Cervix Uterus Adnexa  Cervix  Not adaquately visualized  Adnexa  No abnormality visualized. ---------------------------------------------------------------------- Comments  This patient presented to the MAU due to abdominal pain.  She has a history of drug abuse.  A dichorionic, diamniotic twin gestation was noted on today's  exam.  There was normal amniotic fluid noted around both fetuses.  The patient declined to have a transvaginal ultrasound  performed to assess the cervical length.  She already has a fetal anatomy scan scheduled in  the MFM  office on September 25, 2021. ----------------------------------------------------------------------                   Johnell Comings, MD Electronically Signed Final Report   09/21/2021 11:32 am ----------------------------------------------------------------------  US RENAL  Result Date: 09/21/2021 CLINICAL DATA:  Left lower quadrant pain EXAM: RENAL / URINARY TRACT ULTRASOUND COMPLETE COMPARISON:  05/12/2013 FINDINGS: Right Kidney: Renal measurements: 13 x 6 x 5.6 cm = volume: 223 mL. Echogenicity within normal limits. No mass or hydronephrosis visualized. Left Kidney: Renal measurements: 12.8 x 6.2 x 5.7 cm = volume: 234 mL. Echogenicity within normal limits. No mass or hydronephrosis visualized. Bladder: Empty and not  assessed IMPRESSION: Normal ultrasound of the kidneys. Electronically Signed   By: Jorje Guild M.D.   On: 09/21/2021 05:02   US Pelvis Limited  Result Date: 09/21/2021 CLINICAL DATA:  Severe left lower quadrant pain. History of left ovarian cyst. Twin pregnancy. EXAM: LIMITED ULTRASOUND OF PELVIS TECHNIQUE: Limited transabdominal ultrasound examination of the pelvis was performed for evaluation of the ovaries. COMPARISON:  07/09/2021. FINDINGS: The right ovary measures 2.5 x 1.4 x 1.7 cm, total volume 3.1 mL. The left ovary measures 3.0 x 2.6 x 2.4 cm, total volume 9.6 cm. A cystic structure is present in the left ovary measuring 2.4 x 1.6 cm suggesting dominant follicle. IMPRESSION: No evidence of left ovarian cyst. A dominant follicle is noted on the left. Electronically Signed   By: Brett Fairy M.D.   On: 09/21/2021 02:45    Procedures Procedures    Medications Ordered in ED Medications  lactated ringers bolus 1,000 mL (0 mLs Intravenous Stopped 09/21/21 1625)  HYDROmorphone (DILAUDID) injection 1 mg (1 mg Intravenous Given 09/21/21 1427)  sodium chloride (PF) 0.9 % injection (  Given by Other 09/21/21 1609)  iohexol (OMNIPAQUE) 300 MG/ML solution 100 mL (100 mLs  Intravenous Contrast Given 09/21/21 1544)  HYDROmorphone (DILAUDID) injection 0.5 mg (0.5 mg Intravenous Given 09/21/21 1625)    ED Course/ Medical Decision Making/ A&P Clinical Course as of 09/21/21 1711  Sat Sep 21, 2021  1613 Urinalysis reviewed interpreted and normal with the exception of 80 ketones CBC reviewed interpreted significant for leukocytosis 18,200 otherwise hemoglobin and platelets are within normal limits Complete metabolic panel reviewed interpreted significant for mild hypokalemia at 3.4 and mild hyperglycemia at 113 BUN less than 5 was within normal limits [DR]    Clinical Course User Index [DR] Pattricia Boss, MD                           Medical Decision Making 36 year old female [redacted] weeks gestation with twins presents today with left sided abdominal pain radiating into the left lower quadrant.  She was seen earlier and evaluated at [facility].  At that time she had ultrasound done of her pelvis and her renal system without acute abnormalities noted.  Patient states she was given some pain medication and went home and pain started again and she describes it as severe.  Discharge from the MAU diagnosis was cannabinoid hyperemesis syndrome. Patient examined here and significant tenderness in the left flank to left lower quadrant Given earlier ultrasounds, decision made to proceed to CT scanning Discussed risks and benefits with the patient and she signed consent form. CT scan without any evidence of acute abnormality to explain pain Labs are significant for leukocytosis of 18,200 which is elevated from earlier today Urinalysis significant for ketones Patient has had vomiting that is likely secondary to her ongoing vomiting. Here she is treated with pain medicine antiemetics Pelvic exam is nonrevealing Differential diagnosis includes but is not limited to preterm labor, patient evaluated earlier at women's and pain does not appear to be consistent with contractions,  patient is at 18 weeks and is currently nonpliable, pelvic infection, patient had her sound and pelvic informative women's.  Here she does not appear to have any abnormal discharge, urinary tract infection urinalysis obtained does not show evidence of acute UTI, kidney stone, patient had ultrasound earlier did not show any evidence of obstruction, patient without any hematuria, other causes of lower abdominal pain including appendicitis, diverticulitis, other abdominal  abscesses, or evaluated here with CT scan.  No evidence of acute abnormalities noted on CT scan patient feels better after IV fluids and pain medicine.  She is able to take p.o.  I discussed that she should follow-up at MAU if any return of her symptoms.  We have discussed marijuana cessation.  Plan prescription for Zofran.  Discussed oral hydration.   Amount and/or Complexity of Data Reviewed External Data Reviewed: notes. Labs: ordered. Decision-making details documented in ED Course. Radiology: ordered and independent interpretation performed. Decision-making details documented in ED Course.  Risk Prescription drug management.           Final Clinical Impression(s) / ED Diagnoses Final diagnoses:  Abdominal pain, unspecified abdominal location  [redacted] weeks gestation of pregnancy  Nausea and vomiting, unspecified vomiting type    Rx / DC Orders ED Discharge Orders          Ordered    ondansetron (ZOFRAN) 8 MG tablet  Every 8 hours PRN        09/21/21 1710              Pattricia Boss, MD 09/21/21 1711

## 2021-09-21 NOTE — ED Notes (Signed)
Pt ambulated to restroom. Pt moaning loudly and complaining of being freezing.

## 2021-09-21 NOTE — Discharge Instructions (Signed)
Return to care  If you have heavier bleeding that soaks through more than 2 pads per hour for an hour or more If you bleed so much that you feel like you might pass out or you do pass out If you have significant abdominal pain that is not improved with Tylenol   

## 2021-09-21 NOTE — MAU Note (Signed)
Pt has arrived - hollering in lobby  - broiught to Rm 30- via W/C Says has left lower abd pain - started 2 hrs ago

## 2021-09-21 NOTE — Discharge Instructions (Signed)
Please use Zofran for nausea Use Gatorade mixed with water for oral rehydration Drink plenty of fluids Please return to the maternity admissions unit at [facility] if you have ongoing pain. Follow-up with your obstetrician as scheduled

## 2021-09-21 NOTE — ED Triage Notes (Signed)
Patient c/o continued abdominal pain since last night. States seen at MAU last night with workup at that time. Reports scheduled OB appt Wednesday.

## 2021-09-22 ENCOUNTER — Inpatient Hospital Stay (HOSPITAL_COMMUNITY)
Admission: AD | Admit: 2021-09-22 | Discharge: 2021-09-22 | Disposition: A | Discharge status: Home / Self Care | Payer: Medicaid Other | Attending: Obstetrics & Gynecology | Admitting: Obstetrics & Gynecology

## 2021-09-22 ENCOUNTER — Encounter (HOSPITAL_COMMUNITY): Payer: Self-pay | Admitting: Obstetrics & Gynecology

## 2021-09-22 DIAGNOSIS — R1032 Left lower quadrant pain: Secondary | ICD-10-CM | POA: Insufficient documentation

## 2021-09-22 DIAGNOSIS — O30042 Twin pregnancy, dichorionic/diamniotic, second trimester: Secondary | ICD-10-CM | POA: Insufficient documentation

## 2021-09-22 DIAGNOSIS — K5900 Constipation, unspecified: Secondary | ICD-10-CM | POA: Diagnosis not present

## 2021-09-22 DIAGNOSIS — O26892 Other specified pregnancy related conditions, second trimester: Secondary | ICD-10-CM | POA: Insufficient documentation

## 2021-09-22 DIAGNOSIS — K429 Umbilical hernia without obstruction or gangrene: Secondary | ICD-10-CM | POA: Diagnosis not present

## 2021-09-22 DIAGNOSIS — R112 Nausea with vomiting, unspecified: Secondary | ICD-10-CM | POA: Diagnosis not present

## 2021-09-22 DIAGNOSIS — O99342 Other mental disorders complicating pregnancy, second trimester: Secondary | ICD-10-CM | POA: Diagnosis not present

## 2021-09-22 DIAGNOSIS — O26899 Other specified pregnancy related conditions, unspecified trimester: Secondary | ICD-10-CM | POA: Diagnosis not present

## 2021-09-22 DIAGNOSIS — Z79899 Other long term (current) drug therapy: Secondary | ICD-10-CM | POA: Insufficient documentation

## 2021-09-22 DIAGNOSIS — O219 Vomiting of pregnancy, unspecified: Secondary | ICD-10-CM | POA: Insufficient documentation

## 2021-09-22 DIAGNOSIS — F1290 Cannabis use, unspecified, uncomplicated: Secondary | ICD-10-CM | POA: Diagnosis not present

## 2021-09-22 DIAGNOSIS — F1990 Other psychoactive substance use, unspecified, uncomplicated: Secondary | ICD-10-CM | POA: Insufficient documentation

## 2021-09-22 DIAGNOSIS — F319 Bipolar disorder, unspecified: Secondary | ICD-10-CM | POA: Insufficient documentation

## 2021-09-22 DIAGNOSIS — Z3A18 18 weeks gestation of pregnancy: Secondary | ICD-10-CM | POA: Insufficient documentation

## 2021-09-22 DIAGNOSIS — O99612 Diseases of the digestive system complicating pregnancy, second trimester: Secondary | ICD-10-CM | POA: Insufficient documentation

## 2021-09-22 LAB — COMPREHENSIVE METABOLIC PANEL
ALT: 11 U/L (ref 0–44)
AST: 15 U/L (ref 15–41)
Albumin: 2.9 g/dL — ABNORMAL LOW (ref 3.5–5.0)
Alkaline Phosphatase: 51 U/L (ref 38–126)
Anion gap: 10 (ref 5–15)
BUN: <5 mg/dL — ABNORMAL LOW (ref 6–20)
CO2: 21 mmol/L — ABNORMAL LOW (ref 22–32)
Calcium: 8.8 mg/dL — ABNORMAL LOW (ref 8.9–10.3)
Chloride: 107 mmol/L (ref 98–111)
Creatinine, Ser: 0.64 mg/dL (ref 0.44–1.00)
GFR, Estimated: >60 mL/min (ref >60)
Glucose, Bld: 114 mg/dL — ABNORMAL HIGH (ref 70–99)
Potassium: 3.2 mmol/L — ABNORMAL LOW (ref 3.5–5.1)
Sodium: 138 mmol/L (ref 135–145)
Total Bilirubin: 0.9 mg/dL (ref 0.3–1.2)
Total Protein: 5.8 g/dL — ABNORMAL LOW (ref 6.5–8.1)

## 2021-09-22 LAB — CBC WITH DIFFERENTIAL/PLATELET
Abs Immature Granulocytes: 0.1 10*3/uL — ABNORMAL HIGH (ref 0.00–0.07)
Basophils Absolute: 0 10*3/uL (ref 0.0–0.1)
Basophils Relative: 0 %
Eosinophils Absolute: 0 10*3/uL (ref 0.0–0.5)
Eosinophils Relative: 0 %
HCT: 31.8 % — ABNORMAL LOW (ref 36.0–46.0)
Hemoglobin: 11.2 g/dL — ABNORMAL LOW (ref 12.0–15.0)
Immature Granulocytes: 1 %
Lymphocytes Relative: 13 %
Lymphs Abs: 1.7 10*3/uL (ref 0.7–4.0)
MCH: 32.7 pg (ref 26.0–34.0)
MCHC: 35.2 g/dL (ref 30.0–36.0)
MCV: 92.7 fL (ref 80.0–100.0)
Monocytes Absolute: 0.9 10*3/uL (ref 0.1–1.0)
Monocytes Relative: 8 %
Neutro Abs: 9.7 10*3/uL — ABNORMAL HIGH (ref 1.7–7.7)
Neutrophils Relative %: 78 %
Platelets: 228 10*3/uL (ref 150–400)
RBC: 3.43 MIL/uL — ABNORMAL LOW (ref 3.87–5.11)
RDW: 12.5 % (ref 11.5–15.5)
WBC: 12.5 10*3/uL — ABNORMAL HIGH (ref 4.0–10.5)
nRBC: 0 % (ref 0.0–0.2)

## 2021-09-22 LAB — WET PREP, GENITAL
Clue Cells Wet Prep HPF POC: NONE SEEN
Sperm: NONE SEEN
Trich, Wet Prep: NONE SEEN
WBC, Wet Prep HPF POC: <10 (ref <10)
Yeast Wet Prep HPF POC: NONE SEEN

## 2021-09-22 MED ORDER — PROMETHAZINE HCL 25 MG PO TABS: 25.0000 mg | ORAL_TABLET | Freq: Four times a day (QID) | ORAL | 0 refills | Status: DC | PRN

## 2021-09-22 MED ORDER — HYDROMORPHONE HCL 1 MG/ML IJ SOLN
1.0000 mg | Freq: Once | INTRAMUSCULAR | Status: AC
  Administered 2021-09-22: 1 mg via INTRAVENOUS
  Filled 2021-09-22: qty 1

## 2021-09-22 MED ORDER — DOCUSATE SODIUM 250 MG PO CAPS: 250.0000 mg | ORAL_CAPSULE | Freq: Every day | ORAL | 0 refills | Status: DC

## 2021-09-22 MED ORDER — LACTATED RINGERS IV BOLUS
1000.0000 mL | Freq: Once | INTRAVENOUS | Status: AC
  Administered 2021-09-22: 1000 mL via INTRAVENOUS

## 2021-09-22 NOTE — Discharge Instructions (Signed)

## 2021-09-22 NOTE — MAU Provider Note (Addendum)
History     CSN: [id number]  Arrival date and time: 09/22/21 0457   None     Chief Complaint  Patient presents with   Abdominal Pain   HPI [name] [name] is a 36 y.o. G2P1001 at 43w4dwho presents to MAU via EMS for left lower quadrant abdominal pain. This is patient's 3rd ER visit in the last 24 hours. On arrival to unit, patient is hysterical and writing in pain. She reports ongoing LLQ abdominal pain that radiates into left leg that started 3 days ago as well and nausea and vomiting. She reports she has been unable to have a bowel movement since the pain started. Pain is improved with hot showers. She reports that she has stopped smoking marijuana and has been taking Tylenol as instructed without any relief. Tonight she reports passing a lot of mucous. She denies vaginal bleeding or leaking fluid. No urinary s/s or fever.  Pregnancy is significant for di/di twin pregnancy as well as polysubstance use and bipolar disorder. [facility]  OB History     Gravida  2   Para  1   Term  1   Preterm  0   AB  0   Living  1      SAB  0   IAB  0   Ectopic  0   Multiple  0   Live Births  1           Past Medical History:  Diagnosis Date   Anxiety    Bipolar disorder ([place]    Boils    Headache(784.0)    PCOS (polycystic ovarian syndrome) 02/26/2012   Recurrent UTI 05/15/2013   Neg 03/2020  E. Coli   Smoker 01/18/2019    Past Surgical History:  Procedure Laterality Date   LAPAROSCOPIC UNILATERAL SALPINGECTOMY Left    paratubal cystectomy    Family History  Problem Relation Age of Onset   Mental illness Mother    Thyroid disease Mother    Alcohol abuse Brother    Breast cancer Paternal Aunt     Social History   Tobacco Use   Smoking status: Every Day    Packs/day: 0.25    Types: Cigarettes   Smokeless tobacco: Never  Vaping Use   Vaping Use: Former   Substances: THC  Substance Use Topics   Alcohol use: Not  Currently    Comment: rare   Drug use: Not Currently    Types: Marijuana    Comment: smokes marijuana twice a week and no cocaine for almost a yearLAST  USE OF COCAINE MARCH,  LAST  MARIJUANA-  YESTERDAY     Allergies:  Allergies  Allergen Reactions   Ibuprofen Nausea And Vomiting    Stomach upset    Medications Prior to Admission  Medication Sig Dispense Refill Last Dose   Prenatal Vit-Fe Fumarate-FA (MULTIVITAMIN-PRENATAL) 27-0.8 MG TABS tablet Take 1 tablet by mouth daily at 12 noon.   Past Week   aspirin EC 81 MG tablet Take 1 tablet (81 mg total) by mouth daily. 60 tablet 2    nicotine polacrilex (NICOTINE MINI) 2 MG lozenge Take 1 lozenge (2 mg total) by mouth as needed for smoking cessation. 100 tablet 0    ondansetron (ZOFRAN) 8 MG tablet Take 1 tablet (8 mg total) by mouth every 8 (eight) hours as needed for nausea or vomiting. 20 tablet 0     Review of Systems  Constitutional: Negative.  Respiratory: Negative.    Cardiovascular: Negative.   Gastrointestinal:  Positive for abdominal pain, constipation, nausea and vomiting.  Genitourinary:  Positive for vaginal discharge. Negative for dysuria, pelvic pain and vaginal bleeding.  Musculoskeletal: Negative.   Neurological: Negative.    Physical Exam  Patient Vitals for the past 24 hrs:  BP Temp Temp src Pulse Resp SpO2 Height Weight  09/22/21 0503 126/85 98 F (36.7 C) Oral 76 (!) 22 100 % '5\' 11"'$  (1.803 m) 104.3 kg   Physical Exam Vitals and nursing note reviewed. Exam conducted with a chaperone present.  Constitutional:      General: She is in acute distress.  Eyes:     Extraocular Movements: Extraocular movements intact.     Pupils: Pupils are equal, round, and reactive to light.  Cardiovascular:     Rate and Rhythm: Normal rate.  Pulmonary:     Effort: Pulmonary effort is normal.  Abdominal:     Palpations: Abdomen is soft.     Tenderness: There is abdominal tenderness in the left lower quadrant.   Genitourinary:    Comments: Normal external female genitalia, vaginal walls pink with rugae, no blood, no pooling of amniotic fluid, scant white discharge, cervix visually closed without lesions/masses Musculoskeletal:     Cervical back: Normal range of motion.  Skin:    General: Skin is warm and dry.  Neurological:     General: No focal deficit present.     Mental Status: She is alert and oriented to person, place, and time.   Dilation: Closed Effacement (%): Thick Exam by:: Maryagnes Amos, CNM  MAU Course  Procedures Results for orders placed or performed during the hospital encounter of 09/22/21 (from the past 24 hour(s))  CBC with Differential/Platelet     Status: Abnormal   Collection Time: 09/22/21  5:50 AM  Result Value Ref Range   WBC 12.5 (H) 4.0 - 10.5 K/uL   RBC 3.43 (L) 3.87 - 5.11 MIL/uL   Hemoglobin 11.2 (L) 12.0 - 15.0 g/dL   HCT 31.8 (L) 36.0 - 46.0 %   MCV 92.7 80.0 - 100.0 fL   MCH 32.7 26.0 - 34.0 pg   MCHC 35.2 30.0 - 36.0 g/dL   RDW 12.5 11.5 - 15.5 %   Platelets 228 150 - 400 K/uL   nRBC 0.0 0.0 - 0.2 %   Neutrophils Relative % 78 %   Neutro Abs 9.7 (H) 1.7 - 7.7 K/uL   Lymphocytes Relative 13 %   Lymphs Abs 1.7 0.7 - 4.0 K/uL   Monocytes Relative 8 %   Monocytes Absolute 0.9 0.1 - 1.0 K/uL   Eosinophils Relative 0 %   Eosinophils Absolute 0.0 0.0 - 0.5 K/uL   Basophils Relative 0 %   Basophils Absolute 0.0 0.0 - 0.1 K/uL   Immature Granulocytes 1 %   Abs Immature Granulocytes 0.10 (H) 0.00 - 0.07 K/uL  Comprehensive metabolic panel     Status: Abnormal   Collection Time: 09/22/21  5:50 AM  Result Value Ref Range   Sodium 138 135 - 145 mmol/L   Potassium 3.2 (L) 3.5 - 5.1 mmol/L   Chloride 107 98 - 111 mmol/L   CO2 21 (L) 22 - 32 mmol/L   Glucose, Bld 114 (H) 70 - 99 mg/dL   BUN <5 (L) 6 - 20 mg/dL   Creatinine, Ser 0.64 0.44 - 1.00 mg/dL   Calcium 8.8 (L) 8.9 - 10.3 mg/dL   Total Protein 5.8 (L) 6.5 - 8.1  g/dL   Albumin 2.9 (L) 3.5 -  5.0 g/dL   AST 15 15 - 41 U/L   ALT 11 0 - 44 U/L   Alkaline Phosphatase 51 38 - 126 U/L   Total Bilirubin 0.9 0.3 - 1.2 mg/dL   GFR, Estimated >60 >60 mL/min   Anion gap 10 5 - 15  Wet prep, genital     Status: None   Collection Time: 09/22/21  6:05 AM  Result Value Ref Range   Yeast Wet Prep HPF POC NONE SEEN NONE SEEN   Trich, Wet Prep NONE SEEN NONE SEEN   Clue Cells Wet Prep HPF POC NONE SEEN NONE SEEN   WBC, Wet Prep HPF POC <10 <10   Sperm NONE SEEN     CT ABDOMEN PELVIS W CONTRAST  Result Date: 09/21/2021 CLINICAL DATA:  Abdominal pain, acute, nonlocalized. Patient is [redacted] weeks pregnant-she had [place] of pelvis and renal US earlier today without acute abnormalities Pregnancy Informed consent signed by patient and Dr. Jeanell Sparrow Abdominal pain EXAM: CT ABDOMEN AND PELVIS WITH CONTRAST TECHNIQUE: Multidetector CT imaging of the abdomen and pelvis was performed using the standard protocol following bolus administration of intravenous contrast. RADIATION DOSE REDUCTION: This exam was performed according to the departmental dose-optimization program which includes automated exposure control, adjustment of the mA and/or kV according to patient size and/or use of iterative reconstruction technique. CONTRAST:  174m OMNIPAQUE IOHEXOL 300 MG/ML  SOLN COMPARISON:  None Available. FINDINGS: Lower chest: No acute abnormality.  Query tiny hiatal hernia. Hepatobiliary: No focal liver abnormality. No gallstones, gallbladder wall thickening, or pericholecystic fluid. No biliary dilatation. Pancreas: No focal lesion. Normal pancreatic contour. No surrounding inflammatory changes. No main pancreatic ductal dilatation. Spleen: Normal in size without focal abnormality. Adrenals/Urinary Tract: No adrenal nodule bilaterally. Bilateral kidneys enhance symmetrically. No hydronephrosis. No hydroureter. The urinary bladder is unremarkable. Stomach/Bowel: Stomach is within normal limits. No evidence of bowel wall thickening or  dilatation. Appendix appears normal. Vascular/Lymphatic: No abdominal aorta or iliac aneurysm. No abdominal, pelvic, or inguinal lymphadenopathy. Reproductive: Gravid uterus with twin pregnancy. Otherwise uterus and bilateral adnexa are unremarkable. Other: No intraperitoneal free fluid. No intraperitoneal free gas. No organized fluid collection. Musculoskeletal: Small fat containing umbilical hernia. No suspicious lytic or blastic osseous lesions. No acute displaced fracture. L5-S1 intervertebral disc space narrowing and vacuum phenomenon. IMPRESSION: 1. Query tiny hiatal hernia. Otherwise no acute intra-abdominal or intrapelvic abnormality. 2. Gravid uterus with twin pregnancy. Please see separately dictated ultrasound Ob 09/21/2021. Electronically Signed   By: MIven FinnM.D.   On: 09/21/2021 16:05      MDM On arrival to unit, unable to assess patient as she was extremely uncomfortable. '1mg'$  of dilaudid IV given. 1L LR ordered. Bedside ultrasound performed by S. WJeronimo Greaves CNM. 2 separate FHR's detected. See her note for further detail.   After medication, I was able to appropriately assess patient. Spec exam benign without evidence of ROM or cervical dilation. Cervix closed/thick/posterior. CBC, CMP unremarkable. Wet prep negative. GC/CT pending.  Soaps suds enema given with small bowel movement  Care turned over to C. NMaryruth Hancock CNM at [place]07/23/23 8:00 AM  Patient had a good bowel movement and denies any pain at this time.   Assessment and Plan   1. Constipation during pregnancy in second trimester   2. [redacted] weeks gestation of pregnancy   3. Abdominal pain affecting pregnancy    -Discharge home in stable condition -Rx for phenergan and  colace sent to patient's pharmacy -Abdominal pain precautions discussed -Patient advised to follow-up with OB as scheduled for prenatal care -Patient may return to MAU as needed or if her condition were to change or  worsen  Wende Mott, CNM 09/22/21. 8:59 AM

## 2021-09-22 NOTE — MAU Note (Signed)
.  [name] [name] is a 36 y.o. at [redacted]w[redacted]d twin pregnancy here in MAU via EMS reporting lower abdominal contractions and back pain that began this morning (10/10). Patient is crying and having difficulty moving from EMS stretcher due to pain. Patient is also having some nausea. Patient arrived with 20 gauge SL IV. Denies VB or LOF, but does report that she lost her mucous plug earlier last night.   Onset of complaint: today Pain score: 10/10 Vitals:   09/22/21 0503  BP: 126/85  Pulse: 76  Resp: (!) 22  Temp: 98 F (36.7 C)  SpO2: 100%     FHT: A- 146 via doppler DMaryagnes Amos CNM and SMallie Snooks CNM performed bedside [facility]oreato [facility]

## 2021-09-23 LAB — GC/CHLAMYDIA PROBE AMP (~~LOC~~) NOT AT ARMC
Chlamydia: NEGATIVE
Comment: NEGATIVE
Comment: NORMAL
Neisseria Gonorrhea: NEGATIVE

## 2021-09-23 NOTE — Progress Notes (Unsigned)
   PRENATAL VISIT NOTE  Subjective:  [name] [name] is a 36 y.o. G2P1001 at 12w0dbeing seen today for ongoing prenatal care.  She is currently monitored for the following issues for this high-risk pregnancy and has Polysubstance abuse ([place]; Bipolar disorder, curr episode mixed, severe, with psychotic features (HB[place]; AMA (advanced maternal age) multigravida 367+ Tobacco use affecting pregnancy, antepartum; Hip instability, left; Increased SMA carrier risk on Horizon testing; Labral tear of left hip joint; Dichorionic diamniotic twin gestation; Supervision of high risk pregnancy, antepartum; and Short interval between pregnancies affecting pregnancy in second trimester, antepartum on their problem list.  Patient reports {sx:[id number]}.   .  .   . Denies leaking of fluid.   The following portions of the patient's history were reviewed and updated as appropriate: allergies, current medications, past family history, past medical history, past social history, past surgical history and problem list.   Objective:  There were no vitals filed for this visit.  Fetal Status:           General:  Alert, oriented and cooperative. Patient is in no acute distress.  Skin: Skin is warm and dry. No rash noted.   Cardiovascular: Normal heart rate noted  Respiratory: Normal respiratory effort, no problems with respiration noted  Abdomen: Soft, gravid, appropriate for gestational age.        Pelvic: Cervical exam deferred        Extremities: Normal range of motion.     Mental Status: Normal mood and affect. Normal behavior. Normal judgment and thought content.   Assessment and Plan:  Pregnancy: G2P1001 at [redacted]w[redacted]d. Polysubstance abuse (HCAvondaleReviewed recent use during the pregnancy: ***  2. Multigravida of advanced maternal age in second trimester LR nips  3. Tobacco use affecting pregnancy, antepartum Reviewed current tobacco abuse - ***  4. Dichorionic diamniotic twin pregnancy in second  trimester USKoreaoday was done for anatomy - ***  5. Supervision of high risk pregnancy, antepartum Declined MSAFP  6. Short interval between pregnancies affecting pregnancy in second trimester, antepartum   Preterm labor symptoms and general obstetric precautions including but not limited to vaginal bleeding, contractions, leaking of fluid and fetal movement were reviewed in detail with the patient. Please refer to After Visit Summary for other counseling recommendations.   No follow-ups on file.  Future Appointments  Date Time Provider DePadroni7/26/2023 10:15 AM [facility]7/26/2023 10:30 AM WMC-MFC US3 WMC-MFCUS [facility]7/26/2023  2:35 PM DuRadene GunningMD WMExec[facility]8/03/2021  8:15 AM WM[facility]MC[facility]  PaRadene GunningMD

## 2021-09-23 NOTE — BH Specialist Note (Signed)
Integrated Behavioral Health via Telemedicine Visit  10/01/2021 [place] [id number]  Number of Integrated Behavioral Health Clinician visits: 1- Initial Visit  Session Start time: [phone]   Session End time: 0911  Total time in minutes: 47   Referring Provider: Clayton Lefort, MD Patient/Family location: Home [facility] Provider location: Center for [place] [facility]  All persons participating in visit: [name] and [place]   Types of Service: Individual psychotherapy and Video visit  I connected with Donald Anzel White Bobbitt and/or [employer] Bobbitt's  daughter  via  Telephone or Geologist, engineering  (Video is Caregility application) and verified that I am speaking with the correct person using two identifiers. Discussed confidentiality: Yes   I discussed the limitations of telemedicine and the availability of in person appointments.  Discussed there is a possibility of technology failure and discussed alternative modes of communication if that failure occurs.  I discussed that engaging in this telemedicine visit, they consent to the provision of behavioral healthcare and the services will be billed under their insurance.  Patient and/or legal guardian expressed understanding and consented to Telemedicine visit: Yes   Presenting Concerns: Patient and/or family reports the following symptoms/concerns: Adjusting to pregnancy with twins ("excited but nervous"; more aches/pains), lack of anticipated support from [name]; goals are  to decrease marijuana use to zero for optimal health in pregnancy and postpartum time, as well as find bigger housing before twins are born;  prefers no BH medication and not happy about receiving antipsychotic in hospital to "help calm down" as it had opposite effect and caused her to feel more anxious. Duration of problem: Current pregnancy; Severity of  problem: moderate  Patient and/or Family's Strengths/Protective Factors: Social connections, Concrete supports in place (healthy food, safe environments, etc.), and Sense of purpose  Goals Addressed: Patient will:  Reduce symptoms of: anxiety and stress   Increase knowledge and/or ability of: healthy habits   Demonstrate ability to: Increase healthy adjustment to current life circumstances and Decrease self-medicating behaviors  Progress towards Goals: Ongoing  Interventions: Interventions utilized:  Motivational Interviewing and Psychoeducation and/or Health Education Standardized Assessments completed: Not Needed  Patient and/or Family Response: Patient agrees with treatment plan.   Assessment: Patient currently experiencing Bipolar affective disorder (previously diagnosed); Cannabis use disorder  Patient may benefit from psychoeducation and brief therapeutic interventions regarding coping with symptoms of anxiety and current life stress .  Plan: Follow up with behavioral health clinician on : Two weeks Behavioral recommendations:  -Continue taking prenatal vitamin daily -Continue using water (uncrowded pool; warm baths/shower) to help manage increased aches/pains of twin pregnancy and feeling of calm -Continue prioritizing healthy self-care (regular meals; adequate sleep) daily -Accept referral to [facility] for outpatient treatment; consider walk-in hours as needed to establish care quicker, as needed  Referral(s): Integrated Orthoptist (In Clinic) and [facility] (LME/Outside Clinic)  I discussed the assessment and treatment plan with the patient and/or parent/guardian. They were provided an opportunity to ask questions and all were answered. They agreed with the plan and demonstrated an understanding of the instructions.   They were advised to call back or seek an in-person evaluation if the symptoms worsen or if the condition fails to  improve as anticipated.  Caroleen Hamman Taylorann Tkach, LCSW

## 2021-09-25 ENCOUNTER — Encounter: Payer: Self-pay | Admitting: Obstetrics and Gynecology

## 2021-09-25 ENCOUNTER — Ambulatory Visit: Payer: Medicaid Other | Attending: Certified Nurse Midwife

## 2021-09-25 ENCOUNTER — Other Ambulatory Visit: Payer: Self-pay | Admitting: *Deleted

## 2021-09-25 ENCOUNTER — Ambulatory Visit: Payer: Medicaid Other | Admitting: *Deleted

## 2021-09-25 ENCOUNTER — Telehealth (INDEPENDENT_AMBULATORY_CARE_PROVIDER_SITE_OTHER): Payer: Medicaid Other | Admitting: Obstetrics and Gynecology

## 2021-09-25 ENCOUNTER — Encounter: Payer: Self-pay | Admitting: *Deleted

## 2021-09-25 VITALS — BP 92/51 | HR 86

## 2021-09-25 DIAGNOSIS — O30042 Twin pregnancy, dichorionic/diamniotic, second trimester: Secondary | ICD-10-CM

## 2021-09-25 DIAGNOSIS — O99330 Smoking (tobacco) complicating pregnancy, unspecified trimester: Secondary | ICD-10-CM

## 2021-09-25 DIAGNOSIS — O99332 Smoking (tobacco) complicating pregnancy, second trimester: Secondary | ICD-10-CM | POA: Diagnosis not present

## 2021-09-25 DIAGNOSIS — Z3A19 19 weeks gestation of pregnancy: Secondary | ICD-10-CM

## 2021-09-25 DIAGNOSIS — O0990 Supervision of high risk pregnancy, unspecified, unspecified trimester: Secondary | ICD-10-CM | POA: Insufficient documentation

## 2021-09-25 DIAGNOSIS — O99892 Other specified diseases and conditions complicating childbirth: Secondary | ICD-10-CM

## 2021-09-25 DIAGNOSIS — O99212 Obesity complicating pregnancy, second trimester: Secondary | ICD-10-CM | POA: Diagnosis not present

## 2021-09-25 DIAGNOSIS — O09522 Supervision of elderly multigravida, second trimester: Secondary | ICD-10-CM | POA: Diagnosis not present

## 2021-09-25 DIAGNOSIS — O09899 Supervision of other high risk pregnancies, unspecified trimester: Secondary | ICD-10-CM

## 2021-09-25 DIAGNOSIS — M549 Dorsalgia, unspecified: Secondary | ICD-10-CM

## 2021-09-25 DIAGNOSIS — O0932 Supervision of pregnancy with insufficient antenatal care, second trimester: Secondary | ICD-10-CM

## 2021-09-25 DIAGNOSIS — O99322 Drug use complicating pregnancy, second trimester: Secondary | ICD-10-CM

## 2021-09-25 DIAGNOSIS — F1910 Other psychoactive substance abuse, uncomplicated: Secondary | ICD-10-CM

## 2021-09-25 DIAGNOSIS — Z362 Encounter for other antenatal screening follow-up: Secondary | ICD-10-CM

## 2021-09-25 DIAGNOSIS — O09892 Supervision of other high risk pregnancies, second trimester: Secondary | ICD-10-CM

## 2021-09-25 DIAGNOSIS — O0992 Supervision of high risk pregnancy, unspecified, second trimester: Secondary | ICD-10-CM

## 2021-09-25 MED ORDER — CYCLOBENZAPRINE HCL 10 MG PO TABS: 10.0000 mg | ORAL_TABLET | Freq: Three times a day (TID) | ORAL | 2 refills | Status: DC | PRN

## 2021-10-01 ENCOUNTER — Ambulatory Visit (INDEPENDENT_AMBULATORY_CARE_PROVIDER_SITE_OTHER): Payer: Medicaid Other | Admitting: Clinical

## 2021-10-01 DIAGNOSIS — F1290 Cannabis use, unspecified, uncomplicated: Secondary | ICD-10-CM

## 2021-10-01 DIAGNOSIS — F319 Bipolar disorder, unspecified: Secondary | ICD-10-CM | POA: Diagnosis not present

## 2021-10-01 NOTE — Patient Instructions (Signed)
Center for [facility] [place], [place] [address] [phone] (main office) [phone] [facility] office)  [facility]  [address] [place],  [address] [phone] or [phone] [facility] 24/7 FOR ANYONE [address] [place], [place] Fax: [phone] [email or web address] *Interpreters available *Accepts all insurance and uninsured for Urgent Care needs *Accepts Medicaid and uninsured for outpatient treatment     ONLY FOR [facility]  New patient assessment and therapy walk-ins Mondays and Wednesdays 8am-11am First and second Fridays 1pm-5pm  New patient psychiatry and medication management walk-ins:  Mondays, Wednesdays, Thursdays, Fridays 8am -11am NO PSYCHIATRY WALK-INS on TUESDAYS

## 2021-10-02 NOTE — BH Specialist Note (Signed)
Integrated Behavioral Health via Telemedicine Visit  10/02/2021 [place] [id number]  Number of Integrated Behavioral Health Clinician visits: 1- Initial Visit  Session Start time: [phone]   Session End time: 0911  Total time in minutes: 47   Referring Provider: Clayton Lefort, MD Patient/Family location: Home [facility] Provider location: Center for [place] at [facility]  All persons participating in visit: [name] and [place]   Types of Service: Individual psychotherapy and Telephone visit  I connected with [name] and/or [name]'s  n/a   via  Telephone or Video Enabled Telemedicine Application  (Video is Caregility application) and verified that I am speaking with the correct person using two identifiers. Discussed confidentiality: Yes   I discussed the limitations of telemedicine and the availability of in person appointments.  Discussed there is a possibility of technology failure and discussed alternative modes of communication if that failure occurs.  I discussed that engaging in this telemedicine visit, they consent to the provision of behavioral healthcare and the services will be billed under their insurance.  Patient and/or legal guardian expressed understanding and consented to Telemedicine visit: Yes   Presenting Concerns: Patient and/or family reports the following symptoms/concerns: Painful to sit down, stand, walk or lie down,  feels like her pubic bone has been "split in half, like it's come apart or separated" and cannot close her legs all the way due to pain; feeling pressure pushing down. Pt is coping with the pain by sleeping every time daughter is sleeping, limiting unnecessary movements and holding onto hope with a positive outlook. (Visit is cut short, as pt's phone is almost dead and too painful to get up to plug in). Duration of problem: Current  pregnancy ; Severity of problem:  moderately severe    Patient and/or Family's Strengths/Protective Factors: Social connections, Concrete supports in place (healthy food, safe environments, etc.), and Sense of purpose  Goals Addressed: Patient will:  Reduce symptoms of: anxiety and stress     Demonstrate ability to: Increase healthy adjustment to current life circumstances  Progress towards Goals: Ongoing  Interventions: Interventions utilized:  Solution-Focused Strategies Standardized Assessments completed: Not Needed  Patient and/or Family Response: Patient agrees with treatment plan.   Assessment: Patient currently experiencing Bipolar affective disorder (previously diagnosed) ; Cannabis use disorder .   Patient may benefit from continued therapeutic interventions .  Plan: Follow up with behavioral health clinician on : Two week Behavioral recommendations:  -Continue taking prenatal vitamins daily --Continue prioritizing healthy self-care (regular meals, adequate rest) daily -Continue using warm showers to help manage physical discomfort and increase feelings of calm, as able -Continue accepting referral to [facility]; use walk-in hours as needed -Continue plan to attend upcoming physical therapy and ob appointments  Referral(s): [place] (In Clinic)  I discussed the assessment and treatment plan with the patient and/or parent/guardian. They were provided an opportunity to ask questions and all were answered. They agreed with the plan and demonstrated an understanding of the instructions.   They were advised to call back or seek an in-person evaluation if the symptoms worsen or if the condition fails to improve as anticipated.  [name], LCSW     10/01/2020   11:19 AM 09/27/2020    9:12 AM 08/27/2020   11:09 AM 08/13/2020    9:12 AM 07/02/2020   10:21 AM  Depression screen PHQ 2/9  Decreased Interest 0 0 0  0 0  Down, Depressed, Hopeless 0  0 0 0 0  PHQ - 2 Score 0 0 0 0 0  Altered sleeping 0 0 0 0 1  Tired, decreased energy 0 0 0 0 0  Change in appetite 0 0 0 0 1  Feeling bad or failure about yourself  0 0 0 0 0  Trouble concentrating 0 0 0 0 0  Moving slowly or fidgety/restless 0 0 0 0 0  Suicidal thoughts 0 0 0 0 0  PHQ-9 Score 0 0 0 0 2  Difficult doing work/chores     Not difficult at all      10/01/2020   11:19 AM 09/27/2020    9:12 AM 08/27/2020   11:09 AM 08/13/2020    9:12 AM  GAD 7 : Generalized Anxiety Score  Nervous, Anxious, on Edge 0 0 0 0  Control/stop worrying 0 0 0 0  Worry too much - different things 0 0 0 0  Trouble relaxing 0 0 0 0  Restless 0 0 0 0  Easily annoyed or irritable 0 0 0 0  Afraid - awful might happen 0 0 0 0  Total GAD 7 Score 0 0 0 0

## 2021-10-16 ENCOUNTER — Ambulatory Visit: Payer: Medicaid Other | Admitting: Clinical

## 2021-10-16 DIAGNOSIS — F319 Bipolar disorder, unspecified: Secondary | ICD-10-CM

## 2021-10-16 DIAGNOSIS — F1290 Cannabis use, unspecified, uncomplicated: Secondary | ICD-10-CM

## 2021-10-21 NOTE — BH Specialist Note (Addendum)
Integrated Behavioral Health via Telemedicine Visit  10/31/2021 [place] [id number]  Number of Integrated Behavioral Health Clinician visits: 3- Third Visit  Session Start time: 1403   Session End time: 4536  Total time in minutes: 12   Referring Provider: Clayton Lefort, MD Patient/Family location: Home [facility] Provider location: [facility] for [place] at [facility]  All persons participating in visit: [name] and [place]   Types of Service: Individual psychotherapy and Video visit  I connected with [place] and/or [name] [name]'s  n/a  via  Telephone or Geologist, engineering  (Video is Caregility application) and verified that I am speaking with the correct person using two identifiers. Discussed confidentiality: Yes   I discussed the limitations of telemedicine and the availability of in person appointments.  Discussed there is a possibility of technology failure and discussed alternative modes of communication if that failure occurs.  I discussed that engaging in this telemedicine visit, they consent to the provision of behavioral healthcare and the services will be billed under their insurance.  Patient and/or legal guardian expressed understanding and consented to Telemedicine visit: Yes   Presenting Concerns: Patient and/or family reports the following symptoms/concerns: Disability suspended due to non-verification of address and found out "baby's head was in birth canal", causing her pain that has now decreased as baby moved position; stress over upcoming move.  Duration of problem: Current pregnancy ; Severity of problem:  moderately severe    Patient and/or Family's Strengths/Protective Factors: Social connections, Concrete supports in place (healthy food, safe environments, etc.), Sense of purpose, and Physical Health (exercise, healthy diet,  medication compliance, etc.)  Goals Addressed: Patient will:  Reduce symptoms of: anxiety and stress   Demonstrate ability to: Increase motivation to adhere to plan of care  Progress towards Goals: Ongoing  Interventions: Interventions utilized:  Supportive Reflection Standardized Assessments completed: Not Needed  Patient and/or Family Response:  Patient agrees with treatment plan.    Assessment: Patient currently experiencing Bipolar affective disorder (previously diagnosed); Cannabis use disorder .   Patient may benefit from continued therapeutic interventions .  Plan: Follow up with behavioral health clinician on : Two weeks Behavioral recommendations:  -Continue taking prenatal vitamins daily as prescribed -Continue plan to attend upcoming appointments (physical therapy, ob, ultrasound) as scheduled -Continue using self-coping strategies to manage depression, anxiety and physical pain of current pregnancy Referral(s): [place] (In Clinic)  I discussed the assessment and treatment plan with the patient and/or parent/guardian. They were provided an opportunity to ask questions and all were answered. They agreed with the plan and demonstrated an understanding of the instructions.   They were advised to call back or seek an in-person evaluation if the symptoms worsen or if the condition fails to improve as anticipated.  Caroleen Hamman [name], LCSW

## 2021-10-23 ENCOUNTER — Ambulatory Visit: Payer: Medicaid Other | Admitting: *Deleted

## 2021-10-23 ENCOUNTER — Other Ambulatory Visit: Payer: Self-pay | Admitting: *Deleted

## 2021-10-23 ENCOUNTER — Ambulatory Visit: Payer: Medicaid Other | Attending: Obstetrics and Gynecology

## 2021-10-23 VITALS — BP 114/66 | HR 97

## 2021-10-23 DIAGNOSIS — O09522 Supervision of elderly multigravida, second trimester: Secondary | ICD-10-CM | POA: Insufficient documentation

## 2021-10-23 DIAGNOSIS — F17210 Nicotine dependence, cigarettes, uncomplicated: Secondary | ICD-10-CM

## 2021-10-23 DIAGNOSIS — Z3A23 23 weeks gestation of pregnancy: Secondary | ICD-10-CM

## 2021-10-23 DIAGNOSIS — O99332 Smoking (tobacco) complicating pregnancy, second trimester: Secondary | ICD-10-CM | POA: Diagnosis not present

## 2021-10-23 DIAGNOSIS — E669 Obesity, unspecified: Secondary | ICD-10-CM

## 2021-10-23 DIAGNOSIS — O0990 Supervision of high risk pregnancy, unspecified, unspecified trimester: Secondary | ICD-10-CM | POA: Insufficient documentation

## 2021-10-23 DIAGNOSIS — Z362 Encounter for other antenatal screening follow-up: Secondary | ICD-10-CM | POA: Insufficient documentation

## 2021-10-23 DIAGNOSIS — O30042 Twin pregnancy, dichorionic/diamniotic, second trimester: Secondary | ICD-10-CM | POA: Diagnosis present

## 2021-10-23 DIAGNOSIS — O99322 Drug use complicating pregnancy, second trimester: Secondary | ICD-10-CM

## 2021-10-23 DIAGNOSIS — F1290 Cannabis use, unspecified, uncomplicated: Secondary | ICD-10-CM

## 2021-10-23 DIAGNOSIS — O09899 Supervision of other high risk pregnancies, unspecified trimester: Secondary | ICD-10-CM | POA: Diagnosis present

## 2021-10-23 DIAGNOSIS — O99212 Obesity complicating pregnancy, second trimester: Secondary | ICD-10-CM

## 2021-10-23 NOTE — Progress Notes (Unsigned)
Us/

## 2021-10-31 ENCOUNTER — Ambulatory Visit: Payer: Medicaid Other | Admitting: Clinical

## 2021-10-31 DIAGNOSIS — F1290 Cannabis use, unspecified, uncomplicated: Secondary | ICD-10-CM

## 2021-10-31 DIAGNOSIS — F319 Bipolar disorder, unspecified: Secondary | ICD-10-CM

## 2021-11-06 ENCOUNTER — Encounter (HOSPITAL_COMMUNITY): Payer: Self-pay | Admitting: Obstetrics & Gynecology

## 2021-11-06 ENCOUNTER — Other Ambulatory Visit: Payer: Self-pay

## 2021-11-06 ENCOUNTER — Inpatient Hospital Stay (HOSPITAL_COMMUNITY)
Admission: AD | Admit: 2021-11-06 | Discharge: 2021-11-06 | Disposition: A | Discharge status: Home / Self Care | Payer: Medicaid Other | Attending: Obstetrics & Gynecology | Admitting: Obstetrics & Gynecology

## 2021-11-06 DIAGNOSIS — Z3A25 25 weeks gestation of pregnancy: Secondary | ICD-10-CM | POA: Diagnosis not present

## 2021-11-06 DIAGNOSIS — O30042 Twin pregnancy, dichorionic/diamniotic, second trimester: Secondary | ICD-10-CM | POA: Diagnosis not present

## 2021-11-06 DIAGNOSIS — B349 Viral infection, unspecified: Secondary | ICD-10-CM

## 2021-11-06 DIAGNOSIS — U071 COVID-19: Secondary | ICD-10-CM | POA: Diagnosis not present

## 2021-11-06 DIAGNOSIS — O98512 Other viral diseases complicating pregnancy, second trimester: Secondary | ICD-10-CM

## 2021-11-06 LAB — URINALYSIS, ROUTINE W REFLEX MICROSCOPIC
Bilirubin Urine: NEGATIVE
Glucose, UA: NEGATIVE mg/dL
Hgb urine dipstick: NEGATIVE
Ketones, ur: NEGATIVE mg/dL
Leukocytes,Ua: NEGATIVE
Nitrite: NEGATIVE
Protein, ur: 30 mg/dL — AB
Specific Gravity, Urine: 1.021 (ref 1.005–1.030)
pH: 8 (ref 5.0–8.0)

## 2021-11-06 LAB — RESP PANEL BY RT-PCR (FLU A&B, COVID) ARPGX2
Influenza A by PCR: NEGATIVE
Influenza B by PCR: NEGATIVE
SARS Coronavirus 2 by RT PCR: POSITIVE — AB

## 2021-11-06 MED ORDER — ACETAMINOPHEN 500 MG PO TABS
1000.0000 mg | ORAL_TABLET | Freq: Once | ORAL | Status: AC
Start: 2021-11-06 — End: 2021-11-06
  Administered 2021-11-06: 1000 mg via ORAL
  Filled 2021-11-06: qty 2

## 2021-11-06 MED ORDER — NIRMATRELVIR/RITONAVIR (PAXLOVID)TABLET: 3.0000 | ORAL_TABLET | Freq: Two times a day (BID) | ORAL | 0 refills | Status: AC

## 2021-11-06 NOTE — Therapy (Deleted)
OUTPATIENT PHYSICAL THERAPY FEMALE PELVIC EVALUATION   Patient Name: [name] [name] MRN: [id number] DOB:Jun 06, 1985, 36 y.o., female Today's Date: 11/06/2021    Past Medical History:  Diagnosis Date   Anxiety    Bipolar disorder ([place])    Boils    Headache(784.0)    PCOS (polycystic ovarian syndrome) 02/26/2012   Recurrent UTI 05/15/2013   Neg 03/2020  E. Coli   Smoker 01/18/2019   Past Surgical History:  Procedure Laterality Date   LAPAROSCOPIC UNILATERAL SALPINGECTOMY Left    paratubal cystectomy   Patient Active Problem List   Diagnosis Date Noted   Short interval between pregnancies affecting pregnancy in second trimester, antepartum 09/10/2021   Dichorionic diamniotic twin gestation 07/24/2021   Supervision of high risk pregnancy, antepartum 07/24/2021   Labral tear of left hip joint 09/10/2020   Increased SMA carrier risk on Horizon testing 08/06/2020   Hip instability, left 07/02/2020   AMA (advanced maternal age) multigravida 35+ 03/23/2020   Tobacco use affecting pregnancy, antepartum 03/23/2020   Bipolar disorder, curr episode mixed, severe, with psychotic features ([place]) 08/02/2014   Polysubstance abuse ([place]) 05/12/2013    PCP: None  REFERRING PROVIDER: Radene Gunning, MD  REFERRING DIAG:  O09.522 (ICD-10-CM) - Multigravida of advanced maternal age in second trimester  O99.891,M54.9 (ICD-10-CM) - Back pain affecting pregnancy in second trimester    THERAPY DIAG:  No diagnosis found.  Rationale for Evaluation and Treatment Rehabilitation  ONSET DATE: ***  SUBJECTIVE:                                                                                                                                                                                           SUBJECTIVE STATEMENT: *** Fluid intake: {Yes/No:304960894}    PAIN:  Are you having pain? {yes/no:20286} NPRS scale: ***/10 Pain location: {pelvic pain location:27098}  Pain type:  {type:313116} Pain description: {PAIN DESCRIPTION:21022940}   Aggravating factors: *** Relieving factors: ***  PRECAUTIONS: Other: pregnant  WEIGHT BEARING RESTRICTIONS {Yes ***/No:24003}  FALLS:  Has patient fallen in last 6 months? {fallsyesno:27318}  LIVING ENVIRONMENT: Lives with: {OPRC lives with:25569::"lives with their family"} Lives in: {Lives in:25570} Stairs: {opstairs:27293} Has following equipment at home: {Assistive devices:23999}  OCCUPATION: ***  PLOF: {PLOF:24004}  PATIENT GOALS ***  PERTINENT HISTORY:  PCOS; Bipolar; Laparoscopic unilateral salpingectomy left; labral tear of left hip joint;  Sexual abuse: {Yes/No:304960894}  BOWEL MOVEMENT Pain with bowel movement: {yes/no:20286} Type of bowel movement:{PT BM type:27100} Fully empty rectum: {Yes/No:304960894} Leakage: {Yes/No:304960894} Pads: {Yes/No:304960894} Fiber supplement: {Yes/No:304960894}  URINATION Pain with urination: {yes/no:20286} Fully empty bladder: {Yes/No:304960894} Stream: {PT urination:27102} Urgency: {Yes/No:304960894} Frequency: *** Leakage: {PT leakage:27103}  Pads: {Yes/No:304960894}  INTERCOURSE Pain with intercourse: {pain with intercourse PA:27099} Ability to have vaginal penetration:  {Yes/No:304960894} Climax: *** Marinoff Scale: ***/3  PREGNANCY Vaginal deliveries *** Tearing {Yes***/No:304960894} C-section deliveries *** Currently pregnant Yes: *** last vaginal birth was 10/07/2020  PROLAPSE {PT prolapse:27101}    OBJECTIVE:   DIAGNOSTIC FINDINGS:  ***  PATIENT SURVEYS:  {rehab surveys:24030}  PFIQ-7 ***  COGNITION:  Overall cognitive status: {cognition:24006}     SENSATION:  Light touch: {intact/deficits:24005}  Proprioception: {intact/deficits:24005}  MUSCLE LENGTH: Hamstrings: Right *** deg; Left *** deg Thomas test: Right *** deg; Left *** deg  LUMBAR SPECIAL TESTS:  {lumbar special test:25242}  FUNCTIONAL TESTS:  {Functional  tests:24029}  GAIT: Distance walked: *** Assistive device utilized: {Assistive devices:23999} Level of assistance: {Levels of assistance:24026} Comments: ***               POSTURE: {posture:25561}   PELVIC ALIGNMENT:  LUMBARAROM/PROM  A/PROM A/PROM  eval  Flexion   Extension   Right lateral flexion   Left lateral flexion   Right rotation   Left rotation    (Blank rows = not tested)  LOWER EXTREMITY ROM:  {AROM/PROM:27142} ROM Right eval Left eval  Hip flexion    Hip extension    Hip abduction    Hip adduction    Hip internal rotation    Hip external rotation    Knee flexion    Knee extension    Ankle dorsiflexion    Ankle plantarflexion    Ankle inversion    Ankle eversion     (Blank rows = not tested)  LOWER EXTREMITY MMT:  MMT Right eval Left eval  Hip flexion    Hip extension    Hip abduction    Hip adduction    Hip internal rotation    Hip external rotation    Knee flexion    Knee extension    Ankle dorsiflexion    Ankle plantarflexion    Ankle inversion    Ankle eversion      PALPATION:   General  ***                External Perineal Exam ***                             Internal Pelvic Floor ***  Patient confirms identification and approves PT to assess internal pelvic floor and treatment {yes/no:20286}  PELVIC MMT:   MMT eval  Vaginal   Internal Anal Sphincter   External Anal Sphincter   Puborectalis   Diastasis Recti   (Blank rows = not tested)        TONE: ***  PROLAPSE: ***  TODAY'S TREATMENT  EVAL ***   PATIENT EDUCATION:  Education details: *** Person educated: {Person educated:25204} Education method: {Education Method:25205} Education comprehension: {Education Comprehension:25206}   HOME EXERCISE PROGRAM: ***  ASSESSMENT:  CLINICAL IMPRESSION: Patient is a *** y.o. *** who was seen today for physical therapy evaluation and treatment for ***.    OBJECTIVE IMPAIRMENTS {opptimpairments:25111}.    ACTIVITY LIMITATIONS {activitylimitations:27494}  PARTICIPATION LIMITATIONS: {participationrestrictions:25113}  PERSONAL FACTORS {Personal factors:25162} are also affecting patient's functional outcome.   REHAB POTENTIAL: {rehabpotential:25112}  CLINICAL DECISION MAKING: {clinical decision making:25114}  EVALUATION COMPLEXITY: {Evaluation complexity:25115}   GOALS: Goals reviewed with patient? {yes/no:20286}  SHORT TERM GOALS: Target date: {follow up:25551}  *** Baseline: Goal status: {GOALSTATUS:25110}  2.  *** Baseline:  Goal status: {GOALSTATUS:25110}  3.  *** Baseline:  Goal status: {GOALSTATUS:25110}  4.  *** Baseline:  Goal status: {GOALSTATUS:25110}  5.  *** Baseline:  Goal status: {GOALSTATUS:25110}  6.  *** Baseline:  Goal status: {GOALSTATUS:25110}  LONG TERM GOALS: Target date: {follow up:25551}   *** Baseline:  Goal status: {GOALSTATUS:25110}  2.  *** Baseline:  Goal status: {GOALSTATUS:25110}  3.  *** Baseline:  Goal status: {GOALSTATUS:25110}  4.  *** Baseline:  Goal status: {GOALSTATUS:25110}  5.  *** Baseline:  Goal status: {GOALSTATUS:25110}  6.  *** Baseline:  Goal status: {GOALSTATUS:25110}  PLAN: PT FREQUENCY: {rehab frequency:25116}  PT DURATION: {rehab duration:25117}  PLANNED INTERVENTIONS: {rehab planned interventions:25118::"Therapeutic exercises","Therapeutic activity","Neuromuscular re-education","Balance training","Gait training","Patient/Family education","Self Care","Joint mobilization"}  PLAN FOR NEXT SESSION: ***   Daviona Herbert, PT 11/06/2021, 8:59 AM

## 2021-11-06 NOTE — MAU Note (Signed)
..  [name] [name] is a 36 y.o. at 62w0dhere in MAU reporting: chills, headache, congestion and body aches that began today. Has been taking her temperature all day and it was 97.5 and now it is elevated at 99.1 Denies ctx, vaginal bleeding, or leaking of fluid. +FM  Pain score: 8/10 headache. 7/10 body aches Vitals:   11/06/21 0048 11/06/21 0049  BP:  (!) 94/46  Pulse:  98  Resp:  20  Temp:  99.1 F (37.3 C)  SpO2: 98%      FPPI:RJJOACZin room

## 2021-11-06 NOTE — BH Specialist Note (Signed)
Integrated Behavioral Health via Telemedicine Visit  11/14/2021 [place] [id number]  Number of [place] Clinician visits: 4- Fourth Visit  Session Start time: 4174   Session End time: 0814  Total time in minutes: 25   Referring Provider: Clayton Lefort, MD Patient/Family location: Home [facility] Provider location: [facility] for [place] [facility]  All persons participating in visit: [name] and [name] [name]   Types of Service: Individual psychotherapy and Video visit  I connected with [name] [name] and/or [name] [name]'s  n/a  via  Telephone or Geologist, engineering  (Video is Caregility application) and verified that I am speaking with the correct person using two identifiers. Discussed confidentiality: Yes   I discussed the limitations of telemedicine and the availability of in person appointments.  Discussed there is a possibility of technology failure and discussed alternative modes of communication if that failure occurs.  I discussed that engaging in this telemedicine visit, they consent to the provision of behavioral healthcare and the services will be billed under their insurance.  Patient and/or legal guardian expressed understanding and consented to Telemedicine visit: Yes   Presenting Concerns: Patient and/or family reports the following symptoms/concerns: Life stress increase (court appointments regarding housing/eviction); requests next appointment medical switch to virtual. Duration of problem: Current pregnancy; Severity of problem:  moderately severe  Patient and/or Family's Strengths/Protective Factors: Social connections, Sense of purpose, and Physical Health (exercise, healthy diet, medication compliance, etc.)  Goals Addressed: Patient will:  Reduce symptoms of: anxiety and stress   Increase knowledge and/or ability of: stress  reduction   Demonstrate ability to: Increase healthy adjustment to current life circumstances  Progress towards Goals: Ongoing  Interventions: Interventions utilized:  Supportive Reflection Standardized Assessments completed: Not Needed  Patient and/or Family Response: Patient agrees with treatment plan.   Assessment: Patient currently experiencing Bipolar affective disorder and Psychosocial stress.   Patient may benefit from continued therapeutic interventions.  Plan: Follow up with behavioral health clinician on : Two weeks Behavioral recommendations:  -Continue taking prenatal vitamins daily -Continue self-coping strategies daily -Continue plan to move to new housing asap Referral(s): [place] (In Clinic)  I discussed the assessment and treatment plan with the patient and/or parent/guardian. They were provided an opportunity to ask questions and all were answered. They agreed with the plan and demonstrated an understanding of the instructions.   They were advised to call back or seek an in-person evaluation if the symptoms worsen or if the condition fails to improve as anticipated.  [name] [name] [name] Janusz, LCSW

## 2021-11-06 NOTE — Discharge Instructions (Signed)

## 2021-11-06 NOTE — MAU Provider Note (Signed)
History     [id number]  Arrival date and time: 11/06/21 0013    Chief Complaint  Patient presents with   Chills     HPI [name] [name] is a 36 y.o. at 53w0dwho presents for chills & body aches. Symptoms started this morning. States her baby had a fever & congestion which she attributed to teething. Symptoms include body aches, chills, temp of 99.5, headache, & nasal congestion.  Denies sore throat, cough, chest pain, shortness of breath, abdominal pain, dysuria, n/v/d, vaginal bleeding, or LOF.  Positive fetal movement x 2.  Hasn't taken anything for her symptoms.    OB History     Gravida  2   Para  1   Term  1   Preterm  0   AB  0   Living  1      SAB  0   IAB  0   Ectopic  0   Multiple  0   Live Births  1           Past Medical History:  Diagnosis Date   Anxiety    Bipolar disorder ([place]    Boils    Headache(784.0)    PCOS (polycystic ovarian syndrome) 02/26/2012   Recurrent UTI 05/15/2013   Neg 03/2020  E. Coli   Smoker 01/18/2019    Past Surgical History:  Procedure Laterality Date   LAPAROSCOPIC UNILATERAL SALPINGECTOMY Left    paratubal cystectomy    Family History  Problem Relation Age of Onset   Mental illness Mother    Thyroid disease Mother    Asthma Sister    Asthma Brother    Alcohol abuse Brother    Cancer Paternal Aunt    Breast cancer Paternal Aunt    Hypertension Maternal Grandmother    Birth defects Neg Hx    Diabetes Neg Hx    Heart disease Neg Hx    Stroke Neg Hx     Allergies  Allergen Reactions   Ibuprofen Nausea And Vomiting    Stomach upset    No current facility-administered medications on file prior to encounter.   Current Outpatient Medications on File Prior to Encounter  Medication Sig Dispense Refill   Prenatal Vit-Fe Fumarate-FA (MULTIVITAMIN-PRENATAL) 27-0.8 MG TABS tablet Take 1 tablet by mouth daily at 12 noon.     cyclobenzaprine (FLEXERIL) 10 MG tablet Take 1 tablet (10 mg  total) by mouth 3 (three) times daily as needed for muscle spasms. 30 tablet 2   ondansetron (ZOFRAN) 8 MG tablet Take 1 tablet (8 mg total) by mouth every 8 (eight) hours as needed for nausea or vomiting. 20 tablet 0   promethazine (PHENERGAN) 25 MG tablet Take 1 tablet (25 mg total) by mouth every 6 (six) hours as needed for nausea or vomiting. 30 tablet 0   [DISCONTINUED] benztropine (COGENTIN) 0.5 MG tablet Take 1 tablet (0.5 mg total) by mouth at bedtime. (Patient taking differently: Take 0.5 mg by mouth daily. ) 30 tablet 0   [DISCONTINUED] lamoTRIgine (LAMICTAL) 25 MG tablet Take 1 tablet (25 mg total) by mouth daily. For mood stability 30 tablet 0     ROS Pertinent positives and negative per HPI, all others reviewed and negative  Physical Exam   BP (!) 94/46 (BP Location: Right Arm)   Pulse 98   Temp 99.3 F (37.4 C) (Oral)   Resp 20   Ht '5\' 11"'$  (1.803 m)   Wt 105.2 kg   LMP 05/15/2021 Comment:  Doctor and patient signed consent form  SpO2 98%   BMI 32.36 kg/m   Patient Vitals for the past 24 hrs:  BP Temp Temp src Pulse Resp SpO2 Height Weight  11/06/21 0233 -- 99.3 F (37.4 C) Oral -- -- -- -- --  11/06/21 0049 (!) 94/46 99.1 F (37.3 C) Oral 98 20 -- '5\' 11"'$  (1.803 m) 105.2 kg  11/06/21 0048 -- -- -- -- -- 98 % -- --    Physical Exam Vitals and nursing note reviewed.  Constitutional:      Appearance: Normal appearance. She is ill-appearing. She is not diaphoretic.  HENT:     Head: Normocephalic and atraumatic.  Eyes:     General: No scleral icterus.    Conjunctiva/sclera: Conjunctivae normal.  Cardiovascular:     Rate and Rhythm: Normal rate and regular rhythm.     Heart sounds: Normal heart sounds.  Pulmonary:     Effort: Pulmonary effort is normal. No respiratory distress.     Breath sounds: Normal breath sounds. No wheezing.  Skin:    General: Skin is warm and dry.  Neurological:     Mental Status: She is alert.  Psychiatric:        Mood and Affect:  Mood normal.        Behavior: Behavior normal.      Labs Results for orders placed or performed during the hospital encounter of 11/06/21 (from the past 24 hour(s))  Urinalysis, Routine w reflex microscopic Anterior Nasal Swab     Status: Abnormal   Collection Time: 11/06/21  1:46 AM  Result Value Ref Range   Color, Urine YELLOW YELLOW   APPearance CLEAR CLEAR   Specific Gravity, Urine 1.021 1.005 - 1.030   pH 8.0 5.0 - 8.0   Glucose, UA NEGATIVE NEGATIVE mg/dL   Hgb urine dipstick NEGATIVE NEGATIVE   Bilirubin Urine NEGATIVE NEGATIVE   Ketones, ur NEGATIVE NEGATIVE mg/dL   Protein, ur 30 (A) NEGATIVE mg/dL   Nitrite NEGATIVE NEGATIVE   Leukocytes,Ua NEGATIVE NEGATIVE   RBC / HPF 0-5 0 - 5 RBC/hpf   WBC, UA 0-5 0 - 5 WBC/hpf   Bacteria, UA RARE (A) NONE SEEN   Squamous Epithelial / LPF 0-5 0 - 5   Mucus PRESENT     Imaging No results found.  MAU Course  Procedures Lab Orders         Resp Panel by RT-PCR (Flu A&B, Covid) Anterior Nasal Swab         Urinalysis, Routine w reflex microscopic Urine, Clean Catch     Meds ordered this encounter  Medications   acetaminophen (TYLENOL) tablet 1,000 mg   Imaging Orders  No imaging studies ordered today    MDM COVID/flu swab collected  Given tylenol & oral fluids in MAU.  Patient requesting to be discharged home due to childcare issues. She is stable for discharge.   Unable to get continuous tracing on both babies due to maternal positioning & gestational age. FHT present x 2 & fetal movement appreciated on exam. Patient has no OB complaints.  Assessment and Plan   1. Viral illness   2. Dichorionic diamniotic twin pregnancy in second trimester   3. [redacted] weeks gestation of pregnancy    -given list of OTC meds safe in pregnancy & instructed to take medication for symptoms -Respiratory swab results pending   Jorje Guild, NP 11/06/21 2:36 AM

## 2021-11-07 ENCOUNTER — Encounter: Payer: Medicaid Other | Admitting: Family Medicine

## 2021-11-07 ENCOUNTER — Encounter: Payer: Medicaid Other | Attending: Obstetrics and Gynecology | Admitting: Physical Therapy

## 2021-11-14 ENCOUNTER — Ambulatory Visit: Payer: Medicaid Other | Admitting: Clinical

## 2021-11-14 DIAGNOSIS — F319 Bipolar disorder, unspecified: Secondary | ICD-10-CM

## 2021-11-14 DIAGNOSIS — Z658 Other specified problems related to psychosocial circumstances: Secondary | ICD-10-CM

## 2021-11-18 NOTE — BH Specialist Note (Signed)
Integrated Behavioral Health via Telemedicine Visit  11/28/2021 [place] [id number]  Number of [place] Clinician visits: 5-Fifth Visit  Session Start time: 7078   Session End time: 6754  Total time in minutes: 17   Referring Provider: Clayton Lefort, MD Patient/Family location: Home [facility] Provider location: [facility] for [place] at [facility]  All persons participating in visit: [name]   Types of Service: Individual psychotherapy and Video visit  I connected with [name] [name] and/or [name] [name]'s  n/a  via  Telephone or Geologist, engineering  (Video is Caregility application) and verified that I am speaking with the correct person using two identifiers. Discussed confidentiality: Yes   I discussed the limitations of telemedicine and the availability of in person appointments.  Discussed there is a possibility of technology failure and discussed alternative modes of communication if that failure occurs.  I discussed that engaging in this telemedicine visit, they consent to the provision of behavioral healthcare and the services will be billed under their insurance.  Patient and/or legal guardian expressed understanding and consented to Telemedicine visit: Yes   Presenting Concerns: Patient and/or family reports the following symptoms/concerns: Fatigue, discomfort;allergy symptoms Duration of problem: Ongoing; Severity of problem: moderate  Patient and/or Family's Strengths/Protective Factors: Social connections, Concrete supports in place (healthy food, safe environments, etc.), Sense of purpose, and Physical Health (exercise, healthy diet, medication compliance, etc.)  Goals Addressed: Patient will:  Maintain reduction of symptoms of: anxiety and depression    Demonstrate ability to:   Progress towards  Goals: Ongoing  Interventions: Interventions utilized:  Supportive Reflection Standardized Assessments completed: Not Needed  Patient and/or Family Response: Patient agrees with treatment plan.   Assessment: Patient currently experiencing Bipolar affective disorder .   Patient may benefit from continued therapeutic interventions.  Plan: Follow up with behavioral health clinician on : One month; Call [name] [name] at [phone], as needed. Behavioral recommendations:  --Continue prioritizing healthy self-care (regular meals, adequate rest) -Continue taking prenatal vitamin daily -Continue plan to pick up maternity belt at upcoming medical appointment -Continue "nesting", pacing activities to level of comfort as able, while settling into new home Referral(s): [name] [name] (In Clinic)  I discussed the assessment and treatment plan with the patient and/or parent/guardian. They were provided an opportunity to ask questions and all were answered. They agreed with the plan and demonstrated an understanding of the instructions.   They were advised to call back or seek an in-person evaluation if the symptoms worsen or if the condition fails to improve as anticipated.  Caroleen Hamman Loistine Eberlin, LCSW

## 2021-11-19 ENCOUNTER — Telehealth (INDEPENDENT_AMBULATORY_CARE_PROVIDER_SITE_OTHER): Payer: Medicaid Other | Admitting: Family Medicine

## 2021-11-19 ENCOUNTER — Encounter: Payer: Self-pay | Admitting: Family Medicine

## 2021-11-19 DIAGNOSIS — O0990 Supervision of high risk pregnancy, unspecified, unspecified trimester: Secondary | ICD-10-CM

## 2021-11-19 DIAGNOSIS — O09522 Supervision of elderly multigravida, second trimester: Secondary | ICD-10-CM

## 2021-11-19 DIAGNOSIS — O99322 Drug use complicating pregnancy, second trimester: Secondary | ICD-10-CM

## 2021-11-19 DIAGNOSIS — O30042 Twin pregnancy, dichorionic/diamniotic, second trimester: Secondary | ICD-10-CM

## 2021-11-19 DIAGNOSIS — F3164 Bipolar disorder, current episode mixed, severe, with psychotic features: Secondary | ICD-10-CM

## 2021-11-19 DIAGNOSIS — O99330 Smoking (tobacco) complicating pregnancy, unspecified trimester: Secondary | ICD-10-CM

## 2021-11-19 DIAGNOSIS — O99332 Smoking (tobacco) complicating pregnancy, second trimester: Secondary | ICD-10-CM

## 2021-11-19 DIAGNOSIS — F1910 Other psychoactive substance abuse, uncomplicated: Secondary | ICD-10-CM

## 2021-11-19 DIAGNOSIS — Z3A26 26 weeks gestation of pregnancy: Secondary | ICD-10-CM

## 2021-11-19 NOTE — Progress Notes (Signed)
I connected with [name] [name] 11/19/21 at 10:35 AM EDT by: MyChart video and verified that I am speaking with the correct person using two identifiers.  Patient is located at home and provider is located at [facility].     I discussed the limitations, risks, security and privacy concerns of performing an evaluation and management service by MyChart video and the availability of in person appointments. I also discussed with the patient that there may be a patient responsible charge related to this service. By engaging in this virtual visit, you consent to the provision of healthcare.  Additionally, you authorize for your insurance to be billed for the services provided during this visit.  The patient expressed understanding and agreed to proceed.  The following staff members participated in the virtual visit:  Clarnce Flock, MD/MPH Attending Family Medicine Physician, [place] for [facility]     PRENATAL VISIT NOTE  Subjective:  [name] [name] is a 36 y.o. G2P1001 at [redacted]w[redacted]d for virtual visit for ongoing prenatal care.  She is currently monitored for the following issues for this high-risk pregnancy and has Polysubstance abuse (HAscension; Bipolar disorder, curr episode mixed, severe, with psychotic features (HKetchikan Gateway; AMA (advanced maternal age) multigravida 357+ Tobacco use affecting pregnancy, antepartum; Hip instability, left; Increased SMA carrier risk on Horizon testing; Labral tear of left hip joint; Dichorionic diamniotic twin gestation; Supervision of high risk pregnancy, antepartum; and Short interval between pregnancies affecting pregnancy in second trimester, antepartum on their problem list.  Patient reports  a bit of a cough but getting better .  Contractions: Not present. Vag. Bleeding: None.  Movement: Present. Denies leaking of fluid.   The following portions of the patient's history were reviewed and  updated as appropriate: allergies, current medications, past family history, past medical history, past social history, past surgical history and problem list.   Objective:  There were no vitals filed for this visit. Self-Obtained  Fetal Status:     Movement: Present     Assessment and Plan:  Pregnancy: G2P1001 at [redacted]w[redacted]d. Supervision of high risk pregnancy, antepartum Endorses good fetal movement Discussed importance of regular prenatal visits, has not been seen in office since initial OB visit [place] [facility] episode of "blacking out" a few days ago, vision got dark, she felt hot and sweaty Discussed this is a classic vasovagal episode, benign etiology, needs to make sure she stays hydrated and takes position changes slowly Coming in two days for MFM testing, will schedule her for third trimester labs on that day   2. Dichorionic diamniotic twin pregnancy in second trimester Following w MFM EFW 26%, 0% discordance last USKoreaas follow up in two days Maternity belt feels like its too small Will get her one at next in person visit  3. Tobacco use affecting pregnancy, antepartum Occasional loosies  4. Multigravida of advanced maternal age in second trimester   5. Bipolar disorder, curr episode mixed, severe, with psychotic features (HCBlandFollowing w JaRoselyn Reef6. Polysubstance abuse ([name]) THC sporadically  Preterm labor symptoms and general obstetric precautions including but not limited to vaginal bleeding, contractions, leaking of fluid and fetal movement were reviewed in detail with the patient.  Return in 2 weeks (on 12/03/2021) for [facility], 28 wk labs.  Future Appointments  Date Time Provider DeBuckland  11/21/2021 10:30 AM WMC-MFC NURSE [facility]  11/21/2021 10:45 AM WMC-MFC US6 WMC-MFCUS [facility]  11/28/2021  1:45 PM WMC-BEHAVIORAL HEALTH CLINICIAN WMC-CWH [facility]      Time spent on virtual visit: 20 minutes  Clarnce Flock, MD

## 2021-11-19 NOTE — Patient Instructions (Signed)

## 2021-11-20 ENCOUNTER — Other Ambulatory Visit: Payer: Self-pay

## 2021-11-20 DIAGNOSIS — O0990 Supervision of high risk pregnancy, unspecified, unspecified trimester: Secondary | ICD-10-CM

## 2021-11-21 ENCOUNTER — Ambulatory Visit: Payer: Medicaid Other | Attending: Obstetrics

## 2021-11-21 ENCOUNTER — Other Ambulatory Visit: Payer: Medicaid Other

## 2021-11-21 ENCOUNTER — Encounter: Payer: Medicaid Other | Admitting: Physical Therapy

## 2021-11-21 ENCOUNTER — Ambulatory Visit: Payer: Medicaid Other | Admitting: *Deleted

## 2021-11-21 ENCOUNTER — Telehealth: Payer: Self-pay | Admitting: General Practice

## 2021-11-21 ENCOUNTER — Other Ambulatory Visit: Payer: Self-pay | Admitting: *Deleted

## 2021-11-21 ENCOUNTER — Other Ambulatory Visit: Payer: Self-pay

## 2021-11-21 VITALS — BP 115/60 | HR 87

## 2021-11-21 DIAGNOSIS — F17210 Nicotine dependence, cigarettes, uncomplicated: Secondary | ICD-10-CM | POA: Diagnosis not present

## 2021-11-21 DIAGNOSIS — O09522 Supervision of elderly multigravida, second trimester: Secondary | ICD-10-CM

## 2021-11-21 DIAGNOSIS — O0990 Supervision of high risk pregnancy, unspecified, unspecified trimester: Secondary | ICD-10-CM

## 2021-11-21 DIAGNOSIS — O09892 Supervision of other high risk pregnancies, second trimester: Secondary | ICD-10-CM

## 2021-11-21 DIAGNOSIS — Z720 Tobacco use: Secondary | ICD-10-CM

## 2021-11-21 DIAGNOSIS — O30042 Twin pregnancy, dichorionic/diamniotic, second trimester: Secondary | ICD-10-CM | POA: Diagnosis not present

## 2021-11-21 DIAGNOSIS — O99322 Drug use complicating pregnancy, second trimester: Secondary | ICD-10-CM

## 2021-11-21 DIAGNOSIS — E669 Obesity, unspecified: Secondary | ICD-10-CM

## 2021-11-21 DIAGNOSIS — Z3A27 27 weeks gestation of pregnancy: Secondary | ICD-10-CM

## 2021-11-21 DIAGNOSIS — F1910 Other psychoactive substance abuse, uncomplicated: Secondary | ICD-10-CM

## 2021-11-21 DIAGNOSIS — O99212 Obesity complicating pregnancy, second trimester: Secondary | ICD-10-CM

## 2021-11-21 DIAGNOSIS — O99332 Smoking (tobacco) complicating pregnancy, second trimester: Secondary | ICD-10-CM | POA: Diagnosis not present

## 2021-11-21 MED ORDER — BLOOD PRESSURE KIT: 1.0000 | PACK | Freq: Once | 0 refills | Status: AC

## 2021-11-21 NOTE — Telephone Encounter (Signed)
Open in error

## 2021-11-21 NOTE — Telephone Encounter (Signed)
Patient called into front office requesting to pick up maternity support belt & blood pressure cuff. Patient was informed a prescription for a blood pressure cuff would be sent to [facility] and the maternity support belt could be picked up at the front desk.

## 2021-11-22 LAB — CBC
Hematocrit: 32.5 % — ABNORMAL LOW (ref 34.0–46.6)
Hemoglobin: 11.3 g/dL (ref 11.1–15.9)
MCH: 32.9 pg (ref 26.6–33.0)
MCHC: 34.8 g/dL (ref 31.5–35.7)
MCV: 95 fL (ref 79–97)
Platelets: 260 10*3/uL (ref 150–450)
RBC: 3.43 x10E6/uL — ABNORMAL LOW (ref 3.77–5.28)
RDW: 12.9 % (ref 11.7–15.4)
WBC: 12.8 10*3/uL — ABNORMAL HIGH (ref 3.4–10.8)

## 2021-11-22 LAB — RPR: RPR Ser Ql: NONREACTIVE

## 2021-11-22 LAB — GLUCOSE TOLERANCE, 2 HOURS W/ 1HR
Glucose, 1 hour: 128 mg/dL (ref 70–179)
Glucose, 2 hour: 127 mg/dL (ref 70–152)
Glucose, Fasting: 75 mg/dL (ref 70–91)

## 2021-11-22 LAB — HIV ANTIBODY (ROUTINE TESTING W REFLEX): HIV Screen 4th Generation wRfx: NONREACTIVE

## 2021-11-28 ENCOUNTER — Encounter: Payer: Self-pay | Admitting: Physical Therapy

## 2021-11-28 ENCOUNTER — Ambulatory Visit (INDEPENDENT_AMBULATORY_CARE_PROVIDER_SITE_OTHER): Payer: Medicaid Other | Admitting: Clinical

## 2021-11-28 DIAGNOSIS — F319 Bipolar disorder, unspecified: Secondary | ICD-10-CM

## 2021-11-28 NOTE — Patient Instructions (Signed)
[facility] at [facility] [address] [place], [place] [address] [phone] (main office) [phone] (Denorris Reust's office)   

## 2021-12-03 ENCOUNTER — Telehealth (INDEPENDENT_AMBULATORY_CARE_PROVIDER_SITE_OTHER): Payer: Medicaid Other | Admitting: Family Medicine

## 2021-12-03 ENCOUNTER — Encounter: Payer: Self-pay | Admitting: Family Medicine

## 2021-12-03 DIAGNOSIS — F1910 Other psychoactive substance abuse, uncomplicated: Secondary | ICD-10-CM

## 2021-12-03 DIAGNOSIS — O09293 Supervision of pregnancy with other poor reproductive or obstetric history, third trimester: Secondary | ICD-10-CM

## 2021-12-03 DIAGNOSIS — Z3A28 28 weeks gestation of pregnancy: Secondary | ICD-10-CM

## 2021-12-03 DIAGNOSIS — F17210 Nicotine dependence, cigarettes, uncomplicated: Secondary | ICD-10-CM

## 2021-12-03 DIAGNOSIS — O99333 Smoking (tobacco) complicating pregnancy, third trimester: Secondary | ICD-10-CM

## 2021-12-03 DIAGNOSIS — O09523 Supervision of elderly multigravida, third trimester: Secondary | ICD-10-CM

## 2021-12-03 DIAGNOSIS — F319 Bipolar disorder, unspecified: Secondary | ICD-10-CM

## 2021-12-03 DIAGNOSIS — O99323 Drug use complicating pregnancy, third trimester: Secondary | ICD-10-CM

## 2021-12-03 DIAGNOSIS — O0990 Supervision of high risk pregnancy, unspecified, unspecified trimester: Secondary | ICD-10-CM

## 2021-12-03 DIAGNOSIS — O99343 Other mental disorders complicating pregnancy, third trimester: Secondary | ICD-10-CM

## 2021-12-03 DIAGNOSIS — O30043 Twin pregnancy, dichorionic/diamniotic, third trimester: Secondary | ICD-10-CM

## 2021-12-03 NOTE — Progress Notes (Signed)
I connected with [name] [name] 12/03/21 at  3:35 PM EDT by: MyChart video and verified that I am speaking with the correct person using two identifiers.  Patient is located at home and provider is located at [facility].     I discussed the limitations, risks, security and privacy concerns of performing an evaluation and management service by MyChart video and the availability of in person appointments. I also discussed with the patient that there may be a patient responsible charge related to this service. By engaging in this virtual visit, you consent to the provision of healthcare.  Additionally, you authorize for your insurance to be billed for the services provided during this visit.  The patient expressed understanding and agreed to proceed.  The following staff members participated in the virtual visit:  Clarnce Flock, MD/MPH Attending Family Medicine Physician, [place] for [facility]     PRENATAL VISIT NOTE  Subjective:  [name] [name] is a 36 y.o. G2P1001 at [redacted]w[redacted]d for virtual visit for ongoing prenatal care.  She is currently monitored for the following issues for this high-risk pregnancy and has Polysubstance abuse (HPender[place]; Bipolar disorder, curr episode mixed, severe, with psychotic features (HSupreme; AMA (advanced maternal age) multigravida 370+ Tobacco use affecting pregnancy, antepartum; Hip instability, left; Increased SMA carrier risk on Horizon testing; Labral tear of left hip joint; Dichorionic diamniotic twin gestation; Supervision of high risk pregnancy, antepartum; and Short interval between pregnancies affecting pregnancy in second trimester, antepartum on their problem list.  Patient reports nausea.  Contractions: Not present. Vag. Bleeding: None.  Movement: Present. Denies leaking of fluid.   The following portions of the patient's history were reviewed and updated as appropriate: allergies,  current medications, past family history, past medical history, past social history, past surgical history and problem list.   Objective:  There were no vitals filed for this visit. Self-Obtained  Fetal Status:     Movement: Present     Assessment and Plan:  Pregnancy: G2P1001 at [redacted]w[redacted]d. Supervision of high risk pregnancy, antepartum Endorses good fetal movement Came for 28wk labs after last visit, they were unremarkable  Discussed need for in person visits with high risk twin pregnancy, as well as need to sign BTL papers and get TDAP   2. Dichorionic diamniotic twin pregnancy in second trimester Following w MFM 7% discordance last USKoreapresentation breech/vertex Following closely w MFM   3. Tobacco use affecting pregnancy, antepartum Occasional loosies   4. Multigravida of advanced maternal age in second trimester     5. Bipolar disorder, curr episode mixed, severe, with psychotic features (HCSneadsFollowing w JaRoselyn Reef 6. Polysubstance abuse ([name]) THC sporadically  Preterm labor symptoms and general obstetric precautions including but not limited to vaginal bleeding, contractions, leaking of fluid and fetal movement were reviewed in detail with the patient.  Return in 2 weeks (on 12/17/2021) for [facility]ob visit.  Future Appointments  Date Time Provider DeCambridge10/23/2023  9:15 AM WMC-MFC NURSE [facility]10/23/2023  9:30 AM W[facility]MFC US3 [facility]FC[facility]10/25/2023  4:15 PM EcClarnce FlockMD WMChildren[facility]MP[facility]10/26/2023  8:15 AM WMC-MFC NURSE [facility]10/26/2023  8:30 AM WMC-MFC US2 [facility]10/26/2023 10:45 AM WMC-BEHAVIORAL HEALTH CLINICIAN WMC-CWH [facility]11/04/2021 10:30 AM WMC-MFC NURSE [facility]11/04/2021 10:45 AM WMC-MFC US5 [facility]11/11/2021 10:30 AM WMC[facility]MFC NURSE [facility]11/11/2021 10:45  AM WMC-MFC US5 [facility]MFCUS [facility]  01/16/2022 10:30 AM WMC-MFC NURSE [facility]MFC [facility]  01/16/2022 10:45 AM WMC-MFC US5 WMC-MFCUS [place]     Time spent on  virtual visit: 10 minutes  Clarnce Flock, MD

## 2021-12-03 NOTE — Patient Instructions (Signed)

## 2021-12-05 ENCOUNTER — Encounter: Payer: Self-pay | Admitting: Physical Therapy

## 2021-12-23 ENCOUNTER — Ambulatory Visit: Payer: Medicaid Other

## 2021-12-25 ENCOUNTER — Encounter: Payer: Medicaid Other | Admitting: Obstetrics and Gynecology

## 2021-12-25 ENCOUNTER — Ambulatory Visit (INDEPENDENT_AMBULATORY_CARE_PROVIDER_SITE_OTHER): Payer: Medicaid Other | Admitting: Family Medicine

## 2021-12-25 VITALS — BP 107/69 | HR 96 | Wt 241.7 lb

## 2021-12-25 DIAGNOSIS — F3164 Bipolar disorder, current episode mixed, severe, with psychotic features: Secondary | ICD-10-CM

## 2021-12-25 DIAGNOSIS — O09523 Supervision of elderly multigravida, third trimester: Secondary | ICD-10-CM

## 2021-12-25 DIAGNOSIS — Z3A32 32 weeks gestation of pregnancy: Secondary | ICD-10-CM

## 2021-12-25 DIAGNOSIS — O0990 Supervision of high risk pregnancy, unspecified, unspecified trimester: Secondary | ICD-10-CM

## 2021-12-25 DIAGNOSIS — O09892 Supervision of other high risk pregnancies, second trimester: Secondary | ICD-10-CM

## 2021-12-25 DIAGNOSIS — O30043 Twin pregnancy, dichorionic/diamniotic, third trimester: Secondary | ICD-10-CM

## 2021-12-25 NOTE — Patient Instructions (Signed)

## 2021-12-25 NOTE — Progress Notes (Signed)
   Subjective:  Kataleah Anzel Yael Coppess is a 36 y.o. G2P1001 at 83w0dbeing seen today for ongoing prenatal care.  She is currently monitored for the following issues for this high-risk pregnancy and has Polysubstance abuse (HShort; Bipolar disorder, curr episode mixed, severe, with psychotic features (HShort; AMA (advanced maternal age) multigravida 353+ Tobacco use affecting pregnancy, antepartum; Hip instability, left; Increased SMA carrier risk on Horizon testing; Labral tear of left hip joint; Dichorionic diamniotic twin gestation; Supervision of high risk pregnancy, antepartum; and Short interval between pregnancies affecting pregnancy in second trimester, antepartum on their problem list.  Patient reports  being extremely uncomfortable, wants to be delivered at 36 weeks .  Contractions: Irritability. Vag. Bleeding: None.  Movement: Present. Denies leaking of fluid.   The following portions of the patient's history were reviewed and updated as appropriate: allergies, current medications, past family history, past medical history, past social history, past surgical history and problem list. Problem list updated.  Objective:   Vitals:   12/25/21 1631  BP: 107/69  Pulse: 96  Weight: 241 lb 11.2 oz (109.6 kg)    Fetal Status: Fetal Heart Rate (bpm): 130/134   Movement: Present     General:  Alert, oriented and cooperative. Patient is in no acute distress.  Skin: Skin is warm and dry. No rash noted.   Cardiovascular: Normal heart rate noted  Respiratory: Normal respiratory effort, no problems with respiration noted  Abdomen: Soft, gravid, appropriate for gestational age. Pain/Pressure: Present     Pelvic: Vag. Bleeding: None     Cervical exam deferred        Extremities: Normal range of motion.     Mental Status: Normal mood and affect. Normal behavior. Normal judgment and thought content.   Urinalysis:      Assessment and Plan:  Pregnancy: G2P1001 at 318w0d1. Supervision of high  risk pregnancy, antepartum BP and FHR normal x2 BTL papers signed today  2. Dichorionic diamniotic twin pregnancy in third trimester Has not had an USKoreaor a month, unclear why Has follow up scheduled for tomorrow, she is aware of need for weekly testing Discussed delivery is not indicated until 38 weeks unless there are complications or MFM recs saying to do so earlier  3. Bipolar disorder, curr episode mixed, severe, with psychotic features (HCWarfieldFollowing w Jamie  4. Multigravida of advanced maternal age in third trimester   5. Short interval between pregnancies affecting pregnancy in second trimester, antepartum   Preterm labor symptoms and general obstetric precautions including but not limited to vaginal bleeding, contractions, leaking of fluid and fetal movement were reviewed in detail with the patient. Please refer to After Visit Summary for other counseling recommendations.  Return in 2 weeks (on 01/08/2022) for HRSpecial[facility]ob visit, needs MD.   EcClarnce FlockMD

## 2021-12-26 ENCOUNTER — Other Ambulatory Visit: Payer: Self-pay | Admitting: *Deleted

## 2021-12-26 ENCOUNTER — Ambulatory Visit: Payer: Medicaid Other | Admitting: *Deleted

## 2021-12-26 ENCOUNTER — Ambulatory Visit: Payer: Medicaid Other | Admitting: Clinical

## 2021-12-26 ENCOUNTER — Encounter: Payer: Self-pay | Admitting: *Deleted

## 2021-12-26 ENCOUNTER — Ambulatory Visit: Payer: Medicaid Other | Attending: Maternal & Fetal Medicine

## 2021-12-26 VITALS — BP 120/60 | HR 104

## 2021-12-26 DIAGNOSIS — O09892 Supervision of other high risk pregnancies, second trimester: Secondary | ICD-10-CM | POA: Insufficient documentation

## 2021-12-26 DIAGNOSIS — Z720 Tobacco use: Secondary | ICD-10-CM | POA: Insufficient documentation

## 2021-12-26 DIAGNOSIS — O99333 Smoking (tobacco) complicating pregnancy, third trimester: Secondary | ICD-10-CM

## 2021-12-26 DIAGNOSIS — O09522 Supervision of elderly multigravida, second trimester: Secondary | ICD-10-CM | POA: Diagnosis present

## 2021-12-26 DIAGNOSIS — O30042 Twin pregnancy, dichorionic/diamniotic, second trimester: Secondary | ICD-10-CM | POA: Diagnosis present

## 2021-12-26 DIAGNOSIS — O99213 Obesity complicating pregnancy, third trimester: Secondary | ICD-10-CM

## 2021-12-26 DIAGNOSIS — F17210 Nicotine dependence, cigarettes, uncomplicated: Secondary | ICD-10-CM | POA: Diagnosis not present

## 2021-12-26 DIAGNOSIS — Z3A32 32 weeks gestation of pregnancy: Secondary | ICD-10-CM

## 2021-12-26 DIAGNOSIS — O285 Abnormal chromosomal and genetic finding on antenatal screening of mother: Secondary | ICD-10-CM

## 2021-12-26 DIAGNOSIS — O0990 Supervision of high risk pregnancy, unspecified, unspecified trimester: Secondary | ICD-10-CM

## 2021-12-26 DIAGNOSIS — O09523 Supervision of elderly multigravida, third trimester: Secondary | ICD-10-CM

## 2021-12-26 DIAGNOSIS — O09899 Supervision of other high risk pregnancies, unspecified trimester: Secondary | ICD-10-CM

## 2021-12-26 DIAGNOSIS — E669 Obesity, unspecified: Secondary | ICD-10-CM

## 2021-12-26 DIAGNOSIS — O30043 Twin pregnancy, dichorionic/diamniotic, third trimester: Secondary | ICD-10-CM

## 2021-12-26 DIAGNOSIS — Z148 Genetic carrier of other disease: Secondary | ICD-10-CM

## 2021-12-26 NOTE — BH Specialist Note (Signed)
Pt reschedule visit to 2:15pm 01/01/22

## 2021-12-31 NOTE — BH Specialist Note (Unsigned)
Integrated Behavioral Health via Telemedicine Visit  01/01/2022 [name] [name] [name] [name] [id number]  Number of Integrated Behavioral Health Clinician visits: 6-Sixth Visit  Session Start time: 3953   Session End time: 2023  Total time in minutes: 42   Referring Provider: Clayton Lefort, MD Patient/Family location: Home [facility] Provider location: Center for [place] [facility]  All persons participating in visit: [name] and Lead   Types of Service: Individual psychotherapy and Video visit  I connected with [place] and/or Ravynn Anzel [employer]'s  n/a  via  Telephone or Geologist, engineering  (Video is Caregility application) and verified that I am speaking with the correct person using two identifiers. Discussed confidentiality: Yes   I discussed the limitations of telemedicine and the availability of in person appointments.  Discussed there is a possibility of technology failure and discussed alternative modes of communication if that failure occurs.  I discussed that engaging in this telemedicine visit, they consent to the provision of behavioral healthcare and the services will be billed under their insurance.  Patient and/or legal guardian expressed understanding and consented to Telemedicine visit: Yes   Presenting Concerns: Patient and/or family reports the following symptoms/concerns: Depressed after receiving a text breakup from [name] ending over eleven year relationship; distress over breakup leading to relapse in substance use last night with much remorse, guilt and shame. Pt also fears having cesarean, as well as potential CPS involvement due to relapse. Pt agrees to use walk-in to establish care with [facility] this week.  Duration of problem: Ongoing; Severity of problem:  moderately severe  Patient and/or Family's Strengths/Protective Factors: Concrete supports in place  (healthy food, safe environments, etc.) and Sense of purpose  Goals Addressed: Patient will:  Reduce symptoms of: anxiety, depression, and stress   Increase knowledge and/or ability of: stress reduction   Demonstrate ability to: Increase motivation to adhere to plan of care  Progress towards Goals: Ongoing  Interventions: Interventions utilized:  Motivational Interviewing, Psychoeducation and/or Health Education, and Link to [employer] Standardized Assessments completed: Not Needed  Patient and/or Family Response: Patient agrees with treatment plan.    Assessment: Patient currently experiencing Bipolar affective disorder and Cannabis use disorder.   Patient may benefit from continued therapeutic interventions .  Plan: Follow up with behavioral health clinician on : One week Behavioral recommendations:  -Use [facility] Urgent Care OR walk-in hours to establish care as soon as childcare is secured -Continue taking prenatal vitamin daily -Continue prioritizing healthy sleep/rest and meals as much as able during this time Referral(s): Integrated Orthoptist (In Clinic) and [facility] (LME/Outside Clinic)  I discussed the assessment and treatment plan with the patient and/or parent/guardian. They were provided an opportunity to ask questions and all were answered. They agreed with the plan and demonstrated an understanding of the instructions.   They were advised to call back or seek an in-person evaluation if the symptoms worsen or if the condition fails to improve as anticipated.  [name], LCSW     10/01/2020   11:19 AM 09/27/2020    9:12 AM 08/27/2020   11:09 AM 08/13/2020    9:12 AM 07/02/2020   10:21 AM  Depression screen PHQ 2/9  Decreased Interest 0 0 0 0 0  Down, Depressed, Hopeless 0 0 0 0 0  PHQ - 2 Score 0 0 0 0 0  Altered sleeping 0 0 0 0 1  Tired, decreased energy 0 0 0 0 0  Change in appetite 0 0 0 0 1  Feeling  bad or failure about yourself  0 0 0 0 0  Trouble concentrating 0 0 0 0 0  Moving slowly or fidgety/restless 0 0 0 0 0  Suicidal thoughts 0 0 0 0 0  PHQ-9 Score 0 0 0 0 2  Difficult doing work/chores     Not difficult at all      10/01/2020   11:19 AM 09/27/2020    9:12 AM 08/27/2020   11:09 AM 08/13/2020    9:12 AM  GAD 7 : Generalized Anxiety Score  Nervous, Anxious, on Edge 0 0 0 0  Control/stop worrying 0 0 0 0  Worry too much - different things 0 0 0 0  Trouble relaxing 0 0 0 0  Restless 0 0 0 0  Easily annoyed or irritable 0 0 0 0  Afraid - awful might happen 0 0 0 0  Total GAD 7 Score 0 0 0 0

## 2022-01-01 ENCOUNTER — Ambulatory Visit (INDEPENDENT_AMBULATORY_CARE_PROVIDER_SITE_OTHER): Payer: Medicaid Other | Admitting: Clinical

## 2022-01-01 DIAGNOSIS — F319 Bipolar disorder, unspecified: Secondary | ICD-10-CM

## 2022-01-01 DIAGNOSIS — F1290 Cannabis use, unspecified, uncomplicated: Secondary | ICD-10-CM

## 2022-01-01 NOTE — Patient Instructions (Signed)
Center for [facility] at [facility] [place], [place] [address] [phone] (main office) [phone] [facility] office)  [facility]  [address] [place], [place] [address] [phone] or [phone] [facility] 24/7 FOR ANYONE [address], [place], [place] Fax: [phone] [email or web address] *Interpreters available *Accepts all insurance and uninsured for Urgent Care needs *Accepts Medicaid and uninsured for outpatient treatment (below)    ONLY FOR [facility]  Below:   Outpatient New Patient Assessment/Therapy Walk-ins:        Monday -Thursday 8am until slots are full.        Every Friday 1pm-4pm  (first come, first served)                   New Patient Psychiatry/Medication Management        Monday-Friday 8am-11am (first come, first served)              For all walk-ins we ask that you arrive by 7:15am, because patients will be seen in the order of arrival.

## 2022-01-02 ENCOUNTER — Ambulatory Visit: Payer: Medicaid Other

## 2022-01-02 ENCOUNTER — Ambulatory Visit: Payer: Medicaid Other | Attending: Maternal & Fetal Medicine

## 2022-01-02 NOTE — BH Specialist Note (Signed)
Integrated Behavioral Health via Telemedicine Visit  01/09/2022 [place] [id number]  Number of Integrated Behavioral Health Clinician visits: Additional Visit  Session Start time: 0400   Session End time: 0412  Total time in minutes: 12  Brief video follow up check in at 3 days postpartum; schedule next on 01/22/22.   [name], LCSW     01/08/2022    8:10 AM 10/19/2020   11:55 AM 10/08/2020    9:45 AM  Edinburgh Postnatal Depression Scale Screening Tool  I have been able to laugh and see the funny side of things. 0 0 0  I have looked forward with enjoyment to things. 0 0 0  I have blamed myself unnecessarily when things went wrong. '2 1 1  '$ I have been anxious or worried for no good reason. '1 1 1  '$ I have felt scared or panicky for no good reason. 0 1 1  Things have been getting on top of me. 0 1 1  I have been so unhappy that I have had difficulty sleeping. 0 0 1  I have felt sad or miserable. 1 0 1  I have been so unhappy that I have been crying. 1 1 0  The thought of harming myself has occurred to me. 0 0 0  Edinburgh Postnatal Depression Scale Total 5 5 6

## 2022-01-06 ENCOUNTER — Inpatient Hospital Stay (HOSPITAL_COMMUNITY)
Admission: AD | Admit: 2022-01-06 | Discharge: 2022-01-08 | DRG: 784 | Disposition: A | Discharge status: Home / Self Care | Payer: Medicaid Other | Attending: Obstetrics and Gynecology | Admitting: Obstetrics and Gynecology

## 2022-01-06 ENCOUNTER — Encounter (HOSPITAL_COMMUNITY): Payer: Self-pay | Admitting: Family Medicine

## 2022-01-06 ENCOUNTER — Inpatient Hospital Stay (HOSPITAL_COMMUNITY): Payer: Medicaid Other | Admitting: Anesthesiology

## 2022-01-06 ENCOUNTER — Encounter (HOSPITAL_COMMUNITY)
Admission: AD | Disposition: A | Discharge status: Home / Self Care | Payer: Self-pay | Source: Home / Self Care | Attending: Obstetrics and Gynecology

## 2022-01-06 ENCOUNTER — Other Ambulatory Visit: Payer: Self-pay

## 2022-01-06 DIAGNOSIS — O99344 Other mental disorders complicating childbirth: Secondary | ICD-10-CM | POA: Diagnosis present

## 2022-01-06 DIAGNOSIS — Z302 Encounter for sterilization: Secondary | ICD-10-CM

## 2022-01-06 DIAGNOSIS — O321XX1 Maternal care for breech presentation, fetus 1: Secondary | ICD-10-CM

## 2022-01-06 DIAGNOSIS — Z98891 History of uterine scar from previous surgery: Secondary | ICD-10-CM

## 2022-01-06 DIAGNOSIS — O328XX1 Maternal care for other malpresentation of fetus, fetus 1: Secondary | ICD-10-CM | POA: Diagnosis present

## 2022-01-06 DIAGNOSIS — O09523 Supervision of elderly multigravida, third trimester: Secondary | ICD-10-CM | POA: Diagnosis not present

## 2022-01-06 DIAGNOSIS — F1290 Cannabis use, unspecified, uncomplicated: Secondary | ICD-10-CM | POA: Diagnosis present

## 2022-01-06 DIAGNOSIS — F1490 Cocaine use, unspecified, uncomplicated: Secondary | ICD-10-CM | POA: Diagnosis present

## 2022-01-06 DIAGNOSIS — O42013 Preterm premature rupture of membranes, onset of labor within 24 hours of rupture, third trimester: Secondary | ICD-10-CM | POA: Diagnosis not present

## 2022-01-06 DIAGNOSIS — O99324 Drug use complicating childbirth: Secondary | ICD-10-CM | POA: Diagnosis present

## 2022-01-06 DIAGNOSIS — O30049 Twin pregnancy, dichorionic/diamniotic, unspecified trimester: Secondary | ICD-10-CM | POA: Diagnosis present

## 2022-01-06 DIAGNOSIS — O30003 Twin pregnancy, unspecified number of placenta and unspecified number of amniotic sacs, third trimester: Secondary | ICD-10-CM

## 2022-01-06 DIAGNOSIS — O30043 Twin pregnancy, dichorionic/diamniotic, third trimester: Secondary | ICD-10-CM | POA: Diagnosis present

## 2022-01-06 DIAGNOSIS — F17210 Nicotine dependence, cigarettes, uncomplicated: Secondary | ICD-10-CM | POA: Diagnosis present

## 2022-01-06 DIAGNOSIS — Z3A33 33 weeks gestation of pregnancy: Secondary | ICD-10-CM | POA: Diagnosis not present

## 2022-01-06 DIAGNOSIS — F419 Anxiety disorder, unspecified: Secondary | ICD-10-CM | POA: Diagnosis present

## 2022-01-06 DIAGNOSIS — Z720 Tobacco use: Secondary | ICD-10-CM | POA: Diagnosis present

## 2022-01-06 DIAGNOSIS — O09892 Supervision of other high risk pregnancies, second trimester: Secondary | ICD-10-CM

## 2022-01-06 DIAGNOSIS — O0990 Supervision of high risk pregnancy, unspecified, unspecified trimester: Secondary | ICD-10-CM

## 2022-01-06 DIAGNOSIS — O99330 Smoking (tobacco) complicating pregnancy, unspecified trimester: Secondary | ICD-10-CM | POA: Diagnosis present

## 2022-01-06 DIAGNOSIS — O09529 Supervision of elderly multigravida, unspecified trimester: Secondary | ICD-10-CM

## 2022-01-06 DIAGNOSIS — O99334 Smoking (tobacco) complicating childbirth: Secondary | ICD-10-CM | POA: Diagnosis present

## 2022-01-06 DIAGNOSIS — F1910 Other psychoactive substance abuse, uncomplicated: Secondary | ICD-10-CM | POA: Diagnosis present

## 2022-01-06 DIAGNOSIS — F3164 Bipolar disorder, current episode mixed, severe, with psychotic features: Secondary | ICD-10-CM | POA: Diagnosis present

## 2022-01-06 HISTORY — DX: History of uterine scar from previous surgery: Z98.891

## 2022-01-06 LAB — RAPID URINE DRUG SCREEN, HOSP PERFORMED
Amphetamines: NOT DETECTED
Barbiturates: NOT DETECTED
Benzodiazepines: POSITIVE — AB
Cocaine: POSITIVE — AB
Opiates: NOT DETECTED
Tetrahydrocannabinol: POSITIVE — AB

## 2022-01-06 LAB — URINALYSIS, ROUTINE W REFLEX MICROSCOPIC
Bilirubin Urine: NEGATIVE
Glucose, UA: NEGATIVE mg/dL
Hgb urine dipstick: NEGATIVE
Ketones, ur: NEGATIVE mg/dL
Leukocytes,Ua: NEGATIVE
Nitrite: NEGATIVE
Protein, ur: NEGATIVE mg/dL
Specific Gravity, Urine: 1.012 (ref 1.005–1.030)
pH: 7 (ref 5.0–8.0)

## 2022-01-06 LAB — CBC
HCT: 36.4 % (ref 36.0–46.0)
Hemoglobin: 12.8 g/dL (ref 12.0–15.0)
MCH: 33.4 pg (ref 26.0–34.0)
MCHC: 35.2 g/dL (ref 30.0–36.0)
MCV: 95 fL (ref 80.0–100.0)
Platelets: 228 10*3/uL (ref 150–400)
RBC: 3.83 MIL/uL — ABNORMAL LOW (ref 3.87–5.11)
RDW: 13 % (ref 11.5–15.5)
WBC: 12 10*3/uL — ABNORMAL HIGH (ref 4.0–10.5)
nRBC: 0.2 % (ref 0.0–0.2)

## 2022-01-06 LAB — RPR: RPR Ser Ql: NONREACTIVE

## 2022-01-06 LAB — TYPE AND SCREEN
ABO/RH(D): A POS
Antibody Screen: NEGATIVE

## 2022-01-06 LAB — WET PREP, GENITAL
Clue Cells Wet Prep HPF POC: NONE SEEN
Sperm: NONE SEEN
Trich, Wet Prep: NONE SEEN
WBC, Wet Prep HPF POC: >=10 — AB (ref <10)
Yeast Wet Prep HPF POC: NONE SEEN

## 2022-01-06 SURGERY — Surgical Case
Anesthesia: Regional

## 2022-01-06 SURGERY — Surgical Case
Anesthesia: Spinal

## 2022-01-06 MED ORDER — ACETAMINOPHEN 500 MG PO TABS
1000.0000 mg | ORAL_TABLET | Freq: Four times a day (QID) | ORAL | Status: DC
  Administered 2022-01-06 – 2022-01-08 (×6): 1000 mg via ORAL
  Filled 2022-01-06 (×7): qty 2

## 2022-01-06 MED ORDER — SCOPOLAMINE 1 MG/3DAYS TD PT72
MEDICATED_PATCH | TRANSDERMAL | Status: AC
  Filled 2022-01-06: qty 1

## 2022-01-06 MED ORDER — ZOLPIDEM TARTRATE 5 MG PO TABS: 5.0000 mg | ORAL_TABLET | Freq: Every evening | ORAL | Status: DC | PRN

## 2022-01-06 MED ORDER — PHENYLEPHRINE 80 MCG/ML (10ML) SYRINGE FOR IV PUSH (FOR BLOOD PRESSURE SUPPORT)
PREFILLED_SYRINGE | INTRAVENOUS | Status: AC
  Filled 2022-01-06: qty 20

## 2022-01-06 MED ORDER — ENOXAPARIN SODIUM 60 MG/0.6ML IJ SOSY: 50.0000 mg | PREFILLED_SYRINGE | INTRAMUSCULAR | Status: DC

## 2022-01-06 MED ORDER — PHENYLEPHRINE 80 MCG/ML (10ML) SYRINGE FOR IV PUSH (FOR BLOOD PRESSURE SUPPORT)
PREFILLED_SYRINGE | INTRAVENOUS | Status: AC
  Filled 2022-01-06: qty 10

## 2022-01-06 MED ORDER — COCONUT OIL OIL: 1.0000 | TOPICAL_OIL | Status: DC | PRN

## 2022-01-06 MED ORDER — MEDROXYPROGESTERONE ACETATE 150 MG/ML IM SUSP: 150.0000 mg | INTRAMUSCULAR | Status: DC | PRN

## 2022-01-06 MED ORDER — DEXAMETHASONE SODIUM PHOSPHATE 10 MG/ML IJ SOLN
INTRAMUSCULAR | Status: DC | PRN
  Administered 2022-01-06: 8 mg via INTRAVENOUS

## 2022-01-06 MED ORDER — SCOPOLAMINE 1 MG/3DAYS TD PT72
1.0000 | MEDICATED_PATCH | Freq: Once | TRANSDERMAL | Status: DC
  Administered 2022-01-06: 1.5 mg via TRANSDERMAL

## 2022-01-06 MED ORDER — FENTANYL CITRATE (PF) 100 MCG/2ML IJ SOLN
25.0000 ug | INTRAMUSCULAR | Status: DC | PRN
  Administered 2022-01-06: 50 ug via INTRAVENOUS

## 2022-01-06 MED ORDER — SODIUM CHLORIDE 0.9% FLUSH: 3.0000 mL | INTRAVENOUS | Status: DC | PRN

## 2022-01-06 MED ORDER — SIMETHICONE 80 MG PO CHEW
80.0000 mg | CHEWABLE_TABLET | Freq: Three times a day (TID) | ORAL | Status: DC
  Administered 2022-01-07 – 2022-01-08 (×4): 80 mg via ORAL
  Filled 2022-01-06 (×4): qty 1

## 2022-01-06 MED ORDER — DIPHENHYDRAMINE HCL 25 MG PO CAPS
25.0000 mg | ORAL_CAPSULE | ORAL | Status: DC | PRN
  Filled 2022-01-06 (×2): qty 1

## 2022-01-06 MED ORDER — SODIUM CHLORIDE 0.9 % IV SOLN
500.0000 mg | INTRAVENOUS | Status: AC
  Administered 2022-01-06: 500 mg via INTRAVENOUS

## 2022-01-06 MED ORDER — SOD CITRATE-CITRIC ACID 500-334 MG/5ML PO SOLN
30.0000 mL | ORAL | Status: AC
  Administered 2022-01-06: 30 mL via ORAL
  Filled 2022-01-06: qty 30

## 2022-01-06 MED ORDER — ORAL CARE MOUTH RINSE: 15.0000 mL | Freq: Once | OROMUCOSAL | Status: DC

## 2022-01-06 MED ORDER — SODIUM CHLORIDE 0.9 % IR SOLN
Status: DC | PRN
  Administered 2022-01-06: 1

## 2022-01-06 MED ORDER — KETOROLAC TROMETHAMINE 30 MG/ML IJ SOLN
30.0000 mg | Freq: Four times a day (QID) | INTRAMUSCULAR | Status: AC
  Administered 2022-01-06 – 2022-01-07 (×4): 30 mg via INTRAVENOUS
  Filled 2022-01-06 (×4): qty 1

## 2022-01-06 MED ORDER — PROMETHAZINE HCL 25 MG/ML IJ SOLN: 6.2500 mg | INTRAMUSCULAR | Status: DC | PRN

## 2022-01-06 MED ORDER — FAMOTIDINE 20 MG PO TABS: 20.0000 mg | ORAL_TABLET | Freq: Once | ORAL | Status: DC

## 2022-01-06 MED ORDER — SENNOSIDES-DOCUSATE SODIUM 8.6-50 MG PO TABS
2.0000 | ORAL_TABLET | Freq: Every day | ORAL | Status: DC
  Administered 2022-01-07 – 2022-01-08 (×2): 2 via ORAL
  Filled 2022-01-06 (×2): qty 2

## 2022-01-06 MED ORDER — FENTANYL CITRATE (PF) 100 MCG/2ML IJ SOLN
INTRAMUSCULAR | Status: AC
  Filled 2022-01-06: qty 2

## 2022-01-06 MED ORDER — IBUPROFEN 600 MG PO TABS: 600.0000 mg | ORAL_TABLET | Freq: Four times a day (QID) | ORAL | Status: DC

## 2022-01-06 MED ORDER — OXYTOCIN-SODIUM CHLORIDE 30-0.9 UT/500ML-% IV SOLN
INTRAVENOUS | Status: AC
  Filled 2022-01-06: qty 500

## 2022-01-06 MED ORDER — ACETAMINOPHEN 10 MG/ML IV SOLN
INTRAVENOUS | Status: DC | PRN
  Administered 2022-01-06: 1000 mg via INTRAVENOUS

## 2022-01-06 MED ORDER — SIMETHICONE 80 MG PO CHEW: 80.0000 mg | CHEWABLE_TABLET | ORAL | Status: DC | PRN

## 2022-01-06 MED ORDER — DEXMEDETOMIDINE HCL IN NACL 80 MCG/20ML IV SOLN
INTRAVENOUS | Status: AC
  Filled 2022-01-06: qty 20

## 2022-01-06 MED ORDER — LACTATED RINGERS IV BOLUS
1000.0000 mL | Freq: Once | INTRAVENOUS | Status: AC
  Administered 2022-01-06: 1000 mL via INTRAVENOUS

## 2022-01-06 MED ORDER — DIPHENHYDRAMINE HCL 50 MG/ML IJ SOLN
12.5000 mg | INTRAMUSCULAR | Status: DC | PRN
  Administered 2022-01-06 (×2): 12.5 mg via INTRAVENOUS
  Filled 2022-01-06: qty 1

## 2022-01-06 MED ORDER — NALOXONE HCL 4 MG/10ML IJ SOLN: 1.0000 ug/kg/h | INTRAVENOUS | Status: DC | PRN

## 2022-01-06 MED ORDER — MIDAZOLAM HCL 5 MG/5ML IJ SOLN
INTRAMUSCULAR | Status: DC | PRN
  Administered 2022-01-06 (×2): .5 mg via INTRAVENOUS
  Administered 2022-01-06: 1 mg via INTRAVENOUS

## 2022-01-06 MED ORDER — ACETAMINOPHEN 160 MG/5ML PO SOLN: 960.0000 mg | Freq: Once | ORAL | Status: DC

## 2022-01-06 MED ORDER — BETAMETHASONE SOD PHOS & ACET 6 (3-3) MG/ML IJ SUSP
12.0000 mg | INTRAMUSCULAR | Status: DC
  Administered 2022-01-06: 12 mg via INTRAMUSCULAR
  Filled 2022-01-06: qty 5

## 2022-01-06 MED ORDER — DIBUCAINE (PERIANAL) 1 % EX OINT: 1.0000 | TOPICAL_OINTMENT | CUTANEOUS | Status: DC | PRN

## 2022-01-06 MED ORDER — OXYCODONE HCL 5 MG PO TABS: 5.0000 mg | ORAL_TABLET | Freq: Once | ORAL | Status: DC | PRN

## 2022-01-06 MED ORDER — CHLOROPROCAINE HCL (PF) 3 % IJ SOLN
INTRAMUSCULAR | Status: AC
  Filled 2022-01-06: qty 20

## 2022-01-06 MED ORDER — OXYTOCIN-SODIUM CHLORIDE 30-0.9 UT/500ML-% IV SOLN
INTRAVENOUS | Status: DC | PRN
  Administered 2022-01-06: 500 mL via INTRAVENOUS
  Administered 2022-01-06: 41.7 mL/h via INTRAVENOUS

## 2022-01-06 MED ORDER — TERBUTALINE SULFATE 1 MG/ML IJ SOLN
0.2500 mg | Freq: Once | INTRAMUSCULAR | Status: AC
  Administered 2022-01-06: 0.25 mg via SUBCUTANEOUS
  Filled 2022-01-06: qty 1

## 2022-01-06 MED ORDER — SCOPOLAMINE 1 MG/3DAYS TD PT72: 1.0000 | MEDICATED_PATCH | Freq: Once | TRANSDERMAL | Status: DC

## 2022-01-06 MED ORDER — TETANUS-DIPHTH-ACELL PERTUSSIS 5-2.5-18.5 LF-MCG/0.5 IM SUSY: 0.5000 mL | PREFILLED_SYRINGE | Freq: Once | INTRAMUSCULAR | Status: DC

## 2022-01-06 MED ORDER — LACTATED RINGERS IV SOLN: INTRAVENOUS | Status: DC

## 2022-01-06 MED ORDER — KETOROLAC TROMETHAMINE 30 MG/ML IJ SOLN: 30.0000 mg | Freq: Once | INTRAMUSCULAR | Status: DC

## 2022-01-06 MED ORDER — CEFAZOLIN SODIUM-DEXTROSE 2-4 GM/100ML-% IV SOLN
2.0000 g | INTRAVENOUS | Status: AC
  Administered 2022-01-06 (×2): 2 g via INTRAVENOUS
  Filled 2022-01-06: qty 100

## 2022-01-06 MED ORDER — WITCH HAZEL-GLYCERIN EX PADS: 1.0000 | MEDICATED_PAD | CUTANEOUS | Status: DC | PRN

## 2022-01-06 MED ORDER — PHENYLEPHRINE HCL-NACL 20-0.9 MG/250ML-% IV SOLN
INTRAVENOUS | Status: AC
  Filled 2022-01-06: qty 250

## 2022-01-06 MED ORDER — DIPHENHYDRAMINE HCL 25 MG PO CAPS
25.0000 mg | ORAL_CAPSULE | Freq: Four times a day (QID) | ORAL | Status: DC | PRN
  Administered 2022-01-07 (×3): 25 mg via ORAL
  Filled 2022-01-06: qty 1

## 2022-01-06 MED ORDER — PRENATAL MULTIVITAMIN CH
1.0000 | ORAL_TABLET | Freq: Every day | ORAL | Status: DC
  Administered 2022-01-07 – 2022-01-08 (×2): 1 via ORAL
  Filled 2022-01-06 (×2): qty 1

## 2022-01-06 MED ORDER — DEXAMETHASONE SODIUM PHOSPHATE 4 MG/ML IJ SOLN
INTRAMUSCULAR | Status: AC
  Filled 2022-01-06: qty 2

## 2022-01-06 MED ORDER — MORPHINE SULFATE (PF) 0.5 MG/ML IJ SOLN
INTRAMUSCULAR | Status: AC
  Filled 2022-01-06: qty 10

## 2022-01-06 MED ORDER — METOCLOPRAMIDE HCL 5 MG/ML IJ SOLN
INTRAMUSCULAR | Status: AC
  Filled 2022-01-06: qty 2

## 2022-01-06 MED ORDER — PHENYLEPHRINE HCL (PRESSORS) 10 MG/ML IV SOLN
INTRAVENOUS | Status: DC | PRN
  Administered 2022-01-06: 160 ug via INTRAVENOUS

## 2022-01-06 MED ORDER — GABAPENTIN 100 MG PO CAPS
200.0000 mg | ORAL_CAPSULE | Freq: Every day | ORAL | Status: DC
  Administered 2022-01-06 – 2022-01-07 (×2): 200 mg via ORAL
  Filled 2022-01-06 (×2): qty 2

## 2022-01-06 MED ORDER — CHLORHEXIDINE GLUCONATE 0.12 % MT SOLN: 15.0000 mL | Freq: Once | OROMUCOSAL | Status: DC

## 2022-01-06 MED ORDER — MENTHOL 3 MG MT LOZG: 1.0000 | LOZENGE | OROMUCOSAL | Status: DC | PRN

## 2022-01-06 MED ORDER — TRANEXAMIC ACID-NACL 1000-0.7 MG/100ML-% IV SOLN
INTRAVENOUS | Status: AC
  Filled 2022-01-06: qty 100

## 2022-01-06 MED ORDER — FENTANYL CITRATE (PF) 100 MCG/2ML IJ SOLN
INTRAMUSCULAR | Status: DC | PRN
  Administered 2022-01-06 (×2): 35 ug via INTRATHECAL
  Administered 2022-01-06: 15 ug via INTRATHECAL

## 2022-01-06 MED ORDER — MORPHINE SULFATE (PF) 0.5 MG/ML IJ SOLN
INTRAMUSCULAR | Status: DC | PRN
  Administered 2022-01-06: 150 ug via INTRATHECAL

## 2022-01-06 MED ORDER — BUPIVACAINE IN DEXTROSE 0.75-8.25 % IT SOLN
INTRATHECAL | Status: DC | PRN
  Administered 2022-01-06: 1.6 mL via INTRATHECAL

## 2022-01-06 MED ORDER — TRANEXAMIC ACID-NACL 1000-0.7 MG/100ML-% IV SOLN
INTRAVENOUS | Status: DC | PRN
  Administered 2022-01-06: 1000 mg via INTRAVENOUS

## 2022-01-06 MED ORDER — PHENYLEPHRINE HCL-NACL 20-0.9 MG/250ML-% IV SOLN
INTRAVENOUS | Status: DC | PRN
  Administered 2022-01-06: 60 ug/min via INTRAVENOUS

## 2022-01-06 MED ORDER — CEFAZOLIN SODIUM-DEXTROSE 2-4 GM/100ML-% IV SOLN
INTRAVENOUS | Status: AC
  Filled 2022-01-06: qty 100

## 2022-01-06 MED ORDER — ONDANSETRON HCL 4 MG/2ML IJ SOLN: 4.0000 mg | Freq: Three times a day (TID) | INTRAMUSCULAR | Status: DC | PRN

## 2022-01-06 MED ORDER — NALOXONE HCL 0.4 MG/ML IJ SOLN: 0.4000 mg | INTRAMUSCULAR | Status: DC | PRN

## 2022-01-06 MED ORDER — CHLOROPROCAINE HCL (PF) 3 % IJ SOLN
INTRAMUSCULAR | Status: DC | PRN
  Administered 2022-01-06: 20 mL

## 2022-01-06 MED ORDER — DEXMEDETOMIDINE HCL IN NACL 80 MCG/20ML IV SOLN
INTRAVENOUS | Status: DC | PRN
  Administered 2022-01-06 (×3): 4 ug via INTRAVENOUS
  Administered 2022-01-06: 8 ug via INTRAVENOUS

## 2022-01-06 MED ORDER — HYDROXYZINE HCL 50 MG PO TABS
25.0000 mg | ORAL_TABLET | ORAL | Status: DC | PRN
  Administered 2022-01-07: 25 mg via ORAL
  Filled 2022-01-06 (×2): qty 1

## 2022-01-06 MED ORDER — MEPERIDINE HCL 25 MG/ML IJ SOLN: 6.2500 mg | INTRAMUSCULAR | Status: DC | PRN

## 2022-01-06 MED ORDER — OXYTOCIN-SODIUM CHLORIDE 30-0.9 UT/500ML-% IV SOLN
2.5000 [IU]/h | INTRAVENOUS | Status: AC
  Administered 2022-01-06: 2.5 [IU]/h via INTRAVENOUS

## 2022-01-06 MED ORDER — ACETAMINOPHEN 500 MG PO TABS: 1000.0000 mg | ORAL_TABLET | Freq: Once | ORAL | Status: DC

## 2022-01-06 MED ORDER — ACETAMINOPHEN 10 MG/ML IV SOLN
INTRAVENOUS | Status: AC
  Filled 2022-01-06: qty 100

## 2022-01-06 MED ORDER — MIDAZOLAM HCL 2 MG/2ML IJ SOLN
INTRAMUSCULAR | Status: AC
  Filled 2022-01-06: qty 2

## 2022-01-06 MED ORDER — ONDANSETRON HCL 4 MG/2ML IJ SOLN
INTRAMUSCULAR | Status: DC | PRN
  Administered 2022-01-06: 4 mg via INTRAVENOUS

## 2022-01-06 MED ORDER — SOD CITRATE-CITRIC ACID 500-334 MG/5ML PO SOLN: 30.0000 mL | Freq: Once | ORAL | Status: DC

## 2022-01-06 MED ORDER — LACTATED RINGERS IV SOLN: INTRAVENOUS | Status: DC | PRN

## 2022-01-06 MED ORDER — OXYCODONE HCL 5 MG/5ML PO SOLN: 5.0000 mg | Freq: Once | ORAL | Status: DC | PRN

## 2022-01-06 MED ORDER — ONDANSETRON HCL 4 MG/2ML IJ SOLN
INTRAMUSCULAR | Status: AC
  Filled 2022-01-06: qty 2

## 2022-01-06 MED ORDER — OXYCODONE HCL 5 MG PO TABS
5.0000 mg | ORAL_TABLET | ORAL | Status: DC | PRN
  Administered 2022-01-07 – 2022-01-08 (×4): 10 mg via ORAL
  Filled 2022-01-06 (×4): qty 2

## 2022-01-06 SURGICAL SUPPLY — 38 items
BENZOIN TINCTURE PRP APPL 2/3 (GAUZE/BANDAGES/DRESSINGS) IMPLANT
CHLORAPREP W/TINT 26ML (MISCELLANEOUS) ×2 IMPLANT
CLAMP UMBILICAL CORD (MISCELLANEOUS) ×1 IMPLANT
CLOTH BEACON ORANGE TIMEOUT ST (SAFETY) ×1 IMPLANT
DRSG OPSITE POSTOP 4X10 (GAUZE/BANDAGES/DRESSINGS) ×1 IMPLANT
ELECT REM PT RETURN 9FT ADLT (ELECTROSURGICAL) ×1
ELECTRODE REM PT RTRN 9FT ADLT (ELECTROSURGICAL) ×1 IMPLANT
EXTRACTOR VACUUM BELL STYLE (SUCTIONS) IMPLANT
GAUZE SPONGE 4X4 12PLY STRL (GAUZE/BANDAGES/DRESSINGS) IMPLANT
GLOVE BIOGEL PI IND STRL 7.0 (GLOVE) ×2 IMPLANT
GLOVE ECLIPSE 7.0 STRL STRAW (GLOVE) ×1 IMPLANT
GOWN STRL REUS W/TWL LRG LVL3 (GOWN DISPOSABLE) ×2 IMPLANT
KIT ABG SYR 3ML LUER SLIP (SYRINGE) ×1 IMPLANT
NDL HYPO 25X5/8 SAFETYGLIDE (NEEDLE) ×1 IMPLANT
NEEDLE HYPO 25X5/8 SAFETYGLIDE (NEEDLE) ×1 IMPLANT
NS IRRIG 1000ML POUR BTL (IV SOLUTION) ×1 IMPLANT
PACK C SECTION WH (CUSTOM PROCEDURE TRAY) ×1 IMPLANT
PAD ABD DERMACEA PRESS 5X9 (GAUZE/BANDAGES/DRESSINGS) IMPLANT
PAD OB MATERNITY 4.3X12.25 (PERSONAL CARE ITEMS) ×1 IMPLANT
RTRCTR C-SECT PINK 25CM LRG (MISCELLANEOUS) ×1 IMPLANT
STRIP CLOSURE SKIN 1/2X4 (GAUZE/BANDAGES/DRESSINGS) IMPLANT
SUT MNCRL 0 VIOLET CTX 36 (SUTURE) ×2 IMPLANT
SUT MNCRL+ AB 3-0 CT1 36 (SUTURE) IMPLANT
SUT MON AB 2-0 CT1 36 (SUTURE) IMPLANT
SUT MONOCRYL 0 CTX 36 (SUTURE) ×2
SUT MONOCRYL AB 3-0 CT1 36IN (SUTURE) ×1
SUT PLAIN 0 NONE (SUTURE) IMPLANT
SUT PLAIN 2 0 (SUTURE)
SUT PLAIN 2 0 XLH (SUTURE) IMPLANT
SUT PLAIN ABS 2-0 CT1 27XMFL (SUTURE) IMPLANT
SUT VIC AB 0 CTX 36 (SUTURE) ×1
SUT VIC AB 0 CTX36XBRD ANBCTRL (SUTURE) ×1 IMPLANT
SUT VIC AB 2-0 CT1 27 (SUTURE)
SUT VIC AB 2-0 CT1 TAPERPNT 27 (SUTURE) IMPLANT
SUT VIC AB 4-0 KS 27 (SUTURE) ×1 IMPLANT
TOWEL OR 17X24 6PK STRL BLUE (TOWEL DISPOSABLE) ×1 IMPLANT
TRAY FOLEY W/BAG SLVR 14FR LF (SET/KITS/TRAYS/PACK) IMPLANT
WATER STERILE IRR 1000ML POUR (IV SOLUTION) ×1 IMPLANT

## 2022-01-06 NOTE — Transfer of Care (Signed)
Immediate Anesthesia Transfer of Care Note  Patient: [name] [name]  Procedure(s) Performed: [place] WITH TUBAL  Patient Location: PACU  Anesthesia Type:Spinal  Level of Consciousness: awake, alert , and oriented  Airway & Oxygen Therapy: Patient Spontanous Breathing  Post-op Assessment: Report given to RN and Post -op Vital signs reviewed and stable  Post vital signs: Reviewed and stable  Last Vitals:  Vitals Value Taken Time  BP 128/73 01/06/22 1418  Temp    Pulse 65 01/06/22 1423  Resp 21 01/06/22 1423  SpO2 98 % 01/06/22 1423  Vitals shown include unvalidated device data.  Last Pain:  Vitals:   01/06/22 1051  TempSrc: Oral         Complications: No notable events documented.

## 2022-01-06 NOTE — Op Note (Addendum)
Operative Note   Patient: [name] [name]  Date of Procedure: 01/06/2022  Procedure: Primary Low Transverse Cesarean with right sided tubal ligation  Indications: multiple gestation: twins dichorionic/diamniotic, preterm labor, breech Twin A  Pre-operative Diagnosis: Twins Baby A Breech Bilateral Tubal Ligation.   Post-operative Diagnosis: Same and Bilateral Tubal Sterilization via R sided salpingectomy  Surgeon: Surgeon(s) and Role:    * Clarnce Flock, MD - Primary    * Radene Gunning - Assisting    * Mieke Brinley, Lyndel Safe, MD - Assisting  Assistants: Dr Liliane Channel  An experienced assistant was required given the standard of surgical care given the complexity of the case.  This assistant was needed for exposure, dissection, suctioning, retraction, instrument exchange, assisting with delivery with administration of fundal pressure, and for overall help during the procedure.   Anesthesia: spinal  Anesthesiologist: No responsible provider has been recorded for the case.   Antibiotics: Cefazolin and Azithromycin   Estimated Blood Loss: 1522 ml   Total IV Fluids: 3250 ml  Urine Output:  957m  Specimens: Placenta, Right fallopian tube   Complications: no complications   Indications: [name] [name] a 36y.o. G2P1001 with an IUP 328w5dresenting for unscheduled, urgent cesarean secondary to the indications listed above. Clinical course notable for dichorionic/diamniotic twin gestation, polysubstance use, preterm labor.  The risks of cesarean section were discussed with the patient including but were not limited to: bleeding which may require transfusion or reoperation; infection which may require antibiotics; injury to bowel, bladder, ureters or other surrounding organs; injury to the fetus; need for additional procedures including hysterectomy in the event of a life-threatening hemorrhage; placental abnormalities wth subsequent pregnancies,  incisional problems, thromboembolic phenomenon and other postoperative/anesthesia complications.  Patient also desires permanent sterilization.  Other reversible forms of contraception were discussed with patient; she declines all other modalities. Risks of procedure discussed with patient including but not limited to: risk of regret, permanence of method, bleeding, infection, injury to surrounding organs and need for additional procedures.  Failure risk of about 1% with increased risk of ectopic gestation if pregnancy occurs was also discussed with patient.  Also discussed possibility of post-tubal pain syndrome. The patient concurred with the proposed plan, giving informed written consent for the procedures.  Patient NPO status waived given urgency of case. Anesthesia and OR aware.  Preoperative prophylactic antibiotics and SCDs ordered on call to the OR.   Findings: Twin A Viable infant in breech presentation (footling), no nuchal cord present. Twin B - viable infant delivered footling breech.  Apgars 6, 8 for Twin A,  Apgars 7, 9 for Tw[address]oShenice, Dolder0[[id number]]     GYKZL DJTTSVX, BLTJ QZESP0[[id number]] ,    [name] [name], Rotert0[[id number]]     BWLSL HTDSKAJ, GOTL XBWIO0[[id number]] ,    [name] [name], Puccio0[[id number]]     EHOZY YQMGNOI, BBCWaMontrose0[[id number]] . Weight    [name] [name], [name] [name], [name] [name][[id number]]1940 g . Clear amniotic fluid. Normal placenta, three vessel cord. Normal uterus, Normal right and Surgically absent left fallopian tubes, Normal bilateral ovaries.  Procedure Details:  A Time Out was held and the above information confirmed. The patient received intravenous antibiotics and had sequential compression devices applied to her lower extremities preoperatively. The patient was taken back to the operative suite where spinal anesthesia was  administered. After induction of  anesthesia, the patient was draped and prepped in the usual sterile manner and placed in a dorsal supine position with a leftward tilt. A low transverse skin incision was made with scalpel and carried down through the subcutaneous tissue to the fascia. Fascial incision was made and extended transversely. The fascia was separated from the underlying rectus tissue superiorly and inferiorly. The rectus muscles were separated in the midline bluntly and the peritoneum was entered bluntly. An Alexis retractor was placed to aid in visualization of the uterus. A bladder flap was not developed. A low transverse uterine incision was made. Twin A was successfully delivered from bilateral footling breech presentation, the umbilical cord was clamped after 1 minute. An Allis forceps was used to artificial rupture the amniotic sac for Twin B. Twin B was successfully delivered from bilateral footling breech presentation, the umbilical cord was clamped after 1 minute. Cord ph was not sent, for either infant. Cord blood for both infants was obtained for evaluation. The placenta was removed Intact and appeared normal. The uterine incision was closed with running, single layer unlocked sutures of 0-Monocryl. Overall, excellent hemostasis was noted.   Attention was then turned to the fallopian tubes. The left fallopian tube was confirmed to be surgically absent, consistent with patient's past medical history. A Kelly clamp was placed across the right fallopian tube taking care to incorporate the fimbriae. A second clamp was then placed below the first. The fallopian tube was then removed with Metzenbaum scissors. The pedicle was then ligated with 2-0 Monocryl suture and the second clamp was removed with excellent hemostasis noted. Then a second ligature of 2-0 Monocryl suture was placed below the remaining clamp, the clamp was then removed. Engorged blood vessels were noted in the mesosalpinx which appeared friable and were bleeding,  kelly's clamp reapplied and 2-0 monocryl suture was placed multiple times to achieve hemostasis. At this point, Dr Damita Dunnings was called in and scrubbed into the procedure. Running stitch a 3-0 monocryl was applied over the mesosalpinx. Following this good hemostasis was achieved.  The abdomen and the pelvis were cleared of all clot and debris and the Ubaldo Glassing was removed. Hemostasis was confirmed on all surfaces. The fascia was then closed using 0 Vicryl in a running fashion and the skin was closed with a 4-0 vicryl subcuticular stitch. The patient tolerated the procedure well. Sponge, lap, instrument and needle counts were correct x 2. She was taken to the recovery room in stable condition.    Disposition: PACU - hemodynamically stable.    Signed: Stormy Card, MD, MPH OB Fellow, [facility] [employer] farm manager)

## 2022-01-06 NOTE — Discharge Summary (Signed)
Postpartum Discharge Summary  Date of Service updated***     Patient Name: [name] [name] DOB: Apr 16, 1985 MRN: [id number]  Date of admission: 01/06/2022 Delivery date:   [address] Debi, Cousin [[id number]]  01/06/2022    Sunya, Humbarger [[id number]]  01/06/2022  Delivering provider:    Vanita Panda [[id number]]  [place] Breeze, MATTHEW [address] [place], [place] Rayelle [[id number]]  Clayton Lefort M  Date of discharge: 01/06/2022  Admitting diagnosis: Preterm labor [O60.00] Status post primary low transverse cesarean section [Z98.891] Intrauterine pregnancy: [redacted]w[redacted]d    Secondary diagnosis:  Principal Problem:   Status post primary low transverse cesarean section Active Problems:   Polysubstance abuse ([name])   Bipolar disorder, curr episode mixed, severe, with psychotic features ([place]   AMA (advanced maternal age) multigravida 35+   Tobacco use affecting pregnancy, antepartum   Dichorionic diamniotic twin gestation   Supervision of high risk pregnancy, antepartum   Short interval between pregnancies affecting pregnancy in second trimester, antepartum   Preterm labor  Additional problems: ***    Discharge diagnosis: {DX.:23714}                                              Post partum procedures:postpartum tubal ligation Augmentation: N/A Complications: {OB Labor/Delivery Complications:20784}  Hospital course: Sceduled C/S   36y.o. yo G2P1103 at 359w5das admitted to the hospital 01/06/2022 for scheduled cesarean section with the following indication:Multifetal Gestation and breech Twin A .Delivery details are as follows:  Membrane Rupture Time/Date:    WhJenniferann, Stuckert0[[id number]]      QZESP QZRAQTM, AUQJ FHLKT0[[id number]]12:56 PM ,   WhKortlynn, Poust0[[id number]]    WhSatcha, Storlie0[[id number]]01/06/2022   Delivery Method:   WhWandra, Babin0[[id number]]C-Section, Low Transverse     WhMehar, Sagen0[[id number]]C-Section, Low Transverse  Details of operation can be found in separate operative note.  Patient had a postpartum course complicated by***.  She is ambulating, tolerating a regular diet, passing flatus, and urinating well. Patient is discharged home in stable condition on  01/06/22        Newborn Data: Birth date:   [address], Jagoda0[[id number]]01/06/2022    WhKeiasha, Diep0[[id number]]01/06/2022  Birth time:   WhTequila, Rottmann0[[id number]]12:54 PM    [address]Elysa, Womac0[[id number]]1:00 PM  Gender:   WhJanell, Keeling0[[id number]]Female    [address]ynonia, Medero0[[id number]]Female  Living status:   WhShelaine, Frie0[[id number]]  QGBEEF    EOFHQoErendida, Wrenn0[[id number]]Living  Apgars:   Wh[address]oRaini, Tiley0[[id number]]6 [address]0[[id number]]  8   WhDejana, Pugsley0[[id number]]8 [address]oManaia, Samad0[[id number]]9  Weight:   WhTatia, Petrucci0[[id number]]1920 g    WhAleayah, Chico0[[id number]]1940 g     Magnesium Sulfate received: {Mag received:30440022} BMZ received: {BMZ received:30440023} Rhophylac:N/A MMR:N/A, Immune T-DaP:{Tdap:23962} Flu: {F{BFX:83291}ransfusion:{Transfusion received:30440034}  Physical exam  Vitals:   01/06/22 1534 01/06/22 1635 01/06/22 1725 01/06/22 1726  BP: 128/70  122/63  (!) 146/81  Pulse: 60 62  65  Resp:      Temp: 97.7 F (36.5 C)     TempSrc: Oral     SpO2: 97%  95%    General: {Exam; general:21111117} Lochia: {Desc; appropriate/inappropriate:30686::"appropriate"} Uterine Fundus: {Desc; firm/soft:30687} Incision: {Exam; incision:21111123} DVT Evaluation: {Exam; TSV:7793903} Labs: Lab Results  Component Value Date   WBC 12.0 (H) 01/06/2022   HGB 12.8 01/06/2022   HCT 36.4 01/06/2022   MCV 95.0 01/06/2022   PLT 228 01/06/2022       Latest Ref Rng & Units 09/22/2021    5:50 AM  CMP  Glucose 70 - 99 mg/dL 114   BUN 6 - 20 mg/dL <5   Creatinine 0.44 - 1.00 mg/dL 0.64   Sodium 135 - 145 mmol/L 138   Potassium 3.5 - 5.1 mmol/L 3.2   Chloride 98 - 111 mmol/L 107   CO2 22 - 32 mmol/L 21   Calcium 8.9 - 10.3 mg/dL 8.8   Total Protein 6.5 - 8.1 g/dL 5.8   Total Bilirubin 0.3 - 1.2 mg/dL 0.9   Alkaline Phos 38 - 126 U/L 51   AST 15 - 41 U/L 15   ALT 0 - 44 U/L 11    Edinburgh Score:    10/19/2020   11:55 AM  Edinburgh Postnatal Depression Scale Screening Tool  I have been able to laugh and see the funny side of things. 0  I have looked forward with enjoyment to things. 0  I have blamed myself unnecessarily when things went wrong. 1  I have been anxious or worried for no good reason. 1  I have felt scared or panicky for no good reason. 1  Things have been getting on top of me. 1  I have been so unhappy that I have had difficulty sleeping. 0  I have felt sad or miserable. 0  I have been so unhappy that I have been crying. 1  The thought of harming myself has occurred to me. 0  Edinburgh Postnatal Depression Scale Total 5     After visit meds:  Allergies as of 01/06/2022       Reactions   Ibuprofen Nausea And Vomiting   Stomach upset     Med Rec must be completed prior to using this [facility]***        Discharge home in stable condition Infant Feeding: {Baby feeding:23562} Infant Disposition:{CHL IP OB HOME WITH ESPQZR:00762} Discharge instruction: per After Visit Summary and Postpartum booklet. Activity: Advance as tolerated. Pelvic rest for 6 weeks.  Diet: {OB UQJF:35456256} Future Appointments: Future Appointments  Date Time Provider Diagonal  01/09/2022  3:45 PM [facility]  01/15/2022 10:35 AM Clarnce Flock, MD [facility]  01/31/2022 11:00 AM Pashayan, Redgie Grayer, MD [facility] None   Follow up Visit:  The following message was sent to  [facility] by Mikki Santee, MD  Please schedule this patient for a In person postpartum visit in 4 weeks with the following provider: MD. Additional Postpartum F/U:Postpartum Depression checkup and Incision check 1 week  High risk pregnancy complicated by:  Multifetal (twin) gestation, polysubstance use, bipolar d/o Delivery mode:     Teia, Freitas [[id number]]  C-Section, Low Transverse    Jameika, Kinn [[id number]]  C-Section, Low Transverse  Anticipated Birth Control:  BTL done [facility]   01/06/2022 Jeston Junkins Sherrilyn Rist, MD

## 2022-01-06 NOTE — H&P (Signed)
LABOR AND DELIVERY ADMISSION HISTORY AND PHYSICAL NOTE  [name] [name] is a 36 y.o. female G2P1001 with IUP at 57w5dpresenting for contractions.   She reports positive fetal movement. She denies leakage of fluid or vaginal bleeding.  She reports she has been having contractions for the past two days that are getting increasingly painful.    She plans on bottle feeding. She requests tubal ligation for birth control.  Per MAU provider discussion patient is having relationship problems, and used some cocaine recently.  [facility]/Com[facility]  Twin A Sono:  '@[redacted]w[redacted]d'$ , CWD, normal anatomy, breech presentation, anterior placenta, 12%ile, EFW 1681 g  Twin B Sono:  '@[redacted]w[redacted]d'$ , CWD, normal anatomy, cephalic presentation, posterior placenta, 13%ile, EFW 1699g  1% discordancy  Pregnancy complications:  - AMA - short interval pregnancy - di/di twins - anxiety/bipolar - unwanted fertility  Past Medical History: Past Medical History:  Diagnosis Date   Anxiety    Bipolar disorder ([place]    Boils    Headache(784.0)    PCOS (polycystic ovarian syndrome) 02/26/2012   Recurrent UTI 05/15/2013   Neg 03/2020  E. Coli   Smoker 01/18/2019    Past Surgical History: Past Surgical History:  Procedure Laterality Date   LAPAROSCOPIC UNILATERAL SALPINGECTOMY Left    paratubal cystectomy    Obstetrical History: OB History     Gravida  2   Para  1   Term  1   Preterm  0   AB  0   Living  1      SAB  0   IAB  0   Ectopic  0   Multiple  0   Live Births  1           Social History: Social History   Socioeconomic History   Marital status: Single    Spouse name: Not on file   Number of children: Not on file   Years of education: Not on file   Highest education level: Not on file  Occupational History   Not on file  Tobacco Use   Smoking status: Every Day    Packs/day: 0.25    Types: Cigarettes   Smokeless tobacco:  Never  Vaping Use   Vaping Use: Every day   Substances: Nicotine, THC  Substance and Sexual Activity   Alcohol use: Not Currently    Comment: rare   Drug use: Yes    Types: Marijuana, Cocaine    Comment: last used 01/03/2022   Sexual activity: Yes    Birth control/protection: None  Other Topics Concern   Not on file  Social History Narrative   Not on file   Social Determinants of Health   Financial Resource Strain: Not on file  Food Insecurity: Food Insecurity Present (10/01/2020)   Hunger Vital Sign    Worried About Running Out of Food in the Last Year: Sometimes true    Ran Out of Food in the Last Year: Never true  Transportation Needs: No Transportation Needs (10/01/2020)   PRAPARE - THydrologist(Medical): No    Lack of Transportation (Non-Medical): No  Physical Activity: Not on file  Stress: Not on file  Social Connections: Not on file    Family History: Family History  Problem Relation Age of Onset   Mental illness Mother    Thyroid disease Mother    Asthma Sister    Asthma Brother    Alcohol abuse Brother  Cancer Paternal Aunt    Breast cancer Paternal Aunt    Hypertension Maternal Grandmother    Birth defects Neg Hx    Diabetes Neg Hx    Heart disease Neg Hx    Stroke Neg Hx     Allergies: Allergies  Allergen Reactions   Ibuprofen Nausea And Vomiting    Stomach upset    Medications Prior to Admission  Medication Sig Dispense Refill Last Dose   acetaminophen (TYLENOL) 500 MG tablet Take 500 mg by mouth every 6 (six) hours as needed.   01/05/2022   cyclobenzaprine (FLEXERIL) 10 MG tablet Take 1 tablet (10 mg total) by mouth 3 (three) times daily as needed for muscle spasms. (Patient not taking: Reported on 12/26/2021) 30 tablet 2    ondansetron (ZOFRAN) 8 MG tablet Take 1 tablet (8 mg total) by mouth every 8 (eight) hours as needed for nausea or vomiting. (Patient not taking: Reported on 12/26/2021) 20 tablet 0    Prenatal  Vit-Fe Fumarate-FA (MULTIVITAMIN-PRENATAL) 27-0.8 MG TABS tablet Take 1 tablet by mouth daily at 12 noon.      promethazine (PHENERGAN) 25 MG tablet Take 1 tablet (25 mg total) by mouth every 6 (six) hours as needed for nausea or vomiting. (Patient not taking: Reported on 12/26/2021) 30 tablet 0      Review of Systems  All systems reviewed and negative except as stated in HPI  Physical Exam Blood pressure 136/86, pulse 95, temperature 97.8 F (36.6 C), temperature source Oral, resp. rate (!) 22, last menstrual period 05/15/2021, SpO2 99 %, not currently breastfeeding. General appearance: alert, oriented, very very anxious Lungs: normal respiratory effort Heart: regular rate Abdomen: soft, non-tender; gravid Extremities: No calf swelling or tenderness  FHR Twin A: baseline 125, moderate variability, +accels (10x10, no 15x15 seen), no decels, toco noisy. Cat I  FHR Twin B: baseline 125, moderate variability, +accels (15x15), no decels, toco noisy. Cat I  Bedside US Documentation Pt informed that the ultrasound is considered a limited OB ultrasound and is not intended to be a complete ultrasound exam.  Patient also informed that the ultrasound is not being completed with the intent of assessing for fetal or placental anomalies or any pelvic abnormalities.  Explained that the purpose of today's ultrasound is to assess for  presentation.  Patient acknowledges the purpose of the exam and the limitations of the study.    My read: twin A in breech presentation with extended lower extremity towards cervix. Twin B cephalic  Prenatal labs: ABO, Rh: --/--/A POS (11/06 1050) Antibody: NEG (11/06 1050) Rubella: 1.21 (05/24 1122) RPR: Non Reactive (09/21 0845)  HBsAg: Negative (05/24 1122)  HIV: Non Reactive (09/21 0845)  GC/Chlamydia:  Neisseria Gonorrhea  Date Value Ref Range Status  09/22/2021 Negative  Final   Chlamydia  Date Value Ref Range Status  09/22/2021 Negative  Final    GBS:     2-hr GTT: normal Genetic screening:  low risk Anatomy US: di di twins, concordant growth, no abnormal findings  Prenatal Transfer Tool  Maternal Diabetes: No Genetic Screening: Normal Maternal Ultrasounds/Referrals: Normal Fetal Ultrasounds or other Referrals:  Referred to Materal Fetal Medicine  Maternal Substance Abuse:  Yes:  Type: Marijuana, Cocaine Significant Maternal Medications:  None Significant Maternal Lab Results: Other: GBS unknown  Results for orders placed or performed during the hospital encounter of 01/06/22 (from the past 24 hour(s))  Type and screen [place]   Collection Time: 01/06/22 10:50 AM  Result Value  Ref Range   ABO/RH(D) A POS    Antibody Screen NEG    Sample Expiration      01/09/2022,2359 Performed at [facility], [place] [address] [place], [place] [address]   CBC   Collection Time: 01/06/22 10:51 AM  Result Value Ref Range   WBC 12.0 (H) 4.0 - 10.5 K/uL   RBC 3.83 (L) 3.87 - 5.11 MIL/uL   Hemoglobin 12.8 12.0 - 15.0 g/dL   HCT 36.4 36.0 - 46.0 %   MCV 95.0 80.0 - 100.0 fL   MCH 33.4 26.0 - 34.0 pg   MCHC 35.2 30.0 - 36.0 g/dL   RDW 13.0 11.5 - 15.5 %   Platelets 228 150 - 400 K/uL   nRBC 0.2 0.0 - 0.2 %  Wet prep, genital   Collection Time: 01/06/22 11:10 AM   Specimen: Vaginal  Result Value Ref Range   Yeast Wet Prep HPF POC NONE SEEN NONE SEEN   Trich, Wet Prep NONE SEEN NONE SEEN   Clue Cells Wet Prep HPF POC NONE SEEN NONE SEEN   WBC, Wet Prep HPF POC >=10 (A) <10   Sperm NONE SEEN     Patient Active Problem List   Diagnosis Date Noted   Preterm labor 01/06/2022   Short interval between pregnancies affecting pregnancy in second trimester, antepartum 09/10/2021   Dichorionic diamniotic twin gestation 07/24/2021   Supervision of high risk pregnancy, antepartum 07/24/2021   Labral tear of left hip joint 09/10/2020   Increased SMA carrier risk on [employer] testing 08/06/2020   Hip instability, left  07/02/2020   AMA (advanced maternal age) multigravida 35+ 03/23/2020   Tobacco use affecting pregnancy, antepartum 03/23/2020   Bipolar disorder, curr episode mixed, severe, with psychotic features ([place]) 08/02/2014   Polysubstance abuse ([place]) 05/12/2013    Assessment: [name] [name] is a 36 y.o. G2P1001 at 74w5dhere for scheduled CS.  #Primary CS for fetal malpresetation, di/di twin gestation:  The risks of cesarean section were discussed with the patient including but were not limited to: bleeding which may require transfusion or reoperation; infection which may require antibiotics; injury to bowel, bladder, ureters or other surrounding organs; injury to the fetus; need for additional procedures including hysterectomy in the event of a life-threatening hemorrhage; placental abnormalities wth subsequent pregnancies, incisional problems, thromboembolic phenomenon and other postoperative/anesthesia complications.  Patient also desires permanent sterilization.  Other reversible forms of contraception were discussed with patient; she declines all other modalities. Risks of procedure discussed with patient including but not limited to: risk of regret, permanence of method, bleeding, infection, injury to surrounding organs and need for additional procedures.  Failure risk of about 1% with increased risk of ectopic gestation if pregnancy occurs was also discussed with patient.  Also discussed possibility of post-tubal pain syndrome. The patient concurred with the proposed plan, giving informed written consent for the procedures.  Patient ate some tater tots about two hours ago, will likely need general anesthesia. Anesthesia and OR aware.  Preoperative prophylactic antibiotics and SCDs ordered on call to the OR.    #Anesthesia: likely general given recent PO intake, per anesthesiologist eval #FWB: FHR both Cat I #GBS/ID: unknown #MOF: Bottle #MOC: Salpingectomy, already s/p L salpingectomy.  Papers signed 12/25/2021 #Circ: TBD  #Recent cocaine use: UDS pending, SW consult PP  MAnnice NeedyEPreston Memorial Hospital11/08/2021, 12:00 PM

## 2022-01-06 NOTE — Anesthesia Preprocedure Evaluation (Addendum)
Anesthesia Evaluation  Patient identified by MRN, date of birth, ID band Patient awake    Reviewed: Allergy & Precautions, NPO status , Patient's Chart, lab work & pertinent test results  History of Anesthesia Complications Negative for: history of anesthetic complications  Airway Mallampati: III  TM Distance: >3 FB Neck ROM: Full    Dental  (+) Dental Advisory Given   Pulmonary Current SmokerPatient did not abstain from smoking.   Pulmonary exam normal        Cardiovascular negative cardio ROS Normal cardiovascular exam     Neuro/Psych  Headaches PSYCHIATRIC DISORDERS Anxiety  Bipolar Disorder      GI/Hepatic ,GERD  Medicated,,(+)     substance abuse  cocaine use and marijuana use Last cocaine ~5 days ago Marijuana this morning    Endo/Other  negative endocrine ROS    Renal/GU negative Renal ROS     Musculoskeletal negative musculoskeletal ROS (+)    Abdominal   Peds  Hematology negative hematology ROS (+)   Anesthesia Other Findings   Reproductive/Obstetrics (+) Pregnancy  PCOS                              Anesthesia Physical Anesthesia Plan  ASA: 3 and emergent  Anesthesia Plan: Spinal   Post-op Pain Management: Tylenol PO (pre-op)*   Induction:   PONV Risk Score and Plan: 1 and Treatment may vary due to age or medical condition, Ondansetron and Scopolamine patch - Pre-op  Airway Management Planned: Natural Airway  Additional Equipment: None  Intra-op Plan:   Post-operative Plan:   Informed Consent: I have reviewed the patients History and Physical, chart, labs and discussed the procedure including the risks, benefits and alternatives for the proposed anesthesia with the patient or authorized representative who has indicated his/her understanding and acceptance.       Plan Discussed with: CRNA, Anesthesiologist and Surgeon  Anesthesia Plan Comments: (Labs  reviewed, platelets acceptable. Discussed risks and benefits of spinal, including spinal/epidural hematoma, infection, failed block, and PDPH. Discussed with surgeon about urgency of procedure in setting of patient with severe anxiety and greasy meal 2 hours, states that we cannot wait safely for NPO. Explained risks of aspiration to patient if needing to perform general anesthesia in event of difficulty with spinal or patient being intolerable to being awake for C-section given her extreme anxiety. Patient expressed understanding and wished to proceed. )       Anesthesia Quick Evaluation

## 2022-01-06 NOTE — MAU Provider Note (Signed)
History     CSN: [id number]  Arrival date and time: 01/06/22 1035   None     No chief complaint on file.  HPI This is a 36 yo G2P1001 at 79w5dwith a pregnancy complicated by DI/DI twins with EFW of 12-13% and history of substance use, who presents with pelvic pain and contractions. Contractions started earlier today. No leaking fluid. She hasn't felt the babies move a lot today.  OB History     Gravida  2   Para  1   Term  1   Preterm  0   AB  0   Living  1      SAB  0   IAB  0   Ectopic  0   Multiple  0   Live Births  1           Past Medical History:  Diagnosis Date   Anxiety    [place]    Boils    Headache(784.0)    PCOS (polycystic ovarian syndrome) 02/26/2012   Recurrent UTI 05/15/2013   Neg 03/2020  E. Coli   Smoker 01/18/2019    Past Surgical History:  Procedure Laterality Date   LAPAROSCOPIC UNILATERAL SALPINGECTOMY Left    paratubal cystectomy    Family History  Problem Relation Age of Onset   Mental illness Mother    Thyroid disease Mother    Asthma Sister    Asthma Brother    Alcohol abuse Brother    Cancer Paternal Aunt    Breast cancer Paternal Aunt    Hypertension Maternal Grandmother    Birth defects Neg Hx    Diabetes Neg Hx    Heart disease Neg Hx    Stroke Neg Hx     Social History   Tobacco Use   Smoking status: Every Day    Packs/day: 0.25    Types: Cigarettes   Smokeless tobacco: Never  Vaping Use   Vaping Use: Every day   Substances: Nicotine, THC  Substance Use Topics   Alcohol use: Not Currently    Comment: rare   Drug use: Yes    Types: Marijuana, Cocaine    Comment: last used 01/03/2022    Allergies:  Allergies  Allergen Reactions   Ibuprofen Nausea And Vomiting    Stomach upset    Medications Prior to Admission  Medication Sig Dispense Refill Last Dose   acetaminophen (TYLENOL) 500 MG tablet Take 500 mg by mouth every 6 (six) hours as needed.   01/05/2022    cyclobenzaprine (FLEXERIL) 10 MG tablet Take 1 tablet (10 mg total) by mouth 3 (three) times daily as needed for muscle spasms. (Patient not taking: Reported on 12/26/2021) 30 tablet 2    ondansetron (ZOFRAN) 8 MG tablet Take 1 tablet (8 mg total) by mouth every 8 (eight) hours as needed for nausea or vomiting. (Patient not taking: Reported on 12/26/2021) 20 tablet 0    Prenatal Vit-Fe Fumarate-FA (MULTIVITAMIN-PRENATAL) 27-0.8 MG TABS tablet Take 1 tablet by mouth daily at 12 noon.      promethazine (PHENERGAN) 25 MG tablet Take 1 tablet (25 mg total) by mouth every 6 (six) hours as needed for nausea or vomiting. (Patient not taking: Reported on 12/26/2021) 30 tablet 0     Review of Systems Physical Exam   Blood pressure 136/86, pulse 95, temperature 97.8 F (36.6 C), temperature source Oral, resp. rate (!) 22, last menstrual period 05/15/2021, SpO2 99 %, not currently breastfeeding.  Physical Exam  Vitals reviewed.  Constitutional:      Appearance: Normal appearance.  HENT:     Head: Normocephalic and atraumatic.  Pulmonary:     Effort: Pulmonary effort is normal.  Skin:    General: Skin is warm and dry.     Capillary Refill: Capillary refill takes less than 2 seconds.  Neurological:     General: No focal deficit present.     Mental Status: She is alert.  Psychiatric:        Mood and Affect: Mood normal.        Behavior: Behavior normal.        Thought Content: Thought content normal.        Judgment: Judgment normal.     MAU Course  Procedures NST A:  Baseline: 120  Variability: moderate Accelerations: present  Decelerations: none Contractions: every 3-4 minutes  NST:  Baseline: 125  Variability: moderate Accelerations: present  Decelerations: none  MDM Gave terbutaline, IVF bolus x1L, still contraction and changing cervix.   Baby A confirmed breech on exam and [place].  Assessment and Plan  Preterm labor Notified Dr Dione Plover - proceed with cesarean delivery. BMZ  '12mg'$  given Cultures obtained.  Truett Mainland 01/06/2022, 11:45 AM

## 2022-01-06 NOTE — Anesthesia Procedure Notes (Signed)
Spinal  Patient location during procedure: OR Start time: 01/06/2022 12:20 PM End time: 01/06/2022 12:23 PM Reason for block: surgical anesthesia Staffing Performed: anesthesiologist  Anesthesiologist: Audry Pili, MD Performed by: Audry Pili, MD Authorized by: Audry Pili, MD   Preanesthetic Checklist Completed: patient identified, IV checked, risks and benefits discussed, surgical consent, monitors and equipment checked, pre-op evaluation and timeout performed Spinal Block Patient position: sitting Prep: DuraPrep Patient monitoring: heart rate, cardiac monitor, continuous pulse ox and blood pressure Approach: midline Location: L3-4 Injection technique: single-shot Needle Needle type: Pencan  Needle gauge: 24 G Additional Notes Consent was obtained prior to the procedure with all questions answered and concerns addressed. Risks including, but not limited to, bleeding, infection, nerve damage, paralysis, failed block, inadequate analgesia, allergic reaction, high spinal, itching, and headache were discussed and the patient wished to proceed. Functioning IV was confirmed and monitors were applied. Sterile prep and drape, including hand hygiene, mask, and sterile gloves were used. The patient was positioned and the spine was prepped. The skin was anesthetized with lidocaine. Free flow of clear CSF was obtained prior to injecting local anesthetic into the CSF. The spinal needle aspirated freely following injection. The needle was carefully withdrawn. The patient tolerated the procedure well.   [name] Don, MD

## 2022-01-07 ENCOUNTER — Encounter (HOSPITAL_COMMUNITY): Payer: Self-pay | Admitting: Family Medicine

## 2022-01-07 LAB — GC/CHLAMYDIA PROBE AMP (~~LOC~~) NOT AT ARMC
Chlamydia: NEGATIVE
Comment: NEGATIVE
Comment: NORMAL
Neisseria Gonorrhea: NEGATIVE

## 2022-01-07 LAB — CBC
HCT: 28.6 % — ABNORMAL LOW (ref 36.0–46.0)
Hemoglobin: 10.2 g/dL — ABNORMAL LOW (ref 12.0–15.0)
MCH: 33.2 pg (ref 26.0–34.0)
MCHC: 35.7 g/dL (ref 30.0–36.0)
MCV: 93.2 fL (ref 80.0–100.0)
Platelets: 191 10*3/uL (ref 150–400)
RBC: 3.07 MIL/uL — ABNORMAL LOW (ref 3.87–5.11)
RDW: 12.8 % (ref 11.5–15.5)
WBC: 25.3 10*3/uL — ABNORMAL HIGH (ref 4.0–10.5)
nRBC: 0 % (ref 0.0–0.2)

## 2022-01-07 NOTE — Clinical Social Work Maternal (Signed)
CLINICAL SOCIAL WORK MATERNAL/CHILD NOTE  Patient Details  Name: [name] [name] MRN: [id number] Date of Birth: 04-19-1985  Date:  01/07/2022  Clinical Social Worker Initiating Note:  [name] [name] Date/Time: Initiated:  01/07/22/1306     Child's Name:  [name] [name] and [name] [name] Parents:  Mother, Father [name] [name]  11/08/74 [phone])   Need for Interpreter:  None   Reason for Referral:  Behavioral Health Concerns, Current Substance Use/Substance Use During Pregnancy     Address:  [address] [place] [address]    Phone number:  [phone] (home)     Additional phone number: [name] Number is [phone]  Household Members/Support Persons (HM/SP):   Household Member/Support Person 1   HM/SP Name Relationship DOB or Age  HM/SP -1 [name] [name] daughter 10/07/20  HM/SP -2        HM/SP -3        HM/SP -4        HM/SP -5        HM/SP -6        HM/SP -7        HM/SP -8          Natural Supports (not living in the home):  Extended Family, Immediate Family, Parent, Spouse/significant other   Professional Supports: Therapist   Employment: Unemployed, Disabled   Type of Work:     Education:  [name] arranged:    Museum/gallery curator Resources:  [employer], SSI/Disability    Other Resources:  Physicist, medical  , [employer], [employer]     Cultural/Religious Considerations Which May Impact Care:  None reported  Strengths:  Ability to meet basic needs  , Home prepared for child  , Understanding of illness, Pediatrician chosen   Psychotropic Medications:         Pediatrician:    [place] area  Pediatrician List:   [name] Other (Therapist, art)  [place]      Pediatrician Fax Number:    Risk Factors/Current Problems:  Substance Use  , Family/Relationship Issues  , Mental Health Concerns     Cognitive State:  Able to Concentrate  , Alert  ,  Linear Thinking  , Insightful  , Goal Oriented     Mood/Affect:  Comfortable  , Interested  , Tearful  , Sad  , Overwhelmed     CSW Assessment: CSW met with [name] in room 105 to complete an assessment for MH hx and SA hx.  When CSW arrived, [name] was tearfully eating her lunch.  CSW asked about [name]'s tears and [name] shared that she an [name] was having a disagreement about the twins name.  CSW explained CSW's role and need for CSW to complete assessment. [name] was welcoming and was forthcoming with CSW.  Throughout the assessment [name] was appropriately tearful.   CSW asked about [name]'s MH hx.  [name] acknowledge receiving month disability for her [place] and reported she was dx with Bipolar around 2012.  Per [name] she discontinued all medications about 2 years ago.  However, she has been engaged in outpatient counseling with therapist [name] [name] a [place].  Per [name], she attends weekly sessions. CSW provided education regarding the baby blues period vs. perinatal mood disorders, discussed treatment and gave resources for mental health follow up if concerns arise.  CSW recommends self-evaluation during the postpartum time period using  the New Mom Checklist from Postpartum Progress and encouraged [name] to contact a medical professional if symptoms are noted at any time.  [name] did not present with any acute symptoms and reported feeling comfortable seeking help if additional help is needed.  [name] shared that she has the support of [name] and [name]'s family if help is needed. CSW assessed for safety and [name] denied SI, HI, and DV.   CSW asked about [name]'s SA hx.  [name] openly acknowledged the use of THC throughout her pregnancy.  [name] also shared that she used relapsed off cocaine a few weeks ago after breaking up with [name]. Per [name] she is scheduled to meet with a SA counselor post discharge.  CSW made [name] aware that twins UDS were positive for cocaine and CSW will make a report to [place].  [name] admits having CPS hx and tearfully shared  that she understanding the CPS investigation process.   CSW provided review of Sudden Infant Death Syndrome (SIDS) precautions. [name] stated she has all essentials, including a bassinet, packn'play and car seats for twins. [name] denies any transportation barriers to follow-up care.   [name] communicated feeling informed by NICU team and she denied having any questions or concerns.   CPS report made to [facility] intake worker [name] [name].  At this time there are barriers to discharge until CPS communicate a safety disposition plan.   CSW Plan/Description:  Psychosocial Support and Ongoing Assessment of Needs, Sudden Infant Death Syndrome (SIDS) Education, Perinatal Mood and Anxiety Disorder (PMADs) Education, Other Patient/Family Education, [place], Other Information/Referral to [employer], Child Copy Report  , CSW Awaiting CPS Disposition Plan, CSW Will Continue to Monitor Umbilical Cord Tissue Drug Screen Results and Make Report if Warranted   [name] [name], MSW, LCSW Clinical Social Work [phone]   [name] Nanas, LCSW 01/07/2022, 1:09 PM

## 2022-01-07 NOTE — Progress Notes (Addendum)
POD1 1LTCS and salpingectomy d/t PTL, twins, and malpresentation  Subjective: no complaints, up ad lib, voiding, and tolerating PO  Objective: Blood pressure 129/65, pulse 60, temperature 97.7 F (36.5 C), temperature source Oral, resp. rate 16, last menstrual period 05/15/2021, SpO2 100 %, unknown if currently breastfeeding.  Physical Exam:  General: alert, cooperative, and no distress Lochia: appropriate Uterine Fundus: firm Incision: dressing over the incision is c/d/I  DVT Evaluation: No significant calf/ankle edema.  Recent Labs    01/06/22 1051 01/07/22 0441  HGB 12.8 10.2*  HCT 36.4 28.6*    Assessment/Plan: Postpartum - Contraception: BTS - MOF: Bottle - Rh status: Positive - Rubella status: Immune - Dispo: Anticipate D/c on POD2 or POD3 - Consults: SW/TOC - Postop HgB normal/appropriate and no need for iron.   Neonatal - Doing well in NICU - Circumcision: Will ultimately desire circ for baby boy   LOS: 1 day   Radene Gunning 01/07/2022, 11:26 AM

## 2022-01-07 NOTE — Anesthesia Postprocedure Evaluation (Signed)
Anesthesia Post Note  Patient: [name] [name]  Procedure(s) Performed: CESAREAN SECTION MULTI-GESTATIONAL WITH TUBAL     Patient location during evaluation: PACU Anesthesia Type: Spinal Level of consciousness: awake and alert Pain management: pain level controlled Vital Signs Assessment: post-procedure vital signs reviewed and stable Respiratory status: spontaneous breathing and respiratory function stable Cardiovascular status: blood pressure returned to baseline and stable Postop Assessment: spinal receding and no apparent nausea or vomiting Anesthetic complications: no   No notable events documented.  Last Vitals:  Vitals:   01/07/22 0356 01/07/22 0809  BP: 127/75 129/65  Pulse: 64 60  Resp: 16 16  Temp: 36.6 C 36.5 C  SpO2: 98% 100%    Last Pain:  Vitals:   01/07/22 0809  TempSrc: Oral  PainSc:                  Audry Pili

## 2022-01-08 ENCOUNTER — Other Ambulatory Visit (HOSPITAL_COMMUNITY): Payer: Self-pay

## 2022-01-08 ENCOUNTER — Other Ambulatory Visit: Payer: Self-pay

## 2022-01-08 LAB — CULTURE, BETA STREP (GROUP B ONLY)

## 2022-01-08 LAB — GLUCOSE, CAPILLARY: Glucose-Capillary: 58 mg/dL — ABNORMAL LOW (ref 70–99)

## 2022-01-08 LAB — SURGICAL PATHOLOGY

## 2022-01-08 MED ORDER — IBUPROFEN 600 MG PO TABS
600.0000 mg | ORAL_TABLET | Freq: Four times a day (QID) | ORAL | 0 refills | Status: DC | PRN
  Filled 2022-01-08: qty 60, 15d supply, fill #0

## 2022-01-08 MED ORDER — HYDROXYZINE HCL 25 MG PO TABS
25.0000 mg | ORAL_TABLET | ORAL | 0 refills | Status: DC | PRN
  Filled 2022-01-08: qty 60, 10d supply, fill #0

## 2022-01-08 MED ORDER — ACETAMINOPHEN 500 MG PO TABS
1000.0000 mg | ORAL_TABLET | Freq: Four times a day (QID) | ORAL | 0 refills | Status: DC
  Filled 2022-01-08: qty 30, 4d supply, fill #0

## 2022-01-08 MED ORDER — OXYCODONE HCL 5 MG PO TABS
5.0000 mg | ORAL_TABLET | ORAL | 0 refills | Status: DC | PRN
  Filled 2022-01-08: qty 20, 2d supply, fill #0

## 2022-01-08 NOTE — Inpatient Diabetes Management (Signed)
Inpatient Diabetes Program Recommendations  AACE/ADA: New Consensus Statement on Inpatient Glycemic Control (2015)  Target Ranges:  Prepandial:   less than 140 mg/dL      Peak postprandial:   less than 180 mg/dL (1-2 hours)      Critically ill patients:  140 - 180 mg/dL   Lab Results  Component Value Date   GLUCAP 58 (L) 01/07/2022   HGBA1C 5.1 07/24/2021   Diabetes history: None Outpatient Diabetes medications: None Current orders for Inpatient glycemic control: None  Inpatient Diabetes Program Recommendations:    Noted glucose 58 mg/dL on 11/7. May want to repeat [facility]SBG.   Thanks, Bronson Curb, MSN, RNC-OB Diabetes Coordinator [phone] (8a-5p)

## 2022-01-08 NOTE — BH Specialist Note (Signed)
Brief less than 2 minute call today; pt attempting to get twins dressed for their medical checkup at 3pm today (just over 30 minutes from now), overwhelmed; [facility] agrees to call by no later than 5:30pm today to reschedule today's visit; pt agrees.

## 2022-01-08 NOTE — Progress Notes (Signed)
CSW received a telephone call from [place] worker P. Sabra Heck.  Per CPS, CSW report was not accepted and CPS will not follow-up with family.  Per CPS there are no barriers to twins discharge when twins are medically ready.   CSW will continue to monitor twins CDS and will report results to CPS.   CSW updated NICU RN, [name] and [name].   CSW will continue to offer resources and supports to family while infant remains in NICU.    Laurey Arrow, MSW, LCSW Clinical Social Work [phone]

## 2022-01-09 ENCOUNTER — Ambulatory Visit: Payer: Medicaid Other | Admitting: Clinical

## 2022-01-09 ENCOUNTER — Other Ambulatory Visit: Payer: Medicaid Other

## 2022-01-09 ENCOUNTER — Ambulatory Visit: Payer: Medicaid Other

## 2022-01-09 DIAGNOSIS — F319 Bipolar disorder, unspecified: Secondary | ICD-10-CM

## 2022-01-09 DIAGNOSIS — F1290 Cannabis use, unspecified, uncomplicated: Secondary | ICD-10-CM

## 2022-01-09 DIAGNOSIS — Z658 Other specified problems related to psychosocial circumstances: Secondary | ICD-10-CM

## 2022-01-15 ENCOUNTER — Ambulatory Visit (INDEPENDENT_AMBULATORY_CARE_PROVIDER_SITE_OTHER): Payer: Medicaid Other

## 2022-01-15 ENCOUNTER — Encounter: Payer: Medicaid Other | Admitting: Family Medicine

## 2022-01-15 ENCOUNTER — Other Ambulatory Visit: Payer: Self-pay

## 2022-01-15 VITALS — BP 133/89 | HR 73

## 2022-01-15 DIAGNOSIS — R112 Nausea with vomiting, unspecified: Secondary | ICD-10-CM

## 2022-01-15 MED ORDER — PROMETHAZINE HCL 25 MG PO TABS: 25.0000 mg | ORAL_TABLET | Freq: Four times a day (QID) | ORAL | 0 refills | Status: DC | PRN

## 2022-01-15 NOTE — Progress Notes (Signed)
Incision Check Visit  [name] [name] is here for incision check following primary c-section on 01/06/2022.   Assessment: Honeycomb dressing was removed. Incision is clean, dry and intact.   Education: Reviewed good wound care and s/s of infection with patient.  Patient will follow up post partum visit .  Darlyne Russian, RN 01/15/2022  9:25 AM

## 2022-01-16 ENCOUNTER — Ambulatory Visit: Payer: Medicaid Other

## 2022-01-22 ENCOUNTER — Ambulatory Visit: Payer: Medicaid Other | Admitting: Clinical

## 2022-01-22 DIAGNOSIS — Z658 Other specified problems related to psychosocial circumstances: Secondary | ICD-10-CM

## 2022-01-30 ENCOUNTER — Telehealth: Payer: Self-pay | Admitting: Family Medicine

## 2022-01-30 NOTE — Telephone Encounter (Signed)
A knot appeared on the left side of the incision and it bust with white pus and blood. Needs a nurse to call back to see what steps need to be taken.

## 2022-01-31 ENCOUNTER — Telehealth (INDEPENDENT_AMBULATORY_CARE_PROVIDER_SITE_OTHER): Payer: Medicaid Other | Admitting: Student in an Organized Health Care Education/Training Program

## 2022-01-31 ENCOUNTER — Encounter (HOSPITAL_COMMUNITY): Payer: Self-pay | Admitting: Student in an Organized Health Care Education/Training Program

## 2022-01-31 DIAGNOSIS — F3170 Bipolar disorder, currently in remission, most recent episode unspecified: Secondary | ICD-10-CM

## 2022-01-31 DIAGNOSIS — F1910 Other psychoactive substance abuse, uncomplicated: Secondary | ICD-10-CM | POA: Diagnosis not present
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
## 2022-01-31 NOTE — Telephone Encounter (Signed)
Called pt and she did not answer.  I left a message stating that the information she provided in her telephone message may indicate there is an infection. For this reason, she needs examination and possible treatment. She should go to [facility] today for evaluation.

## 2022-02-01 ENCOUNTER — Inpatient Hospital Stay (HOSPITAL_COMMUNITY)
Admission: AD | Admit: 2022-02-01 | Discharge: 2022-02-01 | Disposition: A | Discharge status: Home / Self Care | Payer: Medicaid Other | Attending: Obstetrics and Gynecology | Admitting: Obstetrics and Gynecology

## 2022-02-01 ENCOUNTER — Other Ambulatory Visit: Payer: Self-pay

## 2022-02-01 ENCOUNTER — Encounter (HOSPITAL_COMMUNITY): Payer: Self-pay | Admitting: Obstetrics and Gynecology

## 2022-02-01 DIAGNOSIS — Z98891 History of uterine scar from previous surgery: Secondary | ICD-10-CM | POA: Insufficient documentation

## 2022-02-01 DIAGNOSIS — L0102 Bockhart's impetigo: Secondary | ICD-10-CM | POA: Diagnosis not present

## 2022-02-01 MED ORDER — MUPIROCIN CALCIUM 2 % EX CREA
TOPICAL_CREAM | Freq: Once | CUTANEOUS | Status: AC
  Filled 2022-02-01: qty 15

## 2022-02-01 NOTE — MAU Provider Note (Signed)
  History     CSN: [id number]  Arrival date and time: 02/01/22 2034   Event Date/Time   First Provider Initiated Contact with Patient 02/01/22 2131      Chief Complaint  Patient presents with  . Drainage from Incision  . Incisional Pain   HPI  {GYN/OB [id number]}  Past Medical History:  Diagnosis Date  . Anxiety   . Bipolar disorder ([place])   . Boils   . Headache(784.0)   . PCOS (polycystic ovarian syndrome) 02/26/2012  . Recurrent UTI 05/15/2013   Neg 03/2020  E. Coli  . Smoker 01/18/2019    Past Surgical History:  Procedure Laterality Date  . CESAREAN SECTION MULTI-GESTATIONAL WITH TUBAL N/A 01/06/2022   Procedure: CESAREAN SECTION MULTI-GESTATIONAL WITH TUBAL;  Surgeon: Clarnce Flock, MD;  Location: [facility] LD [facility]S;  Service: Obstetrics;  Laterality: N/A;  . LAPAROSCOPIC UNILATERAL SALPINGECTOMY Left    paratubal cystectomy    Family History  Problem Relation Age of Onset  . Mental illness Mother   . Thyroid disease Mother   . Asthma Sister   . Asthma Brother   . Alcohol abuse Brother   . Cancer Paternal Aunt   . Breast cancer Paternal Aunt   . Hypertension Maternal Grandmother   . Birth defects Neg Hx   . Diabetes Neg Hx   . Heart disease Neg Hx   . Stroke Neg Hx     Social History   Tobacco Use  . Smoking status: Every Day    Packs/day: 0.25    Types: Cigarettes  . Smokeless tobacco: Never  Vaping Use  . Vaping Use: Former  . Substances: Nicotine, THC  Substance Use Topics  . Alcohol use: Yes    Comment: "wine cooler a day"  . Drug use: Yes    Types: Marijuana, Cocaine    Comment: last used 10/31 cocaine. Marijuana use everyday.    Allergies:  Allergies  Allergen Reactions  . Ibuprofen Nausea And Vomiting    Stomach upset    Medications Prior to Admission  Medication Sig Dispense Refill Last Dose  . acetaminophen (TYLENOL) 500 MG tablet Take 2 tablets (1,000 mg total) by mouth every 6 (six) hours. 30 tablet 0 Past Week  . ibuprofen  (ADVIL) 600 MG tablet Take 1 tablet (600 mg total) by mouth every 6 (six) hours as needed. 60 tablet 0 Past Week  . promethazine (PHENERGAN) 25 MG tablet Take 1 tablet (25 mg total) by mouth every 6 (six) hours as needed for nausea or vomiting. 30 tablet 0 Past Week  . hydrOXYzine (ATARAX) 25 MG tablet Take 1 tablet (25 mg total) by mouth every 4 (four) hours as needed for anxiety. (Patient not taking: Reported on 01/15/2022) 60 tablet 0   . oxyCODONE (OXY IR/ROXICODONE) 5 MG immediate release tablet Take 1-2 tablets (5-10 mg total) by mouth every 4 (four) hours as needed for moderate pain. 20 tablet 0     Review of Systems Physical Exam   Blood pressure 126/78, pulse 85, temperature 98.1 F (36.7 C), temperature source Oral, resp. rate 18, height '5\' 11"'$  (1.803 m), weight 97.1 kg, SpO2 99 %, unknown if currently breastfeeding.  Physical Exam  MAU Course  Procedures  MDM ***  Assessment and Plan  ***  [name] [name] 02/01/2022, 9:31 PM

## 2022-02-01 NOTE — MAU Note (Addendum)
.  [name] [name] is a 36 y.o. at Unknown here in MAU reporting: PP c/s with twins on 11/6 .Marland Kitchen On her incision she started to get "a boil" and it "burst" Wednesday morning and has been draining ever since - yellow/white and bloody drainage. Reports bleeding had subsided, but started having vaginal discharge that is yellow and pink - "Mucous-like and creamy". States incision is warm to the touch.   Pain score: 8 - incision  Vitals:   02/01/22 2051  BP: 126/78  Pulse: 85  Resp: 18  Temp: 98.1 F (36.7 C)  SpO2: 99%     FHT: pt is postpartum  Lab orders placed from triage:  none

## 2022-02-11 ENCOUNTER — Encounter: Payer: Self-pay | Admitting: Family Medicine

## 2022-02-11 ENCOUNTER — Ambulatory Visit (INDEPENDENT_AMBULATORY_CARE_PROVIDER_SITE_OTHER): Payer: Medicaid Other | Admitting: Family Medicine

## 2022-02-11 DIAGNOSIS — Z8751 Personal history of pre-term labor: Secondary | ICD-10-CM | POA: Diagnosis not present

## 2022-02-11 DIAGNOSIS — Z9851 Tubal ligation status: Secondary | ICD-10-CM

## 2022-02-11 DIAGNOSIS — Z124 Encounter for screening for malignant neoplasm of cervix: Secondary | ICD-10-CM | POA: Diagnosis not present

## 2022-02-11 NOTE — Progress Notes (Signed)
[place] Partum Visit Note  [name] [name] is a 36 y.o. [phone] female who presents for a postpartum visit. She is 5 weeks postpartum following a primary cesarean section for breech presentation of twin A.  I have fully reviewed the prenatal and intrapartum course. The delivery was at 71w5dgestational weeks.  Anesthesia: epidural. Postpartum course has been complicated by preterm delivery twins  di-di and NICU. Babies is doing well. Baby is feeding by bottle - Nutramigen . Bleeding no bleeding. Bowel function is normal. Bladder function is normal. Patient is not sexually active. Contraception method is  BTL . Postpartum depression screening: negative.   The pregnancy intention screening data noted above was reviewed. Potential methods of contraception were discussed. The patient elected to proceed with No data recorded.   Edinburgh Postnatal Depression Scale - 02/11/22 1127       Edinburgh Postnatal Depression Scale:  In the Past 7 Days   I have been able to laugh and see the funny side of things. 0    I have looked forward with enjoyment to things. 0    I have blamed myself unnecessarily when things went wrong. 0    I have been anxious or worried for no good reason. 0    I have felt scared or panicky for no good reason. 0    Things have been getting on top of me. 0    I have been so unhappy that I have had difficulty sleeping. 0    I have felt sad or miserable. 0    I have been so unhappy that I have been crying. 0    The thought of harming myself has occurred to me. 0    Edinburgh Postnatal Depression Scale Total 0             Health Maintenance Due  Topic Date Due   INFLUENZA VACCINE  Never done   COVID-19 Vaccine (3 - 2023-24 season) 11/01/2021    The following portions of the patient's history were reviewed and updated as appropriate: allergies, current medications, past family history, past medical history, past social history, past surgical history, and problem  list.  Review of Systems Pertinent items are noted in HPI.  Objective:  BP 113/64   Pulse 90   Wt 218 lb 14.4 oz (99.3 kg)   Breastfeeding No   BMI 30.53 kg/m    General:  alert, cooperative, and appears stated age   Breasts:  not indicated  Lungs: clear to auscultation bilaterally  Heart:  regular rate and rhythm, S1, S2 normal, no murmur, click, rub or gallop  Abdomen: soft, non-tender; bowel sounds normal; no masses,  no organomegaly   Wound well approximated incision. Has overlying folliculitis with   GU exam:  not indicated       Assessment:   Normal postpartum exam.   Plan:   Essential components of care per ACOG recommendations:  1.  Mood and well being: Patient with negative depression screening today. Reviewed local resources for support.  - Patient tobacco use? Yes. Patient desires to quit? No.   - hx of drug use? Yes. Discussed support systems and outpatient/inpatient treatment options.     - UDS on delivery was positive for benzo, cocaine and THC  2. Infant care and feeding:  -Patient currently breastmilk feeding? No.  -Social determinants of health (SDOH) reviewed in EPIC. Concerns with SUD.   3. Sexuality, contraception and birth spacing - Patient does not want a pregnancy  in the next year.  Desired family size is 3 children.  - Reviewed reproductive life planning. Reviewed contraceptive methods based on pt preferences and effectiveness.  Patient desired Female Sterilization today.   - Discussed birth spacing of 18 months  4. Sleep and fatigue -Encouraged family/partner/community support of 4 hrs of uninterrupted sleep to help with mood and fatigue  5. Physical Recovery  - Discussed patients delivery and complications. She describes her labor as good. - Patient had a C-section. Patient expressed understanding - Patient has urinary incontinence? No. - Patient is safe to resume physical and sexual activity  6.  Health Maintenance - HM due items  addressed Yes - Last pap smear -[facility]/05/19 at [facility].  -Breast Cancer screening indicated? No.   7. Chronic Disease/Pregnancy Condition follow up: None  - PCP follow up  Caren Macadam, [place] for [place], [place]

## 2022-03-06 ENCOUNTER — Telehealth: Payer: Self-pay | Admitting: Family Medicine

## 2022-03-06 NOTE — Telephone Encounter (Signed)
Called pt who reports nausea and low back pain. Has not taken UPT. Recommended home UPT. If negative recommended patient be seen at urgent care or PCP if she is concerned about symptoms. Reviewed availability of MAU if positive UPT and continued concern.

## 2022-03-06 NOTE — Telephone Encounter (Signed)
Patient got a BTL but is having pregnancy symptoms would like a nurse to call back to see her next steps

## 2022-03-14 ENCOUNTER — Telehealth (HOSPITAL_COMMUNITY): Payer: Medicaid Other | Admitting: Student in an Organized Health Care Education/Training Program

## 2022-03-14 NOTE — Progress Notes (Incomplete)
[facility] MD/PA/NP OP Progress Note  Virtual Visit via Video Note  I connected with [name] [name] on 03/14/22 at  2:00 PM EST by a video enabled telemedicine application and verified that I am speaking with the correct person using two identifiers.  Location: Patient: *** Provider: [name]   I discussed the limitations of evaluation and management by telemedicine and the availability of in person appointments. The patient expressed understanding and agreed to proceed.  03/14/2022 7:42 AM [name] [name]  MRN:  [id number]  Chief Complaint: No chief complaint on file.  HPI:  [name] [name] is a 37 yr old female who presents via Virtual Video Visit for Follow Up and is interested in therapy. PPHx is significant for Bipolar Affective Disorder and history of Depression, Psychosis, and Bipolar Disorder, and multiple Psychiatric Hospitalizations (latest [facility] 2016), and 1 Prior Suicide Attempt (age 41), and no history of Self Injurious Behavior.    ***   Visit Diagnosis: No diagnosis found.  Past Psychiatric History: Bipolar Affective Disorder and history of Depression, Psychosis, and Bipolar Disorder, and multiple Psychiatric Hospitalizations (latest [facility] 2016), and 1 Prior Suicide Attempt (age 3), and no history of Self Injurious Behavior.   Past Medical History:  Past Medical History:  Diagnosis Date   Anxiety    Bipolar disorder ([place])    Boils    Headache(784.0)    PCOS (polycystic ovarian syndrome) 02/26/2012   Recurrent UTI 05/15/2013   Neg 03/2020  E. Coli   Smoker 01/18/2019   Status post primary low transverse cesarean section 01/06/2022    Past Surgical History:  Procedure Laterality Date   CESAREAN SECTION MULTI-GESTATIONAL WITH TUBAL N/A 01/06/2022   Procedure: CESAREAN SECTION MULTI-GESTATIONAL WITH TUBAL;  Surgeon: Clarnce Flock, MD;  Location: [facility] LD ORS;  Service: Obstetrics;  Laterality: N/A;   LAPAROSCOPIC UNILATERAL SALPINGECTOMY Left     paratubal cystectomy    Family Psychiatric History: Mother- Psychiatric Hospitalization and unknown diagnosis Multiple Members- THC use No Known Suicides.  Family History:  Family History  Problem Relation Age of Onset   Mental illness Mother    Thyroid disease Mother    Asthma Sister    Asthma Brother    Alcohol abuse Brother    Cancer Paternal Aunt    Breast cancer Paternal Aunt    Hypertension Maternal Grandmother    Birth defects Neg Hx    Diabetes Neg Hx    Heart disease Neg Hx    Stroke Neg Hx     Social History:  Social History   Socioeconomic History   Marital status: Single    Spouse name: Not on file   Number of children: Not on file   Years of education: Not on file   Highest education level: Not on file  Occupational History   Not on file  Tobacco Use   Smoking status: Every Day    Packs/day: 0.25    Types: Cigarettes   Smokeless tobacco: Never  Vaping Use   Vaping Use: Former   Substances: Nicotine, THC  Substance and Sexual Activity   Alcohol use: Yes    Comment: "wine cooler a day"   Drug use: Yes    Types: Marijuana, Cocaine    Comment: last used 10/31 cocaine. Marijuana use everyday.   Sexual activity: Not Currently    Birth control/protection: None  Other Topics Concern   Not on file  Social History Narrative   Not on file   Social Determinants of  Health   Financial Resource Strain: Not on file  Food Insecurity: No Food Insecurity (01/08/2022)   Hunger Vital Sign    Worried About Running Out of Food in the Last Year: Never true    Ran Out of Food in the Last Year: Never true  Transportation Needs: No Transportation Needs (01/08/2022)   PRAPARE - Hydrologist (Medical): No    Lack of Transportation (Non-Medical): No  Physical Activity: Not on file  Stress: Not on file  Social Connections: Not on file    Allergies:  Allergies  Allergen Reactions   Ibuprofen Nausea And Vomiting    Stomach upset     Metabolic Disorder Labs: Lab Results  Component Value Date   HGBA1C 5.1 07/24/2021   MPG 105 08/03/2014   No results found for: "PROLACTIN" Lab Results  Component Value Date   CHOL 143 08/03/2014   TRIG 81 08/03/2014   HDL 44 08/03/2014   CHOLHDL 3.3 08/03/2014   VLDL 16 08/03/2014   LDLCALC 83 08/03/2014   LDLCALC 100 (H) 03/17/2012   Lab Results  Component Value Date   TSH 1.297 08/03/2014   TSH 0.197 (L) 05/13/2013    Therapeutic Level Labs: No results found for: "LITHIUM" Lab Results  Component Value Date   VALPROATE <10.0 (L) 04/13/2012   VALPROATE 76.1 03/29/2012   No results found for: "CBMZ"  Current Medications: Current Outpatient Medications  Medication Sig Dispense Refill   acetaminophen (TYLENOL) 500 MG tablet Take 2 tablets (1,000 mg total) by mouth every 6 (six) hours. (Patient not taking: Reported on 02/11/2022) 30 tablet 0   hydrOXYzine (ATARAX) 25 MG tablet Take 1 tablet (25 mg total) by mouth every 4 (four) hours as needed for anxiety. (Patient not taking: Reported on 01/15/2022) 60 tablet 0   ibuprofen (ADVIL) 600 MG tablet Take 1 tablet (600 mg total) by mouth every 6 (six) hours as needed. (Patient not taking: Reported on 02/11/2022) 60 tablet 0   oxyCODONE (OXY IR/ROXICODONE) 5 MG immediate release tablet Take 1-2 tablets (5-10 mg total) by mouth every 4 (four) hours as needed for moderate pain. (Patient not taking: Reported on 02/11/2022) 20 tablet 0   promethazine (PHENERGAN) 25 MG tablet Take 1 tablet (25 mg total) by mouth every 6 (six) hours as needed for nausea or vomiting. (Patient not taking: Reported on 02/11/2022) 30 tablet 0   No current facility-administered medications for this visit.     Musculoskeletal: Strength & Muscle Tone: {desc; muscle tone:32375} Gait & Station: {PE GAIT ED PIRJ:18841} Patient leans: {Patient Leans:21022755}  Psychiatric Specialty Exam: Review of Systems  not currently breastfeeding.There is no  height or weight on file to calculate BMI.  General Appearance: {Appearance:22683}  Eye Contact:  {BHH EYE CONTACT:22684}  Speech:  {Speech:22685}  Volume:  {Volume (PAA):22686}  Mood:  {BHH MOOD:22306}  Affect:  {Affect (PAA):22687}  Thought Process:  {Thought Process (PAA):22688}  Orientation:  {BHH ORIENTATION (PAA):22689}  Thought Content: {Thought Content:22690}   Suicidal Thoughts:  {ST/HT (PAA):22692}  Homicidal Thoughts:  {ST/HT (PAA):22692}  Memory:  {BHH MEMORY:22881}  Judgement:  {Judgement (PAA):22694}  Insight:  {Insight (PAA):22695}  Psychomotor Activity:  {Psychomotor (PAA):22696}  Concentration:  {Concentration:21399}  Recall:  {BHH GOOD/FAIR/POOR:22877}  Fund of Knowledge: {BHH GOOD/FAIR/POOR:22877}  Language: {BHH GOOD/FAIR/POOR:22877}  Akathisia:  {BHH YES OR NO:22294}  Handed:  Right  AIMS (if indicated): {Desc; done/not:10129}  Assets:  {Assets (PAA):22698}  ADL's:  {BHH YSA'Y:30160}  Cognition: {chl bhh cognition:304700322}  Sleep:  {  [place] GOOD/FAIR/POOR:22877}   Screenings: AIMS    Flowsheet Row Admission (Discharged) from 08/01/2014 in [facility]  AIMS Total Score 0      AUDIT    Flowsheet Row Admission (Discharged) from 08/01/2014 in [facility] Admission (Discharged) from 05/24/2012 in [facility] Admission (Discharged) from 03/17/2012 in [facility]  Alcohol Use Disorder Identification Test Final Score (AUDIT) 0 2 1      GAD-7    Flowsheet Row Routine Prenatal from 10/01/2020 in [place] for [place] [facility] for [facility] Routine Prenatal from 09/27/2020 in [facility] for [employer] [facility] for Women Routine Prenatal from 08/27/2020 in [facility] for [employer] at [facility] for Women Routine Prenatal from 08/13/2020 in Center for [place] at [facility] for Women Routine Prenatal from 07/02/2020 in [facility] for [place] at [facility] for [facility]  Total GAD-7 Score 0 0 0 0 2      PHQ2-9    Flowsheet Row Routine Prenatal from 10/01/2020 in [facility] for [employer] at [facility] for Women Routine Prenatal from 09/27/2020 in Center for [place] at [facility] for Women Routine Prenatal from 08/27/2020 in [facility] for [place] at [facility] for Women Routine Prenatal from 08/13/2020 in Center for [place] at [facility] for Women Routine Prenatal from 07/02/2020 in [facility] for [employer] at [facility] for Women  PHQ-2 Total Score 0 0 0 0 0  PHQ-9 Total Score 0 0 0 0 2      Flowsheet Row Admission (Discharged) from 02/01/2022 in [facility] Admission (Discharged) from 01/06/2022 in [facility] Admission (Discharged) from 11/06/2021 in [facility]  C-SSRS RISK CATEGORY No Risk No Risk No Risk        Assessment and Plan:  [name] [name] is a 37 yr old female who presents via Virtual Video Visit for Follow Up and is interested in therapy. PPHx is significant for Bipolar Affective Disorder and history of Depression, Psychosis, and Bipolar Disorder, and multiple Psychiatric Hospitalizations (latest [facility] 2016), and 1 Prior Suicide Attempt (age 73), and no history of Self Injurious Behavior.    [place] ***   Bipolar Affective Disorder (possibly in remission): -Not interested in starting any medications at this time.     Polysubstance Abuse: Interested in starting therapy    Collaboration of Care: Collaboration of Care: {BH OP Collaboration of Care:21014065}  Patient/Guardian was advised Release of Information must be obtained prior to any record release in order to collaborate their care with an outside provider. Patient/Guardian was advised if they have not already done so  to contact the registration department to sign all necessary forms in order for [place] to release information regarding their care.   Consent: Patient/Guardian gives verbal consent for treatment and assignment of benefits for services provided during this visit. Patient/Guardian expressed understanding and agreed to proceed.    Briant Cedar, MD 03/14/2022, 7:42 AM  Follow Up Instructions:    I discussed the assessment and treatment plan with the patient. The patient was provided an opportunity to ask questions and all were answered. The patient agreed with the plan and demonstrated an understanding of the instructions.   The patient was advised to call back or seek an in-person evaluation if the symptoms worsen  or if the condition fails to improve as anticipated.  I provided *** minutes of non-face-to-face time during this encounter.   Briant Cedar, MD

## 2022-06-05 ENCOUNTER — Other Ambulatory Visit (HOSPITAL_COMMUNITY)
Admission: RE | Admit: 2022-06-05 | Discharge: 2022-06-05 | Disposition: A | Discharge status: Home / Self Care | Payer: Medicaid Other | Source: Ambulatory Visit | Attending: Family Medicine | Admitting: Family Medicine

## 2022-06-05 ENCOUNTER — Ambulatory Visit (INDEPENDENT_AMBULATORY_CARE_PROVIDER_SITE_OTHER): Payer: Medicaid Other

## 2022-06-05 DIAGNOSIS — Z3202 Encounter for pregnancy test, result negative: Secondary | ICD-10-CM

## 2022-06-05 DIAGNOSIS — R309 Painful micturition, unspecified: Secondary | ICD-10-CM

## 2022-06-05 DIAGNOSIS — Z3200 Encounter for pregnancy test, result unknown: Secondary | ICD-10-CM

## 2022-06-05 DIAGNOSIS — N898 Other specified noninflammatory disorders of vagina: Secondary | ICD-10-CM | POA: Diagnosis present

## 2022-06-05 LAB — POCT URINALYSIS DIP (DEVICE)
Bilirubin Urine: NEGATIVE
Glucose, UA: NEGATIVE mg/dL
Hgb urine dipstick: NEGATIVE
Ketones, ur: NEGATIVE mg/dL
Leukocytes,Ua: NEGATIVE
Nitrite: NEGATIVE
Protein, ur: NEGATIVE mg/dL
Specific Gravity, Urine: 1.015 (ref 1.005–1.030)
Urobilinogen, UA: 0.2 mg/dL (ref 0.0–1.0)
pH: 6 (ref 5.0–8.0)

## 2022-06-05 LAB — POCT PREGNANCY, URINE: Preg Test, Ur: NEGATIVE

## 2022-06-06 LAB — CERVICOVAGINAL ANCILLARY ONLY
Bacterial Vaginitis (gardnerella): POSITIVE — AB
Candida Glabrata: NEGATIVE
Candida Vaginitis: NEGATIVE
Chlamydia: NEGATIVE
Comment: NEGATIVE
Comment: NEGATIVE
Comment: NEGATIVE
Comment: NEGATIVE
Comment: NEGATIVE
Comment: NORMAL
Neisseria Gonorrhea: NEGATIVE
Trichomonas: NEGATIVE

## 2022-06-06 NOTE — Progress Notes (Signed)
Here in office for vaginal swab due to abnormal odor and vaginal irritation and urinalysis due to discomfort with urination. Explained vaginal swab will result in approx 48 hours and she will be contacted via MyChart. UA unremarkable. Requests UPT due to recent unprotected intercourse; UPT is negative.  Fleet Contras RN

## 2022-06-09 ENCOUNTER — Other Ambulatory Visit: Payer: Self-pay | Admitting: Family Medicine

## 2022-06-09 MED ORDER — METRONIDAZOLE 0.75 % VA GEL: 1.0000 | Freq: Every day | VAGINAL | 0 refills | Status: AC

## 2022-06-16 ENCOUNTER — Telehealth: Payer: Self-pay | Admitting: Family Medicine

## 2022-06-16 NOTE — Telephone Encounter (Signed)
Patient said she received a message in her Mychart about the name of the cream she was given patient called back and said she don't know the name of the cream she is requesting a call back

## 2022-06-17 MED ORDER — MUPIROCIN CALCIUM 2 % EX CREA: 1.0000 | TOPICAL_CREAM | Freq: Two times a day (BID) | CUTANEOUS | 0 refills | Status: DC

## 2022-06-17 NOTE — Telephone Encounter (Signed)
Called and spoke with patient. 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 She has 2 new bumps that have not come to a head yet or ruptured right at her c/s incision area. She would like to have Bactroban re-prescribed.   Reviewed warm compressed and not trying to squeeze the areas and allowing them to rupture on their own. Patient voiced understanding.   Bactroban sent to Pharmacy, My Chart message sent to patient.

## 2022-06-29 IMAGING — US US MFM OB DETAIL+14 WK
1 series · 13 of 28 positions shown · non-contrast
Comparison: none

[Series 1: us mfm ob detail+14 wk · 13 of 106 slices shown]
[im 4/106]
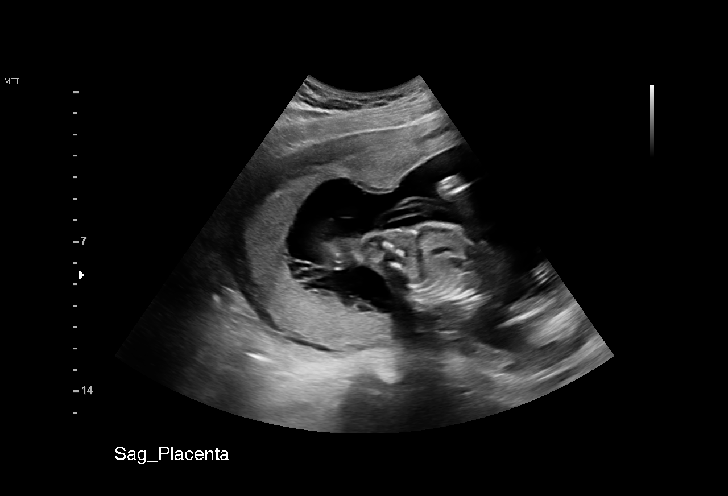
[im 12/106]
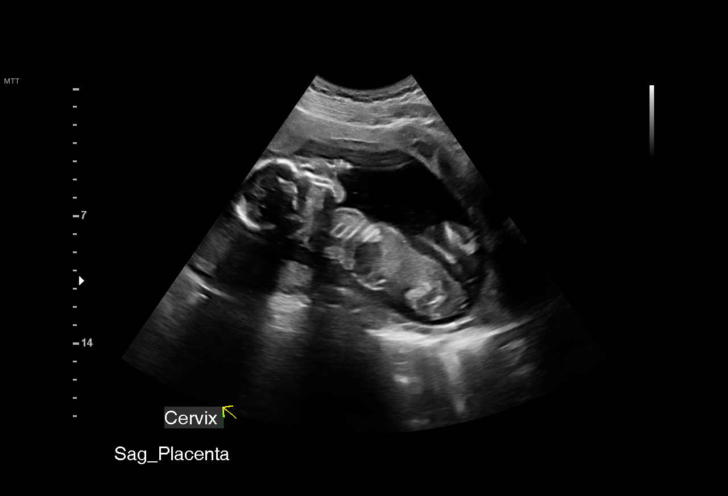
[im 20/106]
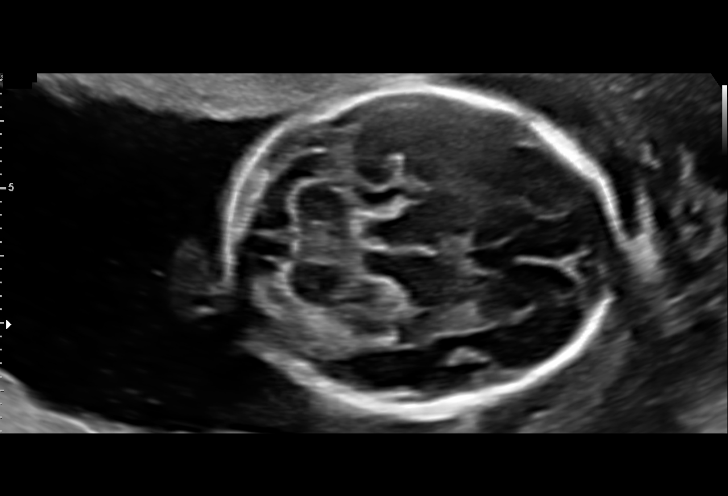
[im 28/106]
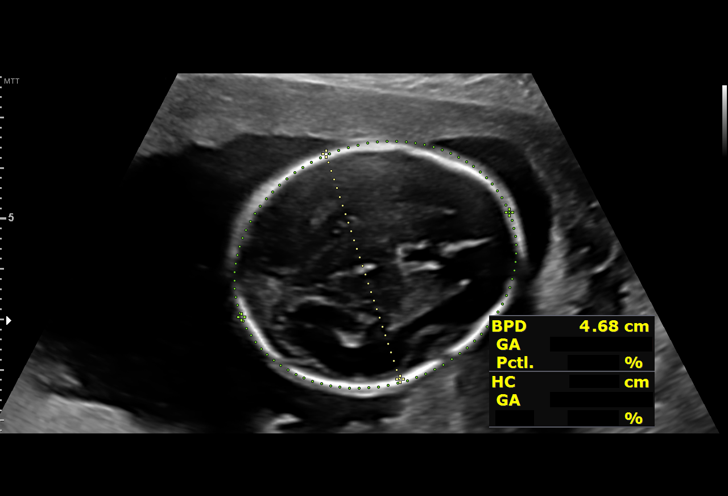
[im 36/106]
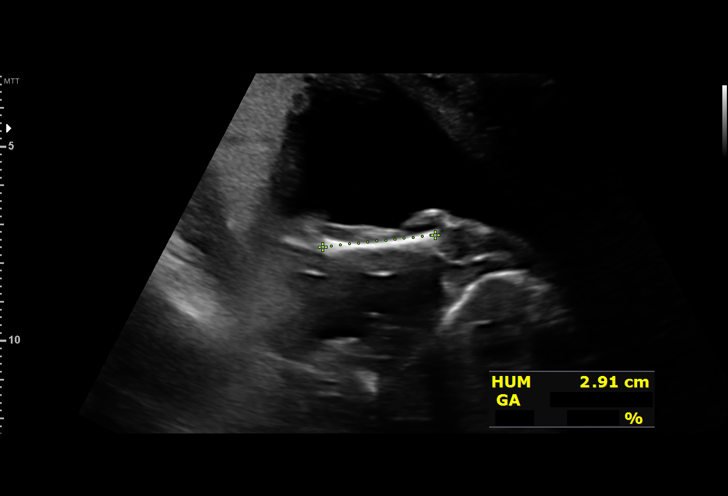
[im 43/106]
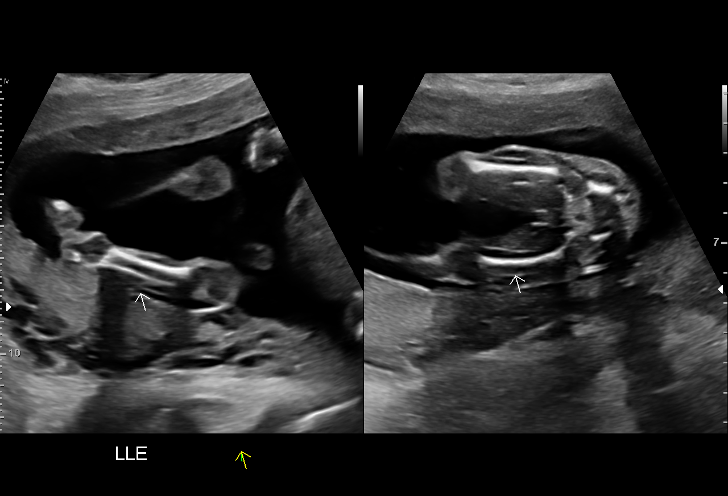
[im 55/106]
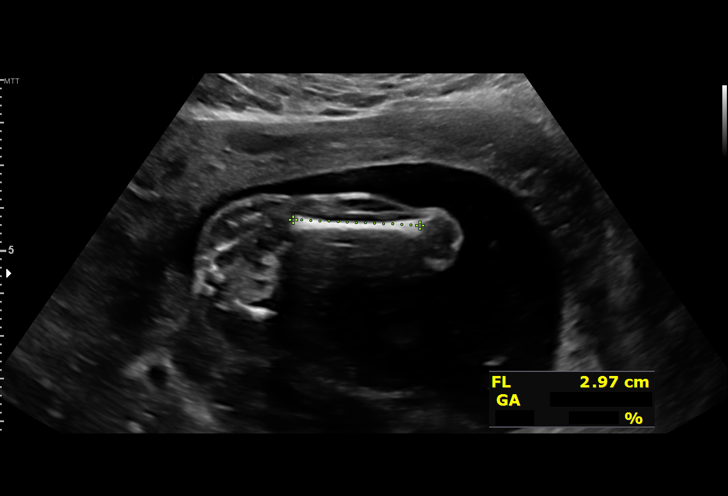
[im 63/106]
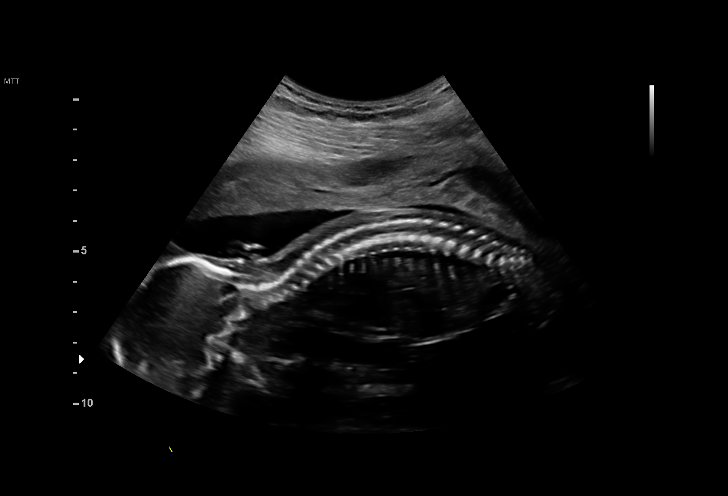
[im 71/106]
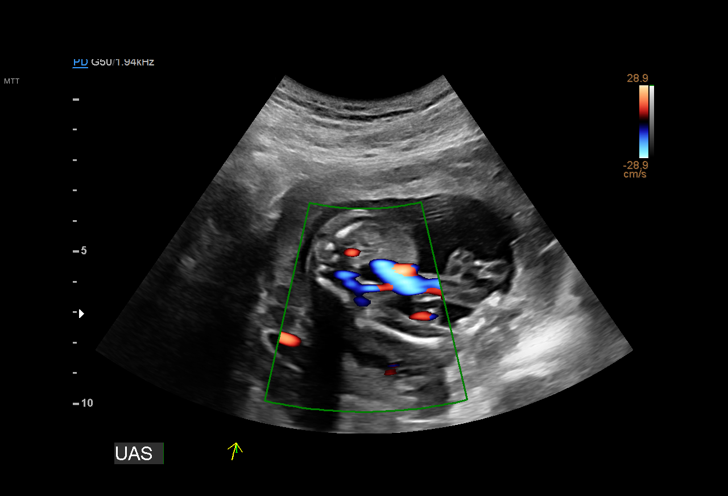
[im 78/106]
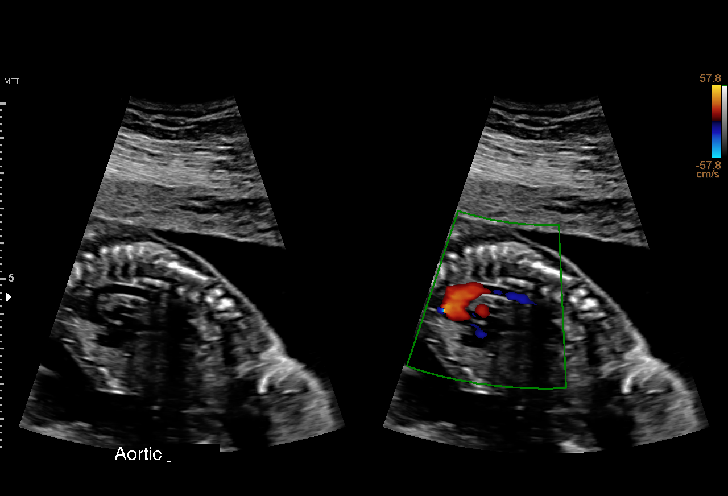
[im 86/106]
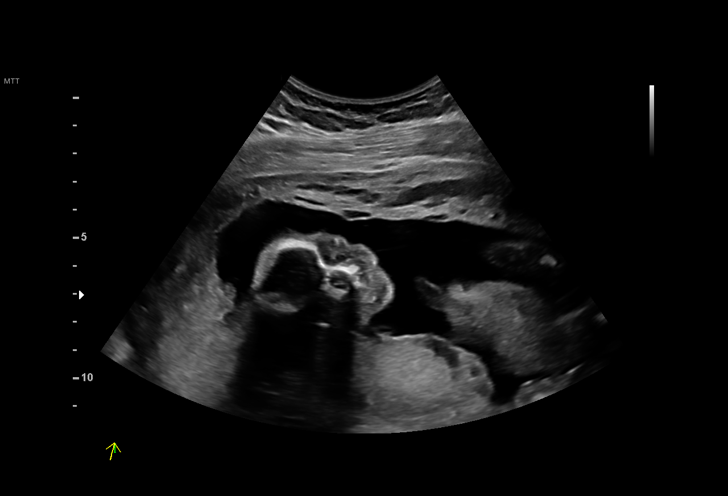
[im 94/106]
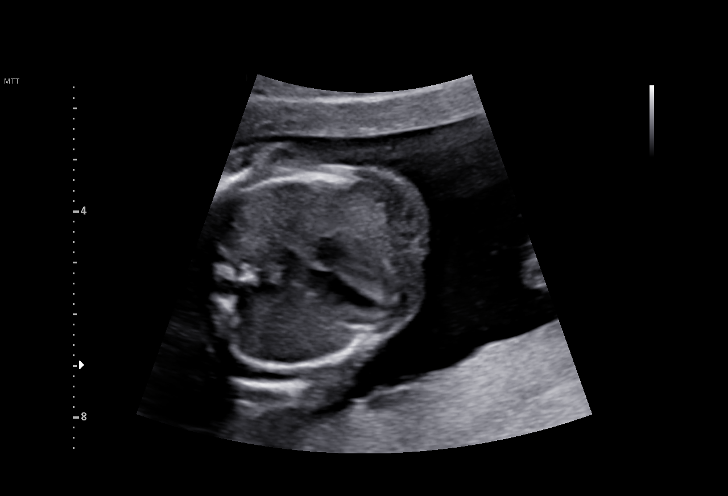
[im 102/106]
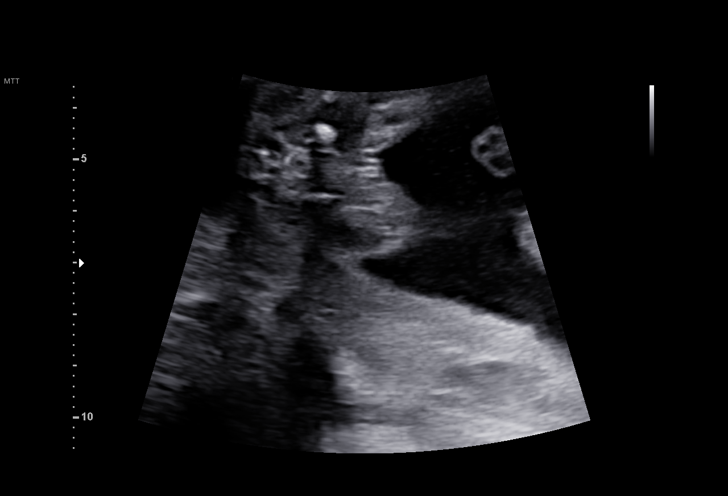

[13 of 28 positions shown; findings below may reference images not displayed]

WHITE

Indications

 Advanced maternal age primigravida 35+,
 second trimester
 Obesity complicating pregnancy, second
 trimester
 19 weeks gestation of pregnancy
 Encounter for antenatal screening for
 malformations
 Drug use complicating pregnancy, second
 trimester
 Tobacco use complicating pregnancy,
 second trimester
 Other mental disorder complicating
 pregnancy, unspecified trimester
 LR NIPS
Fetal Evaluation

 Num Of Fetuses:         1
 Fetal Heart Rate(bpm):  143
 Cardiac Activity:       Observed
 Presentation:           Breech
 Placenta:               Posterior Fundal
 P. Cord Insertion:      Visualized, central

 Amniotic Fluid
 AFI FV:      Within normal limits

                             Largest Pocket(cm)

Biometry

 BPD:      46.7  mm     G. Age:  20w 1d         78  %    CI:        77.93   %    70 - 86
                                                         FL/HC:      17.8   %    16.1 -
 HC:      167.4  mm     G. Age:  19w 3d         41  %    HC/AC:      1.14        1.09 -
 AC:      146.9  mm     G. Age:  20w 0d         64  %    FL/BPD:     63.8   %
 FL:       29.8  mm     G. Age:  19w 1d         34  %    FL/AC:      20.3   %    20 - 24
 HUM:      29.6  mm     G. Age:  19w 5d         58  %
 CER:      19.9  mm     G. Age:  19w 2d         47  %
 NFT:       3.6  mm
 LV:          6  mm
 CM:        6.6  mm

 Est. FW:     303  gm    0 lb 11 oz      57  %
OB History

 Blood Type:   A+
 Gravidity:    1         Term:   0        Prem:   0        SAB:   0
 TOP:          0       Ectopic:  0        Living: 0
Gestational Age

 LMP:           19w 3d        Date:  01/06/20                 EDD:   10/12/20
 U/S Today:     19w 5d                                        EDD:   10/10/20
 Best:          19w 3d     Det. By:  LMP  (01/06/20)          EDD:   10/12/20
Anatomy

 Cranium:               Appears normal         Aortic Arch:            Appears normal
 Cavum:                 Appears normal         Ductal Arch:            Not well visualized
 Ventricles:            Appears normal         Diaphragm:              Appears normal
 Choroid Plexus:        Appears normal         Stomach:                Appears normal, left
                                                                       sided
 Cerebellum:            Appears normal         Abdomen:                Appears normal
 Posterior Fossa:       Appears normal         Abdominal Wall:         Appears nml (cord
                                                                       insert, abd wall)
 Nuchal Fold:           Appears normal         Cord Vessels:           Appears normal (3
                                                                       vessel cord)
 Face:                  Orbits nl; profile not Kidneys:                Appear normal
                        well visualized
 Lips:                  Appears normal         Bladder:                Appears normal
 Thoracic:              Appears normal         Spine:                  Appears normal
 Heart:                 Not well visualized    Upper Extremities:      Appears normal
 RVOT:                  Not well visualized    Lower Extremities:      Appears normal
 LVOT:                  Not well visualized

 Other:  Technically difficult due to fetal position.
Cervix Uterus Adnexa

 Cervix
 Length:           3.28  cm.
 Normal appearance by transabdominal scan.
 Uterus
 No abnormality visualized.

 Right Ovary
 Within normal limits.

 Left Ovary
 Within normal limits.

 Cul De Sac
 No free fluid seen.

 Adnexa
 No abnormality visualized.
Comments

 This patient was seen for a detailed fetal anatomy scan due
 to advanced maternal age obesity.
 She denies any significant past medical history and denies
 any problems in her current pregnancy.
 She had a cell free DNA test earlier in her pregnancy which
 indicated a low risk for trisomy 21, 18, and 13. A female fetus
 is predicted.
 She was informed that the fetal growth and amniotic fluid
 level were appropriate for her gestational age.
 There were no obvious fetal anomalies noted on today's
 ultrasound exam.  However, the views of the fetal anatomy
 were limited today due to the fetal position.
 The patient was informed that anomalies may be missed due
 to technical limitations. If the fetus is in a suboptimal position
 or maternal habitus is increased, visualization of the fetus in
 the maternal uterus may be impaired.
 The increased risk of fetal aneuploidy due to advanced
 maternal age was discussed. Due to advanced maternal age,
 the patient was offered and declined an amniocentesis today
 for definitive diagnosis of fetal aneuploidy.
 A follow-up exam was scheduled in 4 weeks to complete the
 views of the fetal anatomy.

## 2022-08-13 ENCOUNTER — Ambulatory Visit (HOSPITAL_COMMUNITY)
Admission: EM | Admit: 2022-08-13 | Discharge: 2022-08-13 | Disposition: A | Discharge status: Home / Self Care | Payer: Medicaid Other | Attending: Family | Admitting: Family

## 2022-08-13 DIAGNOSIS — F319 Bipolar disorder, unspecified: Secondary | ICD-10-CM | POA: Insufficient documentation

## 2022-08-13 DIAGNOSIS — Z9151 Personal history of suicidal behavior: Secondary | ICD-10-CM | POA: Insufficient documentation

## 2022-08-13 DIAGNOSIS — F3130 Bipolar disorder, current episode depressed, mild or moderate severity, unspecified: Secondary | ICD-10-CM

## 2022-08-13 NOTE — Discharge Instructions (Addendum)
Patient is instructed prior to discharge to:  Take all medications as prescribed by his/her mental healthcare provider. Report any adverse effects and or reactions from the medicines to his/her outpatient provider promptly. Keep all scheduled appointments, to ensure that you are getting refills on time and to avoid any interruption in your medication.  If you are unable to keep an appointment call to reschedule.  Be sure to follow-up with resources and follow-up appointments provided.  Patient has been instructed & cautioned: To not engage in alcohol and or illegal drug use while on prescription medicines. In the event of worsening symptoms, patient is instructed to call the crisis hotline, 911 and or go to the nearest ED for appropriate evaluation and treatment of symptoms. To follow-up with his/her primary care provider for your other medical issues, concerns and or health care needs.  Information: -National Suicide Prevention Lifeline 1-800-SUICIDE or 1-800-273-8255.  -988 offers 24/7 access to trained crisis counselors who can help people experiencing mental health-related distress. People can call or text 988 or chat 988lifeline.org for themselves or if they are worried about a loved one who may need crisis support.     Outpatient Services for Therapy and Medication Management for Medicaid  [facility] [address] [place], [place], [address] [phone] phone  New Patient Assessment/Therapy Walk-ins Monday and Wednesday: 8am until slots are full. Every 1st and 2nd Friday: 1pm - 5pm  NO ASSESSMENT/THERAPY WALK-INS ON TUESDAYS OR THURSDAYS  New Patient Psychiatry/Medication Management Walk-ins Monday-Friday: 8am-11am  For all walk-ins, we ask that you arrive by 7:30am because patient will be seen in the order of arrival.  Availability is limited; therefore, you may not be seen on the same day that you walk-in.  Our goal is to serve and meet the needs of our  community to the best of our ability.   [facility] [address] [place], [place], [address] [phone] phone  [facility] [address] [place], [place], [address] [phone] phone (Aetna, [employer] [place], BCBS, Cigna, [employer], [employer], [facility], BCBS Healthy Blue, Humana, [employer], Medcost, Medicare, Medicaid, Optum, Tricare, UHC, UHC Community Plan, Wellcare)  [facility] [address] [place], [place], [address] [phone] phone  [facility] [address] [place], [place], [address] [phone] phone  [facility] [address] [place], [place], [address] [phone] phone  [facility] [address] [place], [place], [address] [phone] phone  [facility] [address] [place], [place], [address] [phone] phone  [facility] [address] [place], [place], [address] [phone] phone [phone] fax  [facility] [address] [place], [place], [address] [phone] phone [phone] fax  [facility] [address] [place], [place], [address] [phone] phone  [facility] [address] [place], , [address]  [phone] phone  [facility]  (Adults Only) [address] [place], , [address]  [phone] phone [phone] fax  

## 2022-08-13 NOTE — ED Provider Notes (Signed)
Behavioral Health Urgent Care Medical Screening Exam  Patient Name: [name] [name] MRN: [id number] Date of Evaluation: 08/13/22 Chief Complaint:   Diagnosis:  Final diagnoses:  Bipolar I disorder, most recent episode depressed ([name])    History of Present illness: [name] [name] is a 37 y.o. female. Patient presents voluntarily to [facility] behavioral health for walk-in assessment.  Patient is assessed, face-to-face, by nurse practitioner. She is seated in assessment area, no acute distress. Consulted with provider, Dr.  Lucianne Muss, and chart reviewed on 08/13/2022. She is alert and oriented, pleasant and cooperative during assessment.   Patient states "I have been off my medications for 4 years, I thought I was doing really good, I would like to get back on my medicines and get a therapist."  [name] [name] has been diagnosed with bipolar 1 disorder.  She reports most recent medications 4 years ago, prescribed by [facility].  She believes her medications may have been Lexapro 25 mg daily and Cogentin 5 mg daily at that time.  She stopped medications after several years of stable mood.  She was briefly seen by individual therapist at [facility] health facility while pregnant with her twins.  Discontinued therapy 7 months ago with birth of twins.  Recent stressors include CPS placed patient's 3 children with her mother 5 days ago.  Patient endorses sometimes feeling overwhelmed with care of her children.  She receives support and help from her mother and church family.  Children's father minimally supportive.   She endorses multiple previous inpatient psychiatric hospitalizations.  Per medical record patient admitted to [facility] behavioral health in 2016, previously admitted in 2014.  No family mental health history reported.  She endorses 1 previous suicide attempt at age 84. Patient  presents with depressed mood, tearful affect. She denies suicidal and homicidal ideations. Denies  current/history of self-harm.  Patient easily  contracts verbally for safety with this Clinical research associate.    Patient has normal speech and behavior.  She  denies auditory and visual hallucinations.  Patient is able to converse coherently with goal-directed thoughts and no distractibility or preoccupation.  Denies symptoms of paranoia.  Objectively there is no evidence of psychosis/mania or delusional thinking.  [name] [name] resides in [place] with with children, 56 month old twins and two-year-old daughter. Patient endorses average sleep and appetite.  Patient receives disability income related to mental health.  She denies alcohol and substance use.  She endorses average sleep and appetite.  Patient offered support and encouragement.  [facility] behavioral health observation unit admission offered, patient declines.  Patient plans to follow-up at [facility] behavioral health outpatient for walk-in hours on tomorrow morning at 7:15 AM.  She gives verbal consent to speak with her children's father, Rosezena Sensor phone number [phone].  Attempted to reach Bobby x 2, HIPAA compliant voicemail left.   Patient educated and verbalize understanding of mental health resources and other crisis services in the community. They are instructed to call 911 and present to the nearest emergency room should patient experience any suicidal/homicidal ideation, auditory/visual/hallucinations, or detrimental worsening of mental health condition.      Flowsheet Row ED from 08/13/2022 in [facility] Admission (Discharged) from 02/01/2022 in [place] 1S Maternity Assessment Unit Admission (Discharged) from 01/06/2022 in [place] [facility]  C-SSRS RISK CATEGORY No Risk No Risk No Risk       Psychiatric Specialty Exam  Presentation  General Appearance:Appropriate for Environment; Casual  Eye Contact:Good  Speech:Clear and  Coherent; Normal Rate  Speech Volume:Normal  Handedness:No  data recorded  Mood and Affect  Mood: Depressed  Affect: Depressed; Tearful   Thought Process  Thought Processes: Coherent; Goal Directed; Linear  Descriptions of Associations:Intact  Orientation:Full (Time, Place and Person)  Thought Content:Logical; WDL    Hallucinations:None  Ideas of Reference:None  Suicidal Thoughts:No  Homicidal Thoughts:No   Sensorium  Memory: Immediate Good; Recent Good  Judgment: Good  Insight: Fair   Executive Functions  Concentration: Good  Attention Span: Good  Recall: Good  Fund of Knowledge: Good  Language: Good   Psychomotor Activity  Psychomotor Activity: Normal   Assets  Assets: Communication Skills; Desire for Improvement; Financial Resources/Insurance; Housing; Physical Health; Resilience; Social Support   Sleep  Sleep: Good  Number of hours:  8   Physical Exam: Physical Exam Vitals and nursing note reviewed.  Constitutional:      Appearance: Normal appearance. She is well-developed and normal weight.  HENT:     Head: Normocephalic and atraumatic.     Nose: Nose normal.  Cardiovascular:     Rate and Rhythm: Normal rate.  Pulmonary:     Effort: Pulmonary effort is normal.  Musculoskeletal:        General: Normal range of motion.     Cervical back: Normal range of motion.  Skin:    General: Skin is warm and dry.  Neurological:     Mental Status: She is alert and oriented to person, place, and time.  Psychiatric:        Attention and Perception: Attention and perception normal.        Mood and Affect: Mood is depressed. Affect is tearful.        Speech: Speech normal.        Behavior: Behavior normal. Behavior is cooperative.        Thought Content: Thought content normal.        Cognition and Memory: Cognition and memory normal.    Review of Systems  Constitutional: Negative.   HENT: Negative.    Eyes: Negative.   Respiratory: Negative.    Cardiovascular: Negative.    Gastrointestinal: Negative.   Genitourinary: Negative.   Musculoskeletal: Negative.   Skin: Negative.   Neurological: Negative.   Psychiatric/Behavioral:  Positive for depression.    Blood pressure 123/84, pulse 90, temperature 97.6 F (36.4 C), temperature source Oral, resp. rate 18, height 5\' 11"  (1.803 m), weight 213 lb (96.6 kg), SpO2 93 %, not currently breastfeeding. Body mass index is 29.71 kg/m.  Musculoskeletal: Strength & Muscle Tone: within normal limits Gait & Station: normal Patient leans: N/A   [facility] MSE Discharge Disposition for Follow up and Recommendations: Based on my evaluation the patient does not appear to have an emergency medical condition and can be discharged with resources and follow up care in outpatient services for Medication Management and Individual Therapy Follow-up with outpatient psychiatry, resources provided  Lenard Lance, FNP 08/13/2022, 3:48 PM

## 2022-08-13 NOTE — Progress Notes (Signed)
   08/13/22 1443  [facility] Triage Screening (Walk-ins at [facility] only)  How Did You Hear About [place]? Self  What Is the Reason for Your Visit/Call Today? Pt is  a 37 yo female presenting to [facility] voluntarily for a mental health evaluation. Pt was tearful throughout assessment. Pt reports that she has been crying a lot and struggling with depression since Friday. Pt reports that she has been upset because CPS came to talk with her on Friday regarding the way she discipline her children. Pt reports that she has never hurt her kids. Pt reports that the more she thinks about the possibility of her kids being taken away the more she becomes depressed. Pt reports that she has hx of postpartum depression. Pt denies SI, HI, and AVH, and paranoia. Pt reports that she has the desire to get back on medications and see a therapist. Pt denies etoh and drug use. Pt has medicaid and resident of [place].  How Long Has This Been Causing You Problems? <Week  Have You Recently Had Any Thoughts About Hurting Yourself? No  Are You Planning to Commit Suicide/Harm Yourself At This time? No  Have you Recently Had Thoughts About Hurting Someone Karolee Ohs? No  Are You Planning To Harm Someone At This Time? No  Are you currently experiencing any auditory, visual or other hallucinations? No  Have You Used Any Alcohol or Drugs in the Past 24 Hours? No  Do you have any current medical co-morbidities that require immediate attention? No  Clinician description of patient physical appearance/behavior: Pt was casually dressed and groomed appropriately. Pt is alert, oriented x4 with normal speech and normal motor behavior. Eye contact is good. Pt's mood is depressed, and affect is flat. Thought process is coherent and relevant. Pt's insight is fair and judgement is poor. There is no indication pt is currently responding to internal stimuli or experiencing delusional thought content. Pt was cooperative throughout assessment  What Do You Feel  Would Help You the Most Today? Treatment for Depression or other mood problem;Stress Management;Medication(s)  If access to [facility] was not available, would you have sought care in the Emergency Department? Yes  Determination of Need Routine (7 days)  Options For Referral Outpatient Therapy;Medication Management    Flowsheet Row ED from 08/13/2022 in [facility] Admission (Discharged) from 02/01/2022 in [place] 1S Maternity Assessment Unit Admission (Discharged) from 01/06/2022 in [place] [facility]  C-SSRS RISK CATEGORY No Risk No Risk No Risk

## 2022-08-14 ENCOUNTER — Ambulatory Visit (INDEPENDENT_AMBULATORY_CARE_PROVIDER_SITE_OTHER): Payer: Medicaid Other | Admitting: Psychiatry

## 2022-08-14 VITALS — BP 122/97 | HR 71 | Wt 240.0 lb

## 2022-08-14 DIAGNOSIS — F1994 Other psychoactive substance use, unspecified with psychoactive substance-induced mood disorder: Secondary | ICD-10-CM

## 2022-08-14 DIAGNOSIS — Z8659 Personal history of other mental and behavioral disorders: Secondary | ICD-10-CM

## 2022-08-14 DIAGNOSIS — F1210 Cannabis abuse, uncomplicated: Secondary | ICD-10-CM | POA: Diagnosis not present

## 2022-08-14 DIAGNOSIS — F1491 Cocaine use, unspecified, in remission: Secondary | ICD-10-CM

## 2022-08-14 MED ORDER — LAMOTRIGINE 25 MG PO TABS
ORAL_TABLET | ORAL | 0 refills | Status: DC
Start: 2022-08-14 — End: 2022-09-29
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
## 2022-08-21 ENCOUNTER — Other Ambulatory Visit: Payer: Self-pay | Admitting: General Practice

## 2022-08-21 MED ORDER — MUPIROCIN 2 % EX OINT: 1.0000 | TOPICAL_OINTMENT | Freq: Two times a day (BID) | CUTANEOUS | 0 refills | Status: DC

## 2022-09-26 ENCOUNTER — Encounter (HOSPITAL_COMMUNITY): Payer: Self-pay | Admitting: Emergency Medicine

## 2022-09-26 ENCOUNTER — Ambulatory Visit (HOSPITAL_COMMUNITY)
Admission: EM | Admit: 2022-09-26 | Discharge: 2022-09-29 | Disposition: A | Discharge status: Home / Self Care | Payer: MEDICAID | Attending: Psychiatry | Admitting: Psychiatry

## 2022-09-26 ENCOUNTER — Other Ambulatory Visit: Payer: Self-pay

## 2022-09-26 DIAGNOSIS — F3164 Bipolar disorder, current episode mixed, severe, with psychotic features: Secondary | ICD-10-CM | POA: Diagnosis not present

## 2022-09-26 MED ORDER — NICOTINE 14 MG/24HR TD PT24
14.0000 mg | MEDICATED_PATCH | Freq: Every day | TRANSDERMAL | Status: DC
  Administered 2022-09-27 – 2022-09-28 (×2): 14 mg via TRANSDERMAL
  Filled 2022-09-26 (×3): qty 1

## 2022-09-26 MED ORDER — ZIPRASIDONE MESYLATE 20 MG IM SOLR
20.0000 mg | INTRAMUSCULAR | Status: AC | PRN
  Administered 2022-09-26: 20 mg via INTRAMUSCULAR
  Filled 2022-09-26: qty 20

## 2022-09-26 MED ORDER — DIPHENHYDRAMINE HCL 50 MG/ML IJ SOLN
25.0000 mg | Freq: Once | INTRAMUSCULAR | Status: DC
  Filled 2022-09-26: qty 1

## 2022-09-26 MED ORDER — LORAZEPAM 1 MG PO TABS: 2.0000 mg | ORAL_TABLET | Freq: Once | ORAL | Status: AC | PRN

## 2022-09-26 MED ORDER — OLANZAPINE 10 MG PO TBDP
10.0000 mg | ORAL_TABLET | Freq: Three times a day (TID) | ORAL | Status: DC | PRN
  Administered 2022-09-27 (×3): 10 mg via ORAL
  Filled 2022-09-26 (×3): qty 1

## 2022-09-26 MED ORDER — TRAZODONE HCL 50 MG PO TABS
50.0000 mg | ORAL_TABLET | Freq: Every evening | ORAL | Status: DC | PRN
  Administered 2022-09-28: 50 mg via ORAL
  Filled 2022-09-26: qty 1

## 2022-09-26 MED ORDER — ALUM & MAG HYDROXIDE-SIMETH 200-200-20 MG/5ML PO SUSP: 30.0000 mL | ORAL | Status: DC | PRN

## 2022-09-26 MED ORDER — ACETAMINOPHEN 325 MG PO TABS: 650.0000 mg | ORAL_TABLET | Freq: Four times a day (QID) | ORAL | Status: DC | PRN

## 2022-09-26 MED ORDER — LORAZEPAM 2 MG/ML IJ SOLN
2.0000 mg | Freq: Once | INTRAMUSCULAR | Status: AC | PRN
  Administered 2022-09-26: 2 mg via INTRAMUSCULAR
  Filled 2022-09-26: qty 1

## 2022-09-26 MED ORDER — LORAZEPAM 1 MG PO TABS: 1.0000 mg | ORAL_TABLET | ORAL | Status: DC | PRN

## 2022-09-26 MED ORDER — OLANZAPINE 10 MG IM SOLR
10.0000 mg | Freq: Once | INTRAMUSCULAR | Status: AC
  Administered 2022-09-26: 10 mg via INTRAMUSCULAR
  Filled 2022-09-26: qty 10

## 2022-09-26 MED ORDER — HYDROXYZINE HCL 25 MG PO TABS
25.0000 mg | ORAL_TABLET | Freq: Three times a day (TID) | ORAL | Status: DC | PRN
  Administered 2022-09-28 – 2022-09-29 (×2): 25 mg via ORAL
  Filled 2022-09-26 (×3): qty 1

## 2022-09-26 MED ORDER — MAGNESIUM HYDROXIDE 400 MG/5ML PO SUSP: 30.0000 mL | Freq: Every day | ORAL | Status: DC | PRN
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
## 2022-09-26 NOTE — ED Notes (Signed)
Pt sleeping sedated at present, Respirations even & unlabored.  Skin color good, no distress noted.  Monitoring for safety.

## 2022-09-26 NOTE — ED Notes (Signed)
[employer] Dis[employer] called to serve IVC papers.  Pt resting at present, MHT at bedside.  Monitoring for safety.

## 2022-09-26 NOTE — ED Provider Notes (Signed)
[facility] Urgent Care Continuous Assessment Admission H&P  Date: 09/26/22 Patient Name: [name] [name] MRN: [id number] Chief Complaint: Psychosis  Diagnoses:  Final diagnoses:  Bipolar disorder, curr episode mixed, severe, with psychotic features [facility])    HPI:  Patient presents to [facility] voluntarily transported by Patent examiner. Per police officer patient visualized stepping into traffic by passerby who telephoned law enforcement.  When law enforcement arrived patient seated in a restaurant parking lot.  Patient requested officer transport to [facility].  [name] [name] is assessed by this nurse practitioner face-to-face.  She is standing in [place] port upon my approach.  She has removed her clothing wearing only undergarments.  She allows staff to assist her with covering with a hospital gown.  Patient is alert and oriented to self and place, she is partially oriented to situation.  She is not oriented to time currently.  She denies physical complaints.  She is minimally cooperative during assessment.  She presents with anxious and agitated mood, labile affect.  [name] [name] states "I just came here so that I can get my shot, I need to go home now!"  Patient is speaking in nonsensical statements, possibly a language other than [place]. Speech rapid and pressured. She presents with agitated mood, labile affect. She is verbally and physically aggressive at this time.  Thought content tangential and scattered.  [name] [name] appears paranoid.  When attempting to walk to assessment area patient states "which room on my going to, are you going to kill me and there?"  Patient is a limited historian at this time.  No suicidal or homicidal ideations reported however she does not actively participate in assessment.  She does not endorse auditory and visual hallucinations.  [name] [name]'s diagnoses include bipolar 1 disorder, polysubstance abuse, bipolar affective disorder, aggressive behavior.  It is  unclear if she is linked with outpatient psychiatry.  She is unable to participate in assessment.  Patient offered support and encouragement.  Reviewed medications including olanzapine, patient states "I would like to take it if it would help with sleep."  Patient visualized with plastic baggy containing what she identifies as marijuana.  Patient reports this is her marijuana and she would like to use it at home.  Marijuana confiscated by security.  Reviewed treatment plan to include involuntary commitment and inpatient psychiatric treatment, patient demonstrates understanding.  She becomes tearful reports that she would like to go to her mother's home.      Total Time spent with patient: 1 hour  Musculoskeletal  Strength & Muscle Tone: within normal limits Gait & Station: normal Patient leans: N/A  Psychiatric Specialty Exam  Presentation General Appearance:  Disheveled; Other (comment) (disrobing in [place] area)  Eye Contact: Minimal  Speech: Pressured  Speech Volume: Increased  Handedness: Right   Mood and Affect  Mood: Labile; Irritable  Affect: Labile; Inappropriate   Thought Process  Thought Processes: Disorganized  Descriptions of Associations:Loose  Orientation:Partial  Thought Content:Tangential; Scattered    Hallucinations:Hallucinations: Other (comment) (none reported)  Ideas of Reference:None  Suicidal Thoughts:Suicidal Thoughts: No  Homicidal Thoughts:Homicidal Thoughts: No   Sensorium  Memory: Immediate Poor  Judgment: Impaired  Insight: Lacking   Executive Functions  Concentration: Poor  Attention Span: Poor  Recall: Poor  Fund of Knowledge: Fair  Language: Fair   Psychomotor Activity  Psychomotor Activity: Psychomotor Activity: Increased   Assets  Assets: Communication Skills   Sleep  Sleep: Sleep: -- (unable to assess)   Nutritional Assessment (For OBS and [facility] admissions  only) Has the  patient had a weight loss or gain of 10 pounds or more in the last 3 months?: -- (unable to assess) Has the patient had a decrease in food intake/or appetite?: -- (unable to assess) Does the patient have dental problems?: -- (unable to assess) Does the patient have eating habits or behaviors that may be indicators of an eating disorder including binging or inducing vomiting?: -- (unable to assess) Has the patient recently lost weight without trying?: -- (unable to assess) Has the patient been eating poorly because of a decreased appetite?: -- (unable to assess)    Physical Exam Vitals and nursing note reviewed.  Constitutional:      Appearance: She is well-developed.  HENT:     Head: Normocephalic and atraumatic.     Nose: Nose normal.  Cardiovascular:     Rate and Rhythm: Normal rate.  Pulmonary:     Effort: Pulmonary effort is normal.  Musculoskeletal:        General: Normal range of motion.     Cervical back: Normal range of motion.  Skin:    General: Skin is warm.  Neurological:     Mental Status: She is alert and oriented to person, place, and time.  Psychiatric:        Attention and Perception: She is inattentive.        Mood and Affect: Mood is anxious. Affect is labile and tearful.        Speech: Speech is rapid and pressured and tangential.        Behavior: Behavior is uncooperative.        Thought Content: Thought content is paranoid.        Judgment: Judgment is impulsive.    Review of Systems  Constitutional: Negative.   HENT: Negative.    Eyes: Negative.   Respiratory: Negative.    Cardiovascular: Negative.   Gastrointestinal: Negative.   Genitourinary: Negative.   Musculoskeletal: Negative.   Skin: Negative.   Neurological: Negative.   Psychiatric/Behavioral:  The patient is nervous/anxious.     not currently breastfeeding. There is no height or weight on file to calculate BMI.  Past Psychiatric History:  bipolar 1 disorder, polysubstance abuse,  bipolar affective disorder, aggressive behavior  Is the patient at risk to self? Yes  Has the patient been a risk to self in the past 6 months? No .    Has the patient been a risk to self within the distant past? Yes   Is the patient a risk to others? Yes   Has the patient been a risk to others in the past 6 months? No   Has the patient been a risk to others within the distant past? No   Past Medical History: hip instability, recurrent UTI, PCOS, headache  Family History: none reported  Social History: unable to assess  Last Labs:  Clinical Support on 06/05/2022  Component Date Value Ref Range Status   Glucose, UA 06/05/2022 NEGATIVE  NEGATIVE mg/dL Final   Bilirubin Urine 06/05/2022 NEGATIVE  NEGATIVE Final   Ketones, ur 06/05/2022 NEGATIVE  NEGATIVE mg/dL Final   Specific Gravity, Urine 06/05/2022 1.015  1.005 - 1.030 Final   Hgb urine dipstick 06/05/2022 NEGATIVE  NEGATIVE Final   pH 06/05/2022 6.0  5.0 - 8.0 Final   Protein, ur 06/05/2022 NEGATIVE  NEGATIVE mg/dL Final   Urobilinogen, UA 06/05/2022 0.2  0.0 - 1.0 mg/dL Final   Nitrite 24/40/1027 NEGATIVE  NEGATIVE Final   Leukocytes,Ua 06/05/2022 NEGATIVE  NEGATIVE Final   Biochemical Testing Only. Please order routine urinalysis from main lab if confirmatory testing is needed.   Preg Test, Ur 06/05/2022 NEGATIVE  NEGATIVE Final   Comment:        THE SENSITIVITY OF THIS METHODOLOGY IS >24 mIU/mL    Neisseria Gonorrhea 06/05/2022 Negative   Final   Chlamydia 06/05/2022 Negative   Final   Trichomonas 06/05/2022 Negative   Final   Bacterial Vaginitis (gardnerella) 06/05/2022 Positive (A)   Final   Candida Vaginitis 06/05/2022 Negative   Final   Candida Glabrata 06/05/2022 Negative   Final   Comment 06/05/2022 Normal Reference Range Bacterial Vaginosis - Negative   Final   Comment 06/05/2022 Normal Reference Range Candida Species - Negative   Final   Comment 06/05/2022 Normal Reference Range Candida Galbrata - Negative    Final   Comment 06/05/2022 Normal Reference Range Trichomonas - Negative   Final   Comment 06/05/2022 Normal Reference Ranger Chlamydia - Negative   Final   Comment 06/05/2022 Normal Reference Range Neisseria Gonorrhea - Negative   Final    Allergies: Ibuprofen  Medications:  Facility Ordered Medications  Medication   [COMPLETED] OLANZapine (ZYPREXA) injection 10 mg   acetaminophen (TYLENOL) tablet 650 mg   alum & mag hydroxide-simeth (MAALOX/MYLANTA) 200-200-20 MG/5ML suspension 30 mL   magnesium hydroxide (MILK OF MAGNESIA) suspension 30 mL   hydrOXYzine (ATARAX) tablet 25 mg   traZODone (DESYREL) tablet 50 mg   PTA Medications  Medication Sig   acetaminophen (TYLENOL) 500 MG tablet Take 2 tablets (1,000 mg total) by mouth every 6 (six) hours. (Patient not taking: Reported on 02/11/2022)   oxyCODONE (OXY IR/ROXICODONE) 5 MG immediate release tablet Take 1-2 tablets (5-10 mg total) by mouth every 4 (four) hours as needed for moderate pain. (Patient not taking: Reported on 02/11/2022)   hydrOXYzine (ATARAX) 25 MG tablet Take 1 tablet (25 mg total) by mouth every 4 (four) hours as needed for anxiety. (Patient not taking: Reported on 01/15/2022)   ibuprofen (ADVIL) 600 MG tablet Take 1 tablet (600 mg total) by mouth every 6 (six) hours as needed. (Patient not taking: Reported on 02/11/2022)   promethazine (PHENERGAN) 25 MG tablet Take 1 tablet (25 mg total) by mouth every 6 (six) hours as needed for nausea or vomiting. (Patient not taking: Reported on 02/11/2022)   lamoTRIgine (LAMICTAL) 25 MG tablet Take 1 tablet (25 mg total) by mouth daily for 14 days, THEN 2 tablets (50 mg total) daily.   mupirocin ointment (BACTROBAN) 2 % Apply 1 Application topically 2 (two) times daily.      Medical Decision Making  Involuntary commitment petition initiated by this Clinical research associate.  Patient aware of treatment plan to include inpatient psychiatric treatment.  Laboratory studies ordered including CBC,  CMP, ethanol, magnesium, and TSH.  Urine pregnancy, urine drug screen ordered.  EKG order initiated.  Current medications: -Acetaminophen 650 mg every 6 as needed/mild pain -Maalox 30 mL oral every 4 as needed/digestion -Hydroxyzine 25 mg 3 times daily as needed/anxiety -Magnesium hydroxide 30 mL daily as needed/mild constipation -Trazodone 50 mg nightly as needed/sleep -NicoDerm 14 mg transdermal patch daily/nicotine withdrawal  Agitation protocol initiated including: -Olanzapine Zydis 10 mg every 8 hours as needed/agitation -Lorazepam 1 mg as needed as needed/anxiety or severe agitation for 1 dose -Ziprasidone 20 mg IM as needed/agitation for 1 dose      Recommendations  Based on my evaluation the patient does not appear to have an emergency medical condition.  Inetta Fermo  Burna Mortimer, FNP 09/26/22  6:52 PM

## 2022-09-26 NOTE — ED Notes (Signed)
Pt becoming increasingly agitated, banging on walls, cursing and screaming at staff, speaking on tongues.  NP Ene Ajibola at bedside to eval pt.  Pt unredirectable.  MHT remains at bedside with pt.  Monitoring for safety.

## 2022-09-27 ENCOUNTER — Encounter (HOSPITAL_COMMUNITY): Payer: Self-pay | Admitting: Registered Nurse

## 2022-09-27 MED ORDER — ZIPRASIDONE MESYLATE 20 MG IM SOLR: 20.0000 mg | Freq: Two times a day (BID) | INTRAMUSCULAR | Status: DC | PRN

## 2022-09-27 MED ORDER — LORAZEPAM 1 MG PO TABS
2.0000 mg | ORAL_TABLET | Freq: Three times a day (TID) | ORAL | Status: DC | PRN
  Administered 2022-09-27: 1 mg via ORAL
  Administered 2022-09-28: 2 mg via ORAL
  Filled 2022-09-27 (×2): qty 2

## 2022-09-27 MED ORDER — LORAZEPAM 2 MG/ML IJ SOLN: 2.0000 mg | Freq: Three times a day (TID) | INTRAMUSCULAR | Status: DC | PRN

## 2022-09-27 NOTE — ED Provider Notes (Addendum)
Behavioral Health Progress Note  Date and Time: 09/27/2022 10:41 AM Name: [name] [name] MRN:  [id number]  Subjective:  Psychosis    [name] [name] 37 y.o., female patient admitted to continuous assessment awaiting appropriate bed for inpatient psychiatric treatment after presenting to [facility] voluntarily via Patent examiner. Per police officer patient visualized stepping into traffic by passerby who telephoned law enforcement.  When law enforcement arrived, patient seated in a restaurant parking lot and requested officer transport to [facility].  Patient seen face to face by this provider, chart reviewed, and consulted with Dr. Jannifer Franklin; and chart reviewed on 09/27/22.  On evaluation [name] [name] is sitting up in a chair with eyes closed.  She states that she wants to go home and that she was not trying to kill herself.  "There were 2 boys trying to cross the street and there was a lot of traffic.  The lady that was standing near them wasn't going to stop them and I didn't want them to get hit so I kind of stood in front of them.  I wasn't standing in the street."  Patient then go on a tangent about wanting to see her mother, "People trying to get information on me.  I was just walking down the road."  Patient has food, soiled clothing laying in the floor of room, and other items thrown all over room.    Patient is alert and oriented to self and place, and somewhat to situation.  She continues to deny any physical complaints and continues to deny suicidal/self-harm/homicidal ideation, psychosis, and paranoia.  She is somewhat cooperative during assessment.  She continues to have periods of agitation.  She appears to be anxious and paranoid.  Thought content is tangential.  Will continue to recommend in patient psychiatric treatment  Diagnosis:  Final diagnoses:  Bipolar disorder, curr episode mixed, severe, with psychotic features ([name])     Total Time spent with patient: 20 minutes  Past Psychiatric History: bipolar 1 disorder, polysubstance abuse, bipolar affective disorder, aggressive behavior  Past Medical History:  Past Medical History:  Diagnosis Date   Anxiety    Bipolar disorder ([name])    Boils    Headache(784.0)    PCOS (polycystic ovarian syndrome) 02/26/2012   Recurrent UTI 05/15/2013   Neg 03/2020  E. Coli   Smoker 01/18/2019   Status post primary low transverse cesarean section 01/06/2022    Family History:  Family History  Problem Relation Age of Onset   Mental illness Mother    Thyroid disease Mother    Asthma Sister    Asthma Brother    Alcohol abuse Brother    Cancer Paternal Aunt    Breast cancer Paternal Aunt    Hypertension Maternal Grandmother    Birth defects Neg Hx    Diabetes Neg Hx    Heart disease Neg Hx    Stroke Neg Hx     Family Psychiatric  History: See above Social History:  Social History   Tobacco Use   Smoking status: Every Day    Current packs/day: 0.25    Types: Cigarettes   Smokeless tobacco: Never  Vaping Use   Vaping status: Former   Substances: Nicotine, THC  Substance Use Topics   Alcohol use: Yes    Comment: "wine cooler a day"   Drug use: Yes    Types: Marijuana, Cocaine    Comment: last used 10/31 cocaine. Marijuana use everyday.  Additional Social History:    Pain Medications: UTA- pt irritable and and relunctant to participate in assessment interview Prescriptions: UTA- pt irritable and and relunctant to participate in assessment interview Over the Counter: UTA- pt irritable and and relunctant to participate in assessment interview History of alcohol / drug use?: Yes (Pt UDS + THC, per chart pt admits to cocaine use) Longest period of sobriety (when/how long): Unk  Negative Consequences of Use: Legal Withdrawal Symptoms:  (denies)     Sleep: Fair  Appetite:  Good  Current Medications:  Current Facility-Administered Medications   Medication Dose Route Frequency Provider Last Rate Last Admin   acetaminophen (TYLENOL) tablet 650 mg  650 mg Oral Q6H PRN Lenard Lance, FNP       alum & mag hydroxide-simeth (MAALOX/MYLANTA) 200-200-20 MG/5ML suspension 30 mL  30 mL Oral Q4H PRN Lenard Lance, FNP       diphenhydrAMINE (BENADRYL) injection 25 mg  25 mg Intramuscular Once Ajibola, Ene A, NP       hydrOXYzine (ATARAX) tablet 25 mg  25 mg Oral TID PRN Lenard Lance, FNP       magnesium hydroxide (MILK OF MAGNESIA) suspension 30 mL  30 mL Oral Daily PRN Lenard Lance, FNP       nicotine (NICODERM CQ - dosed in mg/24 hours) patch 14 mg  14 mg Transdermal Daily Lenard Lance, FNP   14 mg at 09/27/22 0812   OLANZapine zydis (ZYPREXA) disintegrating tablet 10 mg  10 mg Oral Q8H PRN Lenard Lance, FNP   10 mg at 09/27/22 0758   traZODone (DESYREL) tablet 50 mg  50 mg Oral QHS PRN Lenard Lance, FNP       Current Outpatient Medications  Medication Sig Dispense Refill   lamoTRIgine (LAMICTAL) 25 MG tablet Take 1 tablet (25 mg total) by mouth daily for 14 days, THEN 2 tablets (50 mg total) daily. (Patient taking differently: 1 tab bid) 74 tablet 0    Labs  Lab Results:  Clinical Support on 06/05/2022  Component Date Value Ref Range Status   Glucose, UA 06/05/2022 NEGATIVE  NEGATIVE mg/dL Final   Bilirubin Urine 06/05/2022 NEGATIVE  NEGATIVE Final   Ketones, ur 06/05/2022 NEGATIVE  NEGATIVE mg/dL Final   Specific Gravity, Urine 06/05/2022 1.015  1.005 - 1.030 Final   Hgb urine dipstick 06/05/2022 NEGATIVE  NEGATIVE Final   pH 06/05/2022 6.0  5.0 - 8.0 Final   Protein, ur 06/05/2022 NEGATIVE  NEGATIVE mg/dL Final   Urobilinogen, UA 06/05/2022 0.2  0.0 - 1.0 mg/dL Final   Nitrite 16/12/9602 NEGATIVE  NEGATIVE Final   Leukocytes,Ua 06/05/2022 NEGATIVE  NEGATIVE Final   Biochemical Testing Only. Please order routine urinalysis from main lab if confirmatory testing is needed.   Preg Test, Ur 06/05/2022 NEGATIVE  NEGATIVE Final    Comment:        THE SENSITIVITY OF THIS METHODOLOGY IS >24 mIU/mL    Neisseria Gonorrhea 06/05/2022 Negative   Final   Chlamydia 06/05/2022 Negative   Final   Trichomonas 06/05/2022 Negative   Final   Bacterial Vaginitis (gardnerella) 06/05/2022 Positive (A)   Final   Candida Vaginitis 06/05/2022 Negative   Final   Candida Glabrata 06/05/2022 Negative   Final   Comment 06/05/2022 Normal Reference Range Bacterial Vaginosis - Negative   Final   Comment 06/05/2022 Normal Reference Range Candida Species - Negative   Final   Comment 06/05/2022 Normal Reference Range Candida Galbrata - Negative  Final   Comment 06/05/2022 Normal Reference Range Trichomonas - Negative   Final   Comment 06/05/2022 Normal Reference Ranger Chlamydia - Negative   Final   Comment 06/05/2022 Normal Reference Range Neisseria Gonorrhea - Negative   Final    Blood Alcohol level:  Lab Results  Component Value Date   ETH <5 12/15/2015   ETH <5 11/11/2015    Metabolic Disorder Labs: Lab Results  Component Value Date   HGBA1C 5.1 07/24/2021   MPG 105 08/03/2014   No results found for: "PROLACTIN" Lab Results  Component Value Date   CHOL 143 08/03/2014   TRIG 81 08/03/2014   HDL 44 08/03/2014   CHOLHDL 3.3 08/03/2014   VLDL 16 08/03/2014   LDLCALC 83 08/03/2014   LDLCALC 100 (H) 03/17/2012    Therapeutic Lab Levels: No results found for: "LITHIUM" Lab Results  Component Value Date   VALPROATE <10.0 (L) 04/13/2012   VALPROATE 76.1 03/29/2012   No results found for: "CBMZ"  Physical Findings   AIMS    Flowsheet Row [facility]) [facility]  AIMS Total Score 0      AUDIT    Flowsheet Row [facility]) [facility] Ad[facility] ([facility] Ad[facility] (Dis[facility]  Alcohol Use Disorder Identification Test Final Score (AUDIT) 0 2 1      GAD-7    Flowsheet Row Routine [facility] Routine Prenatal from 09/27/2020 in [facility] [employer] [facility] Routine Prenatal from 08/27/2020 in [facility] Routine Prenatal from 08/13/2020 in [facility] Routine Prenatal from 07/02/2020 in [facility]  Total GAD-7 Score 0 0 0 0 2      PHQ2-9    Flowsheet Row Routine Prenatal from 10/01/2020 in [facility] Routine Prenatal from 09/27/2020 in [facility] [employer] [facility] Routine Prenatal from 08/27/2020 in [facility] Routine Prenatal from 08/13/2020 in [facility] [employer] [facility] Routine Prenatal from 07/02/2020 in [facility] [employer] [facility]  PHQ-2 Total Score 0 0 0 0 0  PHQ-9 Total Score 0 0 0 0 2      Flowsheet Row ED from 09/26/2022 in [facility] ED from 08/13/2022 in [facility] Admission (Discharged) from 02/01/2022 in [facility]S Maternity Assessment Unit  C-SSRS RISK CATEGORY No Risk No Risk No Risk        Musculoskeletal  Strength & Muscle Tone: within normal limits Gait & Station: normal Patient leans: N/A  Psychiatric Specialty Exam  Presentation  General Appearance:  Disheveled  Eye Contact: Fair  Speech: Pressured  Speech Volume: Normal  Handedness: Right   Mood and Affect  Mood: Labile  Affect: Labile   Thought Process  Thought Processes: Disorganized; Linear  Descriptions of  Associations:Loose  Orientation:Partial  Thought Content:Tangential  Diagnosis of Schizophrenia or Schizoaffective disorder in past: Yes  Duration of Psychotic Symptoms: Greater than six months   Hallucinations:Hallucinations: None (Denies)  Ideas of Reference:Delusions  Suicidal Thoughts:Suicidal Thoughts: No  Homicidal Thoughts:Homicidal Thoughts: No   Sensorium  Memory: Immediate Poor; Recent Poor; Remote Poor  Judgment: Intact  Insight: Lacking   Executive Functions  Concentration: Poor  Attention Span: Poor  Recall: Poor  Fund of Knowledge: Fair  Language: Fair   Psychomotor Activity  Psychomotor Activity: Psychomotor Activity: Normal   Assets  Assets: Communication Skills   Sleep  Sleep: Sleep: Fair   Nutritional Assessment (For [facility] and [facility] admissions only) Has the patient had a weight loss or gain of 10 pounds or more in the last 3 months?: No Has the patient had a decrease in food intake/or appetite?: No Does the patient have dental problems?: No Does the patient have eating habits or behaviors that may be indicators of an eating disorder including binging or inducing vomiting?: No Has the patient recently lost weight without trying?: 0 Has the patient been eating poorly because of a decreased appetite?: 0 Malnutrition Screening Tool Score: 0    Physical Exam  Physical Exam Vitals and nursing note reviewed.  Constitutional:      General: She is not in acute distress.    Appearance: Normal appearance. She is not ill-appearing.  HENT:     Head: Normocephalic.  Eyes:     Conjunctiva/sclera: Conjunctivae normal.  Cardiovascular:     Rate and Rhythm: Normal rate.  Pulmonary:     Effort: Pulmonary effort is normal.  Musculoskeletal:        General: Normal range of motion.     Cervical back: Normal range of motion.  Skin:    General: Skin is warm and dry.  Neurological:     Mental Status: She is alert and oriented to person,  place, and time.  Psychiatric:        Attention and Perception: She is inattentive.        Mood and Affect: Mood is anxious. Affect is labile.        Behavior: Behavior is uncooperative.        Thought Content: Thought content is paranoid.        Judgment: Judgment is impulsive.    Review of Systems  Constitutional:        No complaints voiced  Psychiatric/Behavioral:  Suicidal ideas: Denies. The patient is nervous/anxious.   All other systems reviewed and are negative.  Blood pressure (!) 103/59, pulse 80, temperature 98 F (36.7 C), temperature source Oral, resp. rate 20, SpO2 100%, not currently breastfeeding. There is no height or weight on file to calculate BMI.  Treatment Plan Summary: Daily contact with patient to assess and evaluate symptoms and progress in treatment, Medication management, and Plan Inpatient psychiatric treatment recommended  Leeza Heiner, NP 09/27/2022 10:41 AM

## 2022-09-27 NOTE — ED Notes (Signed)
Patient took a shower this morning and made a mess in the shower. Now she is going to take a second shower.

## 2022-09-27 NOTE — ED Notes (Signed)
Pt sleeping@this time. Breathing even and unlabored will continue to monitor for safety 

## 2022-09-27 NOTE — ED Notes (Signed)
Pt resting quietly with eyes closed.  No pain or discomfort noted/voiced.  Breathing is even and unlabored.  Will continue to monitor for safety.  

## 2022-09-27 NOTE — ED Notes (Signed)
Pt yelling at staff and throwing chairs around flex unit.  Pt is safe at this time.  PRN medications given.  Verbal de-escalation used and pt was able to be redirected to bed area.  Pt currently crying at this time.  Staff at bed side.  Safety maintained

## 2022-09-27 NOTE — ED Notes (Signed)
Pt has returned to her bed is currently crying.

## 2022-09-27 NOTE — ED Notes (Signed)
Pt is yelling and cursing and hitting on the glass calling the staff names.

## 2022-09-27 NOTE — ED Notes (Signed)
Pt noted crying in chair for some time.  Attempted to address pt to see what was going on.  Pt then at window yelling at staff demanding to leave.  Informed pt we would not be able to discharge at this time do to her being IVC'd.  Pt currently yelling at staff stating " I came here on my own why can't I leave."  Pt continues to yell at staff, during verbal de escalation.

## 2022-09-27 NOTE — ED Notes (Signed)
Pt awake after resting.  She presents calmly to window requesting items to shower.  Hygiene products obtained for pt to shower.  Pt requested dinner.  Dinner provided.  Pt sitting quietly eating at this time.  Informed pt to let staff know when she is ready for a shower.

## 2022-09-27 NOTE — ED Notes (Signed)
Pt calm and cooperative no pain or distress noted will continue to monitor for safety

## 2022-09-27 NOTE — ED Notes (Signed)
Patient alert and oriented.  Denies SI, HI, AVH, and pain. Scheduled medications administered to patient, per MD orders.  Pt refused her nicotine pt.  Support and encouragement provided.  Routine safety checks conducted every hour.  Patient informed to notify staff with problems or concerns. No adverse drug reactions noted. Patient contracts for safety at this time.. Pt is agitated when speaking with this Clinical research associate.  Pt states "Im gonna punch a hole in the wall, you all are making me mad."  Verbal descalation used along side prn medications.  Pt requested to use shower area to take care of ADLs.  Snack and breakfast provided.  Will continue to monitor for safety.

## 2022-09-28 MED ORDER — LAMOTRIGINE 25 MG PO TABS
25.0000 mg | ORAL_TABLET | Freq: Every day | ORAL | Status: DC
  Filled 2022-09-28: qty 1

## 2022-09-28 MED ORDER — LAMOTRIGINE 25 MG PO TABS
25.0000 mg | ORAL_TABLET | Freq: Two times a day (BID) | ORAL | Status: DC
  Administered 2022-09-28 – 2022-09-29 (×3): 25 mg via ORAL
  Filled 2022-09-28 (×2): qty 1

## 2022-09-28 MED ORDER — OLANZAPINE 5 MG PO TBDP
5.0000 mg | ORAL_TABLET | Freq: Two times a day (BID) | ORAL | Status: DC
  Administered 2022-09-28 – 2022-09-29 (×3): 5 mg via ORAL
  Filled 2022-09-28 (×3): qty 1

## 2022-09-28 NOTE — ED Notes (Signed)
Pt sleeping@this time. Breathing even and unlabored. Will continue to monitor for safety 

## 2022-09-28 NOTE — ED Notes (Signed)
Patient Alert and Oriented X 3. She is calm and cooperative.She is heard singing gospel songs and stated she normally attends church on Sundays. She reports sleep and appetite are "good". She denies suicidal or homicidal ideation and AVH. Patient contracted for safety. Safety of environment ensured. Was able to obtain ordered lab work without incidence. Pt was apologetic for how she acted yesterday. She denies any needs at this time. Will continue to monitor for safety.

## 2022-09-28 NOTE — ED Notes (Signed)
Pt tearful, becoming increasingly more agitated.  PRN Meds offered.  Pt took without incident.  Resting at present.  Monitoring for safety.

## 2022-09-28 NOTE — ED Notes (Signed)
Pt observed sitting in bed. Is calm and cooperative. Denies any needs at this moment. Will continue to monitor for safety.

## 2022-09-28 NOTE — ED Notes (Signed)
Pt observed talking on the phone with her mom. She is calm and cooperative. Denies any needs at this moment. Will continue to monitor for safety.

## 2022-09-28 NOTE — ED Notes (Signed)
Pt tearful after phone call. Requesting sleep meds. Instructed that sleep meds can not be given at this time. She voices understanding. Denies SI/HI/AVH. Pt is pleasant and engaged with staff. No noted distress. Will continue to monitor for safety

## 2022-09-28 NOTE — ED Notes (Signed)
[facility] called for specimen pickup

## 2022-09-28 NOTE — ED Notes (Signed)
Spent approximately 10 minutes outside with patient at her request.

## 2022-09-28 NOTE — ED Provider Notes (Addendum)
Behavioral Health Progress Note  Date and Time: 09/28/2022 12:20 PM Name: [name] [name] MRN:  [id number]  Subjective:  "I got help myself because I new my mental health was getting bad'  Diagnosis:  Final diagnoses:  Bipolar disorder, curr episode mixed, severe, with psychotic features ([name])   Total Time spent with patient: 30 minutes  [name] [name] [place], 37 year old, female, initially presented here voluntarily accompanied by law enforcement on 09/26/2022 and was subsequently IVC by admitting provider as patient was behaving in an aggressive manner and actively psychotic requiring agitation medications. Patient has an episode yesterday in which she became verbally agitated and begin to throw a chair while on the unit after learning that she was recommended for inpatient psychiatric treatment. She again required agitation medications yesterday during the afternoon and remained cooperative without any further incidents while on the unit.    On evaluation today, patient is able to express that she has multiple stressors in her life at present. She reports 2 active CPS cases and subsequently signing over temporary custody of her 3 children to their father, in order to get her "mental health" and "finances" together. She endorses since giving birth to her 33 month twins in November, she has had a lot of stress on her do to lack of help. She also reports poor sleep intermittently for months which she attributes to her mental decompensation. She reports that since being here and getting on her medications she feels more calm and that states" I think if I stay on my medications, I'll be okay". Patient continues to deny suicidal, homicidal ideations. She also denies hearing voices or seeing objects or people others are unable to see. Patient able to contract for safety.   Past Psychiatric History: Bipolar 1 Disorder and Polysubstance abuse  Past Medical History:  Past Medical History:   Diagnosis Date   Anxiety    Bipolar disorder ([name])    Boils    Headache(784.0)    PCOS (polycystic ovarian syndrome) 02/26/2012   Recurrent UTI 05/15/2013   Neg 03/2020  E. Coli   Smoker 01/18/2019   Status post primary low transverse cesarean section 01/06/2022     Family History: family history includes Alcohol abuse in her brother; Asthma in her brother and sister; Breast cancer in her paternal aunt; Cancer in her paternal aunt; Hypertension in her maternal grandmother; Mental illness in her mother; Thyroid disease in her mother.   Family Psychiatric  History: See Family History   Social History:  Social History   Socioeconomic History   Marital status: Single    Spouse name: Not on file   Number of children: Not on file   Years of education: Not on file   Highest education level: Not on file  Occupational History   Not on file  Tobacco Use   Smoking status: Every Day    Current packs/day: 0.25    Types: Cigarettes   Smokeless tobacco: Never  Vaping Use   Vaping status: Former   Substances: Nicotine, THC  Substance and Sexual Activity   Alcohol use: Yes    Comment: "wine cooler a day"   Drug use: Yes    Types: Marijuana, Cocaine    Comment: last used 10/31 cocaine. Marijuana use everyday.   Sexual activity: Not Currently    Birth control/protection: None  Other Topics Concern   Not on file  Social History Narrative   Not on file   Social Determinants of Health  Financial Resource Strain: Not on file  Food Insecurity: No Food Insecurity (01/08/2022)   Hunger Vital Sign    Worried About Running Out of Food in the Last Year: Never true    Ran Out of Food in the Last Year: Never true  Transportation Needs: No Transportation Needs (01/08/2022)   PRAPARE - Administrator, Civil Service (Medical): No    Lack of Transportation (Non-Medical): No  Physical Activity: Not on file  Stress: Not on file  Social Connections: Unknown (07/04/2021)   Received  from [facility]   Social Network    Social Network: Not on file  Intimate Partner Violence: Not At Risk (01/08/2022)   Humiliation, Afraid, Rape, and Kick questionnaire    Fear of Current or Ex-Partner: No    Emotionally Abused: No    Physically Abused: No    Sexually Abused: No     Additional Social History:    Pain Medications: UTA- pt irritable and and relunctant to participate in assessment interview Prescriptions: UTA- pt irritable and and relunctant to participate in assessment interview Over the Counter: UTA- pt irritable and and relunctant to participate in assessment interview History of alcohol / drug use?: Yes (Pt UDS + THC, per chart pt admits to cocaine use) Longest period of sobriety (when/how long): Unk  Negative Consequences of Use: Legal Withdrawal Symptoms:  (denies)                    Sleep: Fair  Appetite:  Fair  Current Medications:  Current Facility-Administered Medications  Medication Dose Route Frequency Provider Last Rate Last Admin   acetaminophen (TYLENOL) tablet 650 mg  650 mg Oral Q6H PRN Lenard Lance, FNP       alum & mag hydroxide-simeth (MAALOX/MYLANTA) 200-200-20 MG/5ML suspension 30 mL  30 mL Oral Q4H PRN Lenard Lance, FNP       diphenhydrAMINE (BENADRYL) injection 25 mg  25 mg Intramuscular Once Ajibola, Ene A, NP       hydrOXYzine (ATARAX) tablet 25 mg  25 mg Oral TID PRN Lenard Lance, FNP       lamoTRIgine (LAMICTAL) tablet 25 mg  25 mg Oral BID Bing Neighbors, NP   25 mg at 09/28/22 1217   LORazepam (ATIVAN) tablet 2 mg  2 mg Oral Q8H PRN White, Patrice L, NP   1 mg at 09/27/22 1528   Or   LORazepam (ATIVAN) injection 2 mg  2 mg Intramuscular Q8H PRN White, Patrice L, NP       magnesium hydroxide (MILK OF MAGNESIA) suspension 30 mL  30 mL Oral Daily PRN Lenard Lance, FNP       nicotine (NICODERM CQ - dosed in mg/24 hours) patch 14 mg  14 mg Transdermal Daily Lenard Lance, FNP   14 mg at 09/28/22 0910   OLANZapine zydis  (ZYPREXA) disintegrating tablet 5 mg  5 mg Oral BID Bing Neighbors, NP       traZODone (DESYREL) tablet 50 mg  50 mg Oral QHS PRN Lenard Lance, FNP       ziprasidone (GEODON) injection 20 mg  20 mg Intramuscular Q12H PRN White, Patrice L, NP       Current Outpatient Medications  Medication Sig Dispense Refill   lamoTRIgine (LAMICTAL) 25 MG tablet Take 1 tablet (25 mg total) by mouth daily for 14 days, THEN 2 tablets (50 mg total) daily. (Patient taking differently: 1 tab bid) 74 tablet 0  Labs  Lab Results:  Admission on 09/26/2022  Component Date Value Ref Range Status   WBC 09/28/2022 13.9 (H)  4.0 - 10.5 K/uL Final   RBC 09/28/2022 4.11  3.87 - 5.11 MIL/uL Final   Hemoglobin 09/28/2022 13.5  12.0 - 15.0 g/dL Final   HCT 28/41/3244 40.1  36.0 - 46.0 % Final   MCV 09/28/2022 97.6  80.0 - 100.0 fL Final   MCH 09/28/2022 32.8  26.0 - 34.0 pg Final   MCHC 09/28/2022 33.7  30.0 - 36.0 g/dL Final   RDW 03/05/7251 13.0  11.5 - 15.5 % Final   Platelets 09/28/2022 267  150 - 400 K/uL Final   nRBC 09/28/2022 0.0  0.0 - 0.2 % Final   Neutrophils Relative % 09/28/2022 76  % Final   Neutro Abs 09/28/2022 10.6 (H)  1.7 - 7.7 K/uL Final   Lymphocytes Relative 09/28/2022 13  % Final   Lymphs Abs 09/28/2022 1.8  0.7 - 4.0 K/uL Final   Monocytes Relative 09/28/2022 8  % Final   Monocytes Absolute 09/28/2022 1.1 (H)  0.1 - 1.0 K/uL Final   Eosinophils Relative 09/28/2022 3  % Final   Eosinophils Absolute 09/28/2022 0.5  0.0 - 0.5 K/uL Final   Basophils Relative 09/28/2022 0  % Final   Basophils Absolute 09/28/2022 0.0  0.0 - 0.1 K/uL Final   Immature Granulocytes 09/28/2022 0  % Final   Abs Immature Granulocytes 09/28/2022 0.05  0.00 - 0.07 K/uL Final   Performed at [facility], [address] [place], [place] [address]   Sodium 09/28/2022 137  135 - 145 mmol/L Final   Potassium 09/28/2022 3.7  3.5 - 5.1 mmol/L Final   Chloride 09/28/2022 107  98 - 111 mmol/L Final   CO2  09/28/2022 21 (L)  22 - 32 mmol/L Final   Glucose, Bld 09/28/2022 134 (H)  70 - 99 mg/dL Final   Glucose reference range applies only to samples taken after fasting for at least 8 hours.   BUN 09/28/2022 10  6 - 20 mg/dL Final   Creatinine, Ser 09/28/2022 0.89  0.44 - 1.00 mg/dL Final   Calcium 34/74/2595 9.1  8.9 - 10.3 mg/dL Final   Total Protein 63/87/5643 7.0  6.5 - 8.1 g/dL Final   Albumin 32/95/1884 3.8  3.5 - 5.0 g/dL Final   AST 16/60/6301 31  15 - 41 U/L Final   ALT 09/28/2022 31  0 - 44 U/L Final   Alkaline Phosphatase 09/28/2022 65  38 - 126 U/L Final   Total Bilirubin 09/28/2022 0.6  0.3 - 1.2 mg/dL Final   GFR, Estimated 09/28/2022 >60  >60 mL/min Final   Comment: (NOTE) Calculated using the CKD-EPI Creatinine Equation (2021)    Anion gap 09/28/2022 9  5 - 15 Final   Performed at [facility], [address] [place], [place] [address]   Magnesium 09/28/2022 2.3  1.7 - 2.4 mg/dL Final   Performed at [facility], [address] [place], [place] [address]   Alcohol, Ethyl (B) 09/28/2022 <10  <10 mg/dL Final   Comment: (NOTE) Lowest detectable limit for serum alcohol is 10 mg/dL.  For medical purposes only. Performed at [facility], [address], [place], [place] [address]    TSH 09/28/2022 0.651  0.350 - 4.500 uIU/mL Final   Comment: Performed by a 3rd Generation assay with a functional sensitivity of <=0.01 uIU/mL. Performed at [facility],  [address], [place], [place] [address]    POC Amphetamine UR 09/28/2022 None Detected  NONE DETECTED (Cut Off Level 1000 ng/mL) Final   POC Secobarbital (BAR) 09/28/2022 None Detected  NONE DETECTED (Cut Off Level 300 ng/mL) Final   POC Buprenorphine (BUP) 09/28/2022 None Detected  NONE DETECTED (Cut Off Level 10 ng/mL) Final   POC Oxazepam (BZO) 09/28/2022 Positive (A)  NONE DETECTED (Cut Off Level 300 ng/mL) Final   POC Cocaine UR 09/28/2022 None Detected  NONE DETECTED (Cut Off Level 300 ng/mL)  Final   POC Methamphetamine UR 09/28/2022 None Detected  NONE DETECTED (Cut Off Level 1000 ng/mL) Final   POC Morphine 09/28/2022 None Detected  NONE DETECTED (Cut Off Level 300 ng/mL) Final   POC Methadone UR 09/28/2022 None Detected  NONE DETECTED (Cut Off Level 300 ng/mL) Final   POC Oxycodone UR 09/28/2022 None Detected  NONE DETECTED (Cut Off Level 100 ng/mL) Final   POC Marijuana UR 09/28/2022 Positive (A)  NONE DETECTED (Cut Off Level 50 ng/mL) Final   Preg Test, Ur 09/28/2022 Negative  Negative Final  Clinical Support on 06/05/2022  Component Date Value Ref Range Status   Glucose, UA 06/05/2022 NEGATIVE  NEGATIVE mg/dL Final   Bilirubin Urine 06/05/2022 NEGATIVE  NEGATIVE Final   Ketones, ur 06/05/2022 NEGATIVE  NEGATIVE mg/dL Final   Specific Gravity, Urine 06/05/2022 1.015  1.005 - 1.030 Final   Hgb urine dipstick 06/05/2022 NEGATIVE  NEGATIVE Final   pH 06/05/2022 6.0  5.0 - 8.0 Final   Protein, ur 06/05/2022 NEGATIVE  NEGATIVE mg/dL Final   Urobilinogen, UA 06/05/2022 0.2  0.0 - 1.0 mg/dL Final   Nitrite 60/45/4098 NEGATIVE  NEGATIVE Final   Leukocytes,Ua 06/05/2022 NEGATIVE  NEGATIVE Final   Biochemical Testing Only. Please order routine urinalysis from main lab if confirmatory testing is needed.   Preg Test, Ur 06/05/2022 NEGATIVE  NEGATIVE Final   Comment:        THE SENSITIVITY OF THIS METHODOLOGY IS >24 mIU/mL    Neisseria Gonorrhea 06/05/2022 Negative   Final   Chlamydia 06/05/2022 Negative   Final   Trichomonas 06/05/2022 Negative   Final   Bacterial Vaginitis (gardnerella) 06/05/2022 Positive (A)   Final   Candida Vaginitis 06/05/2022 Negative   Final   Candida Glabrata 06/05/2022 Negative   Final   Comment 06/05/2022 Normal Reference Range Bacterial Vaginosis - Negative   Final   Comment 06/05/2022 Normal Reference Range Candida Species - Negative   Final   Comment 06/05/2022 Normal Reference Range Candida Galbrata - Negative   Final   Comment 06/05/2022 Normal  Reference Range Trichomonas - Negative   Final   Comment 06/05/2022 Normal Reference Ranger Chlamydia - Negative   Final   Comment 06/05/2022 Normal Reference Range Neisseria Gonorrhea - Negative   Final    Blood Alcohol level:  Lab Results  Component Value Date   ETH <10 09/28/2022   ETH <5 12/15/2015    Metabolic Disorder Labs: Lab Results  Component Value Date   HGBA1C 5.1 07/24/2021   MPG 105 08/03/2014   No results found for: "PROLACTIN" Lab Results  Component Value Date   CHOL 143 08/03/2014   TRIG 81 08/03/2014   HDL 44 08/03/2014   CHOLHDL 3.3 08/03/2014   VLDL 16 08/03/2014   LDLCALC 83 08/03/2014   LDLCALC 100 (H) 03/17/2012    Therapeutic Lab Levels: No results found for: "LITHIUM" Lab Results  Component Value Date   VALPROATE <10.0 (L) 04/13/2012  VALPROATE 76.1 03/29/2012   No results found for: "CBMZ"  Physical Findings   AIMS    Flowsheet Row [facility]) [facility]  AIMS Total Score 0      AUDIT    Flowsheet Row [facility]) [facility] Admission (Discharged) [facility] Admission (Discharged) [facility]  Alcohol Use Disorder Identification Test Final Score (AUDIT) 0 2 1      GAD-7    Flowsheet Row [facility] Routine Prenatal from 09/27/2020 in [facility] [employer] [facility] Routine Prenatal from 08/27/2020 in [facility] for [employer] [facility] at [facility] for Women Routine Prenatal from 08/13/2020 in [facility] [employer] [facility] at [facility] for Women Routine Prenatal from 07/02/2020 in [facility] [employer] [facility] at [facility]  Total GAD-7 Score 0 0 0 0 2       PHQ2-9    Flowsheet Row Routine Prenatal from 10/01/2020 in [facility] [employer] [facility] at [facility] Women Routine Prenatal from 09/27/2020 in [facility] [employer] [facility] at [facility] for Women Routine Prenatal from 08/27/2020 in [facility] [employer] [facility] at [facility] for Women Routine Prenatal from 08/13/2020 in [facility] for [employer] [facility] at [facility] for Women Routine Prenatal from 07/02/2020 in [facility] for [employer] at [employer] for Women  PHQ-2 Total Score 0 0 0 0 0  PHQ-9 Total Score 0 0 0 0 2      Flowsheet Row ED from 09/26/2022 in [facility] ED from 08/13/2022 in [facility] Admission (Discharged) from 02/01/2022 in [facility] 1S Maternity Assessment Unit  C-SSRS RISK CATEGORY No Risk No Risk No Risk        Musculoskeletal  Strength & Muscle Tone: within normal limits Gait & Station: normal Patient leans: N/A  Psychiatric Specialty Exam  Presentation  General Appearance:  Fairly Groomed  Eye Contact: Good  Speech: Clear and Coherent  Speech Volume: Normal  Handedness: Right   Mood and Affect  Mood: Labile (euthymic and tearful)  Affect: Tearful; Labile; Appropriate   Thought Process  Thought Processes: Linear  Descriptions of Associations:Intact  Orientation:Full (Time, Place and Person)  Thought Content:WDL  Diagnosis of Schizophrenia or Schizoaffective disorder in past: Yes  Duration of Psychotic Symptoms: Greater than six months   Hallucinations:Hallucinations: None  Ideas of Reference:None  Suicidal Thoughts:Suicidal Thoughts: No  Homicidal Thoughts:Homicidal Thoughts: No   Sensorium  Memory: Immediate Good; Recent Good; Remote Good  Judgment: Fair  Insight: Fair   Art therapist  Concentration: Good  Attention Span: Good  Recall: Good  Fund of  Knowledge: Good  Language: Good   Psychomotor Activity  Psychomotor Activity: Psychomotor Activity: Normal   Assets  Assets: Communication Skills; Desire for Improvement; Physical Health; Housing   Sleep  Sleep: Sleep: Fair Number of Hours of Sleep: 6 (sleeping approximately 6 hours while here at [facility])   Nutritional Assessment (For OBS and FBC admissions only) Has the patient had a weight loss or gain of 10 pounds or more in the last 3 months?: No Has the patient had a decrease in food intake/or appetite?: No Does the patient have dental problems?: No Does the patient have eating  habits or behaviors that may be indicators of an eating disorder including binging or inducing vomiting?: No Has the patient recently lost weight without trying?: 0 Has the patient been eating poorly because of a decreased appetite?: 0 Malnutrition Screening Tool Score: 0    Physical Exam  Physical Exam Vitals and nursing note reviewed.  Constitutional:      Appearance: Normal appearance.  HENT:     Head: Normocephalic and atraumatic.  Eyes:     Extraocular Movements: Extraocular movements intact.     Conjunctiva/sclera: Conjunctivae normal.     Pupils: Pupils are equal, round, and reactive to light.  Cardiovascular:     Rate and Rhythm: Normal rate and regular rhythm.  Pulmonary:     Effort: Pulmonary effort is normal.     Breath sounds: Normal breath sounds.  Musculoskeletal:        General: Normal range of motion.     Cervical back: Normal range of motion and neck supple.  Skin:    General: Skin is warm and dry.  Neurological:     General: No focal deficit present.     Mental Status: She is alert.    Review of Systems  Psychiatric/Behavioral:  Positive for depression and substance abuse. Negative for hallucinations, memory loss and suicidal ideas. The patient is nervous/anxious. The patient does not have insomnia.    Blood pressure (!) 129/92, pulse 86, temperature 98.8 F  (37.1 C), temperature source Oral, resp. rate 18, SpO2 100%, not currently breastfeeding. There is no height or weight on file to calculate BMI.  Treatment Plan Summary: -Continue home medications, Restarted previous home medication regimen: Olanzapine 5 mg increased to 5 mg BID, restarted Lamictal 25 mg twice daily (per patient she has been taking this dose up until the day she was admitted here),  Trazodone 50 mg daily at bedtime PRN, and Hydroxyzine 25 mg TID PRN. -Agitation medications PRN  Daily contact with patient to assess and evaluate symptoms and progress in treatment, Medication management, and  Plan:Tentative Discharge tomorrow, as long as patient remains stable. Patient scheduled to see outpatient provider here at [facility] tomorrow. Will continue IVC for now as patient is tearful that she has remain here overnight, however patient will benefit from additional 24 hours of observation to ensure she is stable for discharge.    Joaquin Courts, FNP-C, PMHNP-BC  Behavioral Health Service Line  [facility] Urgent [phone]  09/28/2022 11:49 AM

## 2022-09-28 NOTE — ED Notes (Signed)
Pt observed sitting in her chair. Snack provided. Pt is calm and cooperative. Denies any needs at this moment. Will continue to monitor for safety.

## 2022-09-28 NOTE — ED Notes (Signed)
Patient is requesting medication for sleep. I advised patient that sleep medication is not able to be given if she has any on file until 9 pm

## 2022-09-29 ENCOUNTER — Encounter (HOSPITAL_COMMUNITY): Payer: Self-pay | Admitting: Registered Nurse

## 2022-09-29 MED ORDER — MENTHOL 3 MG MT LOZG: 1.0000 | LOZENGE | Freq: Three times a day (TID) | OROMUCOSAL | Status: DC | PRN

## 2022-09-29 MED ORDER — MENTHOL 3 MG MT LOZG
1.0000 | LOZENGE | Freq: Three times a day (TID) | OROMUCOSAL | Status: DC | PRN
  Administered 2022-09-29: 3 mg via ORAL
  Filled 2022-09-29: qty 9

## 2022-09-29 MED ORDER — TRAZODONE HCL 50 MG PO TABS: 50.0000 mg | ORAL_TABLET | Freq: Every evening | ORAL | 0 refills | Status: DC | PRN

## 2022-09-29 MED ORDER — LAMOTRIGINE 25 MG PO TABS: 25.0000 mg | ORAL_TABLET | Freq: Two times a day (BID) | ORAL | 0 refills | Status: DC

## 2022-09-29 MED ORDER — OLANZAPINE 5 MG PO TBDP: 5.0000 mg | ORAL_TABLET | Freq: Two times a day (BID) | ORAL | 0 refills | Status: DC

## 2022-09-29 NOTE — ED Notes (Signed)
Patient complaint of throat pain, provider has been notified.

## 2022-09-29 NOTE — ED Notes (Signed)
Walking and pacing but calm.

## 2022-09-29 NOTE — ED Notes (Signed)
Pt awake, asking questions about dc . Staff provided supportive listening. Will continue to monitor for safety

## 2022-09-29 NOTE — Discharge Instructions (Addendum)
Walk in hours for [facility]    [facility]: Outpatient psychiatric Services:   Please see the walk in hours listed below.  Medication Management New Patient needing Medication Management Walk-in, and Existing Patients needing to see a provider for management coming as a walk in   Monday thru Friday 8:00 AM first come first serve until slots are full.  Recommend being there by 7:15 AM to ensure a slot is open.  Therapy New Patient Therapy Intake and Existing Patients needing to see therapist coming in as a walk in.   Monday, Wednesday, and Thursday morning at 8:00 am first come first serve.  Recommend being there by 7:15 AM to ensure a slot is open.    Every 1st, 2nd, and 3rd Friday at 1:00 PM first come first serve until slots are full.  Will still need to come in that morning at 7:15 AM to get registered for an afternoon slot.  For all walk-ins we ask that you arrive by 7:15 am because patients will be seen in there order of arrival (FIRST COME FIRST SERVE) Availability is limited, therefore you may not be seen on the same day that you walk in if all slots are full.    Our goal is to serve and meet the needs of our community to the best of our ability.    [facility] Address: [address] [place], [place], [place] [address] Phone: [phone]  Supported Employment The supported employment program is a person-centered, individualized, evidence-based support service that helps members choose, acquire, and maintain competitive employment in our community. This service supports the varying needs of individuals and promotes community inclusion and employment success. Members enrolled in the supported employment program can expect the following:  Development of an individual career plan Community based job placement Job shadowing Job development On-site job Furniture conservator/restorer and support  Supported Education Supported  education helps our members receive the education and training they need to achieve their learning and recovery goals. This will assist members with becoming gainfully employed in the job or career of their choice. The program includes assistance with: Registering for disability accommodations Enrolling in school and registering for classes Learning communication skills Scheduling tutoring sessions within your school [facility] partners with Vocational Rehabilitation to help increase the success of clients seeking employment and educational goals.  Want to learn more about our programs?   Please contact our intake department INTAKE: [phone] Ext [phone]  Mailing: [address] [address]   [place], [place] [address]   [email or web address]

## 2022-09-29 NOTE — ED Notes (Signed)
Patient A/O, receptive, calm, and cooperative. Denies SI, HI, AVH, and pain. Scheduled medications administered to patient, per MD orders. Support and encouragement provided.  Routine safety checks conducted every hour.  Patient informed to notify staff with problems or concerns. No adverse drug reactions noted. Patient contracts for safety at this time. Patient compliant with medications and treatment plan. Patient remains safe at this time.

## 2022-09-29 NOTE — ED Provider Notes (Signed)
FBC/OBS ASAP Discharge Summary  Date and Time: 09/29/2022 10:22 AM  Name: [name] [name]  MRN:  [id number]   Discharge Diagnoses:  Final diagnoses:  Bipolar disorder, curr episode mixed, severe, with psychotic features ([name])    Subjective: "The lady yesterday said I would be able to go home today"  [name] [name] 37 y.o. female patient seen face to face by this provider, chart reviewed, and consulted with Dr. Nelly Rout on 09/29/22.  On evaluation [name] [name] reports she is feeling better.  Reports she is tolerating medications without adverse reaction and sleeping/eating without difficulty.  She states she already has an appointment set up for medication management "I got an appointment setup up stairs.  I did that before all of this happened and I came in here."  Patient states being set up with outpatient psychiatric services was one of the stipulations by child protective services.  Reporting there is 2 open case with CPS where she had to give custody over to the father of children until she is mentally staple.  Patient states she plans to go to her mothers house after discharge.  Patient denies suicidal/self-harm/homicidal ideation, psychosis, and paranoia.     During evaluation [name] [name] is standing up at the nursing station asking for her morning medications.  She is alert/oriented x 4, calm, cooperative, and attentive.  Her responses were appropriate to assessment questions.  Her mood is euthymic with congruent affect.  She spoke in a clear tone at moderate volume, and normal pace, with good eye contact.   She denies suicidal/self-harm/homicidal ideation, psychosis, and paranoia.  Objectively there is no evidence of psychosis/mania or delusional thinking.  She conversed coherently, with goal directed thoughts, no distractibility, or pre-occupation.  At this time [name] [name] is educated and verbalizes understanding of mental  health resources and other crisis services in the community.  She is instructed to call 911 and present to the nearest emergency room should she experience any suicidal/homicidal ideation, auditory/visual/hallucinations, or detrimental worsening of her mental health condition.  She is encouraged to keep her scheduled appointment with [facility] outpatient.  She was a also advised by Clinical research associate that she could call the toll-free phone on back of Medicaid card to speak with care coordinator  Stay Summary: [name] [name], 37 year old, female who initially presented to [facility] voluntarily via law enforcement on 09/26/2022 with complaints of psychotic and aggressive behavior.  Involuntary commitment petitioned by examining provider and recommended for inpatient psychiatric treatment.  She was admitted to continuous assessment while awaiting appropriate bed for inpatient treatment.  On several occasions [name] [name] required agitation protocol medications when she became verbally aggressive and throwing around furniture with out being able to be deescalated.  During her stay in continuous observation she was evaluated each day by a clinical provider to ascertain response to treatment. Improvement was noted by her report of decreasing symptoms, improved sleep, appetite, affect, medication tolerance, and behavior.   She was asked each day to give verbal self-inventory noting mood, mental status, pain, anxiety, depression, any new symptoms, or concerns. [name] [name] has responded well to medication and being in a therapeutic supportive environment. Positive and appropriate behavior was noted, and she was motivated for recovery.  [name] [name] worked closely with treatment team and case manager to develop a discharge plan with appropriate goals, coping skills, and problem solving.  Her mental stability has improved prior  to receiving an appropriate bed for inpatient psychiatric treatment and was recommended for discharge.   By the day of discharge [name] [name] was in a much-improved condition than upon admission.  Symptoms were reported as significantly decreased or resolved completely.  She denies suicidal/self-harm/homicidal ideation, psychosis, and paranoia.  She was motivated to continue taking medication with a goal of continued improvement in mental health.  Medications treated with during stay:     diphenhydrAMINE  25 mg Intramuscular Once   lamoTRIgine  25 mg Oral BID   nicotine  14 mg Transdermal Daily   OLANZapine zydis  5 mg Oral BID  .   Labs ordered and reviewed:  Lab Orders         CBC with Differential/Platelet         Comprehensive metabolic panel         Magnesium         Ethanol         TSH         POCT Urine Drug Screen - (I-Screen)         POC urine preg, ED    Discharge plan/Follow up:  Follow-up Information     [facility] On 09/30/2022.   Specialty: Urgent Care Why: You are scheduled to see Karel Jarvis, PA [facility]) for medication management on September 30, 2022, at 1:00 PM.  If you need to be seen prior to appointment, please see walk in hours listed below.  If you are unable to make your appointment please call to reschedule. Contact information: [phone] [place] [address] [phone]                   Discharge Instructions      Walk in hours for [facility].    [facility]: Outpatient psychiatric Services:   Please see the walk in hours listed below.  Medication Management New Patient needing Medication Management Walk-in, and Existing Patients needing to see a provider for management coming as a walk in   Monday thru Friday 8:00 AM first come first serve until slots are full.  Recommend being there by 7:15 AM to ensure a slot is open.  Therapy New  Patient Therapy Intake and Existing Patients needing to see therapist coming in as a walk in.   Monday, Wednesday, and Thursday morning at 8:00 am first come first serve.  Recommend being there by 7:15 AM to ensure a slot is open.    Every 1st, 2nd, and 3rd Friday at 1:00 PM first come first serve until slots are full.  Will still need to come in that morning at 7:15 AM to get registered for an afternoon slot.  For all walk-ins we ask that you arrive by 7:15 am because patients will be seen in there order of arrival (FIRST COME FIRST SERVE) Availability is limited, therefore you may not be seen on the same day that you walk in if all slots are full.    Our goal is to serve and meet the needs of our community to the best of our ability.    [facility] Address: [address] [place], [place], [place] [address] Phone: [phone]  Supported Employment The supported employment program is a person-centered, individualized, evidence-based support service that helps members choose, acquire, and maintain competitive employment in our community. This service supports the varying needs of individuals and promotes  community inclusion and employment success. Members enrolled in the supported employment program can expect the following:  Development of an individual career plan Community based job placement Job shadowing Job development On-site job Furniture conservator/restorer and support  Supported Education Supported education helps our members receive the education and training they need to achieve their learning and recovery goals. This will assist members with becoming gainfully employed in the job or career of their choice. The program includes assistance with: Registering for disability accommodations Enrolling in school and registering for classes Learning communication skills Scheduling tutoring sessions within your school [facility] partners with Vocational Rehabilitation to help increase  the success of clients seeking employment and educational goals.  Want to learn more about our programs?   Please contact our intake department INTAKE: [phone] Ext [phone]  Mailing: [address] [address]   [place], [place] [address]   [email or web address]                             Total Time spent with patient: 45 minutes  Past Psychiatric History: schizoaffective disorder bipolar type, substance abuse Past Medical History:  Past Medical History:  Diagnosis Date   Anxiety    Bipolar disorder ([name])    Boils    Headache(784.0)    PCOS (polycystic ovarian syndrome) 02/26/2012   Recurrent UTI 05/15/2013   Neg 03/2020  E. Coli   Smoker 01/18/2019   Status post primary low transverse cesarean section 01/06/2022    Family History:  Family History  Problem Relation Age of Onset   Mental illness Mother    Thyroid disease Mother    Asthma Sister    Asthma Brother    Alcohol abuse Brother    Cancer Paternal Aunt    Breast cancer Paternal Aunt    Hypertension Maternal Grandmother    Birth defects Neg Hx    Diabetes Neg Hx    Heart disease Neg Hx    Stroke Neg Hx     Family Psychiatric History: See above Social History:  Social History   Tobacco Use   Smoking status: Every Day    Current packs/day: 0.25    Types: Cigarettes   Smokeless tobacco: Never  Vaping Use   Vaping status: Former   Substances: Nicotine, THC  Substance Use Topics   Alcohol use: Yes    Comment: "wine cooler a day"   Drug use: Yes    Types: Marijuana, Cocaine    Comment: last used 10/31 cocaine. Marijuana use everyday.    Tobacco Cessation:  A prescription for an FDA-approved tobacco cessation medication was offered at discharge and the patient refused  Current Medications:  Current Facility-Administered Medications  Medication Dose Route Frequency Provider Last Rate Last Admin   acetaminophen (TYLENOL) tablet 650 mg  650 mg Oral Q6H PRN Lenard Lance, FNP       alum & mag  hydroxide-simeth (MAALOX/MYLANTA) 200-200-20 MG/5ML suspension 30 mL  30 mL Oral Q4H PRN Lenard Lance, FNP       diphenhydrAMINE (BENADRYL) injection 25 mg  25 mg Intramuscular Once Ajibola, Ene A, NP       hydrOXYzine (ATARAX) tablet 25 mg  25 mg Oral TID PRN Lenard Lance, FNP   25 mg at 09/29/22 0949   lamoTRIgine (LAMICTAL) tablet 25 mg  25 mg Oral BID Bing Neighbors, NP   25 mg at 09/29/22 0852   LORazepam (ATIVAN) tablet 2 mg  2  mg Oral Q8H PRN Liborio Nixon L, NP   2 mg at 09/28/22 1555   Or   LORazepam (ATIVAN) injection 2 mg  2 mg Intramuscular Q8H PRN White, Patrice L, NP       magnesium hydroxide (MILK OF MAGNESIA) suspension 30 mL  30 mL Oral Daily PRN Lenard Lance, FNP       menthol-cetylpyridinium (CEPACOL) lozenge 3 mg  1 lozenge Oral TID PRN [name], Shalon E, NP   3 mg at 09/29/22 0539   nicotine (NICODERM CQ - dosed in mg/24 hours) patch 14 mg  14 mg Transdermal Daily Lenard Lance, FNP   14 mg at 09/28/22 0910   OLANZapine zydis (ZYPREXA) disintegrating tablet 5 mg  5 mg Oral BID Bing Neighbors, NP   5 mg at 09/29/22 4098   traZODone (DESYREL) tablet 50 mg  50 mg Oral QHS PRN Lenard Lance, FNP   50 mg at 09/28/22 2109   ziprasidone (GEODON) injection 20 mg  20 mg Intramuscular Q12H PRN White, Chrystine Oiler, NP       Current Outpatient Medications  Medication Sig Dispense Refill   lamoTRIgine (LAMICTAL) 25 MG tablet Take 1 tablet (25 mg total) by mouth 2 (two) times daily. 60 tablet 0   menthol-cetylpyridinium (CEPACOL) 3 MG lozenge Take 1 lozenge (3 mg total) by mouth 3 (three) times daily as needed for sore throat.     OLANZapine zydis (ZYPREXA) 5 MG disintegrating tablet Take 1 tablet (5 mg total) by mouth 2 (two) times daily. 60 tablet 0   traZODone (DESYREL) 50 MG tablet Take 1 tablet (50 mg total) by mouth at bedtime as needed for sleep. 30 tablet 0    PTA Medications:  Facility Ordered Medications  Medication   [COMPLETED] OLANZapine (ZYPREXA) injection 10 mg    acetaminophen (TYLENOL) tablet 650 mg   alum & mag hydroxide-simeth (MAALOX/MYLANTA) 200-200-20 MG/5ML suspension 30 mL   magnesium hydroxide (MILK OF MAGNESIA) suspension 30 mL   hydrOXYzine (ATARAX) tablet 25 mg   traZODone (DESYREL) tablet 50 mg   OLANZapine zydis (ZYPREXA) disintegrating tablet 5 mg   And   [COMPLETED] ziprasidone (GEODON) injection 20 mg   nicotine (NICODERM CQ - dosed in mg/24 hours) patch 14 mg   [COMPLETED] LORazepam (ATIVAN) tablet 2 mg   Or   [COMPLETED] LORazepam (ATIVAN) injection 2 mg   diphenhydrAMINE (BENADRYL) injection 25 mg   LORazepam (ATIVAN) tablet 2 mg   Or   LORazepam (ATIVAN) injection 2 mg   ziprasidone (GEODON) injection 20 mg   lamoTRIgine (LAMICTAL) tablet 25 mg   menthol-cetylpyridinium (CEPACOL) lozenge 3 mg   PTA Medications  Medication Sig   lamoTRIgine (LAMICTAL) 25 MG tablet Take 1 tablet (25 mg total) by mouth 2 (two) times daily.   traZODone (DESYREL) 50 MG tablet Take 1 tablet (50 mg total) by mouth at bedtime as needed for sleep.   OLANZapine zydis (ZYPREXA) 5 MG disintegrating tablet Take 1 tablet (5 mg total) by mouth 2 (two) times daily.   menthol-cetylpyridinium (CEPACOL) 3 MG lozenge Take 1 lozenge (3 mg total) by mouth 3 (three) times daily as needed for sore throat.       10/01/2020   11:19 AM 09/27/2020    9:12 AM 08/27/2020   11:09 AM  Depression screen PHQ 2/9  Decreased Interest 0 0 0  Down, Depressed, Hopeless 0 0 0  PHQ - 2 Score 0 0 0  Altered sleeping 0 0 0  Tired,  decreased energy 0 0 0  Change in appetite 0 0 0  Feeling bad or failure about yourself  0 0 0  Trouble concentrating 0 0 0  Moving slowly or fidgety/restless 0 0 0  Suicidal thoughts 0 0 0  PHQ-9 Score 0 0 0    Flowsheet Row ED from 09/26/2022 in [facility] ED from 08/13/2022 in [facility] Admission (Discharged) from 02/01/2022 in [facility] 1S Maternity Assessment Unit  C-SSRS RISK  CATEGORY No Risk No Risk No Risk       Musculoskeletal  Strength & Muscle Tone: within normal limits Gait & Station: normal Patient leans: N/A  Psychiatric Specialty Exam  Presentation  General Appearance:  Appropriate for Environment  Eye Contact: Good  Speech: Clear and Coherent  Speech Volume: Normal  Handedness: Right   Mood and Affect  Mood: Euthymic  Affect: Congruent   Thought Process  Thought Processes: Coherent; Goal Directed  Descriptions of Associations:Intact  Orientation:Full (Time, Place and Person)  Thought Content:Logical; WDL  Diagnosis of Schizophrenia or Schizoaffective disorder in past: Yes  Duration of Psychotic Symptoms: Greater than six months   Hallucinations:Hallucinations: None  Ideas of Reference:None  Suicidal Thoughts:Suicidal Thoughts: No  Homicidal Thoughts:Homicidal Thoughts: No   Sensorium  Memory: Immediate Good; Recent Good; Remote Good  Judgment: Intact  Insight: Present   Executive Functions  Concentration: Good  Attention Span: Good  Recall: Good  Fund of Knowledge: Good  Language: Good   Psychomotor Activity  Psychomotor Activity: Psychomotor Activity: Normal   Assets  Assets: Communication Skills; Desire for Improvement; Physical Health; Resilience; Social Support   Sleep  Sleep: Sleep: Good Number of Hours of Sleep: 6 (sleeping approximately 6 hours while here at [facility])   Nutritional Assessment (For OBS and FBC admissions only) Has the patient had a weight loss or gain of 10 pounds or more in the last 3 months?: No Has the patient had a decrease in food intake/or appetite?: No Does the patient have dental problems?: No Does the patient have eating habits or behaviors that may be indicators of an eating disorder including binging or inducing vomiting?: No Has the patient recently lost weight without trying?: 0 Has the patient been eating poorly because of a decreased  appetite?: 0 Malnutrition Screening Tool Score: 0    Physical Exam  Physical Exam Vitals and nursing note reviewed. Exam conducted with a chaperone present.  Constitutional:      General: She is not in acute distress.    Appearance: Normal appearance. She is not ill-appearing.  HENT:     Head: Normocephalic.  Eyes:     Conjunctiva/sclera: Conjunctivae normal.  Cardiovascular:     Rate and Rhythm: Normal rate.  Pulmonary:     Effort: Pulmonary effort is normal. No respiratory distress.  Musculoskeletal:        General: Normal range of motion.     Cervical back: Normal range of motion.  Skin:    General: Skin is warm and dry.  Neurological:     Mental Status: She is alert and oriented to person, place, and time.  Psychiatric:        Attention and Perception: Attention and perception normal. She does not perceive auditory or visual hallucinations.        Mood and Affect: Mood and affect normal.        Speech: Speech normal.        Behavior: Behavior normal. Behavior is cooperative.  Thought Content: Thought content normal. Thought content is not paranoid or delusional. Thought content does not include homicidal or suicidal ideation.        Cognition and Memory: Cognition normal.        Judgment: Judgment is impulsive.    Review of Systems  Constitutional:        No complaints voiced  Psychiatric/Behavioral:  Negative for memory loss. Depression: Stable. Hallucinations: Denies. Substance abuse: Marijuana. Suicidal ideas: Denies.Nervous/anxious: Stable. Insomnia: Denies.   All other systems reviewed and are negative.  Blood pressure 127/84, pulse 84, temperature 97.8 F (36.6 C), temperature source Oral, resp. rate 16, SpO2 100%, not currently breastfeeding. There is no height or weight on file to calculate BMI.  Demographic Factors:  Unemployed and Black female  Loss Factors: Custody of 3 children  Historical Factors: Impulsivity  Risk Reduction Factors:    Responsible for children under 57 years of age, Sense of responsibility to family, Living with another person, especially a relative, and Positive social support  Continued Clinical Symptoms:  Schizoaffective disorder bipolar type  Cognitive Features That Contribute To Risk:  None    Suicide Risk:  Minimal: No identifiable suicidal ideation.  Patients presenting with no risk factors but with morbid ruminations; may be classified as minimal risk based on the severity of the depressive symptoms  Plan Of Care/Follow-up recommendations:  Other:  Keep scheduled appointment  Disposition: No evidence of imminent risk to self or others at present.   Patient does not meet criteria for psychiatric inpatient admission. Supportive therapy provided about ongoing stressors. Discussed crisis plan, support from social network, calling 911, coming to the Emergency Department, and calling Suicide Hotline.  Zaide Kardell, NP 09/29/2022, 10:22 AM

## 2022-09-29 NOTE — ED Notes (Signed)
Patient is currently on the unit placing trash into the garbage, no distress noted, will continue to monitor patient for safety.

## 2022-09-29 NOTE — ED Notes (Signed)
Pt observed/assessed in recliner sleeping. RR even and unlabored, appearing in no noted distress. Environmental check complete, will continue to monitor for safety 

## 2022-09-30 ENCOUNTER — Encounter (HOSPITAL_COMMUNITY): Payer: MEDICAID | Admitting: Physician Assistant

## 2023-02-23 ENCOUNTER — Inpatient Hospital Stay (HOSPITAL_COMMUNITY)
Admission: AD | Admit: 2023-02-23 | Discharge: 2023-02-23 | Disposition: A | Discharge status: Home / Self Care | Payer: MEDICAID | Attending: Family Medicine | Admitting: Family Medicine

## 2023-02-23 DIAGNOSIS — O926 Galactorrhea: Secondary | ICD-10-CM | POA: Diagnosis present

## 2023-02-23 DIAGNOSIS — Z3202 Encounter for pregnancy test, result negative: Secondary | ICD-10-CM | POA: Diagnosis not present

## 2023-02-23 DIAGNOSIS — N643 Galactorrhea not associated with childbirth: Secondary | ICD-10-CM | POA: Insufficient documentation

## 2023-02-23 DIAGNOSIS — R102 Pelvic and perineal pain: Secondary | ICD-10-CM | POA: Diagnosis present

## 2023-02-23 DIAGNOSIS — N644 Mastodynia: Secondary | ICD-10-CM | POA: Diagnosis present

## 2023-02-23 LAB — POCT PREGNANCY, URINE: Preg Test, Ur: NEGATIVE

## 2023-02-23 NOTE — MAU Note (Signed)
..  [name] [name] is a 37 y.o. at Unknown here in MAU reporting: milk is coming out of her breast and is having abdominal cramping.  Has not had a positive pregnancy test.   Pain score: 4/10 Vitals:   02/23/23 2221  BP: 138/78  Pulse: 71  Resp: 19  Temp: 97.6 F (36.4 C)  SpO2: 100%     FHT:n/a Lab orders placed from triage:  POCT preg test

## 2023-02-23 NOTE — MAU Provider Note (Signed)
Event Date/Time   First Provider Initiated Contact with Patient 02/23/23 2210      S [name] is a 37 y.o. [id number] patient who presents to MAU today with complaint of breasts leaking and abdomen enlarging.  States she has not taken a pregnancy test but wants to see if she is pregnant.  States last two pregnancies were negative pregnancy tests until 2 months into the pregnancy.  Had a C/S in 2023 with removal of remaining tube (other tube was surgically absent).  States "they didn't tie my tubes, they just cut one, and she told me I would still be able to get pregnant".  Is adamant about this.   O There were no vitals taken for this visit. Physical Exam Vitals reviewed.  Constitutional:      General: She is not in acute distress.    Appearance: She is not ill-appearing, toxic-appearing or diaphoretic.  HENT:     Head: Normocephalic.  Cardiovascular:     Rate and Rhythm: Normal rate.  Pulmonary:     Effort: Pulmonary effort is normal.  Neurological:     Mental Status: She is alert.     A Medical screening exam complete Negative Pregnancy test Galactorrhea Possible pseudocyesis  P Discharge from MAU in stable condition Discussed she can call office and make an appointment.  If she wants repeat pregnancy test next week they can do that also.  Discussed there are other things that can cause breasts to leak (states she know that but that this is pregnancy)  Warning signs for worsening condition that would warrant emergency follow-up discussed Patient may return to MAU as needed if pregnant  Aviva Signs, CNM 02/23/2023 10:10 PM

## 2023-03-01 ENCOUNTER — Emergency Department (HOSPITAL_COMMUNITY)
Admission: EM | Admit: 2023-03-01 | Discharge: 2023-03-02 | Disposition: A | Discharge status: Other Acute Inpatient Hospital | Payer: MEDICAID | Attending: Emergency Medicine | Admitting: Emergency Medicine

## 2023-03-01 ENCOUNTER — Encounter (HOSPITAL_COMMUNITY): Payer: Self-pay

## 2023-03-01 DIAGNOSIS — F29 Unspecified psychosis not due to a substance or known physiological condition: Secondary | ICD-10-CM | POA: Diagnosis not present

## 2023-03-01 DIAGNOSIS — F3164 Bipolar disorder, current episode mixed, severe, with psychotic features: Secondary | ICD-10-CM | POA: Diagnosis present

## 2023-03-01 LAB — CBC WITH DIFFERENTIAL/PLATELET
Abs Immature Granulocytes: 0.04 10*3/uL (ref 0.00–0.07)
Basophils Absolute: 0 10*3/uL (ref 0.0–0.1)
Basophils Relative: 0 %
Eosinophils Absolute: 0.3 10*3/uL (ref 0.0–0.5)
Eosinophils Relative: 3 %
HCT: 39.9 % (ref 36.0–46.0)
Hemoglobin: 13.5 g/dL (ref 12.0–15.0)
Immature Granulocytes: 0 %
Lymphocytes Relative: 21 %
Lymphs Abs: 2.3 10*3/uL (ref 0.7–4.0)
MCH: 31.5 pg (ref 26.0–34.0)
MCHC: 33.8 g/dL (ref 30.0–36.0)
MCV: 93.2 fL (ref 80.0–100.0)
Monocytes Absolute: 1.2 10*3/uL — ABNORMAL HIGH (ref 0.1–1.0)
Monocytes Relative: 11 %
Neutro Abs: 7.1 10*3/uL (ref 1.7–7.7)
Neutrophils Relative %: 65 %
Platelets: 280 10*3/uL (ref 150–400)
RBC: 4.28 MIL/uL (ref 3.87–5.11)
RDW: 12.4 % (ref 11.5–15.5)
WBC: 10.9 10*3/uL — ABNORMAL HIGH (ref 4.0–10.5)
nRBC: 0 % (ref 0.0–0.2)

## 2023-03-01 LAB — COMPREHENSIVE METABOLIC PANEL
ALT: 31 U/L (ref 0–44)
AST: 53 U/L — ABNORMAL HIGH (ref 15–41)
Albumin: 4.2 g/dL (ref 3.5–5.0)
Alkaline Phosphatase: 65 U/L (ref 38–126)
Anion gap: 13 (ref 5–15)
BUN: 14 mg/dL (ref 6–20)
CO2: 19 mmol/L — ABNORMAL LOW (ref 22–32)
Calcium: 9.3 mg/dL (ref 8.9–10.3)
Chloride: 105 mmol/L (ref 98–111)
Creatinine, Ser: 0.92 mg/dL (ref 0.44–1.00)
GFR, Estimated: >60 mL/min (ref >60)
Glucose, Bld: 139 mg/dL — ABNORMAL HIGH (ref 70–99)
Potassium: 3.7 mmol/L (ref 3.5–5.1)
Sodium: 137 mmol/L (ref 135–145)
Total Bilirubin: 1.1 mg/dL (ref <1.2)
Total Protein: 7.7 g/dL (ref 6.5–8.1)

## 2023-03-01 LAB — HCG, SERUM, QUALITATIVE: Preg, Serum: NEGATIVE

## 2023-03-01 LAB — SALICYLATE LEVEL: Salicylate Lvl: <7 mg/dL — ABNORMAL LOW (ref 7.0–30.0)

## 2023-03-01 LAB — ACETAMINOPHEN LEVEL: Acetaminophen (Tylenol), Serum: <10 ug/mL — ABNORMAL LOW (ref 10–30)

## 2023-03-01 LAB — CBG MONITORING, ED: Glucose-Capillary: 199 mg/dL — ABNORMAL HIGH (ref 70–99)

## 2023-03-01 LAB — ETHANOL: Alcohol, Ethyl (B): <10 mg/dL (ref <10)

## 2023-03-01 MED ORDER — OLANZAPINE 5 MG PO TBDP
10.0000 mg | ORAL_TABLET | ORAL | Status: AC
  Administered 2023-03-01: 10 mg via ORAL
  Filled 2023-03-01: qty 2

## 2023-03-01 MED ORDER — LAMOTRIGINE 25 MG PO TABS
25.0000 mg | ORAL_TABLET | Freq: Every day | ORAL | Status: DC
  Administered 2023-03-01 – 2023-03-02 (×2): 25 mg via ORAL
  Filled 2023-03-01 (×2): qty 1

## 2023-03-01 MED ORDER — OLANZAPINE 5 MG PO TABS
5.0000 mg | ORAL_TABLET | Freq: Two times a day (BID) | ORAL | Status: DC
  Administered 2023-03-01 – 2023-03-02 (×2): 5 mg via ORAL
  Filled 2023-03-01 (×2): qty 1

## 2023-03-01 NOTE — ED Notes (Signed)
Pt arrives to room 51 sitting up on stretcher and crying uncontrollably.

## 2023-03-01 NOTE — ED Triage Notes (Signed)
Pt BIB [facility] from home for psychotic behavior.  A neighbor called because the pt was outside acting erratic.  On [employer] and EMS arrival, they found her naked and agitated. They discovered her infant twin girls naked in 2 different locations in the house.  [employer] felt the pediactric situation was very unsafe.   EMS gave 5mg  Haldol & 5 mg Versed to sedate the pt.  The pt was calmer and sleepy on arrival but agitated when aroused.   Pt has hx of Bipolar  VSS CBG 114

## 2023-03-01 NOTE — ED Notes (Signed)
IVC paperwork completed. 3 copies attached to clipboard in blue zone, 1 copy in medical records, and original in red folder Case Number [id number] Expires: 01/05

## 2023-03-01 NOTE — Progress Notes (Signed)
[facility] LCSW Progress Note   03/01/2023    4:57 PM  Rasheeda Radu Bobbitt   [id number]   Type of Contact and Topic:  Psychiatric Bed Placement   Pt accepted to [facility]     Patient meets inpatient criteria per Arsenio Loader, NP    The attending provider will be Dr. Robet Leu   Call report to [phone]   Franki Monte, RN @ [facility] ED notified.     Pt scheduled  to arrive at [facility] AFTER 0800.    Damita Dunnings, MSW, LCSW-A  4:59 PM 03/01/2023

## 2023-03-01 NOTE — ED Notes (Signed)
Took a shower and changed into burgundy scrubs

## 2023-03-01 NOTE — ED Notes (Signed)
IVC paperwork in purple on clipboard

## 2023-03-01 NOTE — ED Notes (Signed)
PT walking around unit talking continuously.  Pt encouraged to return to her room and wait for staff to come to her.  Pt returns briefly, bu then walks back out of room.  Pt agitated and manic at this time.  Pt talking on phone loudly, encouraged to return to her room.  Pt not cooperative at this time.

## 2023-03-01 NOTE — Progress Notes (Signed)
Patient has been denied by [facility] due to no appropriate beds available. Patient meets [facility] inpatient criteria per Arsenio Loader, NP. Patient has been faxed out to the following facilities:   [facility] [address] [place] [address] [phone]  [facility] [address] [place] [address] [phone]  [facility] [address] [place] [address] [phone]  [facility] Patient Placement [facility], [place] [phone]  [facility] [address] [place] [address] [phone]  [facility] [address] [place] [address] [phone]  [facility] EFAX [address] [place], [place] [phone]  [facility] [address] [place] [address] [phone]  [facility] [address] [place], [place] [address] [phone]  [facility] [address] [place] [address] [phone]  [facility] [address] [place] [address] [phone]  [facility] [place] [address] [phone]  [facility] [address] [place] [address] [phone]  [facility] [address] [place] [address] [phone]  [facility] [address] [place] [address] [phone]  [facility] [address] [place] [address] [phone]  [facility] [address], [place] [address] [phone]  [facility] [address], [place]  [place] [address] [phone]   Damita Dunnings, MSW, LCSW-A  4:43 PM 03/01/2023
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
## 2023-03-01 NOTE — ED Provider Notes (Signed)
[place] EMERGENCY DEPARTMENT AT [facility] Provider Note   CSN: [id number] Arrival date & time: 03/01/23  1156     History  No chief complaint on file.   [name] [name] is a 37 y.o. female.  36 yo F with a cc of bizarre behavior.  The patient was found naked sweeping the sidewalk.  EMS was called and when they went into her house found it in a state of disrepair and found her children under several layers of debris and blankets.          Home Medications Prior to Admission medications   Medication Sig Start Date End Date Taking? Authorizing Provider  lamoTRIgine (LAMICTAL) 25 MG tablet Take 1 tablet (25 mg total) by mouth 2 (two) times daily. 09/29/22 10/29/22  Rankin, Shuvon B, NP  menthol-cetylpyridinium (CEPACOL) 3 MG lozenge Take 1 lozenge (3 mg total) by mouth 3 (three) times daily as needed for sore throat. 09/29/22   Rankin, Shuvon B, NP  OLANZapine zydis (ZYPREXA) 5 MG disintegrating tablet Take 1 tablet (5 mg total) by mouth 2 (two) times daily. 09/29/22 10/29/22  Rankin, Shuvon B, NP  traZODone (DESYREL) 50 MG tablet Take 1 tablet (50 mg total) by mouth at bedtime as needed for sleep. 09/29/22 10/29/22  Rankin, Shuvon B, NP  benztropine (COGENTIN) 0.5 MG tablet Take 1 tablet (0.5 mg total) by mouth at bedtime. Patient taking differently: Take 0.5 mg by mouth daily.  08/04/14 06/27/19  Thermon Leyland, NP      Allergies    Ibuprofen    Review of Systems   Review of Systems  Physical Exam Updated Vital Signs There were no vitals taken for this visit. Physical Exam Vitals and nursing note reviewed.  Constitutional:      General: She is not in acute distress.    Appearance: She is well-developed. She is not diaphoretic.  HENT:     Head: Normocephalic and atraumatic.  Eyes:     Pupils: Pupils are equal, round, and reactive to light.  Cardiovascular:     Rate and Rhythm: Normal rate and regular rhythm.     Heart sounds: No murmur heard.    No  friction rub. No gallop.  Pulmonary:     Effort: Pulmonary effort is normal.     Breath sounds: No wheezing or rales.  Abdominal:     General: There is no distension.     Palpations: Abdomen is soft.     Tenderness: There is no abdominal tenderness.  Musculoskeletal:        General: No tenderness.     Cervical back: Normal range of motion and neck supple.  Skin:    General: Skin is warm and dry.  Neurological:     Mental Status: She is alert and oriented to person, place, and time.  Psychiatric:        Behavior: Behavior normal.     ED Results / Procedures / Treatments   Labs (all labs ordered are listed, but only abnormal results are displayed) Labs Reviewed  COMPREHENSIVE METABOLIC PANEL - Abnormal; Notable for the following components:      Result Value   CO2 19 (*)    Glucose, Bld 139 (*)    AST 53 (*)    All other components within normal limits  CBC WITH DIFFERENTIAL/PLATELET - Abnormal; Notable for the following components:   WBC 10.9 (*)    Monocytes Absolute 1.2 (*)    All other components within  normal limits  SALICYLATE LEVEL - Abnormal; Notable for the following components:   Salicylate Lvl <7.0 (*)    All other components within normal limits  ACETAMINOPHEN LEVEL - Abnormal; Notable for the following components:   Acetaminophen (Tylenol), Serum <10 (*)    All other components within normal limits  CBG MONITORING, ED - Abnormal; Notable for the following components:   Glucose-Capillary 199 (*)    All other components within normal limits  ETHANOL  HCG, SERUM, QUALITATIVE  RAPID URINE DRUG SCREEN, HOSP PERFORMED    EKG None  Radiology No results found.  Procedures Procedures    Medications Ordered in ED Medications  OLANZapine zydis (ZYPREXA) disintegrating tablet 10 mg (10 mg Oral Given 03/01/23 1353)    ED Course/ Medical Decision Making/ A&P                                 Medical Decision Making Amount and/or Complexity of Data  Reviewed Labs: ordered.  Risk Prescription drug management.   37 yo F with a chief complaints of acute psychosis.  IVC here.  Given some zyprexa with improvement.    Labs without significant electrolyte abnormality.  No acute anemia.  Medically clear for TTS eval.   The patients results and plan were reviewed and discussed.   Any x-rays performed were independently reviewed by myself.   Differential diagnosis were considered with the presenting HPI.  Medications  OLANZapine zydis (ZYPREXA) disintegrating tablet 10 mg (10 mg Oral Given 03/01/23 1353)    There were no vitals filed for this visit.  Final diagnoses:  Psychosis, unspecified psychosis type [facility])    Admission/ observation were discussed with the admitting physician, patient and/or family and they are comfortable with the plan.          Final Clinical Impression(s) / ED Diagnoses Final diagnoses:  Psychosis, unspecified psychosis type [facility])    Rx / DC Orders ED Discharge Orders     None         Melene Plan, DO 03/01/23 1533
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
## 2023-03-01 NOTE — ED Notes (Signed)
Spoke with staffing about getting a Recruitment consultant for pt. Staffing states they have no available staff to send at this time.

## 2023-03-02 MED ORDER — LORAZEPAM 1 MG PO TABS
2.0000 mg | ORAL_TABLET | Freq: Once | ORAL | Status: AC
  Administered 2023-03-02: 2 mg via ORAL
  Filled 2023-03-02: qty 2

## 2023-03-02 NOTE — ED Notes (Signed)
Notified Diplomatic Services operational officer to call for transport for pt transfer to [facility]

## 2023-03-02 NOTE — ED Provider Notes (Signed)
Emergency Medicine Observation Re-evaluation Note  [name] [name] is a 37 y.o. female, seen on rounds today.  Pt initially presented to the ED for complaints of No chief complaint on file. Currently, the patient is ambulating.  Physical Exam  BP (!) 105/48   Pulse 76   Temp 98 F (36.7 C) (Oral)   Resp 16   SpO2 100%  Physical Exam General: No acute distress Cardiac: Normal rate Lungs: No increased work of breathing Psych: Calm  ED Course / MDM  EKG:   I have reviewed the labs performed to date as well as medications administered while in observation.  Recent changes in the last 24 hours include none.  Plan  Current plan is for inpatient placement for treatment of psychosis.  Patient has been accepted to [facility] [place] and likely will be transferred there later today.Rolan Bucco, MD 03/02/23 [phone]

## 2023-03-02 NOTE — ED Notes (Signed)
Patient has been in and out of her room since I got here. She's saying she not a witch. And people think she's crazy but she's not crazy. And she's having trouble going to sleep.

## 2023-03-02 NOTE — ED Notes (Signed)
Pt irritable about being asked to stay in and near her room and to not be walking around in the unit.
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
## 2023-03-02 NOTE — ED Notes (Signed)
Per previous RN, no belongings were brought into ED w/ pt. Pt came in naked.

## 2023-03-02 NOTE — ED Notes (Signed)
Attempted to call report to [facility]

## 2023-03-02 NOTE — ED Notes (Signed)
Pt ambulatory w/ steady gait, calm, cooperative at time of departure. All paperwork given to [facility]. No belongings brought to ED w/ pt.

## 2023-03-16 ENCOUNTER — Telehealth (HOSPITAL_COMMUNITY): Payer: MEDICAID | Admitting: Psychiatry

## 2023-03-16 ENCOUNTER — Encounter (HOSPITAL_COMMUNITY): Payer: Self-pay

## 2023-03-16 NOTE — Progress Notes (Deleted)
 [facility] MD/PA/NP OP Progress Note Virtual Visit via Video Note  I connected with [name] [name] on 03/16/23 at  3:00 PM EST by a video enabled telemedicine application and verified that I am speaking with the correct person using two identifiers.  Location: Patient: Home Provider: Clinic   I discussed the limitations of evaluation and management by telemedicine and the availability of in person appointments. The patient expressed understanding and agreed to proceed.  I provided 45 minutes of non-face-to-face time during this encounter.    03/16/2023 2:55 PM [name] [name]  MRN:  [id number]  Chief Complaint: No chief complaint on file.  HPI: 38 year old female in today for follow-up psychiatric evaluation.  Patient has not been seen in the clinic for over 6 months.  She did present to [facility] on Visit Diagnosis: No diagnosis found.  Past Psychiatric History: ***  Past Medical History:  Past Medical History:  Diagnosis Date   Anxiety    Bipolar disorder ([name])    Boils    Headache(784.0)    PCOS (polycystic ovarian syndrome) 02/26/2012   Recurrent UTI 05/15/2013   Neg 03/2020  E. Coli   Smoker 01/18/2019   Status post primary low transverse cesarean section 01/06/2022    Past Surgical History:  Procedure Laterality Date   CESAREAN SECTION MULTI-GESTATIONAL WITH TUBAL N/A 01/06/2022   Procedure: CESAREAN SECTION MULTI-GESTATIONAL WITH TUBAL;  Surgeon: Lola Donnice HERO, MD;  Location: [facility] LD ORS;  Service: Obstetrics;  Laterality: N/A;   LAPAROSCOPIC UNILATERAL SALPINGECTOMY Left    paratubal cystectomy    Family Psychiatric History: ***  Family History:  Family History  Problem Relation Age of Onset   Mental illness Mother    Thyroid  disease Mother    Asthma Sister    Asthma Brother    Alcohol abuse Brother    Cancer Paternal Aunt    Breast cancer Paternal Aunt    Hypertension Maternal Grandmother    Birth defects Neg Hx    Diabetes Neg Hx     Heart disease Neg Hx    Stroke Neg Hx     Social History:  Social History   Socioeconomic History   Marital status: Single    Spouse name: Not on file   Number of children: Not on file   Years of education: Not on file   Highest education level: Not on file  Occupational History   Not on file  Tobacco Use   Smoking status: Every Day    Current packs/day: 0.25    Types: Cigarettes   Smokeless tobacco: Never  Vaping Use   Vaping status: Former   Substances: Nicotine , THC  Substance and Sexual Activity   Alcohol use: Yes    Comment: wine cooler a day   Drug use: Yes    Types: Marijuana, Cocaine    Comment: last used 10/31 cocaine. Marijuana use everyday.   Sexual activity: Not Currently    Birth control/protection: None  Other Topics Concern   Not on file  Social History Narrative   Not on file   Social Drivers of Health   Financial Resource Strain: Not on file  Food Insecurity: No Food Insecurity (01/08/2022)   Hunger Vital Sign    Worried About Running Out of Food in the Last Year: Never true    Ran Out of Food in the Last Year: Never true  Transportation Needs: No Transportation Needs (01/08/2022)   PRAPARE - Administrator, Civil Service (Medical): No  Lack of Transportation (Non-Medical): No  Physical Activity: Not on file  Stress: Not on file  Social Connections: Unknown (07/04/2021)   [facility]   Social Network    Social Network: Not on file    Allergies:  Allergies  Allergen Reactions   Ibuprofen  Nausea And Vomiting    Stomach upset    Metabolic Disorder Labs: Lab Results  Component Value Date   HGBA1C 5.1 07/24/2021   MPG 105 08/03/2014   No results found for: PROLACTIN Lab Results  Component Value Date   CHOL 143 08/03/2014   TRIG 81 08/03/2014   HDL 44 08/03/2014   CHOLHDL 3.3 08/03/2014   VLDL 16 08/03/2014   LDLCALC 83 08/03/2014   LDLCALC 100 (H) 03/17/2012   Lab Results  Component  Value Date   TSH 0.651 09/28/2022   TSH 1.297 08/03/2014    Therapeutic Level Labs: No results found for: LITHIUM Lab Results  Component Value Date   VALPROATE <10.0 (L) 04/13/2012   VALPROATE 76.1 03/29/2012   No results found for: CBMZ  Current Medications: Current Outpatient Medications  Medication Sig Dispense Refill   lamoTRIgine  (LAMICTAL ) 25 MG tablet Take 1 tablet (25 mg total) by mouth 2 (two) times daily. 60 tablet 0   menthol -cetylpyridinium (CEPACOL) 3 MG lozenge Take 1 lozenge (3 mg total) by mouth 3 (three) times daily as needed for sore throat.     OLANZapine  zydis (ZYPREXA ) 5 MG disintegrating tablet Take 1 tablet (5 mg total) by mouth 2 (two) times daily. 60 tablet 0   traZODone  (DESYREL ) 50 MG tablet Take 1 tablet (50 mg total) by mouth at bedtime as needed for sleep. 30 tablet 0   No current facility-administered medications for this visit.     Musculoskeletal: Strength & Muscle Tone: {desc; muscle tone:32375} Gait & Station: {PE GAIT ED WJUO:77474} Patient leans: {Patient Leans:21022755}  Psychiatric Specialty Exam: Review of Systems  not currently breastfeeding.There is no height or weight on file to calculate BMI.  General Appearance: {Appearance:22683}  Eye Contact:  {BHH EYE CONTACT:22684}  Speech:  {Speech:22685}  Volume:  {Volume (PAA):22686}  Mood:  {BHH MOOD:22306}  Affect:  {Affect (PAA):22687}  Thought Process:  {Thought Process (PAA):22688}  Orientation:  {BHH ORIENTATION (PAA):22689}  Thought Content: {Thought Content:22690}   Suicidal Thoughts:  {ST/HT (PAA):22692}  Homicidal Thoughts:  {ST/HT (PAA):22692}  Memory:  {BHH MEMORY:22881}  Judgement:  {Judgement (PAA):22694}  Insight:  {Insight (PAA):22695}  Psychomotor Activity:  {Psychomotor (PAA):22696}  Concentration:  {Concentration:21399}  Recall:  {BHH GOOD/FAIR/POOR:22877}  Fund of Knowledge: {BHH GOOD/FAIR/POOR:22877}  Language: {BHH GOOD/FAIR/POOR:22877}  Akathisia:  {BHH  YES OR NO:22294}  Handed:  {Handed:22697}  AIMS (if indicated): {Desc; done/not:10129}  Assets:  {Assets (PAA):22698}  ADL's:  {BHH JIO'D:77709}  Cognition: {chl bhh cognition:304700322}  Sleep:  {BHH GOOD/FAIR/POOR:22877}   Screenings: AIMS    Flowsheet Row [facility]) [facility]  AIMS Total Score 0      AUDIT    Flowsheet Row [facility]) [facility] Ad[facility] ([facility]  Alcohol Use Disorder Identification Test Final Score (AUDIT) 0 2 1      GAD-7    Flowsheet Row [facility]ine [facility] Routine Prenatal from 09/27/2020 in [facility] Routine Prenatal from 08/27/2020 in [facility] Routine Prenatal from 08/13/2020 in [facility] Routine Prenatal from 07/02/2020 in [facility] at [facility]  Total GAD-7 Score 0 0 0 0 2      PHQ2-9    Flowsheet Row Routine Prenatal from 10/01/2020 in [facility] Routine Prenatal from 09/27/2020 in [facility] at [facility] Routine Prenatal from 08/27/2020 in [facility] at [facility] Routine Prenatal from 08/13/2020 in [facility] Routine Prenatal from 07/02/2020 in [facility] at [facility]  PHQ-2 Total Score 0 0 0 0 0  PHQ-9 Total Score 0 0 0 0 2      Flowsheet Row ED from 03/01/2023 in [facility] ED from 09/26/2022 in [facility] ED from 08/13/2022 in [facility]  C-SSRS RISK CATEGORY No Risk No Risk No Risk        Assessment and Plan: ***  Collaboration of Care: Collaboration of Care: [facility] OP Collaboration of Rjmz:[id number]}  Patient/Guardian was advised Release of Information must be obtained prior to any record release in order to collaborate their care with an outside provider. Patient/Guardian was advised if they have not already done so to contact the registration department to sign all necessary forms in order for us  to release information regarding their care.   Consent: Patient/Guardian gives verbal consent for treatment and assignment of benefits for services provided during this visit. Patient/Guardian expressed understanding and agreed to proceed.    Zane FORBES Bach, NP 03/16/2023, 2:55 PM

## 2023-04-06 ENCOUNTER — Encounter (HOSPITAL_COMMUNITY): Payer: Self-pay

## 2023-04-06 ENCOUNTER — Ambulatory Visit (HOSPITAL_COMMUNITY): Payer: MEDICAID | Admitting: Psychiatry

## 2023-05-27 ENCOUNTER — Ambulatory Visit (INDEPENDENT_AMBULATORY_CARE_PROVIDER_SITE_OTHER): Payer: MEDICAID | Admitting: Mental Health

## 2023-05-27 DIAGNOSIS — F3164 Bipolar disorder, current episode mixed, severe, with psychotic features: Secondary | ICD-10-CM | POA: Diagnosis not present
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
## 2023-06-22 ENCOUNTER — Telehealth: Payer: Self-pay | Admitting: Family Medicine

## 2023-06-22 NOTE — Telephone Encounter (Signed)
 Patient is having vaginal irritation she says it burns when she urinates etc. She would like to know if there is something she can take over the counter in the mean time until her appointment on Wednesday 4/23.

## 2023-06-23 NOTE — Telephone Encounter (Signed)
 Called pt and informed her that we would need to test her to make sure that we are treating appropriately.  Pt reports understanding with no further questions.   Shakeena Kafer,RN

## 2023-06-24 ENCOUNTER — Ambulatory Visit (INDEPENDENT_AMBULATORY_CARE_PROVIDER_SITE_OTHER): Payer: MEDICAID | Admitting: *Deleted

## 2023-06-24 ENCOUNTER — Other Ambulatory Visit (HOSPITAL_COMMUNITY)
Admission: RE | Admit: 2023-06-24 | Discharge: 2023-06-24 | Disposition: A | Discharge status: Home / Self Care | Payer: MEDICAID | Source: Ambulatory Visit | Attending: Family Medicine | Admitting: Family Medicine

## 2023-06-24 ENCOUNTER — Other Ambulatory Visit: Payer: Self-pay

## 2023-06-24 VITALS — BP 104/68 | HR 72 | Ht 71.0 in | Wt 224.6 lb

## 2023-06-24 DIAGNOSIS — N898 Other specified noninflammatory disorders of vagina: Secondary | ICD-10-CM | POA: Insufficient documentation

## 2023-06-24 DIAGNOSIS — R300 Dysuria: Secondary | ICD-10-CM

## 2023-06-24 LAB — POCT URINALYSIS DIP (DEVICE)
Bilirubin Urine: NEGATIVE
Glucose, UA: NEGATIVE mg/dL
Hgb urine dipstick: NEGATIVE
Ketones, ur: NEGATIVE mg/dL
Nitrite: NEGATIVE
Protein, ur: NEGATIVE mg/dL
Specific Gravity, Urine: >=1.03 (ref 1.005–1.030)
Urobilinogen, UA: 0.2 mg/dL (ref 0.0–1.0)
pH: 6 (ref 5.0–8.0)

## 2023-06-24 MED ORDER — NITROFURANTOIN MONOHYD MACRO 100 MG PO CAPS: 100.0000 mg | ORAL_CAPSULE | Freq: Two times a day (BID) | ORAL | 0 refills | Status: DC

## 2023-06-24 MED ORDER — PHENAZOPYRIDINE HCL 200 MG PO TABS: 200.0000 mg | ORAL_TABLET | Freq: Three times a day (TID) | ORAL | 0 refills | Status: DC | PRN

## 2023-06-24 NOTE — Progress Notes (Signed)
 Pt reports vaginal itching, discharge, irritation and painful urination. Self swab obtained for STI, BV and yeast. Random urinalysis shows small Leukocytes. Urine culture sent. Rx sent in for Macrobid  and Pyridium  per standing orders. Pt was advised she will be notified of test results and any additional treatment needed via Mychart. She voiced understanding.

## 2023-06-25 ENCOUNTER — Encounter: Payer: Self-pay | Admitting: Certified Nurse Midwife

## 2023-06-25 ENCOUNTER — Other Ambulatory Visit: Payer: Self-pay | Admitting: Certified Nurse Midwife

## 2023-06-25 DIAGNOSIS — A5901 Trichomonal vulvovaginitis: Secondary | ICD-10-CM

## 2023-06-25 DIAGNOSIS — B9689 Other specified bacterial agents as the cause of diseases classified elsewhere: Secondary | ICD-10-CM

## 2023-06-25 DIAGNOSIS — B3731 Acute candidiasis of vulva and vagina: Secondary | ICD-10-CM

## 2023-06-25 LAB — CERVICOVAGINAL ANCILLARY ONLY
Bacterial Vaginitis (gardnerella): POSITIVE — AB
Candida Glabrata: NEGATIVE
Candida Vaginitis: POSITIVE — AB
Chlamydia: NEGATIVE
Comment: NEGATIVE
Comment: NEGATIVE
Comment: NEGATIVE
Comment: NEGATIVE
Comment: NEGATIVE
Comment: NORMAL
Neisseria Gonorrhea: NEGATIVE
Trichomonas: POSITIVE — AB

## 2023-06-25 MED ORDER — METRONIDAZOLE 500 MG PO TABS: 2000.0000 mg | ORAL_TABLET | Freq: Once | ORAL | 0 refills | Status: AC

## 2023-06-25 MED ORDER — FLUCONAZOLE 150 MG PO TABS
150.0000 mg | ORAL_TABLET | Freq: Every day | ORAL | 1 refills | Status: AC
Start: 2023-06-25 — End: ?

## 2023-06-26 LAB — URINE CULTURE

## 2023-06-29 ENCOUNTER — Other Ambulatory Visit: Payer: Self-pay

## 2023-06-29 MED ORDER — METRONIDAZOLE 500 MG PO TABS: 500.0000 mg | ORAL_TABLET | Freq: Two times a day (BID) | ORAL | 0 refills | Status: DC

## 2023-08-14 ENCOUNTER — Other Ambulatory Visit (HOSPITAL_COMMUNITY)
Admission: RE | Admit: 2023-08-14 | Discharge: 2023-08-14 | Disposition: A | Discharge status: Home / Self Care | Payer: MEDICAID | Source: Ambulatory Visit | Attending: Family Medicine | Admitting: Family Medicine

## 2023-08-14 ENCOUNTER — Other Ambulatory Visit: Payer: Self-pay

## 2023-08-14 ENCOUNTER — Ambulatory Visit: Payer: MEDICAID | Admitting: Family Medicine

## 2023-08-14 VITALS — BP 124/83 | HR 124 | Wt 220.7 lb

## 2023-08-14 DIAGNOSIS — A599 Trichomoniasis, unspecified: Secondary | ICD-10-CM | POA: Diagnosis not present

## 2023-08-14 DIAGNOSIS — Z01419 Encounter for gynecological examination (general) (routine) without abnormal findings: Secondary | ICD-10-CM | POA: Diagnosis not present

## 2023-08-14 DIAGNOSIS — R1033 Periumbilical pain: Secondary | ICD-10-CM

## 2023-08-14 DIAGNOSIS — Z113 Encounter for screening for infections with a predominantly sexual mode of transmission: Secondary | ICD-10-CM | POA: Insufficient documentation

## 2023-08-14 DIAGNOSIS — Z3202 Encounter for pregnancy test, result negative: Secondary | ICD-10-CM

## 2023-08-14 DIAGNOSIS — Z9851 Tubal ligation status: Secondary | ICD-10-CM

## 2023-08-14 DIAGNOSIS — Z720 Tobacco use: Secondary | ICD-10-CM

## 2023-08-14 DIAGNOSIS — F3164 Bipolar disorder, current episode mixed, severe, with psychotic features: Secondary | ICD-10-CM | POA: Diagnosis not present

## 2023-08-14 DIAGNOSIS — K429 Umbilical hernia without obstruction or gangrene: Secondary | ICD-10-CM

## 2023-08-14 LAB — POCT PREGNANCY, URINE: Preg Test, Ur: NEGATIVE

## 2023-08-14 NOTE — Assessment & Plan Note (Signed)
 Symptoms secondary to umbilical hernia. Discussed likely related to prior ectopic surgery. No alarm signs but patient very bothered by it and would prefer to get it repaired. Ordered for CT A/P and referred to [facility] for evaluation.

## 2023-08-14 NOTE — Assessment & Plan Note (Signed)
 Ongoing symptoms, TOC today

## 2023-08-14 NOTE — Assessment & Plan Note (Signed)
 Cancer screening: - Pap: up to date - Mammogram: n/a - Colonoscopy: n/a  Contraception: S/p tubal ligation. Period has been a little irregular with intermenstrual bleeding, outside of that has been regular, no menopausal symptoms, bleeding possibly due to trich infection.   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

## 2023-08-14 NOTE — Assessment & Plan Note (Signed)
 [id number]

## 2023-08-14 NOTE — Progress Notes (Signed)
 GYNECOLOGY OFFICE VISIT NOTE  History:   [name] [name] is a 38 y.o. [phone] here today for annual wellness visit.  For the past two months feels like something has been pulling at her belly button, only with sneezing or coughing, feels like a snatching sensation Had a pLTCS and BTL in 01/06/2022 Feels similar to sensations she's had when pregnant, so she's a little worried its that Would like to do a pregnancy test  Also worried about trichomonas infection from last month Still having symptoms, would like to repeat swab Took treatment last month   Health Maintenance Due  Topic Date Due   HPV VACCINES (1 - 3-dose series) Never done   Pneumococcal Vaccine 53-3 Years old (1 of 2 - PCV) Never done   COVID-19 Vaccine (3 - 2024-25 season) 11/02/2022    Past Medical History:  Diagnosis Date   Anxiety    Bipolar disorder ([name])    Boils    Headache(784.0)    PCOS (polycystic ovarian syndrome) 02/26/2012   Recurrent UTI 05/15/2013   Neg 03/2020  E. Coli   Smoker 01/18/2019   Status post primary low transverse cesarean section 01/06/2022    Past Surgical History:  Procedure Laterality Date   CESAREAN SECTION MULTI-GESTATIONAL WITH TUBAL N/A 01/06/2022   Procedure: CESAREAN SECTION MULTI-GESTATIONAL WITH TUBAL;  Surgeon: Teena Feast, MD;  Location: [facility] LD ORS;  Service: Obstetrics;  Laterality: N/A;   LAPAROSCOPIC UNILATERAL SALPINGECTOMY Left    paratubal cystectomy    The following portions of the patient's history were reviewed and updated as appropriate: allergies, current medications, past family history, past medical history, past social history, past surgical history and problem list.   Health Maintenance:   Last pap: Result Date Procedure Results Follow-ups  05/25/2019 Cytology - PAP Pap Smear: NILM HPV: HRHPV - HPV testing in 5 years: due 05/24/2024 Pap in 5 years: due 05/24/2024   Up to date  Last mammogram:  N/a    Review of Systems:   Pertinent items noted in HPI and remainder of comprehensive ROS otherwise negative.  Physical Exam:  BP (!) 106/90   Pulse 79   Wt 220 lb 11.2 oz (100.1 kg)   LMP 08/05/2023 (Approximate)   Breastfeeding No   BMI 30.78 kg/m  CONSTITUTIONAL: Well-developed, well-nourished female in no acute distress.  HEENT:  Normocephalic, atraumatic. External right and left ear normal. No scleral icterus.  NECK: Normal range of motion, supple, no masses noted on observation SKIN: No rash noted. Not diaphoretic. No erythema. No pallor. MUSCULOSKELETAL: Normal range of motion. No edema noted. NEUROLOGIC: Alert and oriented to person, place, and time. Normal muscle tone coordination.  PSYCHIATRIC: Normal mood and affect. Normal behavior. Normal judgment and thought content. RESPIRATORY: Effort normal, no problems with respiration noted ABDOMEN: small periumbilical fascial defect palpated, approximately 1-2 cm in length horizontally PELVIC: Deferred  Labs and Imaging Results for orders placed or performed in visit on 08/14/23 (from the past week)  Pregnancy, urine POC   Collection Time: 08/14/23 10:15 AM  Result Value Ref Range   Preg Test, Ur NEGATIVE NEGATIVE   No results found.    Assessment and Plan:   Problem List Items Addressed This Visit       Other   Bipolar disorder, curr episode mixed, severe, with psychotic features ([name])   S/P tubal ligation   UPT negative      Tobacco use   Trichomoniasis   Ongoing symptoms, TOC today  Umbilical hernia without obstruction and without gangrene   Symptoms secondary to umbilical hernia. Discussed likely related to prior ectopic surgery. No alarm signs but patient very bothered by it and would prefer to get it repaired. Ordered for CT A/P and referred to [facility] for evaluation.       Relevant Orders   CT ABDOMEN PELVIS WO CONTRAST   Ambulatory referral to General Surgery   Well woman exam - Primary   Cancer  screening: - Pap: up to date - Mammogram: n/a - Colonoscopy: n/a  Contraception: S/p tubal ligation. Period has been a little irregular with intermenstrual bleeding, outside of that has been regular, no menopausal symptoms, bleeding possibly due to trich infection.   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     Periumbilical abdominal pain       Relevant Orders   Pregnancy, urine POC (Completed)       Routine preventative health maintenance measures emphasized. Please refer to After Visit Summary for other counseling recommendations.   Return in 1 year (on 08/13/2024) for Annual Wellness Visit.    Total face-to-face time with patient: 30 minutes.  Over 50% of encounter was spent on counseling and coordination of care.   Teena Feast, MD/MPH Attending Family Medicine Physician, [facility], [facility]

## 2023-08-14 NOTE — Patient Instructions (Signed)

## 2023-08-17 ENCOUNTER — Ambulatory Visit: Payer: Self-pay | Admitting: Family Medicine

## 2023-08-17 LAB — CERVICOVAGINAL ANCILLARY ONLY
Bacterial Vaginitis (gardnerella): POSITIVE — AB
Candida Glabrata: NEGATIVE
Candida Vaginitis: NEGATIVE
Chlamydia: NEGATIVE
Comment: NEGATIVE
Comment: NEGATIVE
Comment: NEGATIVE
Comment: NEGATIVE
Comment: NEGATIVE
Comment: NORMAL
Neisseria Gonorrhea: NEGATIVE
Trichomonas: NEGATIVE

## 2023-08-17 MED ORDER — METRONIDAZOLE 0.75 % VA GEL: 1.0000 | Freq: Every day | VAGINAL | 0 refills | Status: AC

## 2023-08-18 LAB — RPR+HBSAG+HCVAB+...
HIV Screen 4th Generation wRfx: NONREACTIVE
Hep C Virus Ab: NONREACTIVE
Hepatitis B Surface Ag: NEGATIVE
RPR Ser Ql: NONREACTIVE

## 2023-09-07 ENCOUNTER — Ambulatory Visit (HOSPITAL_COMMUNITY): Payer: MEDICAID | Admitting: Physician Assistant

## 2023-09-07 VITALS — BP 122/83 | HR 73 | Ht 71.0 in | Wt 213.4 lb

## 2023-09-07 DIAGNOSIS — G479 Sleep disorder, unspecified: Secondary | ICD-10-CM | POA: Diagnosis not present

## 2023-09-07 DIAGNOSIS — F3164 Bipolar disorder, current episode mixed, severe, with psychotic features: Secondary | ICD-10-CM | POA: Diagnosis not present

## 2023-09-07 DIAGNOSIS — F411 Generalized anxiety disorder: Secondary | ICD-10-CM

## 2023-09-07 MED ORDER — OLANZAPINE 5 MG PO TABS: 5.0000 mg | ORAL_TABLET | Freq: Two times a day (BID) | ORAL | 2 refills | Status: AC

## 2023-09-07 MED ORDER — LAMOTRIGINE 25 MG PO TABS: 25.0000 mg | ORAL_TABLET | Freq: Every day | ORAL | 0 refills | Status: DC

## 2023-09-07 MED ORDER — LAMOTRIGINE 25 MG PO TABS: 25.0000 mg | ORAL_TABLET | Freq: Two times a day (BID) | ORAL | 2 refills | Status: AC

## 2023-09-07 MED ORDER — TRAZODONE HCL 100 MG PO TABS
100.0000 mg | ORAL_TABLET | Freq: Every evening | ORAL | 2 refills | Status: AC | PRN
Start: 2023-09-07 — End: ?

## 2023-09-07 MED ORDER — HYDROXYZINE HCL 50 MG PO TABS: 50.0000 mg | ORAL_TABLET | Freq: Every day | ORAL | 2 refills | Status: AC | PRN

## 2023-09-07 NOTE — Progress Notes (Signed)
 Psychiatric Initial Adult Assessment   Patient Identification: [name] [name] MRN:  [id number] Date of Evaluation:  09/07/2023 Referral Source: Walk-in Chief Complaint:   Chief Complaint  Patient presents with   Establish Care   Medication Management   Visit Diagnosis:    ICD-10-CM   1. Bipolar disorder, curr episode mixed, severe, with psychotic features ([name])  F31.64 lamoTRIgine  (LAMICTAL ) 25 MG tablet    lamoTRIgine  (LAMICTAL ) 25 MG tablet    OLANZapine  (ZYPREXA ) 5 MG tablet    2. Generalized anxiety disorder  F41.1 hydrOXYzine  (ATARAX ) 50 MG tablet    3. Sleep disturbances  G47.9 traZODone  (DESYREL ) 100 MG tablet      History of Present Illness:    [name] [name] is a 38 year old female with a past psychiatric history significant for bipolar disorder who presents to [facility] to establish psychiatric care for medication management.  Patient presents to this facility stating that she has ran out of her psychiatric medications.  She reports that she was recently hospitalized in January at [facility].  After being discharged from the hospital, patient reports that she has been having difficulty getting her medications regularly.  Patient states that she was previously being managed on the following psychiatric medications:  Olanzapine  5 mg 2 times daily Hydroxyzine  50 mg daily as needed Lamotrigine  25 mg 2 times daily Trazodone  100 mg at bedtime as needed  Patient reports that her previously prescribed medications were helpful in managing her symptoms.  Patient reports that she is unsure of her diagnosis but states that when she was last hospitalized at [facility] she was diagnosed with bipolar disorder.  Patient reports that she has been diagnosed with schizoaffective disorder in the past but does not remember when the diagnosis was given to her.  Patient  endorses minimal depression.  She does endorse anxiety characterized by excessive worrying and racing thoughts.  She rates her anxiety a 6-7 out of 10.  Despite her elevated anxiety, patient denies any new stressors at this time.  Patient further denies experiencing recent episodes of mania.  Patient denies auditory or visual hallucinations nor does she endorse changes in her behavior.  Patient denies paranoia at this time.  Patient endorses a past history of hospitalization due to mental health.  As mentioned before, patient was recently hospitalized at [facility].  Patient denies a past history of suicide attempt.  A PHQ-9 screen was performed with the patient scoring a 0.  A GAD-7 screen was also performed with the patient scoring a 7.  Patient is alert and oriented x 4, calm, cooperative, and fully engaged in conversation during the encounter.  Patient endorses relaxed mood.  Patient exhibits euthymic mood with anxious affect.  Patient denies suicidal or homicidal ideations.  She further denies auditory or visual hallucinations and does not appear to be responding to internal/external stimuli.  Patient denies paranoia or delusional thoughts.  Patient endorses fair sleep and receives on average 6 to 7 hours of sleep per night.  Patient endorses good appetite and eats on average 3 meals per day.  Patient denies alcohol consumption or illicit drug use.  Patient endorses tobacco use and smokes on average 3 to 4 cigarettes/day.  Associated Signs/Symptoms: Depression Symptoms:  psychomotor agitation, anxiety, loss of energy/fatigue, (Hypo) Manic Symptoms:  Distractibility, Flight of Ideas, Anxiety Symptoms:  Excessive Worry, Obsessive Compulsive Symptoms:   Cleaning, Social Anxiety, Psychotic Symptoms:  Patient denies PTSD Symptoms: Had a traumatic exposure:  Patient reports that she has traumatic experiences in the past involving her kids being sick. Had a traumatic  exposure in the last month:  N/A Re-experiencing:  None Hypervigilance:  No Hyperarousal:  None Avoidance:  None  Past Psychiatric History:  Patient endorses a past psychiatric history significant for bipolar disorder. Patient reports that she has also been diagnosed with schizoaffective disorder in the past. - Patient was seen at [facility] from 09/26/2022 to 09/29/2022.  During her admission, patient was given a diagnosis of bipolar disorder (current episode mixed, severe, with psychotic features).  Patient reports that she was most recently admitted to [facility]. - Per chart review, patient was seen at [facility] ED due to psychosis on 03/01/2023.  Patient was accepted and transferred to [facility]  Patient denies a past history of suicide attempt.  Patient denies a past history of homicide attempt.  Previous Psychotropic Medications: Yes , patient has been on the following psychiatric medications: Seroquel , Depakote , Zyprexa , Zoloft, Geodon , Thorazine, Lamictal , Lexapro , and Cogentin   Substance Abuse History in the last 12 months:  Yes.    Consequences of Substance Abuse: Patient reports that she has used cocaine, marijuana, and alcohol.  Medical Consequences:  Patient denies Legal Consequences:  Patient denies Family Consequences:  Patient denies Blackouts:  Patient reports that she has experienced blackouts before DT's: Patient denies Withdrawal Symptoms:   None  Past Medical History:  Past Medical History:  Diagnosis Date   Anxiety    Bipolar disorder ([name])    Boils    Headache(784.0)    PCOS (polycystic ovarian syndrome) 02/26/2012   Recurrent UTI 05/15/2013   Neg 03/2020  E. Coli   Smoker 01/18/2019   Status post primary low transverse cesarean section 01/06/2022    Past Surgical History:  Procedure Laterality Date   CESAREAN SECTION MULTI-GESTATIONAL WITH TUBAL N/A 01/06/2022   Procedure: CESAREAN SECTION  MULTI-GESTATIONAL WITH TUBAL;  Surgeon: Lola Donnice HERO, MD;  Location: [facility] LD ORS;  Service: Obstetrics;  Laterality: N/A;   LAPAROSCOPIC UNILATERAL SALPINGECTOMY Left    paratubal cystectomy    Family Psychiatric History:  Patient denies family history of psychiatric illness  Per chart review: Mother - psychiatric hospitalization and unknown diagnosis  Family history of suicide attempt: Patient denies Family history of homicide attempt: Patient denies Family history of substance abuse: Patient denies.  Per chart review multiple members of her family have used THC.  Family History:  Family History  Problem Relation Age of Onset   Mental illness Mother    Thyroid  disease Mother    Asthma Sister    Asthma Brother    Alcohol abuse Brother    Cancer Paternal Aunt    Breast cancer Paternal Aunt    Hypertension Maternal Grandmother    Birth defects Neg Hx    Diabetes Neg Hx    Heart disease Neg Hx    Stroke Neg Hx     Social History:   Social History   Socioeconomic History   Marital status: Single    Spouse name: Not on file   Number of children: Not on file   Years of education: Not on file   Highest education level: Not on file  Occupational History   Not on file  Tobacco Use   Smoking status: Every Day    Current packs/day: 0.25    Types: Cigarettes   Smokeless tobacco: Never  Vaping  Use   Vaping status: Former   Substances: Nicotine , THC  Substance and Sexual Activity   Alcohol use: Yes    Comment: wine cooler a day   Drug use: Yes    Types: Marijuana, Cocaine    Comment: last used 10/31 cocaine. Marijuana use everyday.   Sexual activity: Not Currently    Birth control/protection: None  Other Topics Concern   Not on file  Social History Narrative   Not on file   Social Drivers of Health   Financial Resource Strain: Not on file  Food Insecurity: No Food Insecurity (01/08/2022)   Hunger Vital Sign    Worried About Running Out of Food in the Last  Year: Never true    Ran Out of Food in the Last Year: Never true  Transportation Needs: No Transportation Needs (01/08/2022)   PRAPARE - Administrator, Civil Service (Medical): No    Lack of Transportation (Non-Medical): No  Physical Activity: Not on file  Stress: Not on file  Social Connections: Unknown (07/04/2021)   Received from [facility]   Social Network    Social Network: Not on file    Additional Social History:  Patient endorses social support.  Patient has 3 children of her own.  Patient endorses housing.  Patient is currently employed.  Patient denies a past history of military experience.  Patient reports that she has been to prison and was there for 24 months.  Patient has completed some college courses and has earned a Higher education careers adviser.  Patient denies access to weapons.  Allergies:   Allergies  Allergen Reactions   Ibuprofen  Nausea And Vomiting    Stomach upset    Metabolic Disorder Labs: Lab Results  Component Value Date   HGBA1C 5.1 07/24/2021   MPG 105 08/03/2014   No results found for: PROLACTIN Lab Results  Component Value Date   CHOL 143 08/03/2014   TRIG 81 08/03/2014   HDL 44 08/03/2014   CHOLHDL 3.3 08/03/2014   VLDL 16 08/03/2014   LDLCALC 83 08/03/2014   LDLCALC 100 (H) 03/17/2012   Lab Results  Component Value Date   TSH 0.651 09/28/2022    Therapeutic Level Labs: No results found for: LITHIUM No results found for: CBMZ Lab Results  Component Value Date   VALPROATE <10.0 (L) 04/13/2012    Current Medications: Current Outpatient Medications  Medication Sig Dispense Refill   lamoTRIgine  (LAMICTAL ) 25 MG tablet Take 1 tablet (25 mg total) by mouth daily. 14 tablet 0   hydrOXYzine  (ATARAX ) 50 MG tablet Take 1 tablet (50 mg total) by mouth daily as needed. 30 tablet 2   [START ON 09/21/2023] lamoTRIgine  (LAMICTAL ) 25 MG tablet Take 1 tablet (25 mg total) by mouth 2 (two) times daily. 30 tablet 2   OLANZapine  (ZYPREXA )  5 MG tablet Take 1 tablet (5 mg total) by mouth in the morning and at bedtime. 60 tablet 2   phenazopyridine  (PYRIDIUM ) 200 MG tablet Take 1 tablet (200 mg total) by mouth 3 (three) times daily as needed for pain. 6 tablet 0   traZODone  (DESYREL ) 100 MG tablet Take 1 tablet (100 mg total) by mouth at bedtime as needed for sleep. 30 tablet 2   No current facility-administered medications for this visit.    Musculoskeletal: Strength & Muscle Tone: within normal limits Gait & Station: normal Patient leans: N/A  Psychiatric Specialty Exam: Review of Systems  Psychiatric/Behavioral:  Positive for sleep disturbance. Negative for decreased concentration, dysphoric mood,  hallucinations, self-injury and suicidal ideas. The patient is nervous/anxious. The patient is not hyperactive.     Blood pressure 122/83, pulse 73, height 5' 11 (1.803 m), weight 213 lb 6.4 oz (96.8 kg), last menstrual period 08/05/2023, SpO2 99%, not currently breastfeeding.Body mass index is 29.76 kg/m.  General Appearance: Casual  Eye Contact:  Good  Speech:  Clear and Coherent and Normal Rate  Volume:  Normal  Mood:  Anxious  Affect:  Congruent  Thought Process:  Coherent, Goal Directed, and Descriptions of Associations: Intact  Orientation:  Full (Time, Place, and Person)  Thought Content:  WDL  Suicidal Thoughts:  No  Homicidal Thoughts:  No  Memory:  Immediate;   Good Recent;   Good Remote;   Fair  Judgement:  Good  Insight:  Good  Psychomotor Activity:  Normal  Concentration:  Concentration: Good and Attention Span: Good  Recall:  Good  Fund of Knowledge:Good  Language: Good  Akathisia:  No  Handed:  Right  AIMS (if indicated):  done; 0  Assets:  Communication Skills Desire for Improvement Financial Resources/Insurance Housing Physical Health Social Support Transportation Vocational/Educational  ADL's:  Intact  Cognition: WNL  Sleep:  Fair   Screenings: AIMS    Flowsheet Row Office Visit  from 09/07/2023 in [facility]  AIMS Total Score 0 0   AUDIT    Flowsheet Row Ad[facility]) [facility] (Discharged[facility] Ad[facility] (Discharged[facility]  Alcohol Use Disorder Identification Test Final Score (AUDIT) 0 2 1   GAD-7    Flowsheet Row Office Visit from 09/07/2023 in [facility] Office Visit from [facility]20[facility] Routine Prenatal from 10/01/2020 in [facility] Routine Prenatal from 09/27/2020 in [facility] Routine Prenatal from 08/27/2020 in [facility] Women  Total GAD-7 Score 7 0 0 0 0   PHQ2-9    Flowsheet Row Office Visit from 09/07/2023 in [facility] Office Visit from 08/14/2023 in [facility] for Women Counselor from 05/27/2023 in [facility] Routine Prenatal from 10/01/2020 in [facility] Routine Prenatal from 09/27/2020 in [facility] at [facility] Women  PHQ-2 Total Score 0 0 0 0 0  PHQ-9 Total Score -- 0 2 0 0   Flowsheet Row Office Visit from 09/07/2023 in [facility] Counselor from 05/27/2023 in [facility] ED from 03/01/2023 in [facility] at [facility]  C-SSRS RISK CATEGORY No Risk No Risk No Risk    Assessment and Plan:   [name] [name] is a 38 year old female with a  past psychiatric history significant for bipolar disorder who presents to [facility] to establish psychiatric care for medication management.  Patient presents to the encounter requesting refills on her previously prescribed medications.  Patient was recently hospitalized at [facility] in January and was discharged on the following psychiatric medications:  Olanzapine  5 mg 2 times daily  Hydroxyzine  50 mg daily as needed Lamotrigine  25 mg 2 times daily Trazodone  100 mg at bedtime as needed  Patient endorses minimal depression but states that she has been experiencing worsening anxiety characterized by excessive worrying and racing thoughts.  A PHQ-9 screen was performed with the patient scoring a 0.  A GAD-7 screen was also performed with the patient scoring a 7.  She denies experiencing any manic symptoms at this time and further denies auditory/visual hallucinations, paranoia, or changes in her behavior.  Patient reports that her medications were effective in managing her symptoms and would like to be placed back on her previously prescribed psychiatric medications.  An aims assessment was performed with the patient scoring of 0.  Provider to place patient on olanzapine  5 mg 2 times daily for mood stability.  Patient to be placed back on hydroxyzine  50 mg daily as needed for the management of her anxiety.  Patient to also be placed on trazodone  100 mg at bedtime as needed for the management of her sleep.  Lastly, patient being placed on lamotrigine  25 mg for 14 days, followed by 25 mg 2 times daily for mood stability.  Patient was agreeable to recommendations.  Patient's medications to be e-prescribed to pharmacy of choice.  A [place] Suicide Severity Rating Scale was performed with the patient being considered no risk.  Patient denies suicidal ideations and is able to contract for safety following the conclusion of the  encounter.  Collaboration of Care: Medication Management AEB provider managing patient's psychiatric medications and Psychiatrist AEB patient being followed by a mental health provider at this facility  Patient/Guardian was advised Release of Information must be obtained prior to any record release in order to collaborate their care with an outside provider. Patient/Guardian was advised if they have not already done so to contact the registration department to sign all necessary forms in order for us  to release information regarding their care.   Consent: Patient/Guardian gives verbal consent for treatment and assignment of benefits for services provided during this visit. Patient/Guardian expressed understanding and agreed to proceed.   1. Bipolar disorder, curr episode mixed, severe, with psychotic features ([name]) (Primary)  - lamoTRIgine  (LAMICTAL ) 25 MG tablet; Take 1 tablet (25 mg total) by mouth 2 (two) times daily.  Dispense: 30 tablet; Refill: 2 - lamoTRIgine  (LAMICTAL ) 25 MG tablet; Take 1 tablet (25 mg total) by mouth daily.  Dispense: 14 tablet; Refill: 0 - OLANZapine  (ZYPREXA ) 5 MG tablet; Take 1 tablet (5 mg total) by mouth in the morning and at bedtime.  Dispense: 60 tablet; Refill: 2  2. Generalized anxiety disorder  - hydrOXYzine  (ATARAX ) 50 MG tablet; Take 1 tablet (50 mg total) by mouth daily as needed.  Dispense: 30 tablet; Refill: 2  3. Sleep disturbances  - traZODone  (DESYREL ) 100 MG tablet; Take 1 tablet (100 mg total) by mouth at bedtime as needed for sleep.  Dispense: 30 tablet; Refill: 2  Patient to follow up with Marlo Masson, MD on 10/22/2023 Provider spent a total of 45 minutes with the patient/reviewing the patient's chart  Reginia FORBES Bolster, PA 09/07/2023, 8:00 AM

## 2023-09-08 ENCOUNTER — Encounter (HOSPITAL_COMMUNITY): Payer: Self-pay | Admitting: Physician Assistant

## 2023-10-21 NOTE — Progress Notes (Deleted)
 BH MD Outpatient Progress Note  10/21/2023 5:36 PM [name] [name]  MRN:  [id number]  Assessment:  [name] [name] presents for follow-up evaluation in-person on 10/21/23 .   The patient has the working diagnoses of ***  Chart Review Recent encounters since last visit: *** Recent Labs/Imaging since last visit: ***  Identifying Information: [name] [name] is a 38 y.o. female with a history of *** who is an established patient with [facility] for management of ***. Initial evaluation by this provider completed on ***. For a comprehensive history and detailed assessment, please refer to the initial adult assessment.  The patient's PMHx is significant for ***.   Plan:  # *** Past medication trials:  Status of problem: *** Interventions: -- ***  # *** Past medication trials:  Status of problem: *** Interventions: -- ***  # *** Past medication trials:  Status of problem: *** Interventions: -- ***  Patient was given contact information for behavioral health clinic and was instructed to call 911 for emergencies.   Subjective:  Chief Complaint: No chief complaint on file.   Interval History:  ***  During the patient's previous visit, *** was discussed, which they report ***.  AEs to medications: Medication compliance (missing doses, taking as directed):  Sleep: Appetite: Caffeine: Recent substance use: SI: Impact on functioning: ***   Visit Diagnosis: No diagnosis found.  *** PASTE HX FROM INITIAL ENCOUNTER HERE ***  Social History   Socioeconomic History   Marital status: Single    Spouse name: Not on file   Number of children: Not on file   Years of education: Not on file   Highest education level: Not on file  Occupational History   Not on file  Tobacco Use   Smoking status: Every Day    Current packs/day: 0.25    Types: Cigarettes   Smokeless tobacco: Never  Vaping Use   Vaping status:  Former   Substances: Nicotine , THC  Substance and Sexual Activity   Alcohol use: Yes    Comment: wine cooler a day   Drug use: Yes    Types: Marijuana, Cocaine    Comment: last used 10/31 cocaine. Marijuana use everyday.   Sexual activity: Not Currently    Birth control/protection: None  Other Topics Concern   Not on file  Social History Narrative   Not on file   Social Drivers of Health   Financial Resource Strain: Not on file  Food Insecurity: No Food Insecurity (01/08/2022)   Hunger Vital Sign    Worried About Running Out of Food in the Last Year: Never true    Ran Out of Food in the Last Year: Never true  Transportation Needs: No Transportation Needs (01/08/2022)   PRAPARE - Administrator, Civil Service (Medical): No    Lack of Transportation (Non-Medical): No  Physical Activity: Not on file  Stress: Not on file  Social Connections: Unknown (07/04/2021)   Received from [facility]   Social Network    Social Network: Not on file    Allergies:  Allergies  Allergen Reactions   Ibuprofen  Nausea And Vomiting    Stomach upset    Current Medications: Current Outpatient Medications  Medication Sig Dispense Refill   hydrOXYzine  (ATARAX ) 50 MG tablet Take 1 tablet (50 mg total) by mouth daily as needed. 30 tablet 2   lamoTRIgine  (LAMICTAL ) 25 MG tablet Take 1 tablet (25 mg total) by mouth 2 (two) times daily.  30 tablet 2   lamoTRIgine  (LAMICTAL ) 25 MG tablet Take 1 tablet (25 mg total) by mouth daily. 14 tablet 0   OLANZapine  (ZYPREXA ) 5 MG tablet Take 1 tablet (5 mg total) by mouth in the morning and at bedtime. 60 tablet 2   phenazopyridine  (PYRIDIUM ) 200 MG tablet Take 1 tablet (200 mg total) by mouth 3 (three) times daily as needed for pain. 6 tablet 0   traZODone  (DESYREL ) 100 MG tablet Take 1 tablet (100 mg total) by mouth at bedtime as needed for sleep. 30 tablet 2   No current facility-administered medications for this visit.    ROS: Review of  Systems  Objective:  Objective: Psychiatric Specialty Exam: General Appearance: Casual, fairly groomed  Eye Contact:  Good    Speech:  Clear, coherent, normal rate, spontaneous  Volume:  Normal   Mood:  see above  Affect:  Appropriate, congruent, full range  Thought Content: Logical, rumination  ***  Suicidal Thoughts: see subjective  Thought Process:  Coherent, goal-directed, circumstantial ***  Orientation:  A&Ox4   Memory:  Immediate good  Judgment:  Fair   Insight:  Fair***  Concentration:  Attention and concentration good   Recall:  Good  Fund of Knowledge: Good  Language: Good, fluent  Psychomotor Activity: Normal  Akathisia:  NA   AIMS (if indicated): NA   Assets:   {Assets (PAA):22698}  ADL's:  Intact  Cognition: WNL  Sleep: see above  Appetite: see above    Physical Exam   Metabolic Disorder Labs: Lab Results  Component Value Date   HGBA1C 5.1 07/24/2021   MPG 105 08/03/2014   No results found for: PROLACTIN Lab Results  Component Value Date   CHOL 143 08/03/2014   TRIG 81 08/03/2014   HDL 44 08/03/2014   CHOLHDL 3.3 08/03/2014   VLDL 16 08/03/2014   LDLCALC 83 08/03/2014   LDLCALC 100 (H) 03/17/2012   Lab Results  Component Value Date   TSH 0.651 09/28/2022   TSH 1.297 08/03/2014    Therapeutic Level Labs: No results found for: LITHIUM Lab Results  Component Value Date   VALPROATE <10.0 (L) 04/13/2012   VALPROATE 76.1 03/29/2012   No results found for: CBMZ  Screenings:  AIMS    Flowsheet Row Office Visit from 09/07/2023 in [facility]  AIMS Total Score 0 0   AUDIT    Flowsheet Row [facility] ([facility]  Alcohol Use Disorder Identification Test Final Score (AUDIT) 0 2 1   GAD-7    Flowsheet Row Office Visit from 09/07/2023 in [facility] Office Visit [facility] Routine Prenatal from 10/01/2020 in [facility] Women Routine Prenatal from 09/27/2020 in [facility] for Women Routine Prenatal from 08/27/2020 in [facility] Women  Total GAD-7 Score 7 0 0 0 0   PHQ2-9    Flowsheet Row Office Visit from 09/07/2023 in [facility] Office Visit from 08/14/2023 in [facility] for Women Counselor from 05/27/2023 in [facility]  Routine Prenatal from 10/01/2020 in [facility] Women Routine Prenatal from 09/27/2020 in [facility] for Women  PHQ-2 Total Score 0 0 0 0 0  PHQ-9 Total Score -- 0 2 0 0   Flowsheet Row Office Visit from 09/07/2023 in [facility] Counselor from 05/27/2023 in [facility] ED from 03/01/2023 in [facility] Emergency Department at [facility]  C-SSRS RISK CATEGORY No Risk No Risk No Risk    Collaboration of Care: Collaboration of Care: [facility] OP Collaboration of Rjmz:[id number]}  Patient/Guardian was advised Release of Information must be obtained prior to any record release in order to collaborate their care with an outside provider. Patient/Guardian was advised if they have not already done so to contact the registration department to sign all necessary forms in order for us  to release information regarding their care.   Consent: Patient/Guardian gives verbal consent for treatment and  assignment of benefits for services provided during this visit. Patient/Guardian expressed understanding and agreed to proceed.    Marlo Masson, MD 10/21/2023, 5:36 PM

## 2023-10-22 ENCOUNTER — Encounter (HOSPITAL_COMMUNITY): Payer: MEDICAID | Admitting: Student in an Organized Health Care Education/Training Program

## 2023-11-20 ENCOUNTER — Emergency Department (HOSPITAL_COMMUNITY)
Admission: EM | Admit: 2023-11-20 | Discharge: 2023-11-20 | Disposition: A | Discharge status: Home / Self Care | Payer: MEDICAID | Attending: Emergency Medicine | Admitting: Emergency Medicine

## 2023-11-20 ENCOUNTER — Encounter (HOSPITAL_COMMUNITY): Payer: Self-pay

## 2023-11-20 DIAGNOSIS — F17210 Nicotine dependence, cigarettes, uncomplicated: Secondary | ICD-10-CM | POA: Insufficient documentation

## 2023-11-20 DIAGNOSIS — F19959 Other psychoactive substance use, unspecified with psychoactive substance-induced psychotic disorder, unspecified: Secondary | ICD-10-CM

## 2023-11-20 DIAGNOSIS — F1420 Cocaine dependence, uncomplicated: Secondary | ICD-10-CM | POA: Diagnosis not present

## 2023-11-20 DIAGNOSIS — F29 Unspecified psychosis not due to a substance or known physiological condition: Secondary | ICD-10-CM

## 2023-11-20 DIAGNOSIS — F1520 Other stimulant dependence, uncomplicated: Secondary | ICD-10-CM | POA: Diagnosis not present

## 2023-11-20 DIAGNOSIS — F1920 Other psychoactive substance dependence, uncomplicated: Secondary | ICD-10-CM

## 2023-11-20 LAB — URINALYSIS, ROUTINE W REFLEX MICROSCOPIC
Bilirubin Urine: NEGATIVE
Glucose, UA: NEGATIVE mg/dL
Hgb urine dipstick: NEGATIVE
Ketones, ur: 5 mg/dL — AB
Leukocytes,Ua: NEGATIVE
Nitrite: NEGATIVE
Protein, ur: 30 mg/dL — AB
Specific Gravity, Urine: 1.025 (ref 1.005–1.030)
pH: 5 (ref 5.0–8.0)

## 2023-11-20 LAB — RAPID URINE DRUG SCREEN, HOSP PERFORMED
Amphetamines: POSITIVE — AB
Barbiturates: NOT DETECTED
Benzodiazepines: POSITIVE — AB
Cocaine: POSITIVE — AB
Opiates: NOT DETECTED
Tetrahydrocannabinol: POSITIVE — AB

## 2023-11-20 LAB — COMPREHENSIVE METABOLIC PANEL WITH GFR
ALT: 30 U/L (ref 0–44)
AST: 30 U/L (ref 15–41)
Albumin: 4.4 g/dL (ref 3.5–5.0)
Alkaline Phosphatase: 82 U/L (ref 38–126)
Anion gap: 10 (ref 5–15)
BUN: 10 mg/dL (ref 6–20)
CO2: 22 mmol/L (ref 22–32)
Calcium: 9.4 mg/dL (ref 8.9–10.3)
Chloride: 107 mmol/L (ref 98–111)
Creatinine, Ser: 0.95 mg/dL (ref 0.44–1.00)
GFR, Estimated: >60 mL/min (ref >60)
Glucose, Bld: 123 mg/dL — ABNORMAL HIGH (ref 70–99)
Potassium: 3.7 mmol/L (ref 3.5–5.1)
Sodium: 139 mmol/L (ref 135–145)
Total Bilirubin: 0.9 mg/dL (ref 0.0–1.2)
Total Protein: 7.9 g/dL (ref 6.5–8.1)

## 2023-11-20 LAB — CBC WITH DIFFERENTIAL/PLATELET
Abs Immature Granulocytes: 0.08 K/uL — ABNORMAL HIGH (ref 0.00–0.07)
Basophils Absolute: 0 K/uL (ref 0.0–0.1)
Basophils Relative: 0 %
Eosinophils Absolute: 0.2 K/uL (ref 0.0–0.5)
Eosinophils Relative: 1 %
HCT: 39.8 % (ref 36.0–46.0)
Hemoglobin: 13.5 g/dL (ref 12.0–15.0)
Immature Granulocytes: 1 %
Lymphocytes Relative: 11 %
Lymphs Abs: 1.8 K/uL (ref 0.7–4.0)
MCH: 32.2 pg (ref 26.0–34.0)
MCHC: 33.9 g/dL (ref 30.0–36.0)
MCV: 95 fL (ref 80.0–100.0)
Monocytes Absolute: 1.3 K/uL — ABNORMAL HIGH (ref 0.1–1.0)
Monocytes Relative: 8 %
Neutro Abs: 13.4 K/uL — ABNORMAL HIGH (ref 1.7–7.7)
Neutrophils Relative %: 79 %
Platelets: 332 K/uL (ref 150–400)
RBC: 4.19 MIL/uL (ref 3.87–5.11)
RDW: 12.8 % (ref 11.5–15.5)
WBC: 16.8 K/uL — ABNORMAL HIGH (ref 4.0–10.5)
nRBC: 0 % (ref 0.0–0.2)

## 2023-11-20 LAB — SALICYLATE LEVEL: Salicylate Lvl: <7 mg/dL — ABNORMAL LOW (ref 7.0–30.0)

## 2023-11-20 LAB — PREGNANCY, URINE: Preg Test, Ur: NEGATIVE

## 2023-11-20 LAB — ACETAMINOPHEN LEVEL: Acetaminophen (Tylenol), Serum: <10 ug/mL — ABNORMAL LOW (ref 10–30)

## 2023-11-20 MED ORDER — OLANZAPINE 5 MG PO TABS
5.0000 mg | ORAL_TABLET | Freq: Two times a day (BID) | ORAL | Status: DC
  Administered 2023-11-20: 5 mg via ORAL
  Filled 2023-11-20: qty 1

## 2023-11-20 MED ORDER — STERILE WATER FOR INJECTION IJ SOLN
INTRAMUSCULAR | Status: AC
  Filled 2023-11-20: qty 10

## 2023-11-20 MED ORDER — ACETAMINOPHEN 325 MG PO TABS
325.0000 mg | ORAL_TABLET | Freq: Once | ORAL | Status: AC
  Administered 2023-11-20: 325 mg via ORAL
  Filled 2023-11-20: qty 1

## 2023-11-20 MED ORDER — TRAZODONE HCL 100 MG PO TABS: 100.0000 mg | ORAL_TABLET | Freq: Every evening | ORAL | Status: DC | PRN

## 2023-11-20 MED ORDER — ZIPRASIDONE MESYLATE 20 MG IM SOLR
20.0000 mg | Freq: Once | INTRAMUSCULAR | Status: DC
Start: 2023-11-20 — End: 2023-11-21
  Filled 2023-11-20: qty 20

## 2023-11-20 MED ORDER — HYDROXYZINE HCL 50 MG PO TABS
50.0000 mg | ORAL_TABLET | Freq: Every day | ORAL | Status: DC | PRN
  Administered 2023-11-20: 50 mg via ORAL
  Filled 2023-11-20: qty 1

## 2023-11-20 MED ORDER — LAMOTRIGINE 25 MG PO TABS
25.0000 mg | ORAL_TABLET | Freq: Two times a day (BID) | ORAL | Status: DC
  Administered 2023-11-20: 25 mg via ORAL
  Filled 2023-11-20: qty 1

## 2023-11-20 NOTE — ED Notes (Signed)
 Pt calmed after EMS meds in route. Pt is calm and crying but cooperative with hospital staff. No restraint placed at this time as long as pt continues to cooperate in a fashion that allows the patient and staff to remain safe.

## 2023-11-20 NOTE — ED Provider Notes (Signed)
 [place] EMERGENCY DEPARTMENT AT [facility] Provider Note   CSN: [id number] Arrival date & time: 11/20/23  0533     History No chief complaint on file.   HPI [name] [name] is a 38 y.o. female presenting for chief complaint of psychosis. She was running through the street naked and ems/police were called. She proceeded to retreat into her vehicle and pulled out a gun on EMS and police. Was disarmed by police. History of polysubstance abuse and bipolar disorder.  Patient's recorded medical, surgical, social, medication list and allergies were reviewed in the Snapshot window as part of the initial history.   Review of Systems   Review of Systems  Unable to perform ROS: Psychiatric disorder    Physical Exam Updated Vital Signs There were no vitals taken for this visit. Physical Exam Constitutional:      General: She is not in acute distress.    Appearance: She is not ill-appearing or toxic-appearing.  HENT:     Head: Normocephalic and atraumatic.  Eyes:     Extraocular Movements: Extraocular movements intact.     Pupils: Pupils are equal, round, and reactive to light.  Cardiovascular:     Rate and Rhythm: Normal rate.  Pulmonary:     Effort: No respiratory distress.  Abdominal:     General: Abdomen is flat.  Musculoskeletal:        General: No swelling, deformity or signs of injury.     Cervical back: Normal range of motion. No rigidity.  Skin:    General: Skin is warm and dry.  Neurological:     General: No focal deficit present.     Mental Status: She is alert and oriented to person, place, and time.  Psychiatric:        Attention and Perception: She is inattentive.        Mood and Affect: Mood normal. Affect is labile and angry.        Behavior: Behavior is agitated, aggressive, hyperactive and combative.        Cognition and Memory: Cognition is not impaired.      ED Course/ Medical Decision Making/ A&P    Procedures .Critical  Care  Performed by: Jerral Meth, MD Authorized by: Jerral Meth, MD   Critical care provider statement:    Critical care time (minutes):  45   Critical care was time spent personally by me on the following activities:  Development of treatment plan with patient or surrogate, discussions with consultants, evaluation of patient's response to treatment, examination of patient, ordering and review of laboratory studies, ordering and review of radiographic studies, ordering and performing treatments and interventions, pulse oximetry, re-evaluation of patient's condition and review of old charts   Care discussed with: admitting provider      Medications Ordered in ED Medications  ziprasidone  (GEODON ) injection 20 mg (has no administration in time range)  sterile water  (preservative free) injection (has no administration in time range)   Medical Decision Making:   [name] [name] is a 38 y.o. female who presented to the ED today for psychiatric evaluation.  Patient does have a history of similar for which they are on multiple medications.  They are historically not compliant with their medications.  On my initial exam, the pt was screaming and aggressive, disrobed and completely tangential.  Vital signs reviewed and reassuring.  Reviewed and confirmed nursing documentation for past medical history, family history, social history.   Patient placed on continuous vitals  and telemetry monitoring while in ED which was reviewed periodically.     Initial Assessment:   This is most consistent with an acute life threatening illness. With the patient's presentation of psychosis, patient warrants emergent psychiatric consultation.  Differential includes primary psychosis, substance-induced psychosis, mood disturbance.  Initial Plan:  Patient immediately placed into ED psychiatric hold protocol including suicide precautions, elopement precautions and vital sign monitoring.    Emergent  behavioral health hold signed and notarized while awaiting psychiatric consultation due to threat to self or others.  Psychiatry consulted for further evaluation once patient medically cleared.  Medical screening evaluation ordered and reviewed with no obvious medical reason to postpone psychiatric evaluation.   Patient involuntarily committed due to limitations in their capacity due to the severity of their illness.  IVC and first exam completed and submitted to secretaries for appropriate filing per departmental protocol.  Final Assessment and Plan:   Patient still swinging at staff on arrival. Has just received 5mg  of versed  and 5 of haldol  and still screaming. Placed into restraint as she is being aggressive and trying to bite. Geodon  ordered for establishment of safe care environment.  Will need to be observed for metabolism of the medication prior to psychiatric clearance.     Clinical Impression:  1. Psychosis, unspecified psychosis type ([name])      Data Unavailable   Final Clinical Impression(s) / ED Diagnoses Final diagnoses:  Psychosis, unspecified psychosis type [facility])    Rx / DC Orders ED Discharge Orders     None         Jerral Meth, MD 11/20/23 [phone]

## 2023-11-20 NOTE — ED Provider Notes (Signed)
  [place] EMERGENCY DEPARTMENT AT [facility] Provider Assume Care Note I assumed care of [name] [name] on 11/20/2023 at 3:30 PM from Dr. Jackquline.   Briefly, [name] [name] is a 38 y.o. female who: PMHx: Polysubstance use disorder, bipolar disorder P/w manic behavior, reportedly threatening police with gun, was agitated on arrival requiring medications Under IVC, this morning was calm and cooperative, awaiting psychiatry recommendations  Plan at the time of handoff: Follow-up psychiatry recommendations   Please refer to the original provider's note for additional information regarding the care of [name] [name].  Reassessment: I personally reassessed the patient: Patient without any acute complaints or additional questions.  Vital Signs:  ED Triage Vitals  Encounter Vitals Group     BP 11/20/23 0709 (!) 104/52     Girls Systolic BP Percentile --      Girls Diastolic BP Percentile --      Boys Systolic BP Percentile --      Boys Diastolic BP Percentile --      Pulse Rate 11/20/23 0709 72     Resp 11/20/23 0709 16     Temp 11/20/23 0709 98.6 F (37 C)     Temp Source 11/20/23 0709 Oral     SpO2 11/20/23 0709 100 %     Weight --      Height --      Head Circumference --      Peak Flow --      Pain Score 11/20/23 0710 0     Pain Loc --      Pain Education --      Exclude from Growth Chart --      Hemodynamics:  The patient is hemodynamically stable. Mental Status:  The patient is alert  Additional MDM: On reevaluation patient linear, goal-directed, no SI, HI.  Patient was evaluated by Dr. Pearlene with psychiatry, who felt the patient likely experienced exacerbation of a mood disorder in the setting of substance-induced psychosis, feels that patient is currently appropriate for outpatient management.  Reports that he spoke to the patient's husband to is comfortable with this plan.  Given my bedside evaluation, I believe that this  is reasonable.  I have rescinded the patient's IVC.  Patient discharged.  Disposition: DISCHARGE: I believe that the patient is safe for discharge home with outpatient follow-up. Patient was informed of all pertinent physical exam, laboratory, and imaging findings. Patient's suspected etiology of their symptom presentation was discussed with the patient and all questions were answered. We discussed following up with PCP. I provided thorough ED return precautions. The patient feels safe and comfortable with this plan.   FREDRIK CANDIE Later, MD Emergency Medicine    Later Jerilynn RAMAN, MD 11/20/23 2040

## 2023-11-20 NOTE — ED Notes (Signed)
 IVC paperwork moved to purple zone in purple clipboard

## 2023-11-20 NOTE — ED Notes (Signed)
Patient is tearful.

## 2023-11-20 NOTE — ED Notes (Signed)
 IVC case number: [id number]

## 2023-11-20 NOTE — ED Notes (Signed)
IVC is rescinded.

## 2023-11-20 NOTE — BH Assessment (Signed)
 [facility] to see at 1630, Ryan from [employer] to assist with coordinating.

## 2023-11-20 NOTE — ED Notes (Signed)
Patient took a shower.

## 2023-11-20 NOTE — Discharge Instructions (Addendum)
 Sareen [employer]  Thank you for allowing us  to take care of you today.  You came to the Emergency Department today because you had some agitation when you initially came to the emergency department after an altercation with police.  You came in and you were agitated and we gave medication to help you calm down.  Your urine was positive for cocaine, THC, amphetamines and benzodiazepines.  Recreational substances like these can cause agitation and make your mental health worse.  We recommend against using these medications.  You are now feeling better, and psychiatry saw you and also feels that you are doing better and stable for discharge, you are likely doing better because you have metabolized the recreational substances that were in your system.  We recommend against further recreational substance use.  To-Do: 1. Please follow-up with your primary doctor within 1 to 2 weeks/ as soon as possible.   Please return to the Emergency Department or call 911 if you experience have worsening of your symptoms, or do not get better, chest pain, shortness of breath, severe or significantly worsening pain, high fever, severe confusion, pass out or have any reason to think that you need emergency medical care.   We hope you feel better soon.   Department of Emergency Medicine [facility]

## 2023-11-20 NOTE — ED Provider Notes (Signed)
 Emergency Medicine Observation Re-evaluation Note  [name] [name] is a 38 y.o. female, seen on rounds today.  Pt initially presented to the ED for complaints of Manic Behavior (Pt brought in by EMS and PD after being found in a field acting belligerent. Pt also had a concealed fire arm that was waved at PD during the arrest. Pt was brought in wearing 4 point restraints yelling and screaming at staff.) Currently, the patient is resting.SABRA  Physical Exam  BP (!) 104/52 (BP Location: Right Arm)   Pulse 72   Temp 98.6 F (37 C) (Oral)   Resp 16   SpO2 100%  Physical Exam General: NAD, resting in bed.  Cardiac: RRR Lungs: unlabored  Psych: Unable to examine  ED Course / MDM  EKG:   I have reviewed the labs performed to date as well as medications administered while in observation.  Recent changes in the last 24 hours include none.  Plan  Patient was signed out to me.  Patient arrived manic.  Had to receive chemical sedation before coming to the ED.  Also had to be in soft restraints due to agitation and aggression to try to bite and hit staff.  Currently, a lot more sedated and calm at this moment of time.  Patient has been IVC.  Paperwork has been completed.  Laboratory workup shows small leukocytosis.  Think this is likely due to agitation.  No clear infectious pathology.  Soft and benign abdomen.  No concern for intra-abdominal pathology.  Could also be drug related given her multiple positive drug screen on urine.  EKG shows no STEMI.   Patient medically clear.  Pending psychiatric consultation.   Simon Lavonia SAILOR, MD 11/20/23 1500

## 2023-11-20 NOTE — Consult Note (Signed)
 [facility] Consult Note  Patient Name: [name] [name] MRN: [id number] DOB: November 04, 1985 DATE OF Consult: 11/20/2023  PRIMARY PSYCHIATRIC DIAGNOSES  1.  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  Polysubstance dependence 3.  Cluster B traits r/o BPD  RECOMMENDATIONS  Recommendations: Medication recommendations: resume home medications Non-Medication/therapeutic recommendations: f/u with outpatient [facility]-plan to walk-in 9/22 AM, pt. And her children's father agreed to return/assist in her returning to ER if she demonstrates any change in behavior especially disorganized thought processes, delusions. Is inpatient psychiatric hospitalization recommended for this patient? No (Explain why): pt. not currently demonstrating signs/symptoms of psychosis.  She has underlying mental illness exacerbated by substance use, several similar presentations in the past.  Current presentation likely largely related to substance use given acute resolution with time and antipsychotic administration.  She is not interested in voluntary hospitalization at this time but is aware that this is an option for her at any time.  Given current presentation and likely etiology case for involuntary commitment is relatively weak and could undermine outpatient therapeutic alliance. Is another care setting recommended for this patient? (examples may include Crisis Stabilization Unit, Residential/Recovery Treatment, ALF/SNF, Memory Care Unit)  No (Explain why):   From a psychiatric perspective, is this patient appropriate for discharge to an outpatient setting/resource or other less restrictive environment for continued care?  Yes (Explain why): pt. is well-connected to outpatient services and able to engage in safety and disposition planning Follow-Up Telepsychiatry C/L services: We will sign off for now. Please re-consult our service if needed for any concerning changes  in the patient's condition, discharge planning, or questions. Communication: Treatment team members (and family members if applicable) who were involved in treatment/care discussions and planning, and with whom we spoke or engaged with via secure text/chat, include the following: treatment team via secure Epic chat, ER attending via phone.  Thank you for involving us  in the care of this patient. If you have any additional questions or concerns, please call [phone] and ask for me or the provider on-call.  TELEPSYCHIATRY ATTESTATION & CONSENT  As the provider for this telehealth consult, I attest that I verified the patient's identity using two separate identifiers, introduced myself to the patient, provided my credentials, disclosed my location, and performed this encounter via a HIPAA-compliant, real-time, face-to-face, two-way, interactive audio and video platform and with the full consent and agreement of the patient (or guardian as applicable.)  Patient physical location: ED at [facility] provider physical location: home office in state of [place]  Video start time: 1535 (Central Time) Video end time: 1606 (Central Time)  IDENTIFYING DATA  [name] [name] is a 38 y.o. year-old female for whom a psychiatric consultation has been ordered by the primary provider. The patient was identified using two separate identifiers.  CHIEF COMPLAINT/REASON FOR CONSULT  Disorganized behavior  HISTORY OF PRESENT ILLNESS (HPI)  The patient is a 38 y/o BF brought in by EMS overnight after apparently acting erratically last night.  She reports that she did not pull her gun on POs but rather handed it over calmly.  Upon presentation to ER she was agitated and required prn antipsychotic medication.  Since this morning she has been calm and cooperative with staff.  She admits to disorganized behavior last night and attributes this to substance use.  She presents coherently and  calmly and is alert and fully-oriented.  She is contrite about recent events and demonstrates concern for her children's  well-being.  Reports she is co-parenting with her children's father but they are not currently living together, reports she is well-connected with outpatient mental health and reports that she has underlying mental health issues either PTSD or bipolar.  She readily provided contact information for collateral sources, chiefly her children's father.  Spoke to him via phone and he corroborated her version of recent events, reports that he sees her daily knows what to look for (delusional behavior) when she tends to decompensate but denies seeing any warning signs in the last two days and believes substance use acutely exacerbated her symptoms.  He notes this has happened multiple times in the past and sometimes led to psychiatric hospitalization.  He reports she was sleeping well and did not appear manic or psychotic until she apparently used cocaine and other substances.  He corroborates that she has good outpatient connection and both he and pt. agreed to return to ER with any increase in symptoms.  Discussed voluntary hospitalization with her and she deferred at this time.  She denies S/H ideation or A/V hallucinations and has no other concerns at this time.  PAST PSYCHIATRIC HISTORY  She reports she has been inpatient multiple times, most recently approximately one year ago when she entered on a voluntary basis due to familial stressors.  She reports hx of seroquel , depakote , multiple other medication trials that she notes were ineffective.  She reports she has been diagnosed with PTSD, bipolar disorder/schizoaffective disorder and has been taking medication since age 43.  She agrees that she does have mental illness.  She reports one suicide attempt at age 5 by taking overdose of her mother's pills.  Hx of self-cutting her thigh as a teenager.  Reports she is well-connected to outpatient  services and typically walks-in at least once per month.  She reports good compliance with f/u as she links this mentally to being able to care for her 39 y/o and 39 y/o twins who are currently with their father.  She reports she currently lives alone with her children, receives SSI.   Otherwise as per HPI above.  PAST MEDICAL HISTORY  Past Medical History:  Diagnosis Date   Anxiety    Bipolar disorder ([name])    Boils    Headache(784.0)    PCOS (polycystic ovarian syndrome) 02/26/2012   Recurrent UTI 05/15/2013   Neg 03/2020  E. Coli   Smoker 01/18/2019   Status post primary low transverse cesarean section 01/06/2022     HOME MEDICATIONS  Facility Ordered Medications  Medication   ziprasidone  (GEODON ) injection 20 mg   sterile water  (preservative free) injection   hydrOXYzine  (ATARAX ) tablet 50 mg   lamoTRIgine  (LAMICTAL ) tablet 25 mg   OLANZapine  (ZYPREXA ) tablet 5 mg   traZODone  (DESYREL ) tablet 100 mg   PTA Medications  Medication Sig   phenazopyridine  (PYRIDIUM ) 200 MG tablet Take 1 tablet (200 mg total) by mouth 3 (three) times daily as needed for pain.   traZODone  (DESYREL ) 100 MG tablet Take 1 tablet (100 mg total) by mouth at bedtime as needed for sleep.   lamoTRIgine  (LAMICTAL ) 25 MG tablet Take 1 tablet (25 mg total) by mouth 2 (two) times daily.   lamoTRIgine  (LAMICTAL ) 25 MG tablet Take 1 tablet (25 mg total) by mouth daily.   OLANZapine  (ZYPREXA ) 5 MG tablet Take 1 tablet (5 mg total) by mouth in the morning and at bedtime.   hydrOXYzine  (ATARAX ) 50 MG tablet Take 1 tablet (50 mg total) by mouth daily as  needed.     ALLERGIES  Allergies  Allergen Reactions   Ibuprofen  Nausea And Vomiting    Stomach upset    SOCIAL & SUBSTANCE USE HISTORY  Social History   Socioeconomic History   Marital status: Single    Spouse name: Not on file   Number of children: Not on file   Years of education: Not on file   Highest education level: Not on file  Occupational History    Not on file  Tobacco Use   Smoking status: Every Day    Current packs/day: 0.25    Types: Cigarettes   Smokeless tobacco: Never  Vaping Use   Vaping status: Former   Substances: Nicotine , THC  Substance and Sexual Activity   Alcohol use: Yes    Comment: wine cooler a day   Drug use: Yes    Types: Marijuana, Cocaine    Comment: last used 10/31 cocaine. Marijuana use everyday.   Sexual activity: Not Currently    Birth control/protection: None  Other Topics Concern   Not on file  Social History Narrative   Not on file   Social Drivers of Health   Financial Resource Strain: Not on file  Food Insecurity: No Food Insecurity (01/08/2022)   Hunger Vital Sign    Worried About Running Out of Food in the Last Year: Never true    Ran Out of Food in the Last Year: Never true  Transportation Needs: No Transportation Needs (01/08/2022)   PRAPARE - Administrator, Civil Service (Medical): No    Lack of Transportation (Non-Medical): No  Physical Activity: Not on file  Stress: Not on file  Social Connections: Unknown (07/04/2021)   Received from [facility]   Social Network    Social Network: Not on file   Social History   Tobacco Use  Smoking Status Every Day   Current packs/day: 0.25   Types: Cigarettes  Smokeless Tobacco Never   Social History   Substance and Sexual Activity  Alcohol Use Yes   Comment: wine cooler a day   Social History   Substance and Sexual Activity  Drug Use Yes   Types: Marijuana, Cocaine   Comment: last used 10/31 cocaine. Marijuana use everyday.    Additional pertinent information: pt. Reports she uses cannabis daily but denies other substance use of any kind except 5-20 cigarettes daily.  When asked about her positive UDS for cocaine, amphetamines as well as cannabis she admits reluctantly that she partied with my friends last weekend.    FAMILY HISTORY  Family History  Problem Relation Age of Onset   Mental illness Mother     Thyroid  disease Mother    Asthma Sister    Asthma Brother    Alcohol abuse Brother    Cancer Paternal Aunt    Breast cancer Paternal Aunt    Hypertension Maternal Grandmother    Birth defects Neg Hx    Diabetes Neg Hx    Heart disease Neg Hx    Stroke Neg Hx    Family Psychiatric History (if known): reports both parents have PTSD.  Denies family history of suicides.  MENTAL STATUS EXAM (MSE)  Mental Status Exam: General Appearance: Neat and Well Groomed  Orientation:  Full (Time, Place, and Person)  Memory:  Immediate;   Good Recent;   Good Remote;   Good  Concentration:  Concentration: Good and Attention Span: Good  Recall:  Good  Attention  Good  Eye Contact:  Good  Speech:  Normal Rate  Language:  Good  Volume:  Normal  Mood: mildly anxious  Affect:  Congruent  Thought Process:  Coherent  Thought Content:  Logical  Suicidal Thoughts:  No  Homicidal Thoughts:  No  Judgement:  Fair  Insight:  Fair  Psychomotor Activity:  Normal  Akathisia:  No  Fund of Knowledge:  Good    Assets:  Communication Skills Desire for Improvement Housing Physical Health Social Support  Cognition:  WNL  ADL's:  Intact  AIMS (if indicated):       VITALS  Blood pressure (!) 141/87, pulse 80, temperature 98.3 F (36.8 C), temperature source Oral, resp. rate (!) 21, SpO2 100%, not currently breastfeeding.  LABS  Admission on 11/20/2023  Component Date Value Ref Range Status   Opiates 11/20/2023 NONE DETECTED  NONE DETECTED Final   Cocaine 11/20/2023 POSITIVE (A)  NONE DETECTED Final   Benzodiazepines 11/20/2023 POSITIVE (A)  NONE DETECTED Final   Amphetamines 11/20/2023 POSITIVE (A)  NONE DETECTED Final   Comment: (NOTE) Trazodone  is metabolized in vivo to several metabolites, including pharmacologically active m-CPP, which is excreted in the urine. Immunoassay screens for amphetamines and MDMA have potential cross-reactivity with these compounds and may provide false positive   results.     Tetrahydrocannabinol 11/20/2023 POSITIVE (A)  NONE DETECTED Final   Barbiturates 11/20/2023 NONE DETECTED  NONE DETECTED Final   Comment: (NOTE) DRUG SCREEN FOR MEDICAL PURPOSES ONLY.  IF CONFIRMATION IS NEEDED FOR ANY PURPOSE, NOTIFY LAB WITHIN 5 DAYS.  LOWEST DETECTABLE LIMITS FOR URINE DRUG SCREEN Drug Class                     Cutoff (ng/mL) Amphetamine and metabolites    1000 Barbiturate and metabolites    200 Benzodiazepine                 200 Opiates and metabolites        300 Cocaine and metabolites        300 THC                            50 Performed at [facility], [address] [place], [place] [address]    Preg Test, Ur 11/20/2023 NEGATIVE  NEGATIVE Final   Comment:        THE SENSITIVITY OF THIS METHODOLOGY IS >20 mIU/mL. Performed at [facility], [address], [place], [place] [address]    WBC 11/20/2023 16.8 (H)  4.0 - 10.5 K/uL Final   RBC 11/20/2023 4.19  3.87 - 5.11 MIL/uL Final   Hemoglobin 11/20/2023 13.5  12.0 - 15.0 g/dL Final   HCT 90/80/7974 39.8  36.0 - 46.0 % Final   MCV 11/20/2023 95.0  80.0 - 100.0 fL Final   MCH 11/20/2023 32.2  26.0 - 34.0 pg Final   MCHC 11/20/2023 33.9  30.0 - 36.0 g/dL Final   RDW 90/80/7974 12.8  11.5 - 15.5 % Final   Platelets 11/20/2023 332  150 - 400 K/uL Final   nRBC 11/20/2023 0.0  0.0 - 0.2 % Final   Neutrophils Relative % 11/20/2023 79  % Final   Neutro Abs 11/20/2023 13.4 (H)  1.7 - 7.7 K/uL Final   Lymphocytes Relative 11/20/2023 11  % Final   Lymphs Abs 11/20/2023 1.8  0.7 - 4.0 K/uL Final   Monocytes Relative 11/20/2023 8  % Final   Monocytes Absolute 11/20/2023 1.3 (  H)  0.1 - 1.0 K/uL Final   Eosinophils Relative 11/20/2023 1  % Final   Eosinophils Absolute 11/20/2023 0.2  0.0 - 0.5 K/uL Final   Basophils Relative 11/20/2023 0  % Final   Basophils Absolute 11/20/2023 0.0  0.0 - 0.1 K/uL Final   Immature Granulocytes 11/20/2023 1  % Final   Abs Immature Granulocytes  11/20/2023 0.08 (H)  0.00 - 0.07 K/uL Final   Performed at [facility], [address], [place], [place] [address]   Sodium 11/20/2023 139  135 - 145 mmol/L Final   Potassium 11/20/2023 3.7  3.5 - 5.1 mmol/L Final   Chloride 11/20/2023 107  98 - 111 mmol/L Final   CO2 11/20/2023 22  22 - 32 mmol/L Final   Glucose, Bld 11/20/2023 123 (H)  70 - 99 mg/dL Final   Glucose reference range applies only to samples taken after fasting for at least 8 hours.   BUN 11/20/2023 10  6 - 20 mg/dL Final   Creatinine, Ser 11/20/2023 0.95  0.44 - 1.00 mg/dL Final   Calcium  11/20/2023 9.4  8.9 - 10.3 mg/dL Final   Total Protein 90/80/7974 7.9  6.5 - 8.1 g/dL Final   Albumin 90/80/7974 4.4  3.5 - 5.0 g/dL Final   AST 90/80/7974 30  15 - 41 U/L Final   ALT 11/20/2023 30  0 - 44 U/L Final   Alkaline Phosphatase 11/20/2023 82  38 - 126 U/L Final   Total Bilirubin 11/20/2023 0.9  0.0 - 1.2 mg/dL Final   GFR, Estimated 11/20/2023 >60  >60 mL/min Final   Comment: (NOTE) Calculated using the CKD-EPI Creatinine Equation (2021)    Anion gap 11/20/2023 10  5 - 15 Final   Performed at [facility], [address] [place], [place] [address]   Salicylate Lvl 11/20/2023 <7.0 (L)  7.0 - 30.0 mg/dL Final   Performed at [facility], [address], [place], [place] [address]   Acetaminophen  (Tylenol ), Serum 11/20/2023 <10 (L)  10 - 30 ug/mL Final   Comment: (NOTE) Therapeutic concentrations vary significantly. A range of 10-30 ug/mL  may be an effective concentration for many patients. However, some  are best treated at concentrations outside of this range. Acetaminophen  concentrations >150 ug/mL at 4 hours after ingestion  and >50 ug/mL at 12 hours after ingestion are often associated with  toxic reactions.  Performed at [facility], [address], [place], [place] [address]    Color, Urine 11/20/2023 AMBER (A)  YELLOW Final   BIOCHEMICALS MAY BE AFFECTED BY COLOR   APPearance  11/20/2023 HAZY (A)  CLEAR Final   Specific Gravity, Urine 11/20/2023 1.025  1.005 - 1.030 Final   pH 11/20/2023 5.0  5.0 - 8.0 Final   Glucose, UA 11/20/2023 NEGATIVE  NEGATIVE mg/dL Final   Hgb urine dipstick 11/20/2023 NEGATIVE  NEGATIVE Final   Bilirubin Urine 11/20/2023 NEGATIVE  NEGATIVE Final   Ketones, ur 11/20/2023 5 (A)  NEGATIVE mg/dL Final   Protein, ur 90/80/7974 30 (A)  NEGATIVE mg/dL Final   Nitrite 90/80/7974 NEGATIVE  NEGATIVE Final   Leukocytes,Ua 11/20/2023 NEGATIVE  NEGATIVE Final   RBC / HPF 11/20/2023 0-5  0 - 5 RBC/hpf Final   WBC, UA 11/20/2023 0-5  0 - 5 WBC/hpf Final   Bacteria, UA 11/20/2023 RARE (A)  NONE SEEN Final   Squamous Epithelial / HPF 11/20/2023 0-5  0 - 5 /HPF Final   Mucus 11/20/2023  PRESENT   Final   Hyaline Casts, UA 11/20/2023 PRESENT   Final   Amorphous Crystal 11/20/2023 PRESENT   Final   Performed at [facility], [address], [place], [place] [address]    PSYCHIATRIC REVIEW OF SYSTEMS (ROS)  ROS: Notable for the following relevant positive findings: ROS  Additional findings:      Musculoskeletal: No abnormal movements observed      Gait & Station: Normal      Pain Screening: Denies      Nutrition & Dental Concerns: none noted  RISK FORMULATION/ASSESSMENT  Is the patient experiencing any suicidal or homicidal ideations: No      Protective factors considered for safety management: social/family support, good response to medications, well-connected to outpatient services  Risk factors/concerns considered for safety management:  Prior attempt Substance abuse/dependence Unmarried  Is there a safety management plan with the patient and treatment team to minimize risk factors and promote protective factors: Yes           Explain: pharmacotherapy, resume outpatient treatment Is crisis care placement or psychiatric hospitalization recommended: No     Based on my current evaluation and risk assessment, patient is determined at  this time to be at:  Moderate Risk  *RISK ASSESSMENT Risk assessment is a dynamic process; it is possible that this patient's condition, and risk level, may change. This should be re-evaluated and managed over time as appropriate. Please re-consult psychiatric consult services if additional assistance is needed in terms of risk assessment and management. If your team decides to discharge this patient, please advise the patient how to best access emergency psychiatric services, or to call 911, if their condition worsens or they feel unsafe in any way.   Bernardino LITTIE Erm, MD Telepsychiatry Consult Services

## 2024-03-02 ENCOUNTER — Ambulatory Visit: Payer: Self-pay

## 2024-03-10 ENCOUNTER — Ambulatory Visit: Payer: MEDICAID

## 2024-03-17 ENCOUNTER — Other Ambulatory Visit (HOSPITAL_COMMUNITY)
Admission: RE | Admit: 2024-03-17 | Discharge: 2024-03-17 | Disposition: A | Discharge status: Home / Self Care | Payer: MEDICAID | Source: Ambulatory Visit | Attending: Family Medicine | Admitting: Family Medicine

## 2024-03-17 ENCOUNTER — Ambulatory Visit (INDEPENDENT_AMBULATORY_CARE_PROVIDER_SITE_OTHER): Payer: Self-pay | Admitting: *Deleted

## 2024-03-17 ENCOUNTER — Other Ambulatory Visit: Payer: Self-pay

## 2024-03-17 VITALS — BP 95/67 | HR 92 | Ht 71.0 in | Wt 229.0 lb

## 2024-03-17 DIAGNOSIS — Z202 Contact with and (suspected) exposure to infections with a predominantly sexual mode of transmission: Secondary | ICD-10-CM

## 2024-03-17 DIAGNOSIS — R300 Dysuria: Secondary | ICD-10-CM

## 2024-03-17 LAB — POCT URINALYSIS DIP (DEVICE)
Bilirubin Urine: NEGATIVE
Glucose, UA: NEGATIVE mg/dL
Hgb urine dipstick: NEGATIVE
Ketones, ur: NEGATIVE mg/dL
Leukocytes,Ua: NEGATIVE
Nitrite: NEGATIVE
Protein, ur: NEGATIVE mg/dL
Specific Gravity, Urine: 1.02 (ref 1.005–1.030)
Urobilinogen, UA: 0.2 mg/dL (ref 0.0–1.0)
pH: 7.5 (ref 5.0–8.0)

## 2024-03-17 NOTE — Progress Notes (Signed)
 Here for self swab for c/o vaginal irritation. States she is worried her partner may have exposed her to STD.  Also c/o burning sometimes when urinates. Will do self swab and urinalysis . UA wnl. Self swab collected. Explained if positive results she will be contacted with results and plan of care. She voices understanding. Rock Skip PEAK

## 2024-03-18 LAB — CERVICOVAGINAL ANCILLARY ONLY
Bacterial Vaginitis (gardnerella): POSITIVE — AB
Candida Glabrata: NEGATIVE
Candida Vaginitis: NEGATIVE
Chlamydia: NEGATIVE
Comment: NEGATIVE
Comment: NEGATIVE
Comment: NEGATIVE
Comment: NEGATIVE
Comment: NEGATIVE
Comment: NORMAL
Neisseria Gonorrhea: NEGATIVE
Trichomonas: NEGATIVE

## 2024-03-20 ENCOUNTER — Ambulatory Visit: Payer: Self-pay | Admitting: Family Medicine

## 2024-03-20 DIAGNOSIS — B9689 Other specified bacterial agents as the cause of diseases classified elsewhere: Secondary | ICD-10-CM

## 2024-03-20 MED ORDER — METRONIDAZOLE 0.75 % VA GEL: 1.0000 | Freq: Every day | VAGINAL | 0 refills | Status: AC
